# Patient Record
Sex: Female | Born: 1945 | Race: Black or African American | Hispanic: No | Smoking: Former smoker
Health system: Southern US, Community
[De-identification: ages and names within clinical notes are randomized; demographics above are authoritative.]

## PROBLEM LIST (undated history)

## (undated) DIAGNOSIS — S638X2A Sprain of other part of left wrist and hand, initial encounter: Secondary | ICD-10-CM

## (undated) DIAGNOSIS — M25461 Effusion, right knee: Secondary | ICD-10-CM

## (undated) DIAGNOSIS — R630 Anorexia: Secondary | ICD-10-CM

## (undated) DIAGNOSIS — M7989 Other specified soft tissue disorders: Secondary | ICD-10-CM

## (undated) DIAGNOSIS — A809 Acute poliomyelitis, unspecified: Secondary | ICD-10-CM

## (undated) DIAGNOSIS — W19XXXA Unspecified fall, initial encounter: Secondary | ICD-10-CM

## (undated) DIAGNOSIS — M25569 Pain in unspecified knee: Secondary | ICD-10-CM

## (undated) DIAGNOSIS — D649 Anemia, unspecified: Secondary | ICD-10-CM

## (undated) DIAGNOSIS — I10 Essential (primary) hypertension: Secondary | ICD-10-CM

## (undated) DIAGNOSIS — L0390 Cellulitis, unspecified: Secondary | ICD-10-CM

## (undated) DIAGNOSIS — R9431 Abnormal electrocardiogram [ECG] [EKG]: Secondary | ICD-10-CM

## (undated) DIAGNOSIS — I872 Venous insufficiency (chronic) (peripheral): Secondary | ICD-10-CM

## (undated) DIAGNOSIS — D509 Iron deficiency anemia, unspecified: Secondary | ICD-10-CM

## (undated) DIAGNOSIS — S5290XA Unspecified fracture of unspecified forearm, initial encounter for closed fracture: Secondary | ICD-10-CM

## (undated) DIAGNOSIS — I309 Acute pericarditis, unspecified: Secondary | ICD-10-CM

## (undated) DIAGNOSIS — R4189 Other symptoms and signs involving cognitive functions and awareness: Secondary | ICD-10-CM

## (undated) DIAGNOSIS — R221 Localized swelling, mass and lump, neck: Secondary | ICD-10-CM

## (undated) DIAGNOSIS — G8929 Other chronic pain: Secondary | ICD-10-CM

## (undated) DIAGNOSIS — S52509A Unspecified fracture of the lower end of unspecified radius, initial encounter for closed fracture: Secondary | ICD-10-CM

## (undated) DIAGNOSIS — M1990 Unspecified osteoarthritis, unspecified site: Secondary | ICD-10-CM

## (undated) DIAGNOSIS — R600 Localized edema: Secondary | ICD-10-CM

## (undated) DIAGNOSIS — E43 Unspecified severe protein-calorie malnutrition: Secondary | ICD-10-CM

## (undated) DIAGNOSIS — E119 Type 2 diabetes mellitus without complications: Secondary | ICD-10-CM

## (undated) DIAGNOSIS — S52613A Displaced fracture of unspecified ulna styloid process, initial encounter for closed fracture: Secondary | ICD-10-CM

## (undated) DIAGNOSIS — E162 Hypoglycemia, unspecified: Secondary | ICD-10-CM

## (undated) HISTORY — DX: Localized edema: R60.0

## (undated) HISTORY — DX: Iron deficiency anemia, unspecified: D50.9

## (undated) HISTORY — DX: Sprain of other part of left wrist and hand, initial encounter: S63.8X2A

## (undated) HISTORY — DX: Anemia, unspecified: D64.9

## (undated) HISTORY — DX: Acute pericarditis, unspecified: I30.9

## (undated) HISTORY — DX: Unspecified fracture of unspecified forearm, initial encounter for closed fracture: S52.90XA

## (undated) HISTORY — DX: Unspecified fracture of the lower end of unspecified radius, initial encounter for closed fracture: S52.509A

## (undated) HISTORY — DX: Abnormal electrocardiogram (ECG) (EKG): R94.31

## (undated) HISTORY — DX: Venous insufficiency (chronic) (peripheral): I87.2

## (undated) HISTORY — DX: Unspecified fall, initial encounter: W19.XXXA

## (undated) HISTORY — DX: Pain in unspecified knee: M25.569

## (undated) HISTORY — DX: Unspecified osteoarthritis, unspecified site: M19.90

## (undated) HISTORY — DX: Anorexia: R63.0

## (undated) HISTORY — DX: Type 2 diabetes mellitus without complications: E11.9

## (undated) HISTORY — DX: Other specified soft tissue disorders: M79.89

## (undated) HISTORY — DX: Displaced fracture of unspecified ulna styloid process, initial encounter for closed fracture: S52.613A

## (undated) HISTORY — DX: Unspecified severe protein-calorie malnutrition: E43

## (undated) HISTORY — DX: Hypoglycemia, unspecified: E16.2

## (undated) HISTORY — DX: Localized swelling, mass and lump, neck: R22.1

## (undated) HISTORY — DX: Effusion, right knee: M25.461

## (undated) HISTORY — DX: Other chronic pain: G89.29

## (undated) HISTORY — DX: Cellulitis, unspecified: L03.90

## (undated) HISTORY — PX: ABDOMINAL HYSTERECTOMY: SHX81

## (undated) HISTORY — DX: Other symptoms and signs involving cognitive functions and awareness: R41.89

---

## 1998-02-28 ENCOUNTER — Encounter: Admission: RE | Admit: 1998-02-28 | Discharge: 1998-05-29 | Payer: Self-pay | Admitting: Anesthesiology

## 1998-06-06 ENCOUNTER — Ambulatory Visit (HOSPITAL_COMMUNITY): Admission: RE | Admit: 1998-06-06 | Discharge: 1998-06-06 | Payer: Self-pay | Admitting: *Deleted

## 1999-06-26 ENCOUNTER — Ambulatory Visit: Admission: RE | Admit: 1999-06-26 | Discharge: 1999-06-26 | Payer: Self-pay | Admitting: Emergency Medicine

## 1999-06-26 ENCOUNTER — Emergency Department (HOSPITAL_COMMUNITY): Admission: EM | Admit: 1999-06-26 | Discharge: 1999-06-26 | Payer: Self-pay | Admitting: Emergency Medicine

## 2005-05-26 ENCOUNTER — Emergency Department (HOSPITAL_COMMUNITY): Admission: EM | Admit: 2005-05-26 | Discharge: 2005-05-26 | Payer: Self-pay | Admitting: Family Medicine

## 2006-01-10 ENCOUNTER — Encounter: Admission: RE | Admit: 2006-01-10 | Discharge: 2006-01-10 | Payer: Self-pay | Admitting: Otolaryngology

## 2008-02-18 ENCOUNTER — Encounter
Admission: RE | Admit: 2008-02-18 | Discharge: 2008-02-18 | Payer: Self-pay | Admitting: Physical Medicine and Rehabilitation

## 2008-11-13 ENCOUNTER — Emergency Department (HOSPITAL_COMMUNITY): Admission: EM | Admit: 2008-11-13 | Discharge: 2008-11-13 | Payer: Self-pay | Admitting: Emergency Medicine

## 2008-12-30 ENCOUNTER — Encounter (HOSPITAL_BASED_OUTPATIENT_CLINIC_OR_DEPARTMENT_OTHER): Admission: RE | Admit: 2008-12-30 | Discharge: 2009-02-10 | Payer: Self-pay | Admitting: General Surgery

## 2009-01-04 ENCOUNTER — Ambulatory Visit: Payer: Self-pay | Admitting: Vascular Surgery

## 2009-02-17 ENCOUNTER — Emergency Department (HOSPITAL_COMMUNITY): Admission: EM | Admit: 2009-02-17 | Discharge: 2009-02-17 | Payer: Self-pay | Admitting: Emergency Medicine

## 2010-09-30 ENCOUNTER — Encounter: Payer: Self-pay | Admitting: Otolaryngology

## 2010-11-16 ENCOUNTER — Encounter (HOSPITAL_BASED_OUTPATIENT_CLINIC_OR_DEPARTMENT_OTHER): Payer: Medicare Other | Attending: General Surgery

## 2010-11-16 DIAGNOSIS — J301 Allergic rhinitis due to pollen: Secondary | ICD-10-CM | POA: Insufficient documentation

## 2010-11-16 DIAGNOSIS — E119 Type 2 diabetes mellitus without complications: Secondary | ICD-10-CM | POA: Insufficient documentation

## 2010-11-16 DIAGNOSIS — I10 Essential (primary) hypertension: Secondary | ICD-10-CM | POA: Insufficient documentation

## 2010-11-16 DIAGNOSIS — Z79899 Other long term (current) drug therapy: Secondary | ICD-10-CM | POA: Insufficient documentation

## 2010-11-16 DIAGNOSIS — Z86718 Personal history of other venous thrombosis and embolism: Secondary | ICD-10-CM | POA: Insufficient documentation

## 2010-11-16 DIAGNOSIS — M1710 Unilateral primary osteoarthritis, unspecified knee: Secondary | ICD-10-CM | POA: Insufficient documentation

## 2010-11-16 DIAGNOSIS — L97209 Non-pressure chronic ulcer of unspecified calf with unspecified severity: Secondary | ICD-10-CM | POA: Insufficient documentation

## 2010-11-21 ENCOUNTER — Encounter (HOSPITAL_COMMUNITY)
Admission: RE | Admit: 2010-11-21 | Discharge: 2010-11-21 | Disposition: A | Discharge status: Home / Self Care | Payer: Medicare Other | Source: Ambulatory Visit | Attending: General Surgery | Admitting: General Surgery

## 2010-11-23 ENCOUNTER — Encounter (INDEPENDENT_AMBULATORY_CARE_PROVIDER_SITE_OTHER): Payer: Medicare Other

## 2010-11-23 DIAGNOSIS — L97909 Non-pressure chronic ulcer of unspecified part of unspecified lower leg with unspecified severity: Secondary | ICD-10-CM

## 2010-12-04 NOTE — Procedures (Unsigned)
DUPLEX DEEP VENOUS EXAM - LOWER EXTREMITY  INDICATION:  Left lower extremity nonhealing ulcer.  HISTORY:  Edema:  No. Trauma/Surgery:  No. Pain:  No. PE:  No. Previous DVT:  Yes, 30 years ago. Anticoagulants:  No. Other:  Diabetic.  DUPLEX EXAM:               CFV   SFV   PopV  PTV    GSV               R  L  R  L  R  L  R   L  R  L Thrombosis    o  o  o  o  o  o  o   o  o  o Spontaneous   +  +  +  +  +  +  +   +  +  + Phasic        +  +  +  +  +  +  +   +  +  + Augmentation  +  +  +  +  +  +  +   +  +  + Compressible  +  +  +  +  +  +  +   +  +  + Competent     o  o  +  +  +  +  +   +  +  +  Legend:  + - yes  o - no  p - partial  D - decreased  IMPRESSION: 1. No evidence of deep venous thrombosis bilaterally. 2. 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   _____________________________ Larina Earthly, M.D.  EM/MEDQ  D:  11/23/2010  T:  11/23/2010  Job:  [id number]

## 2010-12-13 ENCOUNTER — Encounter (HOSPITAL_BASED_OUTPATIENT_CLINIC_OR_DEPARTMENT_OTHER): Payer: Medicare Other | Attending: General Surgery

## 2010-12-13 DIAGNOSIS — E119 Type 2 diabetes mellitus without complications: Secondary | ICD-10-CM | POA: Insufficient documentation

## 2010-12-13 DIAGNOSIS — Z86718 Personal history of other venous thrombosis and embolism: Secondary | ICD-10-CM | POA: Insufficient documentation

## 2010-12-13 DIAGNOSIS — J301 Allergic rhinitis due to pollen: Secondary | ICD-10-CM | POA: Insufficient documentation

## 2010-12-13 DIAGNOSIS — Z79899 Other long term (current) drug therapy: Secondary | ICD-10-CM | POA: Insufficient documentation

## 2010-12-13 DIAGNOSIS — M1710 Unilateral primary osteoarthritis, unspecified knee: Secondary | ICD-10-CM | POA: Insufficient documentation

## 2010-12-13 DIAGNOSIS — I10 Essential (primary) hypertension: Secondary | ICD-10-CM | POA: Insufficient documentation

## 2010-12-13 DIAGNOSIS — L97209 Non-pressure chronic ulcer of unspecified calf with unspecified severity: Secondary | ICD-10-CM | POA: Insufficient documentation

## 2011-01-22 NOTE — Assessment & Plan Note (Signed)
[facility]   NAME:  [name], [name]             ACCOUNT NO.:  [id number]   MEDICAL RECORD NO.:  [id number]      DATE OF BIRTH:  02/12/1946   PHYSICIAN:  Leonie Man, M.D.    VISIT DATE:  01/09/2009                                   OFFICE VISIT   PROBLEM:  Venous stasis disease, anterior left lower extremity.  Most  recent therapy was with a regular Profore giving her 30-40 mmHg  pressure.  The patient was sent for duplex deep venous exam of lower  extremity on the left, which shows no evidence of deep or superficial  vein thrombosis in bilateral lower extremities and no sonographic  evidence of incompetent perforators or right superficial vein  incompetency.   On examination today, the patient is afebrile, temperature 98.3, pulse  73, and respirations 20, blood pressure 113/74.  Capillary blood glucose  110.  The wound on the anterior leg is now healed.  In the absence of  any venous reflux, I think we will just go ahead and start treating her  with continued compression.  I have given her a prescription for Jobst  venous stockings 20-30 mmHg pressure to be worn during the daytime.  Follow up here will be p.r.n.      Leonie Man, M.D.  Electronically Signed     PB/MEDQ  D:  01/09/2009  T:  01/10/2009  Job:  [id number]

## 2011-01-22 NOTE — Assessment & Plan Note (Signed)
[facility]   NAME:  Janet, [name]             ACCOUNT NO.:  [id number]   MEDICAL RECORD NO.:  [id number]      DATE OF BIRTH:  09-27-1945   PHYSICIAN:  Leonie Man, M.D.    VISIT DATE:  01/02/2009                                   OFFICE VISIT   PROBLEM:  Venous stasis disease of the left lower extremity.  The  patient is a 65 year old diabetic female who noted blisters and swelling  of her left lower extremity greater than the right.  This has been  treated with topical ointments, probably Neosporin and 4 x 4 pads.  The  patient is referred for evaluation at the [facility] by self referral.   CURRENT MEDICATIONS:  1. Gabapentin 300 mg b.i.d.  2. Micardis 80/25 one p.o. daily.  3. Amlodipine 10 mg daily.  4. Metformin 500 mg b.i.d.  5. Singulair 10 mg p.r.n.  6. Ciprofloxacin 750 mg b.i.d.  7. Hydrocodone 7.5/500 p.r.n.   DRUG AND SUBSTANCE ALLERGIES:  The patient describes a LATEX allergy  which she says her tongue swells and she becomes short of breath with  itching.  She also describes a PENICILLIN allergy with the same symptoms  and allergies to STRAWBERRIES, PINEAPPLES, and PEACHES.   KNOWN CHRONIC MEDICAL DISEASES:  The patient has a history of  dyslipidemia, history of a blood clot in her left leg some 17 years ago,  not subsequently treated with any anticoagulants.  She is status post  hysterectomy some 30 years ago and she has had her right knee  arthroscopy in the past.   Her review of systems is otherwise negative except for her history of  hypertension and diabetes and dyslipidemia as noted above.   PHYSICAL EXAMINATION:  GENERAL:  This is a well-developed, well-  nourished female.  VITAL SIGNS:  She is afebrile with temperature 98.4, pulse is 99,  respirations 20, blood pressure 128/80, capillary blood glucose today is  109.  HEENT/NECK:  Head is normocephalic.  Pupils are round and regular.  No  scleral  icterus.  No nasopharyngeal obstructions.  Oropharynx is benign  with moist mucosa.  Neck is supple.  No thyromegaly.  No adenopathy.  CHEST:  Clear to auscultation bilaterally.  HEART:  Regular rate and rhythm without murmurs.  ABDOMEN:  Soft, nontender, and nondistended.  Bowel sounds normoactive.  There are no palpable masses or hernias.  EXTREMITIES:  The left lower extremity is swollen to a larger extent  than the right with no actual skin ulceration, but an area of darkened  and compromised skin over the left anterolateral leg which measures 8.5  x 7.0 x 0.1 cm in size.  Pulses are bounding in the dorsalis pedis and  posterior tibial of both legs.  The patient does exhibit some mild  peripheral neuropathy both on vibratory testing and on filament testing.   ASSESSMENT:  Venous stasis disease of the leg bilateral with near  ulceration of the anterolateral aspect of the left lower extremity.   PLAN:  Plan is for a Profore wrap to the left lower extremity which will  be reevaluated in 1 week and have all of her compression stockings at 30-  40 mmHg of pressure extending from the  toes up to the below the knees  for the right lower extremity.  I will follow up with Ms. [name] in 1 week.      Leonie Man, M.D.  Electronically Signed     PB/MEDQ  D:  01/02/2009  T:  01/02/2009  Job:  [id number]

## 2011-01-22 NOTE — Procedures (Signed)
DUPLEX DEEP VENOUS EXAM - LOWER EXTREMITY   INDICATION:  Left lower extremity nonhealing wound.   HISTORY:  Edema:  Yes.  Trauma/Surgery:  Nonhealing wound.  Pain:  Yes.  PE:  No.  Previous DVT:  No.  Anticoagulants:  No.  Other:   DUPLEX EXAM:                CFV   SFV   PopV  PTV    GSV                R  L  R  L  R  L  R   L  R  L  Thrombosis    o  o  o     o     o      o  Spontaneous   +  +  +     +     +      +  Phasic        +  +  +     +     +      +  Augmentation  +  +  +     +     +      +  Compressible  +  +  +     +     +      +  Competent     +  +  +     +     +      +   Legend:  + - yes  o - no  p - partial  D - decreased   IMPRESSION:  1. No evidence of deep or superficial vein thrombosis noted in      bilateral lower extremities.  2. No sonographic evidence of incompetent perforator in lower      extremities.  3. Bilateral lower extremity deep and superficial veins demonstrate      competency.    _____________________________  Larina Earthly, M.D.   AC/MEDQ  D:  01/04/2009  T:  01/04/2009  Job:  [id number]

## 2012-04-07 ENCOUNTER — Encounter (HOSPITAL_COMMUNITY): Payer: Self-pay | Admitting: Emergency Medicine

## 2012-04-07 DIAGNOSIS — I10 Essential (primary) hypertension: Secondary | ICD-10-CM | POA: Insufficient documentation

## 2012-04-07 DIAGNOSIS — M7989 Other specified soft tissue disorders: Principal | ICD-10-CM | POA: Insufficient documentation

## 2012-04-07 DIAGNOSIS — E119 Type 2 diabetes mellitus without complications: Secondary | ICD-10-CM | POA: Insufficient documentation

## 2012-04-07 NOTE — ED Notes (Signed)
PT. REPORTS PROGRESSING LEFT LOWER LEG EDEMA/SWELLING FOR 6 DAYS , DENIES INJURY OR FALL .

## 2012-04-08 ENCOUNTER — Encounter (HOSPITAL_COMMUNITY): Payer: Self-pay | Admitting: Emergency Medicine

## 2012-04-08 ENCOUNTER — Observation Stay (HOSPITAL_COMMUNITY)
Admission: EM | Admit: 2012-04-08 | Discharge: 2012-04-08 | Disposition: A | Discharge status: Home / Self Care | Payer: Medicare Other | Attending: Emergency Medicine | Admitting: Emergency Medicine

## 2012-04-08 DIAGNOSIS — R609 Edema, unspecified: Secondary | ICD-10-CM

## 2012-04-08 DIAGNOSIS — R600 Localized edema: Secondary | ICD-10-CM

## 2012-04-08 DIAGNOSIS — M7989 Other specified soft tissue disorders: Secondary | ICD-10-CM

## 2012-04-08 DIAGNOSIS — D649 Anemia, unspecified: Secondary | ICD-10-CM

## 2012-04-08 HISTORY — DX: Essential (primary) hypertension: I10

## 2012-04-08 LAB — CBC WITH DIFFERENTIAL/PLATELET
Eosinophils Absolute: 0.1 10*3/uL (ref 0.0–0.7)
Eosinophils Relative: 2 % (ref 0–5)
HCT: 32.7 % — ABNORMAL LOW (ref 36.0–46.0)
Hemoglobin: 10.3 g/dL — ABNORMAL LOW (ref 12.0–15.0)
Lymphocytes Relative: 35 % (ref 12–46)
MCHC: 31.5 g/dL (ref 30.0–36.0)
MCV: 71.6 fL — ABNORMAL LOW (ref 78.0–100.0)
Monocytes Absolute: 0.5 10*3/uL (ref 0.1–1.0)
Neutrophils Relative %: 54 % (ref 43–77)
RBC: 4.57 MIL/uL (ref 3.87–5.11)
RDW: 15.7 % — ABNORMAL HIGH (ref 11.5–15.5)
WBC: 5.2 10*3/uL (ref 4.0–10.5)

## 2012-04-08 LAB — BASIC METABOLIC PANEL
BUN: 11 mg/dL (ref 6–23)
CO2: 31 mEq/L (ref 19–32)
Chloride: 104 mEq/L (ref 96–112)
Glucose, Bld: 78 mg/dL (ref 70–99)
Potassium: 4 mEq/L (ref 3.5–5.1)

## 2012-04-08 LAB — PROTIME-INR: Prothrombin Time: 14.2 seconds (ref 11.6–15.2)

## 2012-04-08 LAB — URINALYSIS, ROUTINE W REFLEX MICROSCOPIC
Bilirubin Urine: NEGATIVE
Hgb urine dipstick: NEGATIVE
Ketones, ur: NEGATIVE mg/dL
Nitrite: NEGATIVE
Protein, ur: NEGATIVE mg/dL
pH: 7.5 (ref 5.0–8.0)

## 2012-04-08 LAB — HEPATIC FUNCTION PANEL
ALT: 8 U/L (ref 0–35)
Albumin: 3.6 g/dL (ref 3.5–5.2)

## 2012-04-08 LAB — APTT: aPTT: 34 seconds (ref 24–37)

## 2012-04-08 LAB — URINE MICROSCOPIC-ADD ON

## 2012-04-08 MED ORDER — ACETAMINOPHEN 325 MG PO TABS: 650.0000 mg | ORAL_TABLET | ORAL | Status: DC | PRN

## 2012-04-08 MED ORDER — ENOXAPARIN SODIUM 150 MG/ML ~~LOC~~ SOLN
1.0000 mg/kg | Freq: Once | SUBCUTANEOUS | Status: AC
  Administered 2012-04-08: 80 mg via SUBCUTANEOUS
  Filled 2012-04-08: qty 1

## 2012-04-08 MED ORDER — MORPHINE SULFATE 4 MG/ML IJ SOLN: 4.0000 mg | INTRAMUSCULAR | Status: DC | PRN

## 2012-04-08 MED ORDER — HYDROCODONE-ACETAMINOPHEN 5-325 MG PO TABS: 1.0000 | ORAL_TABLET | Freq: Four times a day (QID) | ORAL | Status: AC | PRN

## 2012-04-08 NOTE — Progress Notes (Signed)
VASCULAR LAB PRELIMINARY  PRELIMINARY  PRELIMINARY  PRELIMINARY  Bilateral lower extremity venous duplex  completed.    Preliminary report:  Bilateral:  No evidence of DVT, superficial thrombosis, or Baker's Cyst.  Enlarged lymph nodes noted in bilateral groin.    Merridy Pascoe, RVT 04/08/2012, 8:36 AM

## 2012-04-08 NOTE — ED Notes (Signed)
Report received, assumed care.  

## 2012-04-08 NOTE — ED Provider Notes (Signed)
History     CSN: [id number]  Arrival date & time 04/07/12  2302   First MD Initiated Contact with Patient 04/08/12 [phone]      Chief Complaint  Patient presents with  . Leg Swelling    (Consider location/radiation/quality/duration/timing/severity/associated sxs/prior treatment) HPI Comments: Pt has hx of DM, Htn, prior DVT many years ago and a vascular ulcer to her LLE that healed last year.  She has has several days of progressive swelling of the bil LE's L > R.  No injuryies, no sob, no cp, sx are constant, gradually worsening and not associated with f/c/nv. Not on blood thinners  The history is provided by the patient.    Past Medical History  Diagnosis Date  . Hypertension   . Diabetes mellitus     Past Surgical History  Procedure Date  . Abdominal hysterectomy     No family history on file.  History  Substance Use Topics  . Smoking status: Never Smoker   . Smokeless tobacco: Not on file  . Alcohol Use: No    OB History    Grav Para Term Preterm Abortions TAB SAB Ect Mult Living                  Review of Systems  All other systems reviewed and are negative.    Allergies  Penicillins  Home Medications   Current Outpatient Rx  Name Route Sig Dispense Refill  . AMLODIPINE BESYLATE 10 MG PO TABS Oral Take 10 mg by mouth every evening.    Marland Kitchen HYDROCODONE-ACETAMINOPHEN 5-500 MG PO CAPS Oral Take 1 capsule by mouth every 6 (six) hours as needed. Knee pain    . METFORMIN HCL 500 MG PO TABS Oral Take 500 mg by mouth 2 (two) times daily with a meal.    . TELMISARTAN-HCTZ 80-25 MG PO TABS Oral Take 1 tablet by mouth daily.      BP 110/70  Pulse 72  Temp 97.6 F (36.4 C) (Oral)  Resp 20  SpO2 100%  Physical Exam  Nursing note and vitals reviewed. Constitutional: She appears well-developed and well-nourished. No distress.  HENT:  Head: Normocephalic and atraumatic.  Mouth/Throat: Oropharynx is clear and moist. No oropharyngeal exudate.  Eyes:  Conjunctivae and EOM are normal. Pupils are equal, round, and reactive to light. Right eye exhibits no discharge. Left eye exhibits no discharge. No scleral icterus.  Neck: Normal range of motion. Neck supple. No JVD present. No thyromegaly present.  Cardiovascular: Normal rate, regular rhythm, normal heart sounds and intact distal pulses.  Exam reveals no gallop and no friction rub.   No murmur heard. Pulmonary/Chest: Effort normal and breath sounds normal. No respiratory distress. She has no wheezes. She has no rales.  Abdominal: Soft. Bowel sounds are normal. She exhibits no distension and no mass. There is no tenderness.  Musculoskeletal: Normal range of motion. She exhibits edema ( bil LE edema L>R.  pulses palpated at the bil DP's.). She exhibits no tenderness.  Lymphadenopathy:    She has no cervical adenopathy.  Neurological: She is alert. Coordination normal.       Normal strength and sensation of the Bil LE's.    Skin: Skin is warm and dry. No rash noted. No erythema.  Psychiatric: She has a normal mood and affect. Her behavior is normal.    ED Course  Procedures (including critical care time)  Labs Reviewed  CBC WITH DIFFERENTIAL - Abnormal; Notable for the following:    Hemoglobin 10.3 (*)  HCT 32.7 (*)     MCV 71.6 (*)     MCH 22.5 (*)     RDW 15.7 (*)     All other components within normal limits  BASIC METABOLIC PANEL - Abnormal; Notable for the following:    GFR calc non Af Amer 88 (*)     All other components within normal limits  PRO B NATRIURETIC PEPTIDE  HEPATIC FUNCTION PANEL  URINALYSIS, ROUTINE W REFLEX MICROSCOPIC  APTT  PROTIME-INR   No results found.   No diagnosis found.    MDM  Clear heart and lung sounds, abdomen is soft and her bilateral lower extremities,  has swelling left greater than right. Will place on observational protocol, Lovenox ordered, ultrasound ordered for the morning.  She appears clinically stable without any hypoxia, chest  pain or shortness of breath. Her EKG is normal with no signs of ischemia and no changes from prior. Observational unit orders given  ED ECG REPORT  I personally interpreted this EKG   Date: 04/08/2012   Rate: 61  Rhythm: normal sinus rhythm  QRS Axis: normal  Intervals: normal  ST/T Wave abnormalities: normal  Conduction Disutrbances:none  Narrative Interpretation:   Old EKG Reviewed: No prior EKG for comparison  Change of shift - care signed out to Dr. Rubin Payor and Trixie Dredge PA-C       Vida Roller, MD 04/08/12 [phone]

## 2012-04-08 NOTE — ED Provider Notes (Signed)
8:57 AM Patient signed out to me by Dr Hyacinth Meeker at change of shift.  Patient with peripheral edema, pending doppler US this morning.  Doppler is negative.  Discussed results with patient.  Pt with bilateral peripheral edema, distal pulses intact, no erythema or warmth.  PCP is Dr Lowella Bandy.  Plan is for discharge.  Pt verbalizes understanding and agrees with plan.  Return precautions given.    [place], [place] 04/08/12 [phone]

## 2012-04-09 NOTE — ED Provider Notes (Signed)
Medical screening examination/treatment/procedure(s) were conducted as a shared visit with non-physician practitioner(s) and myself.  I personally evaluated the patient during the encounter  Please see my separate respective documentation pertaining to this patient encounter   Vida Roller, MD 04/09/12 [phone]

## 2014-05-12 ENCOUNTER — Encounter (HOSPITAL_COMMUNITY): Payer: Self-pay | Admitting: Emergency Medicine

## 2014-05-12 ENCOUNTER — Emergency Department (HOSPITAL_COMMUNITY)
Admission: EM | Admit: 2014-05-12 | Discharge: 2014-05-12 | Disposition: A | Discharge status: Home / Self Care | Payer: Medicare Other | Attending: Emergency Medicine | Admitting: Emergency Medicine

## 2014-05-12 DIAGNOSIS — I10 Essential (primary) hypertension: Secondary | ICD-10-CM | POA: Insufficient documentation

## 2014-05-12 DIAGNOSIS — L03115 Cellulitis of right lower limb: Secondary | ICD-10-CM

## 2014-05-12 DIAGNOSIS — L02419 Cutaneous abscess of limb, unspecified: Secondary | ICD-10-CM | POA: Diagnosis not present

## 2014-05-12 DIAGNOSIS — M79609 Pain in unspecified limb: Secondary | ICD-10-CM | POA: Insufficient documentation

## 2014-05-12 DIAGNOSIS — Z880 Allergy status to penicillin: Secondary | ICD-10-CM | POA: Insufficient documentation

## 2014-05-12 DIAGNOSIS — E119 Type 2 diabetes mellitus without complications: Secondary | ICD-10-CM | POA: Insufficient documentation

## 2014-05-12 DIAGNOSIS — L03119 Cellulitis of unspecified part of limb: Principal | ICD-10-CM

## 2014-05-12 DIAGNOSIS — Z79899 Other long term (current) drug therapy: Secondary | ICD-10-CM | POA: Diagnosis not present

## 2014-05-12 DIAGNOSIS — M7989 Other specified soft tissue disorders: Secondary | ICD-10-CM

## 2014-05-12 LAB — BASIC METABOLIC PANEL
ANION GAP: 11 (ref 5–15)
BUN: 10 mg/dL (ref 6–23)
CHLORIDE: 103 meq/L (ref 96–112)
CO2: 27 meq/L (ref 19–32)
CREATININE: 0.7 mg/dL (ref 0.50–1.10)
Calcium: 9.2 mg/dL (ref 8.4–10.5)
GFR calc Af Amer: >90 mL/min (ref >90)
GFR calc non Af Amer: 86 mL/min — ABNORMAL LOW (ref >90)
GLUCOSE: 80 mg/dL (ref 70–99)
Potassium: 4.2 mEq/L (ref 3.7–5.3)
SODIUM: 141 meq/L (ref 137–147)

## 2014-05-12 LAB — CBC WITH DIFFERENTIAL/PLATELET
BASOS ABS: 0 10*3/uL (ref 0.0–0.1)
Basophils Relative: 0 % (ref 0–1)
Eosinophils Absolute: 0.1 10*3/uL (ref 0.0–0.7)
Eosinophils Relative: 1 % (ref 0–5)
HCT: 34.2 % — ABNORMAL LOW (ref 36.0–46.0)
HEMOGLOBIN: 10.6 g/dL — AB (ref 12.0–15.0)
Lymphocytes Relative: 29 % (ref 12–46)
Lymphs Abs: 1.7 10*3/uL (ref 0.7–4.0)
MCH: 22.5 pg — AB (ref 26.0–34.0)
MCHC: 31 g/dL (ref 30.0–36.0)
MCV: 72.6 fL — ABNORMAL LOW (ref 78.0–100.0)
MONO ABS: 0.5 10*3/uL (ref 0.1–1.0)
Monocytes Relative: 9 % (ref 3–12)
NEUTROS PCT: 62 % (ref 43–77)
Neutro Abs: 3.6 10*3/uL (ref 1.7–7.7)
PLATELETS: 205 10*3/uL (ref 150–400)
RBC: 4.71 MIL/uL (ref 3.87–5.11)
RDW: 15.1 % (ref 11.5–15.5)
WBC: 5.9 10*3/uL (ref 4.0–10.5)

## 2014-05-12 MED ORDER — LEVOFLOXACIN 750 MG PO TABS: 750.0000 mg | ORAL_TABLET | Freq: Every day | ORAL | Status: DC

## 2014-05-12 MED ORDER — LEVOFLOXACIN IN D5W 500 MG/100ML IV SOLN
500.0000 mg | Freq: Once | INTRAVENOUS | Status: AC
  Administered 2014-05-12: 500 mg via INTRAVENOUS
  Filled 2014-05-12 (×2): qty 100

## 2014-05-12 MED ORDER — LEVOFLOXACIN IN D5W 500 MG/100ML IV SOLN
500.0000 mg | Freq: Once | INTRAVENOUS | Status: DC
  Filled 2014-05-12: qty 100

## 2014-05-12 NOTE — Progress Notes (Signed)
VASCULAR LAB PRELIMINARY  PRELIMINARY  PRELIMINARY  PRELIMINARY  Bilateral lower extremity venous duplex completed.    Preliminary report:  Bilateral:  No evidence of DVT, superficial thrombosis, or Baker's Cyst.   Kyan Giannone, RVS 05/12/2014, 3:13 PM

## 2014-05-12 NOTE — ED Notes (Signed)
Pt back from vascular lab; ED PA at bedside

## 2014-05-12 NOTE — ED Notes (Signed)
Pt in vascular lab

## 2014-05-12 NOTE — Discharge Instructions (Signed)
Take the prescribed medication as directed. Follow-up with your primary care physician for re-check. Return to the ED for new or worsening symptoms-- increased swelling, redness, high fever, inability to walk, etc.

## 2014-05-12 NOTE — ED Notes (Signed)
Pt c/o bilateral leg swelling with redness and increased pain to right leg x 3 days; pt sent here by PCP to r/o DVT

## 2014-05-12 NOTE — ED Provider Notes (Signed)
Pt signed out to me from Quincy Carnes, PA-C who was seeing this patient along with Dr. Dorie Rank, MD  Pt is a 69 yof with hx of swelling and redness in RL extremity.  Pt seen by PCP with concern for DVT.  On exam, pt is afebrile and nontoxic.  Bilateral lower extremity venous duplex performed and negative for DVT or superficial thrombosis.  R lower extremity consistent with cellulitis on exam.  No cp, SOB, palpitations, dizziness, weakness.    Pt undergoing IV levaquin.    Plan: D/c pt s/p IV Levaquin with OP tx of PO levaquin.    Signed,  Dahlia Bailiff, PA-C 4:51 PM       Carrie Mew, PA-C 05/13/14 [place], PA-C 05/13/14 [phone]

## 2014-05-12 NOTE — ED Provider Notes (Signed)
Medical screening examination/treatment/procedure(s) were conducted as a shared visit with non-physician practitioner(s) and myself.  I personally evaluated the patient during the encounter.  Patient presented emergency room with lower extremity swelling and redness. Doppler ultrasounds were negative. Check her labs but anticipate sending her home with oral antibiotics for her cellulitis.   Dorie Rank, MD 05/12/14 [phone]

## 2014-05-12 NOTE — ED Provider Notes (Signed)
CSN: [id number]     Arrival date & time 05/12/14  1322 History   First MD Initiated Contact with Patient 05/12/14 1448     Chief Complaint  Patient presents with  . Leg Pain     (Consider location/radiation/quality/duration/timing/severity/associated sxs/prior Treatment) Patient is a 68 y.o. female presenting with leg pain. The history is provided by the patient and medical records.  Leg Pain  This is a 68 year old female with past medical history significant for hypertension and diabetes, presenting to the ED for lower extremity edema and erythema.  Patient states she has some pitting edema of her lower extremities at baseline, but is again increased for the past 3 days, more noticeably in her right lower leg. She does endorse some erythema and warmth to touch as well as pain of her right leg. She endorses subjective fever, afebrile here. Denies fever, chills, nausea, or vomiting. No chest pain, shortness of breath, palpitations, dizziness, or weakness. Still able to walk normally, occasionally uses a cane at her baseline.  Patient has remote history of DVT, no longer on anticoagulation. She was seen by her primary care physician earlier today and encouraged to come to the ED for further evaluation.  Past Medical History  Diagnosis Date  . Hypertension   . Diabetes mellitus    Past Surgical History  Procedure Laterality Date  . Abdominal hysterectomy     History reviewed. No pertinent family history. History  Substance Use Topics  . Smoking status: Never Smoker   . Smokeless tobacco: Not on file  . Alcohol Use: No   OB History   Grav Para Term Preterm Abortions TAB SAB Ect Mult Living                 Review of Systems  Cardiovascular: Positive for leg swelling.  Skin: Positive for color change.  All other systems reviewed and are negative.     Allergies  Penicillins  Home Medications   Prior to Admission medications   Medication Sig Start Date End Date Taking?  Authorizing Provider  amLODipine (NORVASC) 10 MG tablet Take 10 mg by mouth every evening.    Historical Provider, MD  hydrocodone-acetaminophen (LORCET-HD) 5-500 MG per capsule Take 1 capsule by mouth every 6 (six) hours as needed. Knee pain    Historical Provider, MD  metFORMIN (GLUCOPHAGE) 500 MG tablet Take 500 mg by mouth 2 (two) times daily with a meal.    Historical Provider, MD  telmisartan-hydrochlorothiazide (MICARDIS HCT) 80-25 MG per tablet Take 1 tablet by mouth daily.    Historical Provider, MD   BP 132/83  Pulse 100  Temp(Src) 98.6 F (37 C) (Oral)  Resp 18  SpO2 100%  Physical Exam  Nursing note and vitals reviewed. Constitutional: She is oriented to person, place, and time. She appears well-developed and well-nourished.  HENT:  Head: Normocephalic and atraumatic.  Mouth/Throat: Oropharynx is clear and moist.  Eyes: Conjunctivae and EOM are normal. Pupils are equal, round, and reactive to light.  Neck: Normal range of motion.  Cardiovascular: Normal rate, regular rhythm and normal heart sounds.   Pulmonary/Chest: Effort normal and breath sounds normal.  Abdominal: Soft. Bowel sounds are normal.  Musculoskeletal: Normal range of motion.  1+ pitting edema BLE; no appreciable calf asymmetry, tenderness, or palpable cords; RLE erythematous and warm when compared with the left, consistent with cellulitis; DP pulses intact bilaterally  Neurological: She is alert and oriented to person, place, and time.  Skin: Skin is warm and dry.  Psychiatric: She has a normal mood and affect.    ED Course  Procedures (including critical care time) Labs Review Labs Reviewed  CBC WITH DIFFERENTIAL - Abnormal; Notable for the following:    Hemoglobin 10.6 (*)    HCT 34.2 (*)    MCV 72.6 (*)    MCH 22.5 (*)    All other components within normal limits  BASIC METABOLIC PANEL    Imaging Review No results found.  VASCULAR LAB  PRELIMINARY PRELIMINARY PRELIMINARY PRELIMINARY   Bilateral lower extremity venous duplex completed.  Preliminary report: Bilateral: No evidence of DVT, superficial thrombosis, or Baker's Cyst.   EKG Interpretation None      MDM   Final diagnoses:  Cellulitis of right lower extremity   68 year old female with lower history swelling and redness. Seen by PCP with concern for DVT. On exam, patient is afebrile and overall nontoxic appearing.  Bilateral lower extremity venous duplex performed which is negative for DVT or superficial thrombosis. Her right lower extremity is warm and erythematous when compared to the left, consistent with cellulitis. Denies chest pain, SOB, palpitations, dizziness, weakness.  Will obtain basic labs. Patient started on IV Levaquin.  Lab work reassuring.  Patient remains afebrile and non-toxic appearing.  Will attempt OP treatment with PO Levaquin.  She will FU closely with PCP for re-check.  Discussed plan with patient, he/she acknowledged understanding and agreed with plan of care.  Return precautions given for new or worsening symptoms.  Case discussed with attending physician, Dr. Tomi Bamberger, who personally evaluated patient and agrees with assessment and plan of care.  Larene Pickett, PA-C 05/12/14 [phone]

## 2014-06-06 ENCOUNTER — Emergency Department (HOSPITAL_COMMUNITY): Payer: Medicare Other

## 2014-06-06 ENCOUNTER — Observation Stay (HOSPITAL_COMMUNITY): Payer: Medicare Other

## 2014-06-06 ENCOUNTER — Observation Stay (HOSPITAL_COMMUNITY)
Admission: EM | Admit: 2014-06-06 | Discharge: 2014-06-08 | Disposition: A | Discharge status: Home / Self Care | Payer: Medicare Other | Attending: Internal Medicine | Admitting: Internal Medicine

## 2014-06-06 ENCOUNTER — Encounter (HOSPITAL_COMMUNITY): Payer: Self-pay | Admitting: Emergency Medicine

## 2014-06-06 DIAGNOSIS — Z87891 Personal history of nicotine dependence: Secondary | ICD-10-CM | POA: Insufficient documentation

## 2014-06-06 DIAGNOSIS — I10 Essential (primary) hypertension: Secondary | ICD-10-CM | POA: Insufficient documentation

## 2014-06-06 DIAGNOSIS — I309 Acute pericarditis, unspecified: Secondary | ICD-10-CM | POA: Insufficient documentation

## 2014-06-06 DIAGNOSIS — M25569 Pain in unspecified knee: Secondary | ICD-10-CM

## 2014-06-06 DIAGNOSIS — E43 Unspecified severe protein-calorie malnutrition: Secondary | ICD-10-CM

## 2014-06-06 DIAGNOSIS — Z86718 Personal history of other venous thrombosis and embolism: Secondary | ICD-10-CM | POA: Insufficient documentation

## 2014-06-06 DIAGNOSIS — G8929 Other chronic pain: Secondary | ICD-10-CM | POA: Diagnosis not present

## 2014-06-06 DIAGNOSIS — R627 Adult failure to thrive: Secondary | ICD-10-CM | POA: Insufficient documentation

## 2014-06-06 DIAGNOSIS — D649 Anemia, unspecified: Secondary | ICD-10-CM | POA: Diagnosis not present

## 2014-06-06 DIAGNOSIS — R220 Localized swelling, mass and lump, head: Secondary | ICD-10-CM | POA: Diagnosis not present

## 2014-06-06 DIAGNOSIS — IMO0002 Reserved for concepts with insufficient information to code with codable children: Secondary | ICD-10-CM

## 2014-06-06 DIAGNOSIS — E049 Nontoxic goiter, unspecified: Secondary | ICD-10-CM | POA: Insufficient documentation

## 2014-06-06 DIAGNOSIS — Z79899 Other long term (current) drug therapy: Secondary | ICD-10-CM | POA: Diagnosis not present

## 2014-06-06 DIAGNOSIS — N390 Urinary tract infection, site not specified: Secondary | ICD-10-CM

## 2014-06-06 DIAGNOSIS — R221 Localized swelling, mass and lump, neck: Secondary | ICD-10-CM

## 2014-06-06 DIAGNOSIS — R509 Fever, unspecified: Secondary | ICD-10-CM

## 2014-06-06 DIAGNOSIS — E46 Unspecified protein-calorie malnutrition: Secondary | ICD-10-CM | POA: Diagnosis not present

## 2014-06-06 DIAGNOSIS — E440 Moderate protein-calorie malnutrition: Secondary | ICD-10-CM

## 2014-06-06 DIAGNOSIS — M1710 Unilateral primary osteoarthritis, unspecified knee: Secondary | ICD-10-CM | POA: Insufficient documentation

## 2014-06-06 DIAGNOSIS — Z880 Allergy status to penicillin: Secondary | ICD-10-CM | POA: Diagnosis not present

## 2014-06-06 DIAGNOSIS — M25561 Pain in right knee: Secondary | ICD-10-CM

## 2014-06-06 DIAGNOSIS — Z8612 Personal history of poliomyelitis: Secondary | ICD-10-CM | POA: Insufficient documentation

## 2014-06-06 DIAGNOSIS — Z91018 Allergy to other foods: Secondary | ICD-10-CM | POA: Insufficient documentation

## 2014-06-06 DIAGNOSIS — D509 Iron deficiency anemia, unspecified: Secondary | ICD-10-CM

## 2014-06-06 DIAGNOSIS — E119 Type 2 diabetes mellitus without complications: Secondary | ICD-10-CM | POA: Insufficient documentation

## 2014-06-06 DIAGNOSIS — R9431 Abnormal electrocardiogram [ECG] [EKG]: Secondary | ICD-10-CM

## 2014-06-06 DIAGNOSIS — E162 Hypoglycemia, unspecified: Secondary | ICD-10-CM

## 2014-06-06 HISTORY — DX: Acute poliomyelitis, unspecified: A80.9

## 2014-06-06 HISTORY — DX: Pain in unspecified knee: M25.569

## 2014-06-06 HISTORY — DX: Other chronic pain: G89.29

## 2014-06-06 LAB — TROPONIN I: Troponin I: <0.3 ng/mL (ref <0.30)

## 2014-06-06 LAB — COMPREHENSIVE METABOLIC PANEL
ALK PHOS: 70 U/L (ref 39–117)
ALT: 6 U/L (ref 0–35)
AST: 12 U/L (ref 0–37)
Albumin: 3.1 g/dL — ABNORMAL LOW (ref 3.5–5.2)
Anion gap: 17 — ABNORMAL HIGH (ref 5–15)
BILIRUBIN TOTAL: 0.9 mg/dL (ref 0.3–1.2)
BUN: 14 mg/dL (ref 6–23)
CHLORIDE: 97 meq/L (ref 96–112)
CO2: 25 meq/L (ref 19–32)
Calcium: 9.4 mg/dL (ref 8.4–10.5)
Creatinine, Ser: 0.56 mg/dL (ref 0.50–1.10)
GLUCOSE: 52 mg/dL — AB (ref 70–99)
POTASSIUM: 3.5 meq/L — AB (ref 3.7–5.3)
SODIUM: 139 meq/L (ref 137–147)
TOTAL PROTEIN: 7.7 g/dL (ref 6.0–8.3)

## 2014-06-06 LAB — URINALYSIS, ROUTINE W REFLEX MICROSCOPIC
Glucose, UA: NEGATIVE mg/dL
Nitrite: NEGATIVE
Protein, ur: NEGATIVE mg/dL
SPECIFIC GRAVITY, URINE: 1.018 (ref 1.005–1.030)
UROBILINOGEN UA: 1 mg/dL (ref 0.0–1.0)
pH: 6 (ref 5.0–8.0)

## 2014-06-06 LAB — URINE MICROSCOPIC-ADD ON

## 2014-06-06 LAB — CBC WITH DIFFERENTIAL/PLATELET
BASOS PCT: 0 % (ref 0–1)
Basophils Absolute: 0 10*3/uL (ref 0.0–0.1)
EOS PCT: 1 % (ref 0–5)
Eosinophils Absolute: 0.1 10*3/uL (ref 0.0–0.7)
HEMATOCRIT: 33.8 % — AB (ref 36.0–46.0)
HEMOGLOBIN: 10.4 g/dL — AB (ref 12.0–15.0)
LYMPHS PCT: 18 % (ref 12–46)
Lymphs Abs: 1.3 10*3/uL (ref 0.7–4.0)
MCH: 22.2 pg — ABNORMAL LOW (ref 26.0–34.0)
MCHC: 30.8 g/dL (ref 30.0–36.0)
MCV: 72.1 fL — ABNORMAL LOW (ref 78.0–100.0)
MONOS PCT: 8 % (ref 3–12)
Monocytes Absolute: 0.6 10*3/uL (ref 0.1–1.0)
NEUTROS ABS: 5.4 10*3/uL (ref 1.7–7.7)
NEUTROS PCT: 73 % (ref 43–77)
Platelets: 259 10*3/uL (ref 150–400)
RBC: 4.69 MIL/uL (ref 3.87–5.11)
RDW: 14.4 % (ref 11.5–15.5)
WBC: 7.4 10*3/uL (ref 4.0–10.5)

## 2014-06-06 LAB — GLUCOSE, CAPILLARY: GLUCOSE-CAPILLARY: 101 mg/dL — AB (ref 70–99)

## 2014-06-06 LAB — VITAMIN B12: Vitamin B-12: 779 pg/mL (ref 211–911)

## 2014-06-06 LAB — FERRITIN: FERRITIN: 493 ng/mL — AB (ref 10–291)

## 2014-06-06 LAB — TSH: TSH: 1.12 u[IU]/mL (ref 0.350–4.500)

## 2014-06-06 LAB — CBG MONITORING, ED
GLUCOSE-CAPILLARY: 43 mg/dL — AB (ref 70–99)
Glucose-Capillary: 80 mg/dL (ref 70–99)

## 2014-06-06 LAB — LACTATE DEHYDROGENASE: LDH: 157 U/L (ref 94–250)

## 2014-06-06 LAB — IRON AND TIBC
Iron: 23 ug/dL — ABNORMAL LOW (ref 42–135)
SATURATION RATIOS: 13 % — AB (ref 20–55)
TIBC: 178 ug/dL — ABNORMAL LOW (ref 250–470)
UIBC: 155 ug/dL (ref 125–400)

## 2014-06-06 MED ORDER — CHLORHEXIDINE GLUCONATE 0.12 % MT SOLN
15.0000 mL | Freq: Two times a day (BID) | OROMUCOSAL | Status: DC
  Administered 2014-06-06 – 2014-06-08 (×2): 15 mL via OROMUCOSAL
  Filled 2014-06-06 (×6): qty 15

## 2014-06-06 MED ORDER — ACETAMINOPHEN 325 MG PO TABS
650.0000 mg | ORAL_TABLET | Freq: Four times a day (QID) | ORAL | Status: DC | PRN
Start: 2014-06-06 — End: 2014-06-08

## 2014-06-06 MED ORDER — SENNA 8.6 MG PO TABS
1.0000 | ORAL_TABLET | Freq: Two times a day (BID) | ORAL | Status: DC
  Administered 2014-06-06 – 2014-06-08 (×4): 8.6 mg via ORAL
  Filled 2014-06-06 (×4): qty 1

## 2014-06-06 MED ORDER — IRBESARTAN 300 MG PO TABS
300.0000 mg | ORAL_TABLET | Freq: Every day | ORAL | Status: DC
  Administered 2014-06-07 – 2014-06-08 (×2): 300 mg via ORAL
  Filled 2014-06-06 (×2): qty 1

## 2014-06-06 MED ORDER — INFLUENZA VAC SPLIT QUAD 0.5 ML IM SUSY
0.5000 mL | PREFILLED_SYRINGE | Freq: Once | INTRAMUSCULAR | Status: DC
  Filled 2014-06-06 (×2): qty 0.5

## 2014-06-06 MED ORDER — DEXTROSE 50 % IV SOLN: 25.0000 mL | Freq: Once | INTRAVENOUS | Status: AC | PRN

## 2014-06-06 MED ORDER — HYDROCHLOROTHIAZIDE 25 MG PO TABS
25.0000 mg | ORAL_TABLET | Freq: Every day | ORAL | Status: DC
  Administered 2014-06-07 – 2014-06-08 (×2): 25 mg via ORAL
  Filled 2014-06-06 (×2): qty 1

## 2014-06-06 MED ORDER — HEPARIN SODIUM (PORCINE) 5000 UNIT/ML IJ SOLN
5000.0000 [IU] | Freq: Three times a day (TID) | INTRAMUSCULAR | Status: DC
  Administered 2014-06-06 – 2014-06-08 (×6): 5000 [IU] via SUBCUTANEOUS
  Filled 2014-06-06 (×8): qty 1

## 2014-06-06 MED ORDER — ENSURE COMPLETE PO LIQD
237.0000 mL | Freq: Two times a day (BID) | ORAL | Status: DC
  Administered 2014-06-07 – 2014-06-08 (×2): 237 mL via ORAL

## 2014-06-06 MED ORDER — MORPHINE SULFATE 4 MG/ML IJ SOLN
4.0000 mg | Freq: Once | INTRAMUSCULAR | Status: AC
  Administered 2014-06-06: 4 mg via INTRAVENOUS
  Filled 2014-06-06: qty 1

## 2014-06-06 MED ORDER — CETYLPYRIDINIUM CHLORIDE 0.05 % MT LIQD
7.0000 mL | Freq: Two times a day (BID) | OROMUCOSAL | Status: DC
Start: 2014-06-07 — End: 2014-06-08

## 2014-06-06 MED ORDER — DOCUSATE SODIUM 100 MG PO CAPS
100.0000 mg | ORAL_CAPSULE | Freq: Two times a day (BID) | ORAL | Status: DC
  Administered 2014-06-06 – 2014-06-08 (×4): 100 mg via ORAL
  Filled 2014-06-06 (×5): qty 1

## 2014-06-06 MED ORDER — ACETAMINOPHEN 650 MG RE SUPP: 650.0000 mg | Freq: Four times a day (QID) | RECTAL | Status: DC | PRN

## 2014-06-06 MED ORDER — POLYETHYLENE GLYCOL 3350 17 G PO PACK
17.0000 g | PACK | Freq: Every day | ORAL | Status: DC | PRN
  Filled 2014-06-06: qty 1

## 2014-06-06 MED ORDER — SODIUM CHLORIDE 0.9 % IJ SOLN
3.0000 mL | Freq: Two times a day (BID) | INTRAMUSCULAR | Status: DC
  Administered 2014-06-06 – 2014-06-07 (×3): 3 mL via INTRAVENOUS

## 2014-06-06 MED ORDER — PNEUMOCOCCAL VAC POLYVALENT 25 MCG/0.5ML IJ INJ
0.5000 mL | INJECTION | Freq: Once | INTRAMUSCULAR | Status: DC
  Filled 2014-06-06 (×2): qty 0.5

## 2014-06-06 MED ORDER — ONDANSETRON HCL 4 MG PO TABS: 4.0000 mg | ORAL_TABLET | Freq: Four times a day (QID) | ORAL | Status: DC | PRN

## 2014-06-06 MED ORDER — HYDROCODONE-ACETAMINOPHEN 5-325 MG PO TABS: 1.0000 | ORAL_TABLET | ORAL | Status: DC | PRN

## 2014-06-06 MED ORDER — DEXTROSE 50 % IV SOLN: 50.0000 mL | Freq: Once | INTRAVENOUS | Status: AC | PRN

## 2014-06-06 MED ORDER — ONDANSETRON HCL 4 MG/2ML IJ SOLN
4.0000 mg | Freq: Four times a day (QID) | INTRAMUSCULAR | Status: DC | PRN
  Administered 2014-06-07: 4 mg via INTRAVENOUS
  Filled 2014-06-06: qty 2

## 2014-06-06 NOTE — Progress Notes (Signed)
[name] arrived on 5E. Transferred to room bed. Assessment WNL, however, the swelling in both knees was documented. Family updated and visiting with patient.

## 2014-06-06 NOTE — ED Notes (Signed)
Bed: WA03 Expected date:  Expected time:  Means of arrival:  Comments: EMS- 68yo F, leg pain x 2 weeks

## 2014-06-06 NOTE — ED Notes (Signed)
Patient tried to urinate and was unable to go

## 2014-06-06 NOTE — ED Notes (Signed)
Pt's daughter, Jeiry Birnbaum [phone]; requests to be called with updates

## 2014-06-06 NOTE — H&P (Addendum)
[facility] History and Physical  [name] ACZ:[id number] DOB: Mar 21, 1946 DOA: 06/06/2014  Referring physician:  Davonna [name] PCP:  Janet Peers, MD   Chief Complaint:  Knee pain, fatigue  HPI:  The patient is a 68 y.o. year-old female with history of HTN, DM, previous DVT and vascular ulcer of the LLE that healed about a year ago, cronic knee pain for which she receives intermittent injections by [name] who presents with chronic knee pain.  About three weeks ago, she was seen in by her orthopedics physician for follow up at which time she had RLE pain and redness.  She was sent to the ER where duplex was negative.  She was given a prescription of levofloxacin for possible cellulitis and discharged home.  She followed up with her orthopedist a few weeks later (about one week ago) and was given injections in her right knee and right foot for arthritis.  The injections helped her foot, but her knee remained swollen and painful.  She was unable to bear weight on that leg despite ultram and has been essentially bedbound, using diapers and getting bedsores.  She also states she has had intermittent fevers, chills, fatigue, and no appetite for the last two weeks.  Tmax was a few days ago and was 102F.  She denies night sweats, cough, but she has had some weight loss, unknown amount because she is unable to stand on a scale.  She has had some increased urinary urgency and frequency for the last several weeks, but denies dysuria.  Today she called EMS because her family convinced her to come to the ER.  She has not eaten in the last two days.  When EMS arrived, her CBG was 49.    In the ER, VSS, labs notable for normal WBC, but glucose of 52 on BMP, and potassium 3.5.  XR knee:  Severe tricompartmental osteoarthritis.  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  She was  given juice and morphine in the ER and is being admitted for worsening knee pain, inability to walk, possible UTI, and hypoglycemia.  Incidentally, she was seen to have a paratracheal mass CXR which is concerning given her large right submandibular LN, particularly in the setting of loss of appetite, cachexia, and progressive functional decline.     Review of Systems:  General: per HPI HEENT:  Denies changes to hearing and vision, rhinorrhea, sinus congestion, sore throat CV:  Denies chest pain and palpitations, + lower extremity edema.  PULM:  Denies SOB, wheezing, cough.   GI:  Denies nausea, vomiting, constipation, diarrhea.   GU:  Per HPI ENDO:  Denies polyuria, polydipsia.   HEME:  Denies hematemesis, blood in stools, melena, abnormal bruising or bleeding.  LYMPH:  + lymphadenopathy right neck, stable for last few years MSK:  Chronic arthralgias, myalgias.   DERM:  Denies skin rash or ulcer.   NEURO:  Denies focal numbness, weakness, slurred speech, confusion, facial droop.  PSYCH:  Denies anxiety and depression.    Past Medical History  Diagnosis Date  . Hypertension   . Diabetes mellitus   . Chronic knee pain     right  . Polio    Past Surgical History  Procedure Laterality Date  . Abdominal hysterectomy     Social History:  reports that she quit smoking about 45 years ago. Her smoking use included Cigarettes. She  smoked 0.50 packs per day. She has never used smokeless tobacco. She reports that she does not drink alcohol or use illicit drugs. Currently mostly bedbound.  Has family who helps her get around.    Allergies  Allergen Reactions  . Penicillins Anaphylaxis and Hives    All 'cillins'  . Peach [Prunus Persica]   . Strawberry   . Pineapple Itching and Rash    Family History  Problem Relation Age of Onset  . Diabetes Mellitus II Neg Hx   . Hypertension    . High Cholesterol    . Ovarian cancer Mother   . Lupus Neg Hx   . Sarcoidosis Neg Hx      Prior to  Admission medications   Medication Sig Start Date End Date Taking? Authorizing Provider  irbesartan (AVAPRO) 300 MG tablet Take 300 mg by mouth daily.   Yes Historical Provider, MD  levofloxacin (LEVAQUIN) 750 MG tablet Take 750 mg by mouth daily.   Yes Historical Provider, MD  telmisartan-hydrochlorothiazide (MICARDIS HCT) 80-25 MG per tablet Take 1 tablet by mouth daily.   Yes Historical Provider, MD  terbinafine (LAMISIL) 250 MG tablet Take 250 mg by mouth daily.    Historical Provider, MD   Physical Exam: Filed Vitals:   06/06/14 1001 06/06/14 1138 06/06/14 1200 06/06/14 1406  BP: 124/72 132/87    Pulse: 106 106 102 98  Temp: 98.2 F (36.8 C)     TempSrc: Oral     Resp: 16 17 28 17   SpO2: 97% 97% 100% 99%     General:  Cachectic BF, NAD  Eyes:  PERRL, anicteric, non-injected.  ENT:  Nares clear.  MMM  Faint white streaks on tongue and possibly soft palate  Neck:  Supple without TM or JVD.    Lymph:  6cm soft mobile round mass right submandibular space, possibly some shotty LN on the left cervical chain and in right axilla.  No obvious left axillary LN or inguinal LN.  Cardiovascular:  RRR, normal S1, S2, 2/6 systolic murmur at the apex, LSB and heard faintly at RUSB.  2+ pulses, warm extremities  Respiratory:  CTA bilaterally without increased WOB.  Abdomen:  NABS.  Soft, ND/NT.    Skin:  No rashes or focal lesions.  Musculoskeletal:  Decreased bulk and tone.  Right lower extremity edema 1+ with bilateral warm knees with effusions.  Right foot with TTP across TMT joint line, toes nontender.  Psychiatric:  A & O x 4.  Appropriate affect.  Neurologic:  CN 3-12 intact.  4/5 strength upper extremities, 3/5 bilateral lower extremities.  Sensation intact.  Labs on Admission:  Basic Metabolic Panel:  Recent Labs Lab 06/06/14 1038  NA 139  K 3.5*  CL 97  CO2 25  GLUCOSE 52*  BUN 14  CREATININE 0.56  CALCIUM 9.4   Liver Function Tests:  Recent Labs Lab  06/06/14 1038  AST 12  ALT 6  ALKPHOS 70  BILITOT 0.9  PROT 7.7  ALBUMIN 3.1*   No results found for this basename: LIPASE, AMYLASE,  in the last 168 hours No results found for this basename: AMMONIA,  in the last 168 hours CBC:  Recent Labs Lab 06/06/14 1038  WBC 7.4  NEUTROABS 5.4  HGB 10.4*  HCT 33.8*  MCV 72.1*  PLT 259   Cardiac Enzymes: No results found for this basename: CKTOTAL, CKMB, CKMBINDEX, TROPONINI,  in the last 168 hours  BNP (last 3 results) No results found for this  basename: PROBNP,  in the last 8760 hours CBG:  Recent Labs Lab 06/06/14 1006 06/06/14 1135  GLUCAP 43* 80    Radiological Exams on Admission: Dg Chest 2 View  06/06/2014   CLINICAL DATA:  Weakness, hypertension, diabetes.  EXAM: CHEST  2 VIEW  COMPARISON:  02/17/2009  FINDINGS: Right peritracheal masslike density again noted, stable since 2010. Heart is normal size. Lungs are clear. No effusions. No acute bony abnormality.  IMPRESSION: Stable right paratracheal mass density. 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 No change since 2010. This could be further confirmed with chest CT.  No active disease.   Electronically Signed   By: Rolm Baptise M.D.   On: 06/06/2014 12:07   Dg Knee Complete 4 Views Right  06/06/2014   CLINICAL DATA:  Chronic right knee pain.  No known injury.  EXAM: RIGHT KNEE - COMPLETE 4+ VIEW  COMPARISON:  None.  FINDINGS: No acute bony or joint abnormality is identified. The patient has severe tricompartmental osteoarthritis appearing worst in the lateral and patellofemoral compartments. No joint effusion is identified.  IMPRESSION: No acute finding.  Severe tricompartmental osteoarthritis.   Electronically Signed   By: Inge Rise M.D.   On: 06/06/2014 12:07    EKG: Independently reviewed. Sinus tachycardia with diffuse ST segment elevations concerning for pericarditis  Assessment/Plan Active Problems:   Hypoglycemia   Fever   ECG abnormal   Knee pain,  chronic   Mass of right side of neck   Severe protein-calorie malnutrition   Microcytic anemia  ---  Chronic knee pain with inability to walk and failure to thrive for the last several weeks -  Will notify [place] for possible aspiration/injection  -  PT/OT   Hypoglycemia in setting of DM NOT on DM medications, likely due to not eating for last few days to weeks -  Monitor CBG -  TSH, cortisol -  Hemoglobin A1c -  Hypoglycemia protocol  Intermittent fevers at home, currently afebrile -  Check blood cultures -  Doubt UTI responsible because I imagine she would have become much more ill much sooner and had more obvious evidence of UTI on UA. -  No evidence of PNA  Heart murmur and ECG suggests pericarditis with diffuse ST segment elevations -  Telemetry -  Cycle troponins -  ECHO  HTN, BP wnl.   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 but never follow up and now clearly bigger, plus anterior neck mass which was never fully characterized  -  CT neck and chest for now -  Check thyroid studies and LDH -  Will need biopsy, possibly excisional biopsy -  Smear unremarkable  Severe protein calorie malnutrition -  Nutrition consult -  Regular diet -  Supplements -  Consider appetite stimulant but will hold off for now given work up above  Possible UTI -  Urine culture -  Hold on abx for now  DM, but not on any medications and currently malnourished appearing and hypoglycemic  -  A1c   Microcytic anemia, chronic, hemoglobin at baseline -  Iron studies, B12, folate, TSH  Globulin gap -  HIV, HCV, and SPEP/UPEP/IFE  Diet:  Regular  Access:  PIV IVF:  yes Proph:  Heparin   Code Status: full Family Communication: patient and her sister Disposition Plan: Admit to med-surg  Time spent: 60 min Janece Canterbury [facility] Pager [phone]  If 7PM-7AM, please contact  night-coverage [email or web address] Password [facility] 06/06/2014, 4:13 PM

## 2014-06-06 NOTE — Progress Notes (Signed)
Discussed admission status with Dr. Sheran Fava.

## 2014-06-06 NOTE — ED Provider Notes (Signed)
CSN: [id number]     Arrival date & time 06/06/14  1000 History   First MD Initiated Contact with Patient 06/06/14 1019     Chief Complaint  Patient presents with  . Knee Pain    right  . Hypoglycemia     (Consider location/radiation/quality/duration/timing/severity/associated sxs/prior Treatment) Patient is a 68 y.o. female presenting with knee pain and hypoglycemia. The history is provided by the patient.  Knee Pain Associated symptoms: fatigue   Associated symptoms: no back pain   Hypoglycemia  patient's had some generalized weakness for the last 2 weeks. She has fatigue. She states she also kind of hurts all over. She's also been having increased pain in her right knee. She's chronic knee pain and has had a recent injection for also. Injection was around 2 weeks ago. She states she's had some chills without clear fever. No nausea vomiting. No diarrhea. She's had a mild cough without production. She's had some urinary frequency. No trauma. She has a mass to her right upper neck towards the angle of the jaw. She states his been there for a couple years. States she was seen for it but never followed up.   Past Medical History  Diagnosis Date  . Hypertension   . Diabetes mellitus   . Chronic knee pain     right  . Polio    Past Surgical History  Procedure Laterality Date  . Abdominal hysterectomy     Family History  Problem Relation Age of Onset  . Diabetes Mellitus II Neg Hx   . Hypertension    . High Cholesterol    . Ovarian cancer Mother   . Lupus Neg Hx   . Sarcoidosis Neg Hx    History  Substance Use Topics  . Smoking status: Former Smoker -- 0.50 packs/day    Types: Cigarettes    Quit date: 09/09/1968  . Smokeless tobacco: Not on file  . Alcohol Use: No   OB History   Grav Para Term Preterm Abortions TAB SAB Ect Mult Living                 Review of Systems  Constitutional: Positive for appetite change and fatigue.  Respiratory: Positive for cough.    Cardiovascular: Negative for chest pain.  Genitourinary: Positive for frequency. Negative for flank pain.  Musculoskeletal: Positive for gait problem and joint swelling. Negative for back pain.       Left knee pain  Psychiatric/Behavioral: Negative for confusion.      Allergies  Penicillins; Peach; Strawberry; and Pineapple  Home Medications   Prior to Admission medications   Medication Sig Start Date End Date Taking? Authorizing Provider  hydrochlorothiazide (HYDRODIURIL) 25 MG tablet Take 25 mg by mouth daily.   Yes Historical Provider, MD  irbesartan (AVAPRO) 300 MG tablet Take 300 mg by mouth daily.   Yes Historical Provider, MD  traMADol (ULTRAM) 50 MG tablet Take 50 mg by mouth every 6 (six) hours as needed for moderate pain or severe pain.   Yes Historical Provider, MD   BP 132/87  Pulse 98  Temp(Src) 98.2 F (36.8 C) (Oral)  Resp 17  SpO2 99% Physical Exam  Constitutional: She is oriented to person, place, and time. She appears well-developed and well-nourished.  Neck: No tracheal deviation present. No thyromegaly present.  Firm mass to right neck near angle of jaw. Mild mobility. No erythema.  Cardiovascular: Normal rate and regular rhythm.   Pulmonary/Chest: Effort normal and breath sounds normal.  Abdominal:  Soft.  Musculoskeletal:  Effusion to left knee. Moderate. Moderate pain with movement of left knee. Decreased range of motion due to pain. Mild tenderness to right knee. Less effusion than contralateral. Decreased range of motion due to pain. Trace edema bilaterally below knees.  Lymphadenopathy:    She has no cervical adenopathy.  Neurological: She is alert and oriented to person, place, and time.  Skin: Skin is warm.    ED Course  Procedures (including critical care time) Labs Review Labs Reviewed  CBC WITH DIFFERENTIAL - Abnormal; Notable for the following:    Hemoglobin 10.4 (*)    HCT 33.8 (*)    MCV 72.1 (*)    MCH 22.2 (*)    All other  components within normal limits  COMPREHENSIVE METABOLIC PANEL - Abnormal; Notable for the following:    Potassium 3.5 (*)    Glucose, Bld 52 (*)    Albumin 3.1 (*)    Anion gap 17 (*)    All other components within normal limits  URINALYSIS, ROUTINE W REFLEX MICROSCOPIC - Abnormal; Notable for the following:    APPearance CLOUDY (*)    Hgb urine dipstick SMALL (*)    Bilirubin Urine SMALL (*)    Ketones, ur >80 (*)    Leukocytes, UA MODERATE (*)    All other components within normal limits  URINE MICROSCOPIC-ADD ON - Abnormal; Notable for the following:    Squamous Epithelial / LPF FEW (*)    Bacteria, UA FEW (*)    All other components within normal limits  CBG MONITORING, ED - Abnormal; Notable for the following:    Glucose-Capillary 43 (*)    All other components within normal limits  URINE CULTURE  CBG MONITORING, ED    Imaging Review Dg Chest 2 View  06/06/2014   CLINICAL DATA:  Weakness, hypertension, diabetes.  EXAM: CHEST  2 VIEW  COMPARISON:  02/17/2009  FINDINGS: Right peritracheal masslike density again noted, stable since 2010. Heart is normal size. Lungs are clear. No effusions. No acute bony abnormality.  IMPRESSION: Stable right paratracheal mass density. 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 No change since 2010. This could be further confirmed with chest CT.  No active disease.   Electronically Signed   By: Rolm Baptise M.D.   On: 06/06/2014 12:07   Dg Knee Complete 4 Views Right  06/06/2014   CLINICAL DATA:  Chronic right knee pain.  No known injury.  EXAM: RIGHT KNEE - COMPLETE 4+ VIEW  COMPARISON:  None.  FINDINGS: No acute bony or joint abnormality is identified. The patient has severe tricompartmental osteoarthritis appearing worst in the lateral and patellofemoral compartments. No joint effusion is identified.  IMPRESSION: No acute finding.  Severe tricompartmental osteoarthritis.   Electronically Signed   By: Inge Rise M.D.   On: 06/06/2014 12:07      EKG Interpretation None      MDM   Final diagnoses:  Hypoglycemia  UTI (lower urinary tract infection)    Patient with hypoglycemia. Also his bilateral knee pain and generalized weakness. Has apparent UTI which could be the cause for the weakness and the difficult to control blood sugar. Will admit to internal medicine for further monitoring. Has anaphylaxis to penicillin. Will give Bactrim. Has had some workup in the past of mass on face, however it is difficult to determine what that has been by history or by records available. May need to be followed.    Jasper Riling. Alvino Chapel, MD 06/06/14 [phone]

## 2014-06-06 NOTE — ED Notes (Signed)
Patient tried to urinate and was unsuccessful 

## 2014-06-06 NOTE — ED Notes (Signed)
Per EMS pt from home, having R knee pain x 2 weeks, states pain has gotten so bad she can't walk d/t pain, states has also developed a fever, saw PCP a week ago, received a cortisone shot then, CBG 49 for EMS.

## 2014-06-06 NOTE — ED Notes (Signed)
Pt states she has chronic R knee pain, states the past couple weeks the knee pain has gotten worse, bil knees are swollen, R knee is the only painful one, states got a cortisone shot 2 weeks ago, states it didn't help like usually does, pt states she also hasn't been checking blood sugar every day, states the other day her niece checked it and it was a little low but not this low, pt also states has had some urinary discomfort recently and has to wear depends also lately.

## 2014-06-06 NOTE — Progress Notes (Signed)
  CARE MANAGEMENT ED NOTE 06/06/2014  Patient:  [name], [name]   Account Number:  [id number]  Date Initiated:  06/06/2014  Documentation initiated by:  Livia Snellen  Subjective/Objective Assessment:   Patient presents to Ed with right knee pain and hypoglycemia     Subjective/Objective Assessment Detail:   Patient with pmhx of HTN, DM and chronic knee pain     Action/Plan:   Action/Plan Detail:   Anticipated DC Date:       Status Recommendation to Physician:   Result of Recommendation:    Other ED Services  Consult Working [place]  CM consult  Other    Choice offered to / List presented to:  C-1 Patient          Status of service:  Completed, signed off  ED Comments:   ED Comments Detail:  EDCM spoke to patient at bedside.  Patient getting ready to be transferred to unit at this time.  Patient lives at home.  EDCM provided patient with list of home health agencies in [facility]. EDCM explained to patient with home health, she may receive a visiting RN, PT, OT and social worker if needed.  EDCM also provided patient with list of private duty nurisng agencies and explained it would be an out of pocket expense for the patient.  Dr. Herma Carson bland listed as patient's pcp.  Patient reports, "I really don't have a pcp.  Dr.Bland just does my blood pressure checks and refills blood pressure medication.  I have been seeing Dr. Criss Rosales for about two years now." Patient and family thankful for resources.  No further EDCM needs at this time.

## 2014-06-07 ENCOUNTER — Encounter (HOSPITAL_COMMUNITY): Payer: Self-pay | Admitting: Radiology

## 2014-06-07 ENCOUNTER — Observation Stay (HOSPITAL_COMMUNITY): Payer: Medicare Other

## 2014-06-07 DIAGNOSIS — I309 Acute pericarditis, unspecified: Secondary | ICD-10-CM | POA: Diagnosis present

## 2014-06-07 DIAGNOSIS — M25569 Pain in unspecified knee: Secondary | ICD-10-CM

## 2014-06-07 DIAGNOSIS — E440 Moderate protein-calorie malnutrition: Secondary | ICD-10-CM | POA: Insufficient documentation

## 2014-06-07 DIAGNOSIS — E46 Unspecified protein-calorie malnutrition: Secondary | ICD-10-CM | POA: Diagnosis not present

## 2014-06-07 DIAGNOSIS — R220 Localized swelling, mass and lump, head: Secondary | ICD-10-CM | POA: Diagnosis not present

## 2014-06-07 DIAGNOSIS — R221 Localized swelling, mass and lump, neck: Secondary | ICD-10-CM | POA: Diagnosis not present

## 2014-06-07 DIAGNOSIS — N390 Urinary tract infection, site not specified: Secondary | ICD-10-CM | POA: Diagnosis not present

## 2014-06-07 DIAGNOSIS — M1710 Unilateral primary osteoarthritis, unspecified knee: Secondary | ICD-10-CM | POA: Diagnosis not present

## 2014-06-07 DIAGNOSIS — G8929 Other chronic pain: Secondary | ICD-10-CM

## 2014-06-07 DIAGNOSIS — R011 Cardiac murmur, unspecified: Secondary | ICD-10-CM

## 2014-06-07 LAB — T4, FREE: Free T4: 1.02 ng/dL (ref 0.80–1.80)

## 2014-06-07 LAB — URINE CULTURE
CULTURE: NO GROWTH
Colony Count: NO GROWTH

## 2014-06-07 LAB — GLUCOSE, CAPILLARY
Glucose-Capillary: 61 mg/dL — ABNORMAL LOW (ref 70–99)
Glucose-Capillary: 70 mg/dL (ref 70–99)
Glucose-Capillary: 84 mg/dL (ref 70–99)
Glucose-Capillary: 158 mg/dL — ABNORMAL HIGH (ref 70–99)

## 2014-06-07 LAB — HEMOGLOBIN A1C
HEMOGLOBIN A1C: 5.3 % (ref <5.7)
Mean Plasma Glucose: 105 mg/dL (ref <117)

## 2014-06-07 LAB — C-REACTIVE PROTEIN: CRP: 8.3 mg/dL — ABNORMAL HIGH (ref <0.60)

## 2014-06-07 LAB — HEPATITIS C ANTIBODY: HCV AB: NEGATIVE

## 2014-06-07 LAB — PROTIME-INR
INR: 1.21 (ref 0.00–1.49)
Prothrombin Time: 15.3 seconds — ABNORMAL HIGH (ref 11.6–15.2)

## 2014-06-07 LAB — APTT: aPTT: 38 seconds — ABNORMAL HIGH (ref 24–37)

## 2014-06-07 LAB — HIV ANTIBODY (ROUTINE TESTING W REFLEX): HIV 1&2 Ab, 4th Generation: NONREACTIVE

## 2014-06-07 LAB — TROPONIN I
Troponin I: <0.3 ng/mL (ref <0.30)
Troponin I: <0.3 ng/mL (ref <0.30)

## 2014-06-07 LAB — T3, FREE: T3 FREE: 2.2 pg/mL — AB (ref 2.3–4.2)

## 2014-06-07 LAB — FOLATE RBC: RBC Folate: 750 ng/mL — ABNORMAL HIGH (ref >280)

## 2014-06-07 LAB — TRANSFERRIN: TRANSFERRIN: 114 mg/dL — AB (ref 200–360)

## 2014-06-07 MED ORDER — MIDAZOLAM HCL 2 MG/2ML IJ SOLN
INTRAMUSCULAR | Status: AC
  Filled 2014-06-07: qty 6

## 2014-06-07 MED ORDER — FENTANYL CITRATE 0.05 MG/ML IJ SOLN
INTRAMUSCULAR | Status: AC
  Filled 2014-06-07: qty 6

## 2014-06-07 MED ORDER — FERROUS SULFATE 325 (65 FE) MG PO TABS
325.0000 mg | ORAL_TABLET | Freq: Two times a day (BID) | ORAL | Status: DC
  Administered 2014-06-07 – 2014-06-08 (×2): 325 mg via ORAL
  Filled 2014-06-07 (×4): qty 1

## 2014-06-07 MED ORDER — IBUPROFEN 800 MG PO TABS
800.0000 mg | ORAL_TABLET | Freq: Three times a day (TID) | ORAL | Status: DC
  Administered 2014-06-07 – 2014-06-08 (×3): 800 mg via ORAL
  Filled 2014-06-07 (×5): qty 1

## 2014-06-07 MED ORDER — MIDAZOLAM HCL 2 MG/2ML IJ SOLN
INTRAMUSCULAR | Status: AC | PRN
  Administered 2014-06-07: 1 mg via INTRAVENOUS
  Administered 2014-06-07: 0.5 mg via INTRAVENOUS

## 2014-06-07 MED ORDER — FENTANYL CITRATE 0.05 MG/ML IJ SOLN
INTRAMUSCULAR | Status: AC | PRN
  Administered 2014-06-07: 25 ug via INTRAVENOUS

## 2014-06-07 MED ORDER — IOHEXOL 300 MG/ML  SOLN
100.0000 mL | Freq: Once | INTRAMUSCULAR | Status: AC | PRN
  Administered 2014-06-07: 100 mL via INTRAVENOUS

## 2014-06-07 MED ORDER — FENTANYL CITRATE 0.05 MG/ML IJ SOLN
INTRAMUSCULAR | Status: DC | PRN
  Administered 2014-06-07: 25 ug via INTRAVENOUS

## 2014-06-07 NOTE — Procedures (Signed)
Interventional Radiology Procedure Note  Procedure: US guided right parotid/neck mass biospy.  Complications: No immediate Recommendations:  Follow up path Routine dressing care   Signed,  Dulcy Fanny. Earleen Newport, DO

## 2014-06-07 NOTE — Progress Notes (Signed)
PT Cancellation Note  Patient Details Name: ISIS COSTANZA MRN: [id number] DOB: 1946-03-02   Cancelled Treatment:    Reason Eval/Treat Not Completed: Patient at procedure or test/unavailable--attempted PT eval. Pt awaiting transport for procedure and requested for PT to check back another time. Will check back later today if schedule permits. Otherwise, will plan to evaluate pt on tomorrow. Thanks.    Weston Anna, MPT Pager: [phone]

## 2014-06-07 NOTE — Progress Notes (Signed)
  Echocardiogram 2D Echocardiogram has been performed.  Wilmary Levit 06/07/2014, 2:30 PM

## 2014-06-07 NOTE — Consult Note (Signed)
Referring Physician(s): Dr. Jearld Adjutant  Reason for consult: right neck mass biopsy History of Present Illness: Pt is a 68 yo BF with hx polio as a child, previous LLE DVT and chronic knee pain who presented to hospital with inability to bear weight and FTT. In addition pt noted to have right neck mass which, according to pt, has been present for several years and fluctuates in size. Over past 4-5 months she has noticed an increase in size of the mass. It is nontender and has never been biopsied. Mass was present on CT from 2007. 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 Also present is thyroid goiter and right peritracheal mass which may represent extension of goiter. No known history of cancer. Request is now received for US guided biopsy of the neck mass.   Allergies: Penicillins; Peach; Strawberry; and Pineapple  Medications: Prior to Admission medications   Medication Sig Start Date End Date Taking? Authorizing Provider  hydrochlorothiazide (HYDRODIURIL) 25 MG tablet Take 25 mg by mouth daily.   Yes Historical Provider, MD  irbesartan (AVAPRO) 300 MG tablet Take 300 mg by mouth daily.   Yes Historical Provider, MD  traMADol (ULTRAM) 50 MG tablet Take 50 mg by mouth every 6 (six) hours as needed for moderate pain or severe pain.   Yes Historical Provider, MD    Review of Systems see above  Vital Signs: BP 127/70  Pulse 103  Temp(Src) 99 F (37.2 C) (Oral)  Resp 18  Ht 5\' 7"  (1.702 m)  Wt 210 lb (95.255 kg)  BMI 32.88 kg/m2  SpO2 99%  Physical Exam pt awake/alert; chest- CTA bilat; heart- tachy but regular, 2/6 SEM; abd- soft,+BS,NT; rt parotid region mass, sl mobile, NT; fullness of thyroid gland; ext- 1+ edema on right  Imaging: Dg Chest 2 View  06/06/2014   CLINICAL DATA:  Weakness, hypertension, diabetes.  EXAM: CHEST  2 VIEW  COMPARISON:  02/17/2009  FINDINGS: Right  peritracheal masslike density again noted, stable since 2010. Heart is normal size. Lungs are clear. No effusions. No acute bony abnormality.  IMPRESSION: Stable right paratracheal mass density. 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 No change since 2010. This could be further confirmed with chest CT.  No active disease.   Electronically Signed   By: Rolm Baptise M.D.   On: 06/06/2014 12:07   Ct Soft Tissue Neck W Contrast  06/07/2014   CLINICAL DATA:  Right lateral neck mass located below the right ear. Paratracheal mass.  EXAM: CT NECK WITH CONTRAST  TECHNIQUE: Multidetector CT imaging of the neck was performed using the standard protocol following the bolus administration of intravenous contrast.  CONTRAST:  123mL OMNIPAQUE IOHEXOL 300 MG/ML  SOLN  COMPARISON:  CT neck with contrast 01/10/2006  FINDINGS: A peripherally enhancing lesion within the inferior aspect of the right parotid gland demonstrates interval growth, now measuring 2.8 x 3.7 x 4.2 cm. The lesion appears to be contained within the parotid gland without extracapsular extension. No significant associated adenopathy is evident.  The left parotid gland is within normal limits. The submandibular glands are normal bilaterally. There is some heterogeneity of the palatine tonsils with calcifications compatible with remote infection.  No focal mucosal or submucosal lesions are evident.  The vocal cords are midline and symmetric. A thyroid goiter is again seen with substernal descent on the left. Similar density soft tissue is noted along  the right peritracheal station deep into the mediastinum. Both the left lobe of thyroid and right peritracheal lesion demonstrate only slight interval growth over the last 8 years. Degenerative changes of the cervical spine are stable. The lung apices are clear.  IMPRESSION: 1. 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 There are no aggressive features. 2. No significant adenopathy. 3. Minimal interval growth of a thyroid goiter. 4. Minimal interval growth of a right peritracheal mass. This is most likely related to the thyroid goiter although a direct connection is difficult to identify.   Electronically Signed   By: Lawrence Santiago M.D.   On: 06/07/2014 11:45   Ct Chest W Contrast  06/07/2014   ADDENDUM REPORT: 06/07/2014 11:15  ADDENDUM: Unknown at the time of the original Chest CT interpretation is that there is an old neck CT from 01/10/2006. Comparison is made to that study. At that time, there was a large right paratracheal mass which is likely not significantly changed. Given that this is unchanged since 2007, I suspect this represents an exophytic thyroid mass and substernal goiter despite the fact that direct communication cannot be visualized by CT. This does not explain the precarinal adenopathy. May consider PET CT or transbronchial biopsy for tissue diagnosis.   Electronically Signed   By: Rolm Baptise M.D.   On: 06/07/2014 11:15   06/07/2014   CLINICAL DATA:  Possible paratracheal mass.  EXAM: CT CHEST WITH CONTRAST  TECHNIQUE: Multidetector CT imaging of the chest was performed during intravenous contrast administration.  CONTRAST:  126mL OMNIPAQUE IOHEXOL 300 MG/ML  SOLN  COMPARISON:  Chest x-ray performed 06/06/2014.  FINDINGS: There is a large right paratracheal mass measuring 5.8 x 5.8 cm on image 19. This extends to near the right lobe with the thyroid but appears to be separate from the thyroid on reconstructed images in addition, there is a large left thyroid lobe mass which measures 4.5 x 4.1 cm.  No hilar or axillary adenopathy. No visible supraclavicular adenopathy.  Heart is normal size. Aorta is normal caliber. Chest wall soft tissues are unremarkable. Imaging into the upper abdomen shows no acute findings.  Linear densities in the lung bases  compatible with atelectasis or scarring. Lungs are otherwise clear. No pleural effusions or pericardial effusion.  Degenerative changes in the thoracic spine. Adjacent precarinal/ AP window nodal mass measures 3.3 x 3.2 cm on image 25.  IMPRESSION: Enlargement of the left thyroid lobe with 4.5 cm left thyroid mass, nonspecific. This could be further evaluated with thyroid ultrasound.  Right paratracheal mass appears separate from the thyroid and likely represents adenopathy. Adenopathy also noted in the region of the AP window and precarinal region. Cannot exclude metastatic disease or a lymphoproliferative disorder.  Electronically Signed: By: Rolm Baptise M.D. On: 06/07/2014 10:31   Dg Knee Complete 4 Views Right  06/06/2014   CLINICAL DATA:  Chronic right knee pain.  No known injury.  EXAM: RIGHT KNEE - COMPLETE 4+ VIEW  COMPARISON:  None.  FINDINGS: No acute bony or joint abnormality is identified. The patient has severe tricompartmental osteoarthritis appearing worst in the lateral and patellofemoral compartments. No joint effusion is identified.  IMPRESSION: No acute finding.  Severe tricompartmental osteoarthritis.   Electronically Signed   By: Inge Rise M.D.   On: 06/06/2014 12:07    Labs: 06/06/14   WBC  7.4  HGB 10.4  PLTS 259K  06/07/14  PT 15.3  INR 1.21   Assessment and Plan:  Pt is a 68 yo BF with hx polio as a child, previous LLE DVT and chronic knee pain who presented to hospital with inability to bear weight and FTT. In addition pt noted to have right neck mass which, according to pt, has been present for several years and fluctuates in size. Over past 4-5 months she has noticed an increase in size of the mass. It is nontender and has never been biopsied. Mass was present on CT from 2007. 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 Also present is thyroid goiter and right peritracheal mass  which may represent extension of goiter. No known history of cancer. Request is now received for US guided biopsy of the neck mass.Imaging studies have been reviewed by Dr. Earleen Newport. Plan is for US guided biopsy of the rt parotid mass today . Details/risks of procedure d/w pt with her understanding and consent.         I spent a total of 20 minutes face to face in clinical consultation/evaluation, greater than 50% of which was counseling/coordinating care for RIGHT PAROTID MASS BIOPSY.  Signed: Autumn Messing 06/07/2014, 12:10 PM

## 2014-06-07 NOTE — Consult Note (Signed)
PATIENT ID: [name]  MRN: [id number]  DOB/AGE:  1946/01/09 / 68 y.o.     Consult Note  Chief Complaint:  Knee Pain  HPI:   Patient resented to the ER yesterday after she contacted EMS due to right knee pain, intermittent fevers, chills, fatigue and concerns with a urinary tract infection.  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  She also has a past history of chronic knee pain and has received intermittent injections at [facility].  Patient states that due to her knee pain she has been unable to bear weight and has essentially made her bed bound.  She has been using diapers and has gotten bedsores due to this.  Patient has been followed by Dr. Janece Canterbury with internal medicine for her other medical issues.    Objective: Vital signs in last 24 hours: Temp:  [97.9 F (36.6 C)-99.3 F (37.4 C)] 99 F (37.2 C) (09/29 0530) Pulse Rate:  [96-112] 103 (09/29 0530) Resp:  [17-20] 18 (09/29 0530) BP: (116-138)/(64-82) 127/70 mmHg (09/29 0530) SpO2:  [95 %-100 %] 99 % (09/29 0530) Weight:  [95.255 kg (210 lb)] 95.255 kg (210 lb) (09/28 1710)      Intake/Output from previous day:     Intake/Output this shift:     LABORATORY DATA:  Recent Labs  06/06/14 1038 06/06/14 1135 06/06/14 2143 06/07/14 0753 06/07/14 1000  WBC 7.4  --   --   --   --   HGB 10.4*  --   --   --   --   HCT 33.8*  --   --   --   --   PLT 259  --   --   --   --   NA 139  --   --   --   --   K 3.5*  --   --   --   --   CL 97  --   --   --   --   CO2 25  --   --   --   --   BUN 14  --   --   --   --   CREATININE 0.56  --   --   --   --   GLUCOSE 52*  --   --   --   --   GLUCAP  --  80 101* 61*  --   INR  --   --   --   --  1.21  CALCIUM 9.4  --   --   --   --     Examination: Neurologically intact Neurovascular intact Sensation intact  distally Intact pulses distally Dorsiflexion/Plantar flexion intact Compartment soft Pt has diffuse tenderness over the right knee.  No signs of effusion.}  Assessment:    End-stage arthritis of right knee  ADDITIONAL DIAGNOSIS:  Diabetes, Hypertension and dvt, vascular ulcer, right neck mass, heart murmur  Plan:  Weight Bearing as Tolerated (WBAT)  Treatment options are discussed with the patient.  At this time she is not a surgical candidate.  The definitive treatment for her knee pain would be joint replacement.  She is told that she will need clearance from her primary care provider and a specialist that she sees prior to scheduling.  In the interim, the patient has agreed and consented to a right knee cortisone  injection.  She is made aware the benefits risks and potential adverse effects of injections.  She wishes to proceed with the injection.  Procedure:  Risks, benefits and complications were reviewed with the patient. After obtaining informed verbal consent the right knee was sterilely prepped with alcohol.  Ethyl chloride was used to anesthetize the skin and intra-articular injection using a 22-gauge by 1-1/2 inch needle was accomplished without difficulty.  We injected 3 cc of 1% Xylocaine, 3 cc of half percent Marcaine, and 3 cc of Celestone.  Patient tolerated the procedure well and was observed for 10 min. Post injection instructions, including signs and symptoms of complications were discussed.     Jeff Frieden R 06/07/2014, 12:35 PM

## 2014-06-07 NOTE — Progress Notes (Signed)
OT Cancellation Note  Patient Details Name: [name] MRN: [id number] DOB: 01-04-1946   Cancelled Treatment:    Reason Eval/Treat Not Completed: Other (comment).  Pt is getting ready for a test.  Will check back later today or tomorrow.  [name] 06/07/2014, 2:54 PM [name], OTR/L [phone] 06/07/2014

## 2014-06-07 NOTE — Progress Notes (Signed)
INITIAL NUTRITION ASSESSMENT  Pt meets criteria for moderate MALNUTRITION in the context of chronic illness as evidenced by <50% estimated energy intake in the past month with moderate fat loss in upper arms.  DOCUMENTATION CODES Per approved criteria  -Non-severe (moderate) malnutrition in the context of chronic illness -Obesity unspecified   INTERVENTION: - Diet advancement per MD - Recommend Ensure Complete HS once diet advanced - Recommend, as diet advanced and pt eating >25% of meals, MD order daily potassium, magnesium, and phosphorus x 3 days to monitor for refeeding syndrome r/t pt report of eating 0 meals x 3 weeks - RD to continue to monitor   NUTRITION DIAGNOSIS: Inadequate oral intake related to inability to eat as evidenced by NPO.   Goal: Advance diet as tolerated to diabetic diet  Monitor:  Weights, labs, diet advancement  Reason for Assessment: Malnutrition screening tool, consult for assessment  68 y.o. female  Admitting Dx: Knee pain, fatigue   ASSESSMENT: Pt with history of HTN, DM, previous DVT and vascular ulcer of the LLE that healed about a year ago, chronic knee pain for which she receives intermittent injections by [facility] [employer] who presents with chronic knee pain. Pt with large right submandibular mass.   - Met with pt who reports minimal PO intake x 3 weeks due to losing her appetite and c/o nothing has any taste - Pt is sure she's lost weight but doesn't know how much - Before then she would eat 3 meals/day - a breakfast bar in the morning, sandwich for lunch, and a good dinner - Pt denies any difficulty chewing or pain with swallowing/eating  Nutrition Focused Physical Exam:  Subcutaneous Fat:  Orbital Region: wnl Upper Arm Region: moderate fat loss Thoracic and Lumbar Region: wnl  Muscle:  Temple Region: wnl Clavicle Bone Region: wnl Clavicle and Acromion Bone Region: wnl Scapular Bone Region: NA Dorsal Hand: wnl Patellar Region:  wnl Anterior Thigh Region: wnl Posterior Calf Region: wnl  Edema: right knee     Height: Ht Readings from Last 1 Encounters:  06/06/14 $RemoveB'5\' 7"'JghesMNr$  (1.702 m)    Weight: Wt Readings from Last 1 Encounters:  06/06/14 210 lb (95.255 kg)    Ideal Body Weight: 135 lbs   % Ideal Body Weight: 155%  Wt Readings from Last 10 Encounters:  06/06/14 210 lb (95.255 kg)  04/08/12 180 lb (81.647 kg)    Usual Body Weight: Pt unsure     BMI:  Body mass index is 32.88 kg/(m^2). Class I obesity   Estimated Nutritional Needs: Kcal: 1500-1700 Protein: 75-90g Fluid: 1.5-1.7L/day  Skin: Skin tear on right buttocks  Diet Order: NPO  EDUCATION NEEDS: -No education needs identified at this time  No intake or output data in the 24 hours ending 06/07/14 1352  Last BM: 9/25  Labs:   Recent Labs Lab 06/06/14 1038  NA 139  K 3.5*  CL 97  CO2 25  BUN 14  CREATININE 0.56  CALCIUM 9.4  GLUCOSE 52*    CBG (last 3)   Recent Labs  06/06/14 2143 06/07/14 0753 06/07/14 1239  GLUCAP 101* 61* 70    Scheduled Meds: . antiseptic oral rinse  7 mL Mouth Rinse q12n4p  . chlorhexidine  15 mL Mouth Rinse BID  . docusate sodium  100 mg Oral BID  . feeding supplement (ENSURE COMPLETE)  237 mL Oral BID BM  . heparin  5,000 Units Subcutaneous 3 times per day  . hydrochlorothiazide  25 mg Oral Daily  .  Influenza vac split quadrivalent PF  0.5 mL Intramuscular Once  . irbesartan  300 mg Oral Daily  . pneumococcal 23 valent vaccine  0.5 mL Intramuscular Once  . senna  1 tablet Oral BID  . sodium chloride  3 mL Intravenous Q12H    Continuous Infusions:   Past Medical History  Diagnosis Date  . Hypertension   . Diabetes mellitus   . Chronic knee pain     right  . Polio     Past Surgical History  Procedure Laterality Date  . Abdominal hysterectomy      Carlis Stable MS, RD, LDN [phone] Pager [phone] Weekend/After Hours Pager

## 2014-06-07 NOTE — Progress Notes (Addendum)
[facility] PROGRESS NOTE  [name] YIR:[id number] DOB: 27-Apr-1946 DOA: 06/06/2014 PCP: Elyn Peers, MD   Brief narrative 68 year old female with history of hypertension, diabetes mellitus, chronic right knee pain for which she receives intermittent injections from Dr. Paulo Fruit in [facility] presented to the ED with growing right knee pain. She was seen by her orthopedist in about 3 weeks back for right leg pain and redness and had a negative Doppler of her lower extremity. She was given a prescription for Levaquin for possible cellulitis. She was given an intra-articular injection in her right knee and right foot for pain and arthritis symptoms. Although the right foot pain improved a right knee pain symptoms worsened and was swollen. She was unable to bear weight due to pain and has been restricted to bed. She reports intermittent fevers with chills and generalized weakness. He reports having a fever 102F 2 days ago. She called EMS to come to the hospital and was found to have CBG of 49 when checked by EMS. Patient admitted for right knee pain, hypoglycemia and UTI.  Assessment/Plan: Acute on chronic right knee pain Patient has difficulty walking with pain and swelling with failure to thrive for past several weeks. Orthopedics consulted and an intra-articular injection of Xylocaine,, Marcaine and Celestone was given on 9/29. PT eval. Pain control with when necessary Vicodin.  Hypoglycemia Secondary to poor by mouth intake. No A1c of 5.3 and is not on any medications. Monitor blood glucose closely. Appreciate nutrition consult and added supplements. Thyroid function. A.m. Cortisol level pending.  Failure to thrive and intermittent fevers/ acute pericarditis Patient has been stable since admission. No clinical signs of infection so far. Follow blood culture. patient has findings of diffuse ST elevation on EKG. Denies chest pain symptoms. follow 2D echo.monitor on tele.  repeat EKG in am. Serial troponin have been negative. -Check CRP -Will place on scheduled ibuprofen 800 mg every 8 hours and monitor.  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 -IR guided biopsy today.  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 Thyroid function normal.  Severe protein calorie malnutrition Appreciate nutrition consult and added supplements.  Hypertension  Continue home medications  Type 2 diabetes mellitus A1c of 5.3. Not in any medications and hypoglycemic due to poor by mouth intake  Microcytic anemia In panel suggest iron deficiency. We'll add supplement  ? UTI Not started on abx. Follow cx   DVT prophylaxis  Diet: Regular  Code Status: Full code Family Communication: Daughter at bedside Disposition Plan: Pending PT eval and clinical improvement. Patient will need new PCP in the community. May refer to community wellness center or post discharge condition clinic in the interim   Consultants:  Orthopedics  IR  Procedures:  intra articular injection of steroid and local anesthetic  Right parotid biopsy  Antibiotics:  None  HPI/Subjective: Pt  reports pain in her rt knee  Objective: Filed Vitals:   06/07/14 1355  BP: 122/76  Pulse: 103  Temp: 98.7 F (37.1 C)  Resp: 18   No intake or output data in the 24 hours ending 06/07/14 1527 Filed Weights   06/06/14 1710  Weight: 95.255 kg (210 lb)    Exam:  General:  Elderly female lying in bed in no acute distress  HEENT: No pallor, moist oral mucosa, mass over right anterior neck extending from right parotid, nontender  Cardiovascular: Normal X5-Q0, 2/6 systolic murmur  Respiratory:  Clear to auscultation bilaterally, no added sounds  Abdomen: Soft, nondistended, nontender, bowel sounds present  Extremities: Warm, swelling and tenderness over right knee with limited range of motion  CNS: Alert and oriented  Data Reviewed: Basic Metabolic Panel:  Recent Labs Lab 06/06/14 1038  NA 139  K 3.5*  CL 97  CO2 25  GLUCOSE 52*  BUN 14  CREATININE 0.56  CALCIUM 9.4   Liver Function Tests:  Recent Labs Lab 06/06/14 1038  AST 12  ALT 6  ALKPHOS 70  BILITOT 0.9  PROT 7.7  ALBUMIN 3.1*   No results found for this basename: LIPASE, AMYLASE,  in the last 168 hours No results found for this basename: AMMONIA,  in the last 168 hours CBC:  Recent Labs Lab 06/06/14 1038  WBC 7.4  NEUTROABS 5.4  HGB 10.4*  HCT 33.8*  MCV 72.1*  PLT 259   Cardiac Enzymes:  Recent Labs Lab 06/06/14 1812 06/06/14 2356 06/07/14 0455  TROPONINI <0.30 <0.30 <0.30   BNP (last 3 results) No results found for this basename: PROBNP,  in the last 8760 hours CBG:  Recent Labs Lab 06/06/14 1006 06/06/14 1135 06/06/14 2143 06/07/14 0753 06/07/14 1239  GLUCAP 43* 80 101* 61* 70    Recent Results (from the past 240 hour(s))  CULTURE, BLOOD (ROUTINE X 2)     Status: None   Collection Time    06/06/14  4:55 PM      Result Value Ref Range Status   Specimen Description BLOOD RIGHT ARM   Final   Special Requests BOTTLES DRAWN AEROBIC AND ANAEROBIC 5CC   Final   Culture  Setup Time     Final   Value: 06/06/2014 22:09     Performed at [facility]   Culture     Final   Value:        BLOOD CULTURE RECEIVED NO GROWTH TO DATE CULTURE WILL BE HELD FOR 5 DAYS BEFORE ISSUING A FINAL NEGATIVE REPORT     Performed at [facility]   Report Status PENDING   Incomplete  CULTURE, BLOOD (ROUTINE X 2)     Status: None   Collection Time    06/06/14  5:10 PM      Result Value Ref Range Status   Specimen Description BLOOD RIGHT ARM   Final   Special Requests BOTTLES  DRAWN AEROBIC ONLY 5CC   Final   Culture  Setup Time     Final   Value: 06/06/2014 22:10     Performed at [facility]   Culture     Final   Value:        BLOOD CULTURE RECEIVED NO GROWTH TO DATE CULTURE WILL BE HELD FOR 5 DAYS BEFORE ISSUING A FINAL NEGATIVE REPORT     Performed at [facility]   Report Status PENDING   Incomplete     Studies: Dg Chest 2 View  06/06/2014   CLINICAL DATA:  Weakness, hypertension, diabetes.  EXAM: CHEST  2 VIEW  COMPARISON:  02/17/2009  FINDINGS: Right peritracheal masslike density again noted, stable since 2010. Heart is normal size. Lungs are clear. No effusions. No acute bony abnormality.  IMPRESSION: Stable right paratracheal mass density. 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 No  change since 2010. This could be further confirmed with chest CT.  No active disease.   Electronically Signed   By: Rolm Baptise M.D.   On: 06/06/2014 12:07   Ct Soft Tissue Neck W Contrast  06/07/2014   CLINICAL DATA:  Right lateral neck mass located below the right ear. Paratracheal mass.  EXAM: CT NECK WITH CONTRAST  TECHNIQUE: Multidetector CT imaging of the neck was performed using the standard protocol following the bolus administration of intravenous contrast.  CONTRAST:  112mL OMNIPAQUE IOHEXOL 300 MG/ML  SOLN  COMPARISON:  CT neck with contrast 01/10/2006  FINDINGS: A peripherally enhancing lesion within the inferior aspect of the right parotid gland demonstrates interval growth, now measuring 2.8 x 3.7 x 4.2 cm. The lesion appears to be contained within the parotid gland without extracapsular extension. No significant associated adenopathy is evident.  The left parotid gland is within normal limits. The submandibular glands are normal bilaterally. There is some heterogeneity of the palatine tonsils with calcifications compatible with remote infection.  No focal mucosal or submucosal lesions are evident.  The vocal cords are midline and symmetric. A thyroid  goiter is again seen with substernal descent on the left. Similar density soft tissue is noted along the right peritracheal station deep into the mediastinum. Both the left lobe of thyroid and right peritracheal lesion demonstrate only slight interval growth over the last 8 years. Degenerative changes of the cervical spine are stable. The lung apices are clear.  IMPRESSION: 1. 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 There are no aggressive features. 2. No significant adenopathy. 3. Minimal interval growth of a thyroid goiter. 4. Minimal interval growth of a right peritracheal mass. This is most likely related to the thyroid goiter although a direct connection is difficult to identify.   Electronically Signed   By: Lawrence Santiago M.D.   On: 06/07/2014 11:45   Ct Chest W Contrast  06/07/2014   ADDENDUM REPORT: 06/07/2014 11:15  ADDENDUM: Unknown at the time of the original Chest CT interpretation is that there is an old neck CT from 01/10/2006. Comparison is made to that study. At that time, there was a large right paratracheal mass which is likely not significantly changed. Given that this is unchanged since 2007, I suspect this represents an exophytic thyroid mass and substernal goiter despite the fact that direct communication cannot be visualized by CT. This does not explain the precarinal adenopathy. May consider PET CT or transbronchial biopsy for tissue diagnosis.   Electronically Signed   By: Rolm Baptise M.D.   On: 06/07/2014 11:15   06/07/2014   CLINICAL DATA:  Possible paratracheal mass.  EXAM: CT CHEST WITH CONTRAST  TECHNIQUE: Multidetector CT imaging of the chest was performed during intravenous contrast administration.  CONTRAST:  175mL OMNIPAQUE IOHEXOL 300 MG/ML  SOLN  COMPARISON:  Chest x-ray performed 06/06/2014.  FINDINGS: There is a large right  paratracheal mass measuring 5.8 x 5.8 cm on image 19. This extends to near the right lobe with the thyroid but appears to be separate from the thyroid on reconstructed images in addition, there is a large left thyroid lobe mass which measures 4.5 x 4.1 cm.  No hilar or axillary adenopathy. No visible supraclavicular adenopathy.  Heart is normal size. Aorta is normal caliber. Chest wall soft tissues are unremarkable. Imaging into the upper abdomen  shows no acute findings.  Linear densities in the lung bases compatible with atelectasis or scarring. Lungs are otherwise clear. No pleural effusions or pericardial effusion.  Degenerative changes in the thoracic spine. Adjacent precarinal/ AP window nodal mass measures 3.3 x 3.2 cm on image 25.  IMPRESSION: Enlargement of the left thyroid lobe with 4.5 cm left thyroid mass, nonspecific. This could be further evaluated with thyroid ultrasound.  Right paratracheal mass appears separate from the thyroid and likely represents adenopathy. Adenopathy also noted in the region of the AP window and precarinal region. Cannot exclude metastatic disease or a lymphoproliferative disorder.  Electronically Signed: By: Rolm Baptise M.D. On: 06/07/2014 10:31   Dg Knee Complete 4 Views Right  06/06/2014   CLINICAL DATA:  Chronic right knee pain.  No known injury.  EXAM: RIGHT KNEE - COMPLETE 4+ VIEW  COMPARISON:  None.  FINDINGS: No acute bony or joint abnormality is identified. The patient has severe tricompartmental osteoarthritis appearing worst in the lateral and patellofemoral compartments. No joint effusion is identified.  IMPRESSION: No acute finding.  Severe tricompartmental osteoarthritis.   Electronically Signed   By: Inge Rise M.D.   On: 06/06/2014 12:07    Scheduled Meds: . antiseptic oral rinse  7 mL Mouth Rinse q12n4p  . chlorhexidine  15 mL Mouth Rinse BID  . docusate sodium  100 mg Oral BID  . feeding supplement (ENSURE COMPLETE)  237 mL Oral BID BM  .  heparin  5,000 Units Subcutaneous 3 times per day  . hydrochlorothiazide  25 mg Oral Daily  . Influenza vac split quadrivalent PF  0.5 mL Intramuscular Once  . irbesartan  300 mg Oral Daily  . pneumococcal 23 valent vaccine  0.5 mL Intramuscular Once  . senna  1 tablet Oral BID  . sodium chloride  3 mL Intravenous Q12H   Continuous Infusions:     Time spent:25 MINUTES    Louellen Molder  [facility] Pager [phone]. If 7PM-7AM, please contact night-coverage at [email or web address], password [facility] 06/07/2014, 3:27 PM  LOS: 1 day

## 2014-06-07 NOTE — Progress Notes (Signed)
UR completed 

## 2014-06-08 LAB — PROTEIN ELECTROPHORESIS, SERUM
ALBUMIN ELP: 39.9 % — AB (ref 55.8–66.1)
ALPHA-1-GLOBULIN: 14.6 % — AB (ref 2.9–4.9)
Alpha-2-Globulin: 12.2 % — ABNORMAL HIGH (ref 7.1–11.8)
Beta 2: 5.9 % (ref 3.2–6.5)
Beta Globulin: 5.9 % (ref 4.7–7.2)
Gamma Globulin: 21.5 % — ABNORMAL HIGH (ref 11.1–18.8)
M-Spike, %: NOT DETECTED g/dL
TOTAL PROTEIN ELP: 6.4 g/dL (ref 6.0–8.3)

## 2014-06-08 LAB — BASIC METABOLIC PANEL
ANION GAP: 13 (ref 5–15)
BUN: 18 mg/dL (ref 6–23)
CALCIUM: 9.4 mg/dL (ref 8.4–10.5)
CO2: 26 mEq/L (ref 19–32)
Chloride: 100 mEq/L (ref 96–112)
Creatinine, Ser: 0.75 mg/dL (ref 0.50–1.10)
GFR calc Af Amer: >90 mL/min (ref >90)
GFR, EST NON AFRICAN AMERICAN: 84 mL/min — AB (ref >90)
Glucose, Bld: 137 mg/dL — ABNORMAL HIGH (ref 70–99)
POTASSIUM: 4.1 meq/L (ref 3.7–5.3)
SODIUM: 139 meq/L (ref 137–147)

## 2014-06-08 LAB — GLUCOSE, CAPILLARY
Glucose-Capillary: 118 mg/dL — ABNORMAL HIGH (ref 70–99)
Glucose-Capillary: 144 mg/dL — ABNORMAL HIGH (ref 70–99)
Glucose-Capillary: 245 mg/dL — ABNORMAL HIGH (ref 70–99)

## 2014-06-08 LAB — UIFE/LIGHT CHAINS/TP QN, 24-HR UR
ALPHA 2 UR: DETECTED — AB
Albumin, U: DETECTED
Alpha 1, Urine: DETECTED — AB
BETA UR: DETECTED — AB
Gamma Globulin, Urine: DETECTED — AB
TOTAL PROTEIN, URINE-UPE24: 36 mg/dL — AB (ref 5–24)

## 2014-06-08 LAB — CBC
HCT: 31.9 % — ABNORMAL LOW (ref 36.0–46.0)
Hemoglobin: 10 g/dL — ABNORMAL LOW (ref 12.0–15.0)
MCH: 22 pg — ABNORMAL LOW (ref 26.0–34.0)
MCHC: 31.3 g/dL (ref 30.0–36.0)
MCV: 70.3 fL — ABNORMAL LOW (ref 78.0–100.0)
PLATELETS: 263 10*3/uL (ref 150–400)
RBC: 4.54 MIL/uL (ref 3.87–5.11)
RDW: 14.4 % (ref 11.5–15.5)
WBC: 5.4 10*3/uL (ref 4.0–10.5)

## 2014-06-08 LAB — CORTISOL-AM, BLOOD: CORTISOL - AM: 10.4 ug/dL (ref 4.3–22.4)

## 2014-06-08 MED ORDER — IBUPROFEN 200 MG PO TABS: 200.0000 mg | ORAL_TABLET | Freq: Four times a day (QID) | ORAL | Status: DC | PRN

## 2014-06-08 MED ORDER — HYDROCODONE-ACETAMINOPHEN 5-325 MG PO TABS: 1.0000 | ORAL_TABLET | Freq: Four times a day (QID) | ORAL | Status: DC | PRN

## 2014-06-08 NOTE — Discharge Instructions (Signed)
Follow with Primary MD Elyn Peers, MD in 7 days   Get CBC, CMP, 2 view Chest X ray checked  by Primary MD next visit.    Activity: As tolerated with Full fall precautions use walker/cane & assistance as needed   Disposition Home    Diet: Heart Healthy  , with feeding assistance and aspiration precautions as needed.  For Heart failure patients - Check your Weight same time everyday, if you gain over 2 pounds, or you develop in leg swelling, experience more shortness of breath or chest pain, call your Primary MD immediately. Follow Cardiac Low Salt Diet and 1.8 lit/day fluid restriction.   On your next visit with her primary care physician please Get Medicines reviewed and adjusted.  Please request your Prim.MD to go over all Hospital Tests and Procedure/Radiological results at the follow up, please get all Hospital records sent to your Prim MD by signing hospital release before you go home.   If you experience worsening of your admission symptoms, develop shortness of breath, life threatening emergency, suicidal or homicidal thoughts you must seek medical attention immediately by calling 911 or calling your MD immediately  if symptoms less severe.  You Must read complete instructions/literature along with all the possible adverse reactions/side effects for all the Medicines you take and that have been prescribed to you. Take any new Medicines after you have completely understood and accpet all the possible adverse reactions/side effects.   Do not drive, operating heavy machinery, perform activities at heights, swimming or participation in water activities or provide baby sitting services if your were admitted for syncope or siezures until you have seen by Primary MD or a Neurologist and advised to do so again.  Do not drive when taking Pain medications.    Do not take more than prescribed Pain, Sleep and Anxiety Medications  Special Instructions: If you have smoked or chewed Tobacco   in the last 2 yrs please stop smoking, stop any regular Alcohol  and or any Recreational drug use.  Wear Seat belts while driving.   Please note  You were cared for by a hospitalist during your hospital stay. If you have any questions about your discharge medications or the care you received while you were in the hospital after you are discharged, you can call the unit and asked to speak with the hospitalist on call if the hospitalist that took care of you is not available. Once you are discharged, your primary care physician will handle any further medical issues. Please note that NO REFILLS for any discharge medications will be authorized once you are discharged, as it is imperative that you return to your primary care physician (or establish a relationship with a primary care physician if you do not have one) for your aftercare needs so that they can reassess your need for medications and monitor your lab values.

## 2014-06-08 NOTE — Progress Notes (Signed)
Clinical Social Work Department BRIEF PSYCHOSOCIAL ASSESSMENT 06/08/2014  Patient:  [name], [name]     Account Number:  [id number]     Admit date:  06/06/2014  Clinical Social Worker:  Earlie Server  Date/Time:  06/08/2014 01:30 PM  Referred by:  Physician  Date Referred:  06/08/2014 Referred for  SNF Placement   Other Referral:   Interview type:  Patient Other interview type:    PSYCHOSOCIAL DATA Living Status:  FAMILY Admitted from facility:   Level of care:   Primary support name:  [name] Primary support relationship to patient:  CHILD, ADULT Degree of support available:   Strong    CURRENT CONCERNS Current Concerns  Post-Acute Placement   Other Concerns:    SOCIAL WORK ASSESSMENT / PLAN CSW received referral in order to assist with DC planning. CSW reviewed chat and met with patient and friend at bedside. CSW introduced myself and explained role. Patient reports friend at bedside is her best friend and can be involved in assessment.    Patient reports that she lives at home and has strong support from sister and dtr. Patient reports that she has been having knee pain which has caused her to rely more heavily on friends and family for support. CSW spoke with patient re: OT recommendations for SNF placement. CSW explained SNF process and offered SNF list but patient politely declined. Patient reports her husband was in and out of the hospital before he passed away and she does not want to go to SNF. Patient reports she has some equipment at home and that friends and family can stay with her 24/7 until she is feeling better. Patient is agreeable to [facility] and might need additional equipment. Friend at bedside agreeable to plan and reports that she is agreeable to help patient and stay the night if needed.    CSW and patient discussed safety concerns but patient reports she is young and in good shape. Patient reports that she will be safe and that family will ensure she has  the support she needs. Patient agreeable to [facility] so CSW left a message with CM. CSW is signing off but available if needed.   Assessment/plan status:  No Further Intervention Required Other assessment/ plan:   Information/referral to community resources:   SNF information    PATIENT'S/FAMILY'S RESPONSE TO PLAN OF CARE: Patient and friend engaged in assessment. Patient laughing and joking throughout assessment. Patient thanked CSW for visit but reports she will never go to a nursing home. Patient reports that she is agreeable to talk with CM. Patient reports no safety concerns and feels confident that her family can provide the care she needs.       [place], [place] [phone]

## 2014-06-08 NOTE — Progress Notes (Signed)
Report from [place], [place] and care assumed for pt at this time. Pt sitting up in chair, denies pain/nausea/discomfort. Soda and blanket provided at pt request. Assessment as charted.

## 2014-06-08 NOTE — Progress Notes (Signed)
PT Cancellation Note  Patient Details Name: AYME SHORT MRN: [id number] DOB: 05-Feb-1946   Cancelled Treatment:    Reason Eval/Treat Not Completed: PT screened, no needs identified, will sign off-- attempted x 3 to perform PT eval. On 3rd attempt, pt politely declined PT eval . Pt states she is discharging home now and she does not want PT eval. Thanks. Will sign off.    Weston Anna, MPT Pager: [phone]

## 2014-06-08 NOTE — Care Management Note (Signed)
    Page 1 of 2   06/08/2014     3:35:23 PM CARE MANAGEMENT NOTE 06/08/2014  Patient:  [name], [name]   Account Number:  [id number]  Date Initiated:  06/08/2014  Documentation initiated by:  DAVIS,RHONDA  Subjective/Objective Assessment:   leg pain and knee pain unable to bear wt     Action/Plan:   home with hhc   Anticipated DC Date:  06/08/2014   Anticipated DC Plan:  [place] referral  Clinical Social Worker      DC Planning Services  CM consult      [facility] Choice  NA   Choice offered to / List presented to:  C-1 Patient   DME arranged  Vassie Moselle      DME agency  Center arranged  [place]   Status of service:  Completed, signed off Medicare Important Message given?  NA - LOS <3 / Initial given by admissions (If response is "NO", the following Medicare IM given date fields will be blank) Date Medicare IM given:   Medicare IM given by:   Date Additional Medicare IM given:   Additional Medicare IM given by:    Discharge Disposition:  [place]  Per UR Regulation:  Reviewed for med. necessity/level of care/duration of stay  If discussed at [place] of Stay Meetings, dates discussed:    Comments:  Velva Harman, RN,BSN,CCM:

## 2014-06-08 NOTE — Progress Notes (Addendum)
Physical medicine and rehabilitation consult requested chart reviewed. Patient is presently on observation status will not qualify for inpatient rehabilitation services at this time. Hold on formal rehabilitation consult

## 2014-06-08 NOTE — Progress Notes (Signed)
RW delivered to pt in room by rep from [facility].

## 2014-06-08 NOTE — Evaluation (Signed)
Occupational Therapy Evaluation Patient Details Name: [name] MRN: [id number] DOB: 05-05-1946 Today's Date: 06/08/2014    History of Present Illness 67 year old female with history of hypertension, diabetes mellitus, chronic right knee pain for which she receives intermittent injections from Dr. Paulo Fruit in [place] [facility] presented to the ED with growing right knee pain. She was seen by her orthopedist in about 3 weeks back for right leg pain and redness and had a negative Doppler of her lower extremity. She was given a prescription for Levaquin for possible cellulitis. She was given an intra-articular injection in her right knee and right foot for pain and arthritis symptoms. Although the right foot pain improved a right knee pain symptoms worsened and was swollen. She was unable to bear weight due to pain and has been restricted to bed. She reports intermittent fevers with chills and generalized weakness. He reports having a fever 102F 2 days ago. She called EMS to come to the hospital and was found to have CBG of 49 when checked by EMS.   Clinical Impression   Pt admitted with R leg pain and hypoglycemia. Pt currently with functional limitations due to the deficits listed below.  Pt will benefit from skilled OT to increase their safety and independence with ADL and functional mobility for ADL to facilitate discharge to venue listed below.      Follow Up Recommendations  SNF    Equipment Recommendations  3 in 1 bedside comode    Recommendations for Other Services       Precautions / Restrictions Precautions Precautions: Fall      Mobility Bed Mobility Overal bed mobility: Needs Assistance Bed Mobility: Supine to Sit     Supine to sit: Min assist        Transfers Overall transfer level: Needs assistance Equipment used: 2 person hand held assist Transfers: Sit to/from Stand;Stand Pivot Transfers   Stand pivot transfers: Max assist;+2 physical assistance        General transfer comment: verbal cues for safety, hand placment and weight shifting    Balance                                            ADL Overall ADL's : Needs assistance/impaired     Grooming: Minimal assistance;Sitting   Upper Body Bathing: Minimal assitance;Sitting   Lower Body Bathing: Sit to/from stand;Cueing for safety;+2 for physical assistance;Maximal assistance   Upper Body Dressing : Moderate assistance;Sitting   Lower Body Dressing: Sit to/from stand;+2 for physical assistance;Maximal assistance   Toilet Transfer: Maximal assistance;+2 for physical assistance Toilet Transfer Details (indicate cue type and reason): bed to chair Toileting- Clothing Manipulation and Hygiene: Maximal assistance;+2 for physical assistance;Sit to/from stand       Functional mobility during ADLs: Maximal assistance;+2 for physical assistance General ADL Comments: increased time and verbal cues to perform all activity                Pertinent Vitals/Pain Pain Assessment: 0-10 Pain Score: 6  Pain Location: R foot and knee Pain Intervention(s): Limited activity within patient's tolerance;Patient requesting pain meds-RN notified     Hand Dominance     Extremity/Trunk Assessment Upper Extremity Assessment Upper Extremity Assessment: Generalized weakness           Communication Communication Communication: No difficulties   Cognition Arousal/Alertness: Awake/alert Behavior During Therapy: WFL for tasks assessed/performed Overall  Cognitive Status: No family/caregiver present to determine baseline cognitive functioning                     General Comments    increased time to perform activity including taking meds with nurse            [place] expects to be discharged to:: Private residence Living Arrangements: Other relatives Available Help at Discharge: Family Type of Home: House Home Access: Stairs to  enter [employer] of Steps: [place]: Two level;Able to live on main level with bedroom/bathroom Alternate Level Stairs-Number of Steps: pt has been staying in den Alternate Level Stairs-Rails: Can reach both Bathroom Shower/Tub: Tub/shower unit;Walk-in shower (both upstairs)   Bathroom Toilet: Standard         Additional Comments: has a borrowed cane but nothing that is hers      Prior Functioning/Environment Level of Independence: Independent with assistive device(s)             OT Diagnosis: Generalized weakness;Acute pain   OT Problem List: Decreased strength;Decreased activity tolerance;Pain   OT Treatment/Interventions: Self-care/ADL training;DME and/or AE instruction;Patient/family education    OT Goals(Current goals can be found in the care plan section) Acute Rehab OT Goals Patient Stated Goal: back home with grandaughter to help her OT Goal Formulation: With patient Time For Goal Achievement: 06/22/14 Potential to Achieve Goals: Good  OT Frequency: Min 2X/week              End of Session    Activity Tolerance: Patient tolerated treatment well Patient left: in chair;with call bell/phone within reach;with nursing/sitter in room   Time: 1145-1209 OT Time Calculation (min): 24 min Charges:  OT General Charges $OT Visit: 1 Procedure OT Evaluation $Initial OT Evaluation Tier I: 1 Procedure OT Treatments $Self Care/Home Management : 8-22 mins G-Codes: OT G-codes **NOT FOR INPATIENT CLASS** Functional Assessment Tool Used: clinical observation Functional Limitation: Self care Self Care Current Status (X6468): At least 60 percent but less than 80 percent impaired, limited or restricted Self Care Goal Status (E3212): At least 1 percent but less than 20 percent impaired, limited or restricted  [place], Thereasa Parkin 06/08/2014, 12:17 PM

## 2014-06-08 NOTE — Discharge Summary (Signed)
[name], 68 y.o., DOB 1945/12/23, MRN [id number]. Admission date: 06/06/2014 Discharge Date 06/08/2014 Primary MD Elyn Peers, MD Admitting Physician Janece Canterbury, MD  Admission Diagnosis  Severe protein-calorie malnutrition [262] Hypoglycemia [251.2] UTI (lower urinary tract infection) [599.0] Mass of right side of neck [784.2]  Discharge Diagnosis   Active Problems:   Hypoglycemia   Fever   ECG abnormal   Knee pain, chronic   Mass of right side of neck   Severe protein-calorie malnutrition   Microcytic anemia   Malnutrition of moderate degree   Acute pericarditis, unspecified      Past Medical History  Diagnosis Date  . Hypertension   . Diabetes mellitus   . Chronic knee pain     right  . Polio     Past Surgical History  Procedure Laterality Date  . Abdominal hysterectomy      Brief narrative  68 year old female with history of hypertension, diabetes mellitus, chronic right knee pain for which she receives intermittent injections from Dr. Paulo Fruit in [place] [facility] presented to the ED with growing right knee pain. She was seen by her orthopedist in about 3 weeks back for right leg pain and redness and had a negative Doppler of her lower extremity. She was given a prescription for Levaquin for possible cellulitis. She was given an intra-articular injection in her right knee and right foot for pain and arthritis symptoms. Although the right foot pain improved a right knee pain symptoms worsened and was swollen. She was unable to bear weight due to pain and has been restricted to bed. She reports intermittent fevers with chills and generalized weakness. He reports having a fever 102F 2 days prior to admission. She called EMS to come to the hospital and was found to have CBG of 49 when checked by EMS.  Patient admitted for right knee pain, hypoglycemia and UTI.  Hospital Course See H&P, Labs, Consult and Test reports for all details in brief,  Active  Problems:   Hypoglycemia   Fever   ECG abnormal   Knee pain, chronic   Mass of right side of neck   Severe protein-calorie malnutrition   Microcytic anemia   Malnutrition of moderate degree   Acute pericarditis, unspecified   Assessment/Plan:  Acute on chronic right knee pain  - Due to End-stage arthritis of right knee  - Patient has difficulty walking with pain and swelling with failure to thrive for past several weeks.  Orthopedics consulted and an intra-articular injection of Xylocaine,, Marcaine and Celestone was given on 9/29.  PT eval. Pain control with when necessary Vicodin.  Hypoglycemia  Secondary to poor by mouth intake. No A1c of 5.3 and is not on any medications. Monitor blood glucose closely. Appreciate nutrition consult and added supplements. Thyroid function. A.m. Cortisol within normal limit.  Failure to thrive and intermittent fevers/ with a suspicion of acute pericarditis  Patient has been stable since admission. No clinical signs of infection so far. Follow blood culture. patient has findings of diffuse ST elevation on EKG. Denies chest pain symptoms.Serial troponin have been negative. 2-D echo was done showing normal ejection fraction, and no pericardial effusion - placed on scheduled ibuprofen 800 mg every 8 hours , will change to when necessary.  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  - S/P ULTRASOUND GUIDED RIGHT  NECK MASS BIOPSY, biopsy showing pleomorphic adenoma. Discussed over the phone with him cloche on call Dr. Marin Olp. Right paratracheal mass  As seen on CT of the chest which compared to CT neck from 2007 appears unchanged and suggest likely an exophytic thyroid mass and substernal goiter. Thyroid function normal.  Severe protein calorie malnutrition  Appreciate nutrition consult and  added supplements.  Hypertension  Continue home medications  Type 2 diabetes mellitus  A1c of 5.3. Not in any medications and hypoglycemic due to poor by mouth intake  Microcytic anemia  In panel suggest iron deficiency. We'll add supplement   Question of UTI  Not started on abx. Has had few squamous cell and epithelium, sotalol clean-catch sample, urine culture showing no growth, so patient was not started on any antibiotics  Diet: Regular  Code Status: Full code  Family Communication: Patient is awake and oriented Disposition Plan: Home   Consultants:  Orthopedics  IR Procedures:  intra articular injection of steroid and local anesthetic  Right parotid biopsy Antibiotics:  None   Significant Tests:  See full reports for all details    Dg Chest 2 View  06/06/2014   CLINICAL DATA:  Weakness, hypertension, diabetes.  EXAM: CHEST  2 VIEW  COMPARISON:  02/17/2009  FINDINGS: Right peritracheal masslike density again noted, stable since 2010. Heart is normal size. Lungs are clear. No effusions. No acute bony abnormality.  IMPRESSION: Stable right paratracheal mass density. 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 No change since 2010. This could be further confirmed with chest CT.  No active disease.   Electronically Signed   By: Rolm Baptise M.D.   On: 06/06/2014 12:07   Ct Soft Tissue Neck W Contrast  06/07/2014   CLINICAL DATA:  Right lateral neck mass located below the right ear. Paratracheal mass.  EXAM: CT NECK WITH CONTRAST  TECHNIQUE: Multidetector CT imaging of the neck was performed using the standard protocol following the bolus administration of intravenous contrast.  CONTRAST:  154mL OMNIPAQUE IOHEXOL 300 MG/ML  SOLN  COMPARISON:  CT neck with contrast 01/10/2006  FINDINGS: A peripherally enhancing lesion within the inferior aspect of the right parotid gland demonstrates interval growth, now measuring 2.8 x 3.7 x 4.2 cm. The lesion appears to be contained within the parotid  gland without extracapsular extension. No significant associated adenopathy is evident.  The left parotid gland is within normal limits. The submandibular glands are normal bilaterally. There is some heterogeneity of the palatine tonsils with calcifications compatible with remote infection.  No focal mucosal or submucosal lesions are evident.  The vocal cords are midline and symmetric. A thyroid goiter is again seen with substernal descent on the left. Similar density soft tissue is noted along the right peritracheal station deep into the mediastinum. Both the left lobe of thyroid and right peritracheal lesion demonstrate only slight interval growth over the last 8 years. Degenerative changes of the cervical spine are stable. The lung apices are clear.  IMPRESSION: 1. 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 There are no aggressive features. 2. No significant adenopathy. 3. Minimal interval growth of a thyroid goiter. 4. Minimal interval growth of a right peritracheal mass. This is most likely related to the thyroid goiter although a direct connection is difficult to identify.   Electronically Signed   By: Lawrence Santiago M.D.   On:  06/07/2014 11:45   Ct Chest W Contrast  06/07/2014   ADDENDUM REPORT: 06/07/2014 11:15  ADDENDUM: Unknown at the time of the original Chest CT interpretation is that there is an old neck CT from 01/10/2006. Comparison is made to that study. At that time, there was a large right paratracheal mass which is likely not significantly changed. Given that this is unchanged since 2007, I suspect this represents an exophytic thyroid mass and substernal goiter despite the fact that direct communication cannot be visualized by CT. This does not explain the precarinal adenopathy. May consider PET CT or transbronchial biopsy for tissue diagnosis.    Electronically Signed   By: Rolm Baptise M.D.   On: 06/07/2014 11:15   06/07/2014   CLINICAL DATA:  Possible paratracheal mass.  EXAM: CT CHEST WITH CONTRAST  TECHNIQUE: Multidetector CT imaging of the chest was performed during intravenous contrast administration.  CONTRAST:  126mL OMNIPAQUE IOHEXOL 300 MG/ML  SOLN  COMPARISON:  Chest x-ray performed 06/06/2014.  FINDINGS: There is a large right paratracheal mass measuring 5.8 x 5.8 cm on image 19. This extends to near the right lobe with the thyroid but appears to be separate from the thyroid on reconstructed images in addition, there is a large left thyroid lobe mass which measures 4.5 x 4.1 cm.  No hilar or axillary adenopathy. No visible supraclavicular adenopathy.  Heart is normal size. Aorta is normal caliber. Chest wall soft tissues are unremarkable. Imaging into the upper abdomen shows no acute findings.  Linear densities in the lung bases compatible with atelectasis or scarring. Lungs are otherwise clear. No pleural effusions or pericardial effusion.  Degenerative changes in the thoracic spine. Adjacent precarinal/ AP window nodal mass measures 3.3 x 3.2 cm on image 25.  IMPRESSION: Enlargement of the left thyroid lobe with 4.5 cm left thyroid mass, nonspecific. This could be further evaluated with thyroid ultrasound.  Right paratracheal mass appears separate from the thyroid and likely represents adenopathy. Adenopathy also noted in the region of the AP window and precarinal region. Cannot exclude metastatic disease or a lymphoproliferative disorder.  Electronically Signed: By: Rolm Baptise M.D. On: 06/07/2014 10:31   US Biopsy  06/07/2014   INDICATION: 68 year old female with a right neck mass related to the parotid gland. She has been referred for biopsy  EXAM: ULTRASOUND GUIDED RIGHT NECK MASS BIOPSY  COMPARISON:  CT of the chest and neck 06/07/2014  MEDICATIONS: Fentanyl 50 mcg IV; Versed 1.5 mg IV  ANESTHESIA/SEDATION: Total Moderate Sedation  time  Eight minutes  COMPLICATIONS: None immediate  PROCEDURE: Informed written consent was obtained from the patient after a discussion of the risks, benefits and alternatives to treatment. The patient understands and consents the procedure. A timeout was performed prior to the initiation of the procedure.  Ultrasound scanning was performed of the RIGHT NECK. Images were stored and sent to PACs.  The patient was then prepped and draped in usual sterile fashion, and 1% lidocaine was used for infiltration of the skin and subcutaneous tissues for local anesthesia. A small stab incision was made with 11 blade scalpel. Using ultrasound guidance, a 18 gauge core biopsy gun was advanced into the right neck mass with 2 separate 18 gauge core biopsies retrieved. Both grayscale and color ultrasound was used the for each biopsy.  Final images were stored after retrieval of the separate biopsies.  The patient tolerated the procedure well and remained hemodynamically stable throughout.  No complications were encountered and no significant blood loss  was encounter.  Specimen were sent to pathology for analysis.  IMPRESSION: Status post ultrasound-guided biopsy of right neck mass near the parotid. Tissue specimen sent to pathology for complete histopathologic analysis.  Signed,  Dulcy Fanny. Earleen Newport, DO  [facility]  [facility]   Electronically Signed   By: Corrie Mckusick O.D.   On: 06/07/2014 17:26   Dg Knee Complete 4 Views Right  06/06/2014   CLINICAL DATA:  Chronic right knee pain.  No known injury.  EXAM: RIGHT KNEE - COMPLETE 4+ VIEW  COMPARISON:  None.  FINDINGS: No acute bony or joint abnormality is identified. The patient has severe tricompartmental osteoarthritis appearing worst in the lateral and patellofemoral compartments. No joint effusion is identified.  IMPRESSION: No acute finding.  Severe tricompartmental osteoarthritis.   Electronically Signed   By: Inge Rise  M.D.   On: 06/06/2014 12:07     Today   Subjective:   [name] today has no headache,no chest abdominal pain,no new weakness tingling or numbness.  Objective:   Blood pressure 99/72, pulse 111, temperature 97.7 F (36.5 C), temperature source Oral, resp. rate 18, height 5\' 7"  (1.702 m), weight 95.255 kg (210 lb), SpO2 100.00%.  Intake/Output Summary (Last 24 hours) at 06/08/14 1535 Last data filed at 06/07/14 1832  Gross per 24 hour  Intake      0 ml  Output      0 ml  Net      0 ml    Exam:  General: Elderly female lying in bed in no acute distress  HEENT: No pallor, moist oral mucosa, mass over right anterior neck extending from right parotid, nontender  Cardiovascular: Normal W0-J8, 2/6 systolic murmur  Respiratory: Clear to auscultation bilaterally, no added sounds  Abdomen: Soft, nondistended, nontender, bowel sounds present  Extremities: Warm, swelling and tenderness over right knee with limited range of motion  CNS: Alert and oriented   Data Review   Cultures -  Results for orders placed during the hospital encounter of 06/06/14  URINE CULTURE     Status: None   Collection Time    06/06/14  2:17 PM      Result Value Ref Range Status   Specimen Description URINE, CATHETERIZED   Final   Special Requests NONE   Final   Culture  Setup Time     Final   Value: 06/06/2014 17:19     Performed at [place]     Final   Value: NO GROWTH     Performed at [facility]   Culture     Final   Value: NO GROWTH     Performed at [facility]   Report Status 06/07/2014 FINAL   Final  CULTURE, BLOOD (ROUTINE X 2)     Status: None   Collection Time    06/06/14  4:55 PM      Result Value Ref Range Status   Specimen Description BLOOD RIGHT ARM   Final   Special Requests BOTTLES DRAWN AEROBIC AND ANAEROBIC 5CC   Final   Culture  Setup Time     Final   Value: 06/06/2014 22:09     Performed at [facility]   Culture      Final   Value:        BLOOD CULTURE RECEIVED NO GROWTH TO DATE CULTURE WILL BE HELD FOR 5 DAYS BEFORE ISSUING A FINAL NEGATIVE REPORT  Performed at [facility]   Report Status PENDING   Incomplete  CULTURE, BLOOD (ROUTINE X 2)     Status: None   Collection Time    06/06/14  5:10 PM      Result Value Ref Range Status   Specimen Description BLOOD RIGHT ARM   Final   Special Requests BOTTLES DRAWN AEROBIC ONLY 5CC   Final   Culture  Setup Time     Final   Value: 06/06/2014 22:10     Performed at [facility]   Culture     Final   Value:        BLOOD CULTURE RECEIVED NO GROWTH TO DATE CULTURE WILL BE HELD FOR 5 DAYS BEFORE ISSUING A FINAL NEGATIVE REPORT     Performed at [facility]   Report Status PENDING   Incomplete      CBC w Diff: Lab Results  Component Value Date   WBC 5.4 06/08/2014   HGB 10.0* 06/08/2014   HCT 31.9* 06/08/2014   PLT 263 06/08/2014   LYMPHOPCT 18 06/06/2014   MONOPCT 8 06/06/2014   EOSPCT 1 06/06/2014   BASOPCT 0 06/06/2014   CMP: Lab Results  Component Value Date   NA 139 06/08/2014   K 4.1 06/08/2014   CL 100 06/08/2014   CO2 26 06/08/2014   BUN 18 06/08/2014   CREATININE 0.75 06/08/2014   PROT 7.7 06/06/2014   ALBUMIN 3.1* 06/06/2014   BILITOT 0.9 06/06/2014   ALKPHOS 70 06/06/2014   AST 12 06/06/2014   ALT 6 06/06/2014  .  Micro Results Recent Results (from the past 240 hour(s))  URINE CULTURE     Status: None   Collection Time    06/06/14  2:17 PM      Result Value Ref Range Status   Specimen Description URINE, CATHETERIZED   Final   Special Requests NONE   Final   Culture  Setup Time     Final   Value: 06/06/2014 17:19     [facility]formed at [facility]     Final   Value: NO GROWTH     Performed at [facility]   Culture     Final   Value: NO GROWTH     Performed at [facility]   Report Status 06/07/2014 FINAL   Final  CULTURE, BLOOD (ROUTINE X 2)     Status: None   Collection  Time    06/06/14  4:55 PM      Result Value Ref Range Status   Specimen Description BLOOD RIGHT ARM   Final   Special Requests BOTTLES DRAWN AEROBIC AND ANAEROBIC 5CC   Final   Culture  Setup Time     Final   Value: 06/06/2014 22:09     Performed at [facility]   Culture     Final   Value:        BLOOD CULTURE RECEIVED NO GROWTH TO DATE CULTURE WILL BE HELD FOR 5 DAYS BEFORE ISSUING A FINAL NEGATIVE REPORT     Performed at [facility]   Report Status PENDING   Incomplete  CULTURE, BLOOD (ROUTINE X 2)     Status: None   Collection Time    06/06/14  5:10 PM      Result Value Ref Range Status   Specimen Description BLOOD RIGHT ARM   Final   Special Requests BOTTLES DRAWN AEROBIC ONLY 5CC  Final   Culture  Setup Time     Final   Value: 06/06/2014 22:10     Performed at [facility]   Culture     Final   Value:        BLOOD CULTURE RECEIVED NO GROWTH TO DATE CULTURE WILL BE HELD FOR 5 DAYS BEFORE ISSUING A FINAL NEGATIVE REPORT     Performed at [facility]   Report Status PENDING   Incomplete     Discharge Instructions          Follow-up Information   Follow up with Elyn Peers, MD In 1 week.   Specialty:  Family Medicine   Contact information:   [address] [place] [address] [phone]       Discharge Medications     Medication List         hydrochlorothiazide 25 MG tablet  Commonly known as:  HYDRODIURIL  Take 25 mg by mouth daily.     HYDROcodone-acetaminophen 5-325 MG per tablet  Commonly known as:  NORCO/VICODIN  Take 1 tablet by mouth every 6 (six) hours as needed for severe pain.     irbesartan 300 MG tablet  Commonly known as:  AVAPRO  Take 300 mg by mouth daily.     traMADol 50 MG tablet  Commonly known as:  ULTRAM  Take 50 mg by mouth every 6 (six) hours as needed for moderate pain or severe pain.         Total Time in preparing paper work, data evaluation and todays exam - 35  minutes  Gor Vestal M.D on 06/08/2014 at 3:35 PM  [facility] Office  [phone]

## 2014-06-08 NOTE — Progress Notes (Signed)
Clinical Social Work  Patient requested PTAR to assist with transportation home. Patient aware of no guarantee of payment and reports that family will be at home to support her. PTAR forms completed and placed on chart. PTAR arranged for 5:30pm pick up. PTAR #: [id number].  CSW is signing off but available if needed.  [place], [place] [phone]

## 2014-06-12 LAB — CULTURE, BLOOD (ROUTINE X 2)
Culture: NO GROWTH
Culture: NO GROWTH

## 2014-11-04 DIAGNOSIS — B351 Tinea unguium: Secondary | ICD-10-CM | POA: Diagnosis not present

## 2014-11-04 DIAGNOSIS — M79609 Pain in unspecified limb: Secondary | ICD-10-CM | POA: Diagnosis not present

## 2014-11-04 DIAGNOSIS — M25572 Pain in left ankle and joints of left foot: Secondary | ICD-10-CM | POA: Diagnosis not present

## 2014-11-04 DIAGNOSIS — M12272 Villonodular synovitis (pigmented), left ankle and foot: Secondary | ICD-10-CM | POA: Diagnosis not present

## 2014-11-04 DIAGNOSIS — M7752 Other enthesopathy of left foot: Secondary | ICD-10-CM | POA: Diagnosis not present

## 2015-01-19 DIAGNOSIS — M79672 Pain in left foot: Secondary | ICD-10-CM | POA: Diagnosis not present

## 2015-01-19 DIAGNOSIS — M84475A Pathological fracture, left foot, initial encounter for fracture: Secondary | ICD-10-CM | POA: Diagnosis not present

## 2015-01-19 DIAGNOSIS — I739 Peripheral vascular disease, unspecified: Secondary | ICD-10-CM | POA: Diagnosis not present

## 2015-01-19 DIAGNOSIS — L603 Nail dystrophy: Secondary | ICD-10-CM | POA: Diagnosis not present

## 2015-01-19 DIAGNOSIS — M84475G Pathological fracture, left foot, subsequent encounter for fracture with delayed healing: Secondary | ICD-10-CM | POA: Diagnosis not present

## 2015-01-19 DIAGNOSIS — E1151 Type 2 diabetes mellitus with diabetic peripheral angiopathy without gangrene: Secondary | ICD-10-CM | POA: Diagnosis not present

## 2015-02-04 ENCOUNTER — Emergency Department (HOSPITAL_COMMUNITY)
Admission: EM | Admit: 2015-02-04 | Discharge: 2015-02-04 | Disposition: A | Discharge status: Home / Self Care | Payer: Medicare Other | Attending: Emergency Medicine | Admitting: Emergency Medicine

## 2015-02-04 ENCOUNTER — Encounter (HOSPITAL_COMMUNITY): Payer: Self-pay | Admitting: Emergency Medicine

## 2015-02-04 DIAGNOSIS — M79662 Pain in left lower leg: Secondary | ICD-10-CM | POA: Diagnosis not present

## 2015-02-04 DIAGNOSIS — Z880 Allergy status to penicillin: Secondary | ICD-10-CM | POA: Diagnosis not present

## 2015-02-04 DIAGNOSIS — M79661 Pain in right lower leg: Secondary | ICD-10-CM | POA: Insufficient documentation

## 2015-02-04 DIAGNOSIS — I10 Essential (primary) hypertension: Secondary | ICD-10-CM | POA: Insufficient documentation

## 2015-02-04 DIAGNOSIS — Z87891 Personal history of nicotine dependence: Secondary | ICD-10-CM | POA: Insufficient documentation

## 2015-02-04 DIAGNOSIS — G8929 Other chronic pain: Secondary | ICD-10-CM | POA: Diagnosis not present

## 2015-02-04 DIAGNOSIS — M79606 Pain in leg, unspecified: Secondary | ICD-10-CM

## 2015-02-04 DIAGNOSIS — Z8619 Personal history of other infectious and parasitic diseases: Secondary | ICD-10-CM | POA: Diagnosis not present

## 2015-02-04 DIAGNOSIS — E119 Type 2 diabetes mellitus without complications: Secondary | ICD-10-CM | POA: Diagnosis not present

## 2015-02-04 DIAGNOSIS — M25561 Pain in right knee: Secondary | ICD-10-CM | POA: Diagnosis not present

## 2015-02-04 LAB — CBC WITH DIFFERENTIAL/PLATELET
Basophils Absolute: 0 10*3/uL (ref 0.0–0.1)
Basophils Relative: 0 % (ref 0–1)
EOS PCT: 1 % (ref 0–5)
Eosinophils Absolute: 0 10*3/uL (ref 0.0–0.7)
HEMATOCRIT: 33 % — AB (ref 36.0–46.0)
Hemoglobin: 10.4 g/dL — ABNORMAL LOW (ref 12.0–15.0)
Lymphocytes Relative: 29 % (ref 12–46)
Lymphs Abs: 1.3 10*3/uL (ref 0.7–4.0)
MCH: 22.8 pg — AB (ref 26.0–34.0)
MCHC: 31.5 g/dL (ref 30.0–36.0)
MCV: 72.2 fL — AB (ref 78.0–100.0)
MONO ABS: 0.4 10*3/uL (ref 0.1–1.0)
Monocytes Relative: 8 % (ref 3–12)
NEUTROS ABS: 2.9 10*3/uL (ref 1.7–7.7)
NEUTROS PCT: 62 % (ref 43–77)
PLATELETS: 212 10*3/uL (ref 150–400)
RBC: 4.57 MIL/uL (ref 3.87–5.11)
RDW: 14.8 % (ref 11.5–15.5)
WBC: 4.6 10*3/uL (ref 4.0–10.5)

## 2015-02-04 LAB — COMPREHENSIVE METABOLIC PANEL
ALT: 10 U/L — ABNORMAL LOW (ref 14–54)
AST: 17 U/L (ref 15–41)
Albumin: 3.3 g/dL — ABNORMAL LOW (ref 3.5–5.0)
Alkaline Phosphatase: 77 U/L (ref 38–126)
Anion gap: 11 (ref 5–15)
BILIRUBIN TOTAL: 1 mg/dL (ref 0.3–1.2)
BUN: 9 mg/dL (ref 6–20)
CO2: 25 mmol/L (ref 22–32)
Calcium: 9.2 mg/dL (ref 8.9–10.3)
Chloride: 102 mmol/L (ref 101–111)
Creatinine, Ser: 0.82 mg/dL (ref 0.44–1.00)
GFR calc Af Amer: >60 mL/min (ref >60)
GFR calc non Af Amer: >60 mL/min (ref >60)
Glucose, Bld: 75 mg/dL (ref 65–99)
POTASSIUM: 3.8 mmol/L (ref 3.5–5.1)
SODIUM: 138 mmol/L (ref 135–145)
Total Protein: 7.3 g/dL (ref 6.5–8.1)

## 2015-02-04 MED ORDER — HYDROCODONE-ACETAMINOPHEN 5-325 MG PO TABS
2.0000 | ORAL_TABLET | Freq: Once | ORAL | Status: AC
  Administered 2015-02-04: 2 via ORAL
  Filled 2015-02-04: qty 2

## 2015-02-04 MED ORDER — HYDROCODONE-ACETAMINOPHEN 5-325 MG PO TABS: 1.0000 | ORAL_TABLET | Freq: Four times a day (QID) | ORAL | Status: DC | PRN

## 2015-02-04 NOTE — ED Notes (Signed)
Pt. reports bilateral knee pain radiating to lower legs onset this week , denies injury or fall , respirations unlabored / no fever or chills.

## 2015-02-04 NOTE — ED Provider Notes (Signed)
CSN: [id number]     Arrival date & time 02/04/15  2051 History   First MD Initiated Contact with Patient 02/04/15 2119     Chief Complaint  Patient presents with  . Leg Pain    (Consider location/radiation/quality/duration/timing/severity/associated sxs/prior Treatment) HPI Comments: Patient is a 69 year old female with a history of hypertension, diabetes mellitus, chronic right knee pain, and polio. She presents to the emergency department for further evaluation of bilateral lower extremity pain. She describes the pain as sharp in nature. She denies any radiation of pain. Symptoms associated with bilateral lower extremity swelling; R>L. She states this is usual for her. She is on HCTZ, but reports that it has not changed her swelling much. Patient is most often wheelchair-bound, though she does state that she walks with a cane at times. She denies any change in her ambulation status. No history of trauma or injury to her bilateral lower extremities. No falls. Patient further denies fever and sensation loss. Patient sees a podiatrist for her chronic leg and knee pain. She had a negative doppler study b/l in 05/2014.  PCP - Dr. Criss Rosales  Patient is a 69 y.o. female presenting with leg pain. The history is provided by the patient and a relative. No language interpreter was used.  Leg Pain Associated symptoms: no fever     Past Medical History  Diagnosis Date  . Hypertension   . Diabetes mellitus   . Chronic knee pain     right  . Polio    Past Surgical History  Procedure Laterality Date  . Abdominal hysterectomy     Family History  Problem Relation Age of Onset  . Diabetes Mellitus II Neg Hx   . Hypertension    . High Cholesterol    . Ovarian cancer Mother   . Lupus Neg Hx   . Sarcoidosis Neg Hx    History  Substance Use Topics  . Smoking status: Former Smoker -- 0.50 packs/day    Types: Cigarettes    Quit date: 09/09/1968  . Smokeless tobacco: Never Used  . Alcohol Use: No    OB History    No data available      Review of Systems  Constitutional: Negative for fever.  Cardiovascular: Positive for leg swelling.  Genitourinary: Negative for dysuria.  Musculoskeletal: Positive for myalgias and arthralgias.  Neurological: Negative for numbness.  All other systems reviewed and are negative.   Allergies  Orange concentrate; Peach; Penicillins; Strawberry; and Pineapple  Home Medications   Prior to Admission medications   Medication Sig Start Date End Date Taking? Authorizing Provider  traMADol (ULTRAM) 50 MG tablet Take 50 mg by mouth every 6 (six) hours as needed for moderate pain or severe pain.   Yes Historical Provider, MD  HYDROcodone-acetaminophen (NORCO/VICODIN) 5-325 MG per tablet Take 1-2 tablets by mouth every 6 (six) hours as needed for severe pain. 02/04/15   Antonietta Breach, PA-C  ibuprofen (ADVIL) 200 MG tablet Take 1 tablet (200 mg total) by mouth every 6 (six) hours as needed for fever or moderate pain. 06/08/14   Silver Huguenin Elgergawy, MD   BP 101/84 mmHg  Pulse 99  Temp(Src) 99.4 F (37.4 C) (Oral)  Resp 24  Ht 5\' 6"  (1.676 m)  Wt 164 lb (74.39 kg)  BMI 26.48 kg/m2  SpO2 99%   Physical Exam  Constitutional: She is oriented to person, place, and time. She appears well-developed and well-nourished. No distress.  Nontoxic/nonseptic appearing  HENT:  Head: Normocephalic  and atraumatic.  Eyes: Conjunctivae and EOM are normal. No scleral icterus.  Neck: Normal range of motion.  Cardiovascular: Normal rate, regular rhythm and intact distal pulses.   DP and PT pulses 2+ b/l  Pulmonary/Chest: Effort normal. No respiratory distress.  Respirations even and unlabored  Musculoskeletal: Normal range of motion. She exhibits edema and tenderness.  1+ edema in the LLE and 2+ edema in the RLE. Mild erythema to anterior RLE, excluding foot. Decreased ROM of knee b/l, right greater than left. Patient states ROM of her extremities is at baseline. No  effusion or crepitus noted. No red linear streaking.  Neurological: She is alert and oriented to person, place, and time. She exhibits normal muscle tone. Coordination normal.  GCS 15. Sensation to light touch intact. Patient able to wiggle all toes bilaterally  Skin: Skin is warm and dry. No rash noted. She is not diaphoretic. There is erythema. No pallor.  Mild erythema to anterior RLE  Psychiatric: She has a normal mood and affect. Her behavior is normal.  Nursing note and vitals reviewed.   ED Course  Procedures (including critical care time) Labs Review Labs Reviewed  CBC WITH DIFFERENTIAL/PLATELET - Abnormal; Notable for the following:    Hemoglobin 10.4 (*)    HCT 33.0 (*)    MCV 72.2 (*)    MCH 22.8 (*)    All other components within normal limits  COMPREHENSIVE METABOLIC PANEL - Abnormal; Notable for the following:    Albumin 3.3 (*)    ALT 10 (*)    All other components within normal limits    Imaging Review No results found.   EKG Interpretation None      MDM   Final diagnoses:  Chronic leg pain, unspecified laterality    69 year old female with a history of hypertension, diabetes mellitus, chronic knee pain and polio presents to the emergency department today for further evaluation of bilateral lower extremity pain. Patient reports a history of similar symptoms. No recent trauma or injury causing worsening of her pain today. Patient is neurovascularly intact. Patient with mild swelling in her bilateral lower extremities; however, patient endorses this to be chronic. She had a negative DVT workup 6 months ago. No crepitus or deformity. No leukocytosis; no findings to suggest cellulitic process.   Patient's pain consistent with acute on chronic pain in her lower extremities. She is followed by a primary care doctor as well as a podiatrist for this. She has follow-up with her podiatrist in 5 days. Do not believe further emergent workup is indicated at this time.  Patient is stable for follow-up with her provider at this appointment. Will provide short course of Norco for pain control. Have advised patient to discontinue tramadol and taking Norco. Return precautions discussed and provided. Patient agreeable to plan with no unaddressed concerns. Patient discharged in good condition. She was seen, also, by my attending, Dr. Lacinda Axon, prior to discharge she was in agreement with this workup, assessment, management plan, and patient's stability for discharge today.   Filed Vitals:   02/04/15 2300 02/04/15 2315 02/04/15 2330 02/04/15 2331  BP: 140/69 142/87 101/84 101/84  Pulse: 100 97 102 99  Temp:      TempSrc:      Resp:    24  Height:      Weight:      SpO2: 99% 98% 98% 99%     Antonietta Breach, PA-C 02/05/15 [place], MD 02/16/15 [phone]

## 2015-02-04 NOTE — Discharge Instructions (Signed)

## 2015-05-17 ENCOUNTER — Emergency Department (HOSPITAL_COMMUNITY)
Admission: EM | Admit: 2015-05-17 | Discharge: 2015-05-17 | Disposition: A | Discharge status: Home / Self Care | Payer: Medicare Other | Attending: Emergency Medicine | Admitting: Emergency Medicine

## 2015-05-17 ENCOUNTER — Encounter (HOSPITAL_COMMUNITY): Payer: Self-pay | Admitting: Family Medicine

## 2015-05-17 DIAGNOSIS — L03115 Cellulitis of right lower limb: Secondary | ICD-10-CM | POA: Insufficient documentation

## 2015-05-17 DIAGNOSIS — Z87891 Personal history of nicotine dependence: Secondary | ICD-10-CM | POA: Diagnosis not present

## 2015-05-17 DIAGNOSIS — Z8619 Personal history of other infectious and parasitic diseases: Secondary | ICD-10-CM | POA: Insufficient documentation

## 2015-05-17 DIAGNOSIS — I10 Essential (primary) hypertension: Secondary | ICD-10-CM | POA: Insufficient documentation

## 2015-05-17 DIAGNOSIS — L03114 Cellulitis of left upper limb: Secondary | ICD-10-CM | POA: Diagnosis not present

## 2015-05-17 DIAGNOSIS — L03119 Cellulitis of unspecified part of limb: Secondary | ICD-10-CM

## 2015-05-17 DIAGNOSIS — I872 Venous insufficiency (chronic) (peripheral): Secondary | ICD-10-CM

## 2015-05-17 DIAGNOSIS — Z880 Allergy status to penicillin: Secondary | ICD-10-CM | POA: Insufficient documentation

## 2015-05-17 DIAGNOSIS — G8929 Other chronic pain: Secondary | ICD-10-CM | POA: Insufficient documentation

## 2015-05-17 DIAGNOSIS — M79604 Pain in right leg: Secondary | ICD-10-CM | POA: Diagnosis present

## 2015-05-17 DIAGNOSIS — I119 Hypertensive heart disease without heart failure: Secondary | ICD-10-CM | POA: Insufficient documentation

## 2015-05-17 LAB — CBC WITH DIFFERENTIAL/PLATELET
Basophils Absolute: 0 10*3/uL (ref 0.0–0.1)
Basophils Relative: 0 % (ref 0–1)
Eosinophils Absolute: 0.1 10*3/uL (ref 0.0–0.7)
Eosinophils Relative: 2 % (ref 0–5)
HCT: 32.3 % — ABNORMAL LOW (ref 36.0–46.0)
HEMOGLOBIN: 9.9 g/dL — AB (ref 12.0–15.0)
LYMPHS ABS: 1.7 10*3/uL (ref 0.7–4.0)
Lymphocytes Relative: 37 % (ref 12–46)
MCH: 22.8 pg — AB (ref 26.0–34.0)
MCHC: 30.7 g/dL (ref 30.0–36.0)
MCV: 74.3 fL — ABNORMAL LOW (ref 78.0–100.0)
MONOS PCT: 9 % (ref 3–12)
Monocytes Absolute: 0.4 10*3/uL (ref 0.1–1.0)
NEUTROS PCT: 51 % (ref 43–77)
Neutro Abs: 2.3 10*3/uL (ref 1.7–7.7)
Platelets: 195 10*3/uL (ref 150–400)
RBC: 4.35 MIL/uL (ref 3.87–5.11)
RDW: 15.2 % (ref 11.5–15.5)
WBC: 4.6 10*3/uL (ref 4.0–10.5)

## 2015-05-17 LAB — COMPREHENSIVE METABOLIC PANEL
ALT: 9 U/L — AB (ref 14–54)
ANION GAP: 7 (ref 5–15)
AST: 18 U/L (ref 15–41)
Albumin: 3.3 g/dL — ABNORMAL LOW (ref 3.5–5.0)
Alkaline Phosphatase: 70 U/L (ref 38–126)
BILIRUBIN TOTAL: 0.8 mg/dL (ref 0.3–1.2)
BUN: 14 mg/dL (ref 6–20)
CHLORIDE: 104 mmol/L (ref 101–111)
CO2: 30 mmol/L (ref 22–32)
Calcium: 9.2 mg/dL (ref 8.9–10.3)
Creatinine, Ser: 0.78 mg/dL (ref 0.44–1.00)
GFR calc Af Amer: >60 mL/min (ref >60)
GFR calc non Af Amer: >60 mL/min (ref >60)
GLUCOSE: 79 mg/dL (ref 65–99)
POTASSIUM: 4.3 mmol/L (ref 3.5–5.1)
SODIUM: 141 mmol/L (ref 135–145)
TOTAL PROTEIN: 6.5 g/dL (ref 6.5–8.1)

## 2015-05-17 MED ORDER — SULFAMETHOXAZOLE-TRIMETHOPRIM 800-160 MG PO TABS
1.0000 | ORAL_TABLET | Freq: Once | ORAL | Status: AC
  Administered 2015-05-17: 1 via ORAL
  Filled 2015-05-17: qty 1

## 2015-05-17 MED ORDER — SULFAMETHOXAZOLE-TRIMETHOPRIM 800-160 MG PO TABS: 1.0000 | ORAL_TABLET | Freq: Two times a day (BID) | ORAL | Status: DC

## 2015-05-17 MED ORDER — OXYCODONE-ACETAMINOPHEN 5-325 MG PO TABS
1.0000 | ORAL_TABLET | Freq: Once | ORAL | Status: AC
  Administered 2015-05-17: 1 via ORAL
  Filled 2015-05-17: qty 1

## 2015-05-17 NOTE — ED Notes (Signed)
Pt given drink and a [place] sandwich.

## 2015-05-17 NOTE — ED Notes (Signed)
Pt here for bilateral foot pain and leg pain. Sts worse on the left side. Hx of cellulitis

## 2015-05-17 NOTE — Care Management Note (Addendum)
Case Management Note  Patient Details  Name: [name] MRN: [id number] Date of Birth: [birth year]  Subjective/Objective:    Patient presented to [facility] with complaints of leg pains difficulty ambulating             Action/Plan: CM consulted by Dr. Venora Maples concerning recommendations for possible home health services. patient uses w/c at home for mobility . Patient presented c/o weakness with leg swelling sts she does not ambulate due to pain. Met with patient at bedside, provided permission to discuss care with family at bedside.Patient lives at home alone but have some family support.that will check on her daily.  Patient's goal of care is to walk with walker for short distances. She reports having difficulties with getting her w/c out of the house because her house does not have a ramp, according to patient she has missed doctor's appointment due not having the assistance to get out of the house. Discussed the recommendations for [facility] RN,PT, patient agreeable with plans, Offered choice, selected  [facility].Patient denies any DME needs  Verified demographics and faxed Referral  in to [facility] [phone], fax confirmation received.. CM also discussed [facility], thought that patient may benefit from [place], explained to patient this service is separate and does not take the place any [facility] services with [facility]. Patient verbalized understanding. Updated Dr. Venora Maples on plan. Provided patient with my contact information should any additional questions or concerns should arise. No further ED CM needs identified Expected Discharge Date:     05/17/15              Expected Discharge Plan:     HHRN,PT, [facility], Finding a new PCP  In-House Referral:     Discharge planning Services     Post Acute Care Choice:    Choice offered to:   Patient  DME Arranged:    DME Agency:     HH Arranged:   HHRN, Media Agency:     [facility]  Status of Service:   competed  Medicare Important Message Given:    Date  Medicare IM Given:    Medicare IM give by:    Date Additional Medicare IM Given:    Additional Medicare Important Message give by:     If discussed at [place] of Stay Meetings, dates discussed:    Additional CommentsLaurena Slimmer, RN 05/17/2015, 5:43 PM  [phone]

## 2015-05-17 NOTE — ED Notes (Signed)
Mariann Laster with case management at bedside.

## 2015-05-17 NOTE — ED Provider Notes (Signed)
CSN: [id number]     Arrival date & time 05/17/15  1153 History   First MD Initiated Contact with Patient 05/17/15 1321     Chief Complaint  Patient presents with  . Foot Pain  . Leg Pain    HPI Patient presents complaining of bilateral foot and leg pain with associated lower leg swelling.  She is a diabetic.  She has seen a podiatrist over the past year for issues with her toenails.  She feels like her right great toenail is slightly raised.  No drainage.  Denies fevers and chills.  She has some improvement in her swelling with elevation of her legs.  She feels like her lower legs are swollen but denies swelling in her thighs.  Denies chest pain shortness of breath.   Past Medical History  Diagnosis Date  . Hypertension   . Diabetes mellitus   . Chronic knee pain     right  . Polio    Past Surgical History  Procedure Laterality Date  . Abdominal hysterectomy     Family History  Problem Relation Age of Onset  . Diabetes Mellitus II Neg Hx   . Hypertension    . High Cholesterol    . Ovarian cancer Mother   . Lupus Neg Hx   . Sarcoidosis Neg Hx    Social History  Substance Use Topics  . Smoking status: Former Smoker -- 0.50 packs/day    Types: Cigarettes    Quit date: 09/09/1968  . Smokeless tobacco: Never Used  . Alcohol Use: No   OB History    No data available     Review of Systems  All other systems reviewed and are negative.     Allergies  Orange concentrate; Peach; Penicillins; Strawberry; and Pineapple  Home Medications   Prior to Admission medications   Medication Sig Start Date End Date Taking? Authorizing Provider  HYDROcodone-acetaminophen (NORCO/VICODIN) 5-325 MG per tablet Take 1-2 tablets by mouth every 6 (six) hours as needed for severe pain. 02/04/15   Antonietta Breach, PA-C  ibuprofen (ADVIL) 200 MG tablet Take 1 tablet (200 mg total) by mouth every 6 (six) hours as needed for fever or moderate pain. 06/08/14   Silver Huguenin Elgergawy, MD   sulfamethoxazole-trimethoprim (BACTRIM DS,SEPTRA DS) 800-160 MG per tablet Take 1 tablet by mouth 2 (two) times daily. 05/17/15 05/24/15  Jola Schmidt, MD   BP 126/83 mmHg  Pulse 74  Temp(Src) 98.7 F (37.1 C) (Oral)  Resp 16  Ht 5\' 7"  (1.702 m)  Wt 160 lb (72.576 kg)  BMI 25.05 kg/m2  SpO2 100% Physical Exam  Constitutional: She is oriented to person, place, and time. She appears well-developed and well-nourished.  HENT:  Head: Normocephalic.  Eyes: EOM are normal.  Neck: Normal range of motion.  Pulmonary/Chest: Effort normal.  Abdominal: She exhibits no distension.  Musculoskeletal: Normal range of motion.  PT and DP pulses present bilaterally.  There is chronic appearing swelling of her lower extremities with some warmth and erythema bilaterally.  No focal fluctuance.  No drainage.  Full range of motion bilateral ankles.  No swelling of bilateral thighs  Neurological: She is alert and oriented to person, place, and time.  Psychiatric: She has a normal mood and affect.  Nursing note and vitals reviewed.   ED Course  Procedures (including critical care time) Labs Review Labs Reviewed  CBC WITH DIFFERENTIAL/PLATELET - Abnormal; Notable for the following:    Hemoglobin 9.9 (*)    HCT 32.3 (*)  MCV 74.3 (*)    MCH 22.8 (*)    All other components within normal limits  COMPREHENSIVE METABOLIC PANEL - Abnormal; Notable for the following:    Albumin 3.3 (*)    ALT 9 (*)    All other components within normal limits    Imaging Review No results found. I have personally reviewed and evaluated these images and lab results as part of my medical decision-making.   EKG Interpretation None      MDM   Final diagnoses:  Venous insufficiency  Cellulitis of lower extremity, unspecified laterality    Venous insufficiency with likely warmth and erythema from swelling however early cellulitis is difficult to exclude.  Patient be started on antibiotics.  No focal abscess at  this time.  Vital signs are normal.  No signs of severe infection at this time.  Infection warnings given patient understands to return to the ER for new or worsening symptoms.  Patient be started on Bactrim.  Anti-inflammatories for pain control.  Strict return precautions    Jola Schmidt, MD 05/17/15 1733

## 2015-05-18 NOTE — Patient Outreach (Signed)
[facility]) Care Management  05/18/2015  [name] 07-12-1946 [id number]   Referral from [facility] Emergency [facility], assigned Jerold Coombe, RN (for Erenest Rasher, RN).  Thanks, Ronnell Freshwater. [place], [place] Assistant Phone: [phone] Fax: [phone]

## 2015-05-19 ENCOUNTER — Other Ambulatory Visit: Payer: Self-pay | Admitting: *Deleted

## 2015-05-19 NOTE — Patient Outreach (Signed)
Referral received from care manager assistant from [facility] Emergency Department to reach out to member to initial program with [facility].  Member has had 2 ED visits in the past 6 months for venous insufficiency.  Call placed to member, no answer.  Mailbox is full, unable to leave message.    Will notify community care manager, Erenest Rasher, upon her return of referral and initial attempt to contact member.  Valente David, BSN, [facility] Management  [facility] Care Manager [phone]

## 2015-05-24 ENCOUNTER — Other Ambulatory Visit (INDEPENDENT_AMBULATORY_CARE_PROVIDER_SITE_OTHER): Payer: Medicare Other

## 2015-05-24 ENCOUNTER — Ambulatory Visit (INDEPENDENT_AMBULATORY_CARE_PROVIDER_SITE_OTHER): Payer: Medicare Other | Admitting: Family

## 2015-05-24 ENCOUNTER — Encounter: Payer: Self-pay | Admitting: Family

## 2015-05-24 VITALS — BP 150/80 | HR 86 | Temp 97.9°F | Resp 18 | Ht 67.0 in | Wt 172.0 lb

## 2015-05-24 DIAGNOSIS — R600 Localized edema: Secondary | ICD-10-CM | POA: Insufficient documentation

## 2015-05-24 DIAGNOSIS — R718 Other abnormality of red blood cells: Secondary | ICD-10-CM | POA: Diagnosis not present

## 2015-05-24 DIAGNOSIS — D509 Iron deficiency anemia, unspecified: Secondary | ICD-10-CM

## 2015-05-24 DIAGNOSIS — E119 Type 2 diabetes mellitus without complications: Secondary | ICD-10-CM

## 2015-05-24 DIAGNOSIS — R630 Anorexia: Secondary | ICD-10-CM

## 2015-05-24 LAB — IBC PANEL
Iron: 51 ug/dL (ref 42–145)
Saturation Ratios: 22.3 % (ref 20.0–50.0)
Transferrin: 163 mg/dL — ABNORMAL LOW (ref 212.0–360.0)

## 2015-05-24 LAB — FERRITIN: FERRITIN: 150.6 ng/mL (ref 10.0–291.0)

## 2015-05-24 LAB — HEMOGLOBIN A1C: Hgb A1c MFr Bld: 4.9 % (ref 4.6–6.5)

## 2015-05-24 MED ORDER — SULFAMETHOXAZOLE-TRIMETHOPRIM 800-160 MG PO TABS: 1.0000 | ORAL_TABLET | Freq: Two times a day (BID) | ORAL | Status: DC

## 2015-05-24 NOTE — Patient Instructions (Addendum)
Thank you for choosing [facility].  Summary/Instructions:  Follow up with [facility] for your left foot and left knee  Your prescription(s) have been submitted to your pharmacy or been printed and provided for you. Please take as directed and contact our office if you believe you are having problem(s) with the medication(s) or have any questions.  Please stop by the lab on the basement level of the building for your blood work. Your results will be released to [place] (or called to you) after review, usually within 72 hours after test completion. If any changes need to be made, you will be notified at that same time.  If your symptoms worsen or fail to improve, please contact our office for further instruction, or in case of emergency go directly to the emergency room at the closest medical facility.   Edema Edema is an abnormal buildup of fluids in your bodytissues. Edema is somewhatdependent on gravity to pull the fluid to the lowest place in your body. That makes the condition more common in the legs and thighs (lower extremities). Painless swelling of the feet and ankles is common and becomes more likely as you get older. It is also common in looser tissues, like around your eyes.  When the affected area is squeezed, the fluid may move out of that spot and leave a dent for a few moments. This dent is called pitting.  CAUSES  There are many possible causes of edema. Eating too much salt and being on your feet or sitting for a long time can cause edema in your legs and ankles. Hot weather may make edema worse. Common medical causes of edema include:  Heart failure.  Liver disease.  Kidney disease.  Weak blood vessels in your legs.  Cancer.  An injury.  Pregnancy.  Some medications.  Obesity. SYMPTOMS  Edema is usually painless.Your skin may look swollen or shiny.  DIAGNOSIS  Your health care provider may be able to diagnose edema by asking about your  medical history and doing a physical exam. You may need to have tests such as X-rays, an electrocardiogram, or blood tests to check for medical conditions that may cause edema.  TREATMENT  Edema treatment depends on the cause. If you have heart, liver, or kidney disease, you need the treatment appropriate for these conditions. General treatment may include:  Elevation of the affected body part above the level of your heart.  Compression of the affected body part. Pressure from elastic bandages or support stockings squeezes the tissues and forces fluid back into the blood vessels. This keeps fluid from entering the tissues.  Restriction of fluid and salt intake.  Use of a water pill (diuretic). These medications are appropriate only for some types of edema. They pull fluid out of your body and make you urinate more often. This gets rid of fluid and reduces swelling, but diuretics can have side effects. Only use diuretics as directed by your health care provider. HOME CARE INSTRUCTIONS   Keep the affected body part above the level of your heart when you are lying down.   Do not sit still or stand for prolonged periods.   Do not put anything directly under your knees when lying down.  Do not wear constricting clothing or garters on your upper legs.   Exercise your legs to work the fluid back into your blood vessels. This may help the swelling go down.   Wear elastic bandages or support stockings to reduce ankle swelling as  directed by your health care provider.   Eat a low-salt diet to reduce fluid if your health care provider recommends it.   Only take medicines as directed by your health care provider. SEEK MEDICAL CARE IF:   Your edema is not responding to treatment.  You have heart, liver, or kidney disease and notice symptoms of edema.  You have edema in your legs that does not improve after elevating them.   You have sudden and unexplained weight gain. SEEK IMMEDIATE  MEDICAL CARE IF:   You develop shortness of breath or chest pain.   You cannot breathe when you lie down.  You develop pain, redness, or warmth in the swollen areas.   You have heart, liver, or kidney disease and suddenly get edema.  You have a fever and your symptoms suddenly get worse. MAKE SURE YOU:   Understand these instructions.  Will watch your condition.  Will get help right away if you are not doing well or get worse. Document Released: 08/26/2005 Document Revised: 01/10/2014 Document Reviewed: 06/18/2013 [facility] Patient Information 2015 [place], [place]. This information is not intended to replace advice given to you by your health care provider. Make sure you discuss any questions you have with your health care provider.

## 2015-05-24 NOTE — Assessment & Plan Note (Signed)
Microcytic anemia noted on previous blood work. Obtain IBC panel and ferritin to check current iron status. Follow up pending results.

## 2015-05-24 NOTE — Assessment & Plan Note (Signed)
Previously noted to be well controlled with lifestyle management. Obtain A1c to determine current status. Continue lifestyle management pending A1c results.

## 2015-05-24 NOTE — Assessment & Plan Note (Signed)
No significant weight loss noted. Discussed eating frequent small meals. Continue to monitor for additional weight loss.

## 2015-05-24 NOTE — Progress Notes (Signed)
Pre visit review using our clinic review tool, if applicable. No additional management support is needed unless otherwise documented below in the visit note. 

## 2015-05-24 NOTE — Assessment & Plan Note (Signed)
Bilateral lower extremity edema from venous insufficiency. There is increased temperature noted specifically in the left leg which may be related to cellultis although continues to struggle with knee pain and and potential break in her foot, both which are being followed by orthopedics. There is concern for a potential blood clot. Obtain ABI to check circulation and venous doppler to rule out DVT. Start Bactrim for cellulitis. Refer to wound care for venous insufficiency and possible compression wraps. Follow up pending testing results and treat conservatively with elevation and compression as tolerated. Follow up with orthopedics for knee and foot.

## 2015-05-24 NOTE — Progress Notes (Signed)
Subjective:    Patient ID: [name], female    DOB: [birth year], 69 y.o.   MRN: [id number]  Chief Complaint  Patient presents with  . Establish Care    having issues with both legs being swollen and heavy, her left foot bothers her and was told she had a crack in her foot a while back, she also states she does not know if she is diabetic, she has never been told that but it states it in ED notes    HPI:  [name] is a 69 y.o. female with a PMH of arthritis, diabetes, chronic knee pain, and hypertension  who presents today for an office visit to establish care.  1.) Cellulitis - Recently seen in the ED approximately 1 week ago for bilateral foot pain and lower extremity edema with no drainage, fevers or shortness of breath. Pulses were intact. No imaging was performed. She was diagnosed with venous insufficiency and potentially early cellulitis and was started on Bactrim and Norco. She has not taken the Bactrim secondary to a mix up.  Improving slowly, but notes there is still room for improvements. Continues to experience lower extremity edema located in her bilateral lower extremities with the left being worse than the right. Modifying factors include leg elevation which does help slightly. Describes there is pain in her left leg that is described as sharp on occasion and rated a 6/10. Severity of her symptoms does effect her mobility but is able to walk and perform most activities of daily living.   2.) Decreased appetite - Associated symptom of decrease appeitite has been improving over the past couple of weeks. Eating about 1-2 meals per day although mostly 1. Notes that she is very picky. Denies abdominal pain, constipation, diarrhea, or trouble swallowing.   3.) Diabetes - Previously diagnosed with diabetes and her last A1c was 5.9 and is not currently maintained with lifestyle. Denies any diabetic symptoms.   4.) Microcytic anemia - Previous blood work show microcytic  anemia. Denies any current symptoms.    Wt Readings from Last 3 Encounters:  05/24/15 172 lb (78.019 kg)  05/17/15 160 lb (72.576 kg)  02/04/15 164 lb (74.39 kg)     Allergies  Allergen Reactions  . Orange Concentrate [Flavoring Agent] Shortness Of Breath, Itching and Swelling  . Peach [Prunus Persica] Shortness Of Breath, Itching and Swelling  . Penicillins Anaphylaxis and Hives    All 'cillins'  . Strawberry Shortness Of Breath, Itching and Swelling  . Aspirin Itching  . Pineapple Itching and Rash     Outpatient Prescriptions Prior to Visit  Medication Sig Dispense Refill  . HYDROcodone-acetaminophen (NORCO/VICODIN) 5-325 MG per tablet Take 1-2 tablets by mouth every 6 (six) hours as needed for severe pain. 20 tablet 0  . ibuprofen (ADVIL) 200 MG tablet Take 1 tablet (200 mg total) by mouth every 6 (six) hours as needed for fever or moderate pain. 30 tablet 0  . sulfamethoxazole-trimethoprim (BACTRIM DS,SEPTRA DS) 800-160 MG per tablet Take 1 tablet by mouth 2 (two) times daily. 14 tablet 0   No facility-administered medications prior to visit.     Past Medical History  Diagnosis Date  . Hypertension   . Diabetes mellitus   . Chronic knee pain     right  . Polio   . Arthritis      Past Surgical History  Procedure Laterality Date  . Abdominal hysterectomy       Family History  Problem Relation  Age of Onset  . Diabetes Mellitus II Neg Hx   . Lupus Neg Hx   . Sarcoidosis Neg Hx   . Hypertension    . High Cholesterol    . Ovarian cancer Mother   . COPD Father      Social History   Social History  . Marital Status: Widowed    Spouse Name: N/A  . Number of Children: 2  . Years of Education: 12   Occupational History  . Not on file.   Social History Main Topics  . Smoking status: Former Smoker -- 0.50 packs/day    Types: Cigarettes    Quit date: 09/09/1968  . Smokeless tobacco: Never Used  . Alcohol Use: No  . Drug Use: No  . Sexual Activity:  No   Other Topics Concern  . Not on file   Social History Narrative   Has family who helps her get around.     Fun: Read   Denies abuse and feel safe at home.    Review of Systems  Constitutional: Positive for appetite change. Negative for fever, chills and diaphoresis.  Respiratory: Negative for cough, chest tightness, shortness of breath and wheezing.   Cardiovascular: Positive for leg swelling. Negative for chest pain and palpitations.      Objective:    BP 150/80 mmHg  Pulse 86  Temp(Src) 97.9 F (36.6 C) (Oral)  Resp 18  Ht 5\' 7"  (1.702 m)  Wt 172 lb (78.019 kg)  BMI 26.93 kg/m2  SpO2 98% Nursing note and vital signs reviewed.  Physical Exam  Constitutional: She is oriented to person, place, and time. She appears well-developed and well-nourished. No distress.  Older female seated in a wheelchair, appears her stated age and is dressed appropriately for the situation. There is obvious edema of her legs bilaterally.   Cardiovascular: Normal rate, regular rhythm, normal heart sounds and intact distal pulses.   Pulmonary/Chest: Effort normal and breath sounds normal.  Musculoskeletal:  Left leg with obvious edema and mild discoloration without deformity. Skin is tender to the touch and feels like an orange peel with some warmth. There is mild calf tenderness. There is no discharge. Additional tenderness is noted on the dorsal aspect of her foot. Distal pulses are intact and appropriate.  Right Leg with obvious edema although less when compared to the left. Mild tenderness of her skin and lower extremity present. Distal pulses are intact and appropriate.   Neurological: She is alert and oriented to person, place, and time.  Skin: Skin is warm and dry.  Psychiatric: She has a normal mood and affect. Her behavior is normal. Judgment and thought content normal.       Assessment & Plan:   Problem List Items Addressed This Visit      Endocrine   Type 2 diabetes mellitus,  controlled    Previously noted to be well controlled with lifestyle management. Obtain A1c to determine current status. Continue lifestyle management pending A1c results.       Relevant Orders   Hemoglobin A1c (Completed)     Other   Microcytic anemia    Microcytic anemia noted on previous blood work. Obtain IBC panel and ferritin to check current iron status. Follow up pending results.       Relevant Orders   IBC panel (Completed)   Ferritin (Completed)   Lower extremity edema - Primary    Bilateral lower extremity edema from venous insufficiency. There is increased temperature noted specifically in the left  leg which may be related to cellultis although continues to struggle with knee pain and and potential break in her foot, both which are being followed by orthopedics. There is concern for a potential blood clot. Obtain ABI to check circulation and venous doppler to rule out DVT. Start Bactrim for cellulitis. Refer to wound care for venous insufficiency and possible compression wraps. Follow up pending testing results and treat conservatively with elevation and compression as tolerated. Follow up with orthopedics for knee and foot.       Relevant Orders   ABI   Ambulatory referral to Wound Clinic   VAS [place] LOWER EXTREMITY VENOUS (DVT)   Decreased appetite    No significant weight loss noted. Discussed eating frequent small meals. Continue to monitor for additional weight loss.

## 2015-05-25 ENCOUNTER — Telehealth: Payer: Self-pay | Admitting: Family

## 2015-05-25 DIAGNOSIS — R718 Other abnormality of red blood cells: Secondary | ICD-10-CM

## 2015-05-25 NOTE — Telephone Encounter (Signed)
Please inform patient that her iron levels look okay and may require additional blood work to determine the reasoning for her anemia. Lab work has been placed for her to complete at her convenience and no fasting or appointment is needed. Also her A1c is 4.9 indicating excellent control of her diabetes. Follow up pending blood work.

## 2015-05-26 ENCOUNTER — Other Ambulatory Visit: Payer: Self-pay

## 2015-05-26 NOTE — Patient Outreach (Signed)
Unsuccessful attempt made to contact patient via telephone at # [phone] to assess needs for community care coordination. Unable to leave HIPPA compliant message as outgoing message indicated inability due to subscribers request  Will make another attempt on Wednesday, September 21.

## 2015-05-29 NOTE — Telephone Encounter (Signed)
Pt aware of results and knows to come in for additional blood work. She states she is going to try to come in on 9/22 when she goes to get her vein and vascular appt.

## 2015-05-31 ENCOUNTER — Other Ambulatory Visit: Payer: Self-pay

## 2015-06-01 ENCOUNTER — Other Ambulatory Visit: Payer: Medicare Other

## 2015-06-01 ENCOUNTER — Inpatient Hospital Stay (HOSPITAL_COMMUNITY): Admission: RE | Admit: 2015-06-01 | Payer: Medicare Other | Source: Ambulatory Visit

## 2015-06-01 ENCOUNTER — Ambulatory Visit (HOSPITAL_COMMUNITY)
Admission: RE | Admit: 2015-06-01 | Discharge: 2015-06-01 | Disposition: A | Discharge status: Home / Self Care | Payer: Medicare Other | Source: Ambulatory Visit | Attending: Vascular Surgery | Admitting: Vascular Surgery

## 2015-06-01 ENCOUNTER — Telehealth: Payer: Self-pay | Admitting: Family

## 2015-06-01 ENCOUNTER — Other Ambulatory Visit: Payer: Self-pay | Admitting: Family

## 2015-06-01 DIAGNOSIS — M79662 Pain in left lower leg: Secondary | ICD-10-CM | POA: Diagnosis present

## 2015-06-01 DIAGNOSIS — R718 Other abnormality of red blood cells: Secondary | ICD-10-CM

## 2015-06-01 DIAGNOSIS — R600 Localized edema: Secondary | ICD-10-CM

## 2015-06-01 DIAGNOSIS — M7989 Other specified soft tissue disorders: Secondary | ICD-10-CM | POA: Insufficient documentation

## 2015-06-01 NOTE — Telephone Encounter (Signed)
Patient was scheduled for a venous doplar and she went to the office on [facility]. Can you please send an order right away to that office so they can perform it there.

## 2015-06-01 NOTE — Telephone Encounter (Signed)
Order has been sent

## 2015-06-05 ENCOUNTER — Telehealth: Payer: Self-pay | Admitting: Family

## 2015-06-05 DIAGNOSIS — R718 Other abnormality of red blood cells: Secondary | ICD-10-CM

## 2015-06-05 LAB — HEMOGLOBINOPATHY EVALUATION
HGB A2 QUANT: 3.4 % — AB (ref 2.2–3.2)
HGB A: 69.2 % — AB (ref 96.8–97.8)
HGB S QUANTITAION: 27.4 % — AB
Hemoglobin Other: 0 %
Hgb F Quant: 0 % (ref 0.0–2.0)

## 2015-06-05 NOTE — Telephone Encounter (Signed)
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 There is generally no treatment needed for this condition, however I am sending a referral to hematology for confirmation and further evaluation.

## 2015-06-05 NOTE — Telephone Encounter (Signed)
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 There is no treatment for this, however I am sending her to hematology for further confirmation.

## 2015-06-06 ENCOUNTER — Telehealth: Payer: Self-pay | Admitting: Hematology

## 2015-06-06 NOTE — Telephone Encounter (Signed)
New patient appt-s/w patient and gave np appt for 10/17 @ 10:30 w/Dr. Irene Limbo.  Referring Dr. Mauricio Po Dx-Microcytosis

## 2015-06-06 NOTE — Telephone Encounter (Signed)
Tried calling pt and mail box was full. Will try back later.

## 2015-06-06 NOTE — Telephone Encounter (Signed)
Pt aware of results and referral

## 2015-06-20 ENCOUNTER — Encounter (HOSPITAL_BASED_OUTPATIENT_CLINIC_OR_DEPARTMENT_OTHER): Payer: Medicare Other | Attending: General Surgery

## 2015-06-22 ENCOUNTER — Other Ambulatory Visit: Payer: Self-pay | Admitting: Family

## 2015-06-23 NOTE — Telephone Encounter (Signed)
Pt does not have this medication listed on her list.

## 2015-06-26 ENCOUNTER — Ambulatory Visit: Payer: Medicare Other | Admitting: Hematology

## 2015-06-26 ENCOUNTER — Other Ambulatory Visit: Payer: Medicare Other

## 2015-07-13 ENCOUNTER — Ambulatory Visit: Payer: Medicare Other | Admitting: Hematology

## 2015-07-13 ENCOUNTER — Other Ambulatory Visit: Payer: Medicare Other

## 2015-07-14 ENCOUNTER — Telehealth: Payer: Self-pay | Admitting: Hematology

## 2015-07-14 NOTE — Telephone Encounter (Signed)
Received message from Marshall Cork re a message left on her voicemail by patient re her appointment. New patient - EPIC message sent to Tiffany to follow up with patient.

## 2015-07-14 NOTE — Telephone Encounter (Signed)
called patietn and r/s appt for 11/23 @ 10:30 w/Dr. Irene Limbo

## 2015-08-02 ENCOUNTER — Ambulatory Visit: Payer: Medicare Other | Admitting: Hematology

## 2015-08-02 ENCOUNTER — Other Ambulatory Visit: Payer: Medicare Other

## 2015-08-14 ENCOUNTER — Other Ambulatory Visit: Payer: Self-pay | Admitting: Family

## 2015-10-13 ENCOUNTER — Ambulatory Visit: Payer: Medicare Other | Admitting: Family

## 2015-10-19 ENCOUNTER — Ambulatory Visit (INDEPENDENT_AMBULATORY_CARE_PROVIDER_SITE_OTHER): Payer: Medicare Other | Admitting: Family

## 2015-10-19 ENCOUNTER — Other Ambulatory Visit (INDEPENDENT_AMBULATORY_CARE_PROVIDER_SITE_OTHER): Payer: Medicare Other

## 2015-10-19 ENCOUNTER — Encounter: Payer: Self-pay | Admitting: Family

## 2015-10-19 VITALS — BP 150/98 | HR 75 | Temp 97.4°F | Resp 14 | Ht 67.0 in

## 2015-10-19 DIAGNOSIS — M25461 Effusion, right knee: Secondary | ICD-10-CM

## 2015-10-19 DIAGNOSIS — I872 Venous insufficiency (chronic) (peripheral): Secondary | ICD-10-CM | POA: Diagnosis not present

## 2015-10-19 DIAGNOSIS — R600 Localized edema: Secondary | ICD-10-CM

## 2015-10-19 DIAGNOSIS — D509 Iron deficiency anemia, unspecified: Secondary | ICD-10-CM | POA: Diagnosis not present

## 2015-10-19 LAB — CBC
HCT: 34.4 % — ABNORMAL LOW (ref 36.0–46.0)
HEMOGLOBIN: 10.6 g/dL — AB (ref 12.0–15.0)
MCHC: 30.7 g/dL (ref 30.0–36.0)
MCV: 73.4 fl — AB (ref 78.0–100.0)
PLATELETS: 206 10*3/uL (ref 150.0–400.0)
RBC: 4.69 Mil/uL (ref 3.87–5.11)
RDW: 15 % (ref 11.5–15.5)
WBC: 4.2 10*3/uL (ref 4.0–10.5)

## 2015-10-19 LAB — IBC PANEL
Iron: 41 ug/dL — ABNORMAL LOW (ref 42–145)
Saturation Ratios: 16.3 % — ABNORMAL LOW (ref 20.0–50.0)
Transferrin: 180 mg/dL — ABNORMAL LOW (ref 212.0–360.0)

## 2015-10-19 LAB — FERRITIN: Ferritin: 110.5 ng/mL (ref 10.0–291.0)

## 2015-10-19 MED ORDER — TRAMADOL HCL 50 MG PO TABS: 50.0000 mg | ORAL_TABLET | Freq: Three times a day (TID) | ORAL | Status: DC | PRN

## 2015-10-19 MED ORDER — DOXYCYCLINE HYCLATE 100 MG PO TABS: 100.0000 mg | ORAL_TABLET | Freq: Two times a day (BID) | ORAL | Status: DC

## 2015-10-19 NOTE — Assessment & Plan Note (Signed)
Obtain CBC and IBC panel and Ferritin to check current status. Not currently taking any supplementation.  Follow up pending blood work results.

## 2015-10-19 NOTE — Assessment & Plan Note (Signed)
Right knee swelling with concern for infection given exam with increased temperature and no history of trauma. Start doxycycline to cover. Keep leg elevated. Refer to orthopedics for potential drainage and infection. Follow up if systemic fever is noted or if symptoms worsen prior to ortho appointment.

## 2015-10-19 NOTE — Progress Notes (Signed)
Subjective:    Patient ID: [name], female    DOB: 1945/10/30, 70 y.o.   MRN: [id number]  Chief Complaint  Patient presents with  . Leg irritation    both of the bottoms of her legs are peeling and swollen, and right knee is very swollen    HPI:  [name] is a 70 y.o. female who  has a past medical history of Hypertension; Diabetes mellitus; Chronic knee pain; Polio; and Arthritis. and presents today for an acute office visit.   Previously seen in the office for bilateral lower extremity swelling with venous insufficency. Associated symptoms of increased swelling located in her bilateral lower extremities and right knee has has been going on for the past 5 weeks with progressive worsening. Describes her legs as swollen and peeling with increased swelling in her right knee.No trauma that she can recall.  She was originally scheduled for a LE doppler and ABI however neither was completed. She did improve somewhat from the Bactrim that she was prescribed. Pain is described as sharp on occasion. Mobile around the house but is overall limited.   Allergies  Allergen Reactions  . Orange Concentrate [Flavoring Agent] Shortness Of Breath, Itching and Swelling  . Peach [Prunus Persica] Shortness Of Breath, Itching and Swelling  . Penicillins Anaphylaxis and Hives    All 'cillins'  . Strawberry Extract Shortness Of Breath, Itching and Swelling  . Aspirin Itching  . Pineapple Itching and Rash     Current Outpatient Prescriptions on File Prior to Visit  Medication Sig Dispense Refill  . ibuprofen (ADVIL) 200 MG tablet Take 1 tablet (200 mg total) by mouth every 6 (six) hours as needed for fever or moderate pain. 30 tablet 0   No current facility-administered medications on file prior to visit.     Past Surgical History  Procedure Laterality Date  . Abdominal hysterectomy      Review of Systems  Respiratory: Negative for chest tightness and shortness of breath.     Cardiovascular: Positive for leg swelling. Negative for chest pain and palpitations.  Musculoskeletal: Positive for joint swelling.       Positive for right knee pain.      Objective:    BP 150/98 mmHg  Pulse 75  Temp(Src) 97.4 F (36.3 C) (Oral)  Resp 14  Ht 5\' 7"  (1.702 m)  Wt   SpO2 99% Nursing note and vital signs reviewed.  Physical Exam  Constitutional: She is oriented to person, place, and time. She appears well-developed and well-nourished. No distress.  Cardiovascular: Normal rate, regular rhythm, normal heart sounds and intact distal pulses.   Moderate amount of peripheral edema noted bilateral with slight peau d' orange appearance of the proximal 1/3 of the lower leg. There is scaling of the skin with no evidence of discharge or warmth.   Pulmonary/Chest: Effort normal and breath sounds normal.  Musculoskeletal:  Right knee- Large effusion with increased temperature of right knee noted. Generalized knee tenderness is present. Range of motion is restricted secondary to edema. Unable to complete ligamentous testing secondary to edema. Distal pulses are intact.   Neurological: She is alert and oriented to person, place, and time.  Skin: Skin is warm and dry.  Psychiatric: She has a normal mood and affect. Her behavior is normal. Judgment and thought content normal.       Assessment & Plan:   Problem List Items Addressed This Visit      Cardiovascular and Mediastinum  Venous insufficiency   Relevant Medications   doxycycline (VIBRA-TABS) 100 MG tablet   traMADol (ULTRAM) 50 MG tablet   Other Relevant Orders   ABI     Other   Microcytic anemia    Obtain CBC and IBC panel and Ferritin to check current status. Not currently taking any supplementation.  Follow up pending blood work results.       Relevant Orders   CBC (Completed)   IBC panel (Completed)   Ferritin   Lower extremity edema    Bilateral edema without evidence of infection or cellulitis with  concern for lymph edema or venous insuffiencey. Previously ordered LE dopplers and ABI never completed. Recommend conservative treatment with elevation and compression pending blood flow studies. May require referral for lymphedema wraps. Follow up if symptoms worsen pending studies.       Swelling of joint, knee, right - Primary    Right knee swelling with concern for infection given exam with increased temperature and no history of trauma. Start doxycycline to cover. Keep leg elevated. Refer to orthopedics for potential drainage and infection. Follow up if systemic fever is noted or if symptoms worsen prior to ortho appointment.       Relevant Medications   doxycycline (VIBRA-TABS) 100 MG tablet   traMADol (ULTRAM) 50 MG tablet   Other Relevant Orders   AMB referral to orthopedics

## 2015-10-19 NOTE — Progress Notes (Signed)
Pre visit review using our clinic review tool, if applicable. No additional management support is needed unless otherwise documented below in the visit note. 

## 2015-10-19 NOTE — Assessment & Plan Note (Signed)
Bilateral edema without evidence of infection or cellulitis with concern for lymph edema or venous insuffiencey. Previously ordered LE dopplers and ABI never completed. Recommend conservative treatment with elevation and compression pending blood flow studies. May require referral for lymphedema wraps. Follow up if symptoms worsen pending studies.

## 2015-10-19 NOTE — Patient Instructions (Signed)
Thank you for choosing [facility].  Summary/Instructions:  Your prescription(s) have been submitted to your pharmacy or been printed and provided for you. Please take as directed and contact our office if you believe you are having problem(s) with the medication(s) or have any questions.  Please stop by the lab on the basement level of the building for your blood work. Your results will be released to [place] (or called to you) after review, usually within 72 hours after test completion. If any changes need to be made, you will be notified at that same time.  If your symptoms worsen or fail to improve, please contact our office for further instruction, or in case of emergency go directly to the emergency room at the closest medical facility.   Please take the antibiotic until completed.   Elevate your legs as much as possible.   Ice for discomfort and inflammation.  They will call to set up an orthopedic appointment.

## 2015-10-20 ENCOUNTER — Encounter (HOSPITAL_COMMUNITY): Payer: Medicare Other

## 2015-10-22 ENCOUNTER — Telehealth: Payer: Self-pay | Admitting: Family

## 2015-10-22 NOTE — Telephone Encounter (Signed)
Please inform patient that her blood work shows that her iron levels and hemoglobin are both low indicating a degree of iron deficiency anemia and this goes along with her already potential alpha-thalasemia. She can start taking iron supplementation to try to improve this. It is available over the counter called Ferrous sulfate 325 mg 1 tablet daily. Her allergies to citrus will make absorption a little more challenging. Therefore I would like her to try to follow up with hematology as we previously attempted. If a new referral is needed, I am happy to place it.

## 2015-10-23 ENCOUNTER — Other Ambulatory Visit: Payer: Self-pay | Admitting: Family

## 2015-10-23 DIAGNOSIS — R609 Edema, unspecified: Secondary | ICD-10-CM

## 2015-10-24 NOTE — Telephone Encounter (Signed)
Pts phone was cut off and VM was full. Sending results in the mail.

## 2015-10-26 ENCOUNTER — Ambulatory Visit (HOSPITAL_COMMUNITY): Admission: RE | Admit: 2015-10-26 | Payer: Medicare Other | Source: Ambulatory Visit

## 2015-11-09 ENCOUNTER — Encounter (HOSPITAL_COMMUNITY): Payer: Self-pay | Admitting: Cardiovascular Disease

## 2015-11-21 ENCOUNTER — Telehealth: Payer: Self-pay | Admitting: Family

## 2015-11-21 NOTE — Telephone Encounter (Signed)
Please follow up with patient in regards to a referral.  Patient states she was referred somewhere but she is not sure where or with who.

## 2015-11-28 ENCOUNTER — Inpatient Hospital Stay (HOSPITAL_COMMUNITY): Admission: RE | Admit: 2015-11-28 | Payer: Medicare Other | Source: Ambulatory Visit

## 2015-11-28 NOTE — Telephone Encounter (Signed)
Pt scheduled for [place] on 12/01/15.

## 2015-12-01 ENCOUNTER — Inpatient Hospital Stay (HOSPITAL_COMMUNITY): Admission: RE | Admit: 2015-12-01 | Payer: Medicare Other | Source: Ambulatory Visit

## 2015-12-11 ENCOUNTER — Encounter (HOSPITAL_COMMUNITY): Payer: Self-pay | Admitting: Family

## 2015-12-12 DIAGNOSIS — M25562 Pain in left knee: Secondary | ICD-10-CM | POA: Diagnosis not present

## 2015-12-12 DIAGNOSIS — M25561 Pain in right knee: Secondary | ICD-10-CM | POA: Diagnosis not present

## 2016-01-19 ENCOUNTER — Encounter: Payer: Self-pay | Admitting: Family

## 2016-01-19 ENCOUNTER — Ambulatory Visit (INDEPENDENT_AMBULATORY_CARE_PROVIDER_SITE_OTHER): Payer: Medicare Other | Admitting: Family

## 2016-01-19 VITALS — BP 162/94 | HR 76 | Temp 98.4°F | Resp 12 | Wt 186.1 lb

## 2016-01-19 DIAGNOSIS — R600 Localized edema: Secondary | ICD-10-CM | POA: Diagnosis not present

## 2016-01-19 DIAGNOSIS — I872 Venous insufficiency (chronic) (peripheral): Secondary | ICD-10-CM

## 2016-01-19 DIAGNOSIS — L03113 Cellulitis of right upper limb: Secondary | ICD-10-CM | POA: Insufficient documentation

## 2016-01-19 DIAGNOSIS — L03119 Cellulitis of unspecified part of limb: Secondary | ICD-10-CM

## 2016-01-19 MED ORDER — SULFAMETHOXAZOLE-TRIMETHOPRIM 800-160 MG PO TABS: 1.0000 | ORAL_TABLET | Freq: Two times a day (BID) | ORAL | Status: DC

## 2016-01-19 NOTE — Progress Notes (Signed)
Subjective:    Patient ID: [name], female    DOB: April 28, 1946, 70 y.o.   MRN: [id number]  Chief Complaint  Patient presents with  . Acute Visit    legs swelling and weeping x 2 weeks    HPI:  [name] is a 70 y.o. female who  has a past medical history of Hypertension; Diabetes mellitus; Chronic knee pain; Polio; and Arthritis. and presents today for an office follow up.   Associated symptom of bilateral lower extremity edema has been going on for about 2 weeks. States that her temperature has been slightly elevated. Modifying factors include keeping it clean with soap and water. Describes with itching and burning. Course of the symptoms has been worsening since the initial onset. Occasional numbness and tingling of her bilateral lower legs.   Allergies  Allergen Reactions  . Orange Concentrate [Flavoring Agent] Shortness Of Breath, Itching and Swelling  . Peach [Prunus Persica] Shortness Of Breath, Itching and Swelling  . Penicillins Anaphylaxis and Hives    All 'cillins'  . Strawberry Extract Shortness Of Breath, Itching and Swelling  . Aspirin Itching  . Pineapple Itching and Rash     Current Outpatient Prescriptions on File Prior to Visit  Medication Sig Dispense Refill  . traMADol (ULTRAM) 50 MG tablet Take 1 tablet (50 mg total) by mouth every 8 (eight) hours as needed. 30 tablet 0  . doxycycline (VIBRA-TABS) 100 MG tablet Take 1 tablet (100 mg total) by mouth 2 (two) times daily. (Patient not taking: Reported on 01/19/2016) 20 tablet 0  . ibuprofen (ADVIL) 200 MG tablet Take 1 tablet (200 mg total) by mouth every 6 (six) hours as needed for fever or moderate pain. (Patient not taking: Reported on 01/19/2016) 30 tablet 0   No current facility-administered medications on file prior to visit.     Past Surgical History  Procedure Laterality Date  . Abdominal hysterectomy       Review of Systems  Constitutional: Positive for fever. Negative for chills.   Respiratory: Negative for chest tightness and shortness of breath.   Cardiovascular: Negative for chest pain, palpitations and leg swelling.  Skin: Positive for wound.      Objective:    BP 162/94 mmHg  Pulse 76  Temp(Src) 98.4 F (36.9 C) (Oral)  Resp 12  Wt 186 lb 1.9 oz (84.423 kg)  SpO2 94% Nursing note and vital signs reviewed.  Physical Exam  Constitutional: She is oriented to person, place, and time. She appears well-developed and well-nourished. No distress.  Cardiovascular: Normal rate, regular rhythm, normal heart sounds and intact distal pulses.   Pulmonary/Chest: Effort normal and breath sounds normal.  Neurological: She is alert and oriented to person, place, and time.  Skin: Skin is warm and dry.  Bilateral lower extremity edema with mild redness and serious fluid noted from the right leg. There is tenderness of both legs. Pulses are not distinguishable secondary to lower extremity edema, with capillary refill appearing normal.   Psychiatric: She has a normal mood and affect. Her behavior is normal. Judgment and thought content normal.       Assessment & Plan:   Problem List Items Addressed This Visit      Cardiovascular and Mediastinum   Venous insufficiency    Venous insufficiency versus lymphedema. May benefit from vein and vascular assessment. Follow up pending completion of antibiotics.         Other   Lower extremity edema   Cellulitis -  Primary    Symptoms and exam consistent with cellulitis. Start bactrim. Instructed to keep legs elevated. Follow up if symptoms worsen or fail to improve.       Relevant Medications   sulfamethoxazole-trimethoprim (BACTRIM DS,SEPTRA DS) 800-160 MG tablet       I am having [name] start on sulfamethoxazole-trimethoprim. I am also having her maintain her ibuprofen, doxycycline, traMADol, and Iron.   Meds ordered this encounter  Medications  . Ferrous Sulfate (IRON) 325 (65 Fe) MG TABS    Sig: Take by  mouth.  . sulfamethoxazole-trimethoprim (BACTRIM DS,SEPTRA DS) 800-160 MG tablet    Sig: Take 1 tablet by mouth 2 (two) times daily.    Dispense:  20 tablet    Refill:  0    Order Specific Question:  Supervising Provider    Answer:  Pricilla Holm A [id number]     Follow-up: Return if symptoms worsen or fail to improve.  Mauricio Po, FNP

## 2016-01-19 NOTE — Assessment & Plan Note (Signed)
Symptoms and exam consistent with cellulitis. Start bactrim. Instructed to keep legs elevated. Follow up if symptoms worsen or fail to improve.

## 2016-01-19 NOTE — Assessment & Plan Note (Signed)
Venous insufficiency versus lymphedema. May benefit from vein and vascular assessment. Follow up pending completion of antibiotics.

## 2016-01-19 NOTE — Progress Notes (Signed)
Pre visit review using our clinic review tool, if applicable. No additional management support is needed unless otherwise documented below in the visit note. 

## 2016-01-19 NOTE — Patient Instructions (Addendum)
Thank you for choosing [facility].  Summary/Instructions:  Please keep legs clean with soap and water. May need to apply a dressing with a roller bandage as needed.   Your prescription(s) have been submitted to your pharmacy or been printed and provided for you. Please take as directed and contact our office if you believe you are having problem(s) with the medication(s) or have any questions.  If your symptoms worsen or fail to improve, please contact our office for further instruction, or in case of emergency go directly to the emergency room at the closest medical facility.   Cellulitis Cellulitis is an infection of the skin and the tissue beneath it. The infected area is usually red and tender. Cellulitis occurs most often in the arms and lower legs.  CAUSES  Cellulitis is caused by bacteria that enter the skin through cracks or cuts in the skin. The most common types of bacteria that cause cellulitis are staphylococci and streptococci. SIGNS AND SYMPTOMS   Redness and warmth.  Swelling.  Tenderness or pain.  Fever. DIAGNOSIS  Your health care provider can usually determine what is wrong based on a physical exam. Blood tests may also be done. TREATMENT  Treatment usually involves taking an antibiotic medicine. HOME CARE INSTRUCTIONS   Take your antibiotic medicine as directed by your health care provider. Finish the antibiotic even if you start to feel better.  Keep the infected arm or leg elevated to reduce swelling.  Apply a warm cloth to the affected area up to 4 times per day to relieve pain.  Take medicines only as directed by your health care provider.  Keep all follow-up visits as directed by your health care provider. SEEK MEDICAL CARE IF:   You notice red streaks coming from the infected area.  Your red area gets larger or turns dark in color.  Your bone or joint underneath the infected area becomes painful after the skin has healed.  Your infection  returns in the same area or another area.  You notice a swollen bump in the infected area.  You develop new symptoms.  You have a fever. SEEK IMMEDIATE MEDICAL CARE IF:   You feel very sleepy.  You develop vomiting or diarrhea.  You have a general ill feeling (malaise) with muscle aches and pains.   This information is not intended to replace advice given to you by your health care provider. Make sure you discuss any questions you have with your health care provider.   Document Released: 06/05/2005 Document Revised: 05/17/2015 Document Reviewed: 11/11/2011 Elsevier Interactive Patient Education [employer].

## 2016-01-29 DIAGNOSIS — M1711 Unilateral primary osteoarthritis, right knee: Secondary | ICD-10-CM | POA: Diagnosis not present

## 2016-01-29 DIAGNOSIS — M1712 Unilateral primary osteoarthritis, left knee: Secondary | ICD-10-CM | POA: Diagnosis not present

## 2016-02-04 ENCOUNTER — Observation Stay (HOSPITAL_COMMUNITY): Payer: Medicare Other

## 2016-02-04 ENCOUNTER — Observation Stay (HOSPITAL_COMMUNITY)
Admission: EM | Admit: 2016-02-04 | Discharge: 2016-02-05 | Disposition: A | Discharge status: Skilled Nursing Facility | Payer: Medicare Other | Attending: Internal Medicine | Admitting: Internal Medicine

## 2016-02-04 ENCOUNTER — Encounter (HOSPITAL_COMMUNITY): Payer: Self-pay | Admitting: Emergency Medicine

## 2016-02-04 ENCOUNTER — Emergency Department (HOSPITAL_COMMUNITY): Payer: Medicare Other

## 2016-02-04 DIAGNOSIS — S52572A Other intraarticular fracture of lower end of left radius, initial encounter for closed fracture: Secondary | ICD-10-CM | POA: Diagnosis not present

## 2016-02-04 DIAGNOSIS — I872 Venous insufficiency (chronic) (peripheral): Secondary | ICD-10-CM | POA: Diagnosis not present

## 2016-02-04 DIAGNOSIS — S638X2A Sprain of other part of left wrist and hand, initial encounter: Secondary | ICD-10-CM | POA: Insufficient documentation

## 2016-02-04 DIAGNOSIS — S52592A Other fractures of lower end of left radius, initial encounter for closed fracture: Secondary | ICD-10-CM | POA: Diagnosis not present

## 2016-02-04 DIAGNOSIS — S52502A Unspecified fracture of the lower end of left radius, initial encounter for closed fracture: Principal | ICD-10-CM | POA: Insufficient documentation

## 2016-02-04 DIAGNOSIS — M25561 Pain in right knee: Secondary | ICD-10-CM | POA: Diagnosis not present

## 2016-02-04 DIAGNOSIS — S52509A Unspecified fracture of the lower end of unspecified radius, initial encounter for closed fracture: Secondary | ICD-10-CM

## 2016-02-04 DIAGNOSIS — Z87891 Personal history of nicotine dependence: Secondary | ICD-10-CM | POA: Diagnosis not present

## 2016-02-04 DIAGNOSIS — G8929 Other chronic pain: Secondary | ICD-10-CM | POA: Diagnosis not present

## 2016-02-04 DIAGNOSIS — Z880 Allergy status to penicillin: Secondary | ICD-10-CM | POA: Insufficient documentation

## 2016-02-04 DIAGNOSIS — L03116 Cellulitis of left lower limb: Secondary | ICD-10-CM | POA: Diagnosis not present

## 2016-02-04 DIAGNOSIS — S59292A Other physeal fracture of lower end of radius, left arm, initial encounter for closed fracture: Secondary | ICD-10-CM | POA: Diagnosis not present

## 2016-02-04 DIAGNOSIS — S52612A Displaced fracture of left ulna styloid process, initial encounter for closed fracture: Secondary | ICD-10-CM

## 2016-02-04 DIAGNOSIS — R531 Weakness: Secondary | ICD-10-CM | POA: Diagnosis not present

## 2016-02-04 DIAGNOSIS — E119 Type 2 diabetes mellitus without complications: Secondary | ICD-10-CM | POA: Insufficient documentation

## 2016-02-04 DIAGNOSIS — S5290XA Unspecified fracture of unspecified forearm, initial encounter for closed fracture: Secondary | ICD-10-CM | POA: Insufficient documentation

## 2016-02-04 DIAGNOSIS — S52613A Displaced fracture of unspecified ulna styloid process, initial encounter for closed fracture: Secondary | ICD-10-CM | POA: Diagnosis present

## 2016-02-04 DIAGNOSIS — I10 Essential (primary) hypertension: Secondary | ICD-10-CM | POA: Diagnosis not present

## 2016-02-04 DIAGNOSIS — M7989 Other specified soft tissue disorders: Secondary | ICD-10-CM | POA: Diagnosis not present

## 2016-02-04 DIAGNOSIS — S52002A Unspecified fracture of upper end of left ulna, initial encounter for closed fracture: Secondary | ICD-10-CM | POA: Diagnosis present

## 2016-02-04 DIAGNOSIS — Z993 Dependence on wheelchair: Secondary | ICD-10-CM | POA: Insufficient documentation

## 2016-02-04 DIAGNOSIS — W19XXXA Unspecified fall, initial encounter: Secondary | ICD-10-CM | POA: Insufficient documentation

## 2016-02-04 DIAGNOSIS — S52042A Displaced fracture of coronoid process of left ulna, initial encounter for closed fracture: Secondary | ICD-10-CM | POA: Diagnosis not present

## 2016-02-04 DIAGNOSIS — S52692A Other fracture of lower end of left ulna, initial encounter for closed fracture: Secondary | ICD-10-CM | POA: Diagnosis not present

## 2016-02-04 DIAGNOSIS — R4189 Other symptoms and signs involving cognitive functions and awareness: Secondary | ICD-10-CM | POA: Diagnosis not present

## 2016-02-04 DIAGNOSIS — D649 Anemia, unspecified: Secondary | ICD-10-CM | POA: Diagnosis not present

## 2016-02-04 DIAGNOSIS — R600 Localized edema: Secondary | ICD-10-CM | POA: Diagnosis present

## 2016-02-04 DIAGNOSIS — L03119 Cellulitis of unspecified part of limb: Secondary | ICD-10-CM

## 2016-02-04 HISTORY — DX: Unspecified fracture of the lower end of unspecified radius, initial encounter for closed fracture: S52.509A

## 2016-02-04 LAB — COMPREHENSIVE METABOLIC PANEL
ALBUMIN: 3.4 g/dL — AB (ref 3.5–5.0)
ALT: 12 U/L — ABNORMAL LOW (ref 14–54)
AST: 18 U/L (ref 15–41)
Alkaline Phosphatase: 73 U/L (ref 38–126)
Anion gap: 7 (ref 5–15)
BILIRUBIN TOTAL: 0.7 mg/dL (ref 0.3–1.2)
BUN: 9 mg/dL (ref 6–20)
CALCIUM: 9.2 mg/dL (ref 8.9–10.3)
CHLORIDE: 106 mmol/L (ref 101–111)
CO2: 26 mmol/L (ref 22–32)
CREATININE: 0.73 mg/dL (ref 0.44–1.00)
GFR calc Af Amer: >60 mL/min (ref >60)
GFR calc non Af Amer: >60 mL/min (ref >60)
Glucose, Bld: 85 mg/dL (ref 65–99)
POTASSIUM: 3.7 mmol/L (ref 3.5–5.1)
Sodium: 139 mmol/L (ref 135–145)
Total Protein: 7 g/dL (ref 6.5–8.1)

## 2016-02-04 LAB — CBC WITH DIFFERENTIAL/PLATELET
Basophils Absolute: 0 10*3/uL (ref 0.0–0.1)
Basophils Relative: 0 %
EOS PCT: 3 %
Eosinophils Absolute: 0.1 10*3/uL (ref 0.0–0.7)
HEMATOCRIT: 33.7 % — AB (ref 36.0–46.0)
Hemoglobin: 10 g/dL — ABNORMAL LOW (ref 12.0–15.0)
Lymphocytes Relative: 25 %
Lymphs Abs: 1.2 10*3/uL (ref 0.7–4.0)
MCH: 21.9 pg — ABNORMAL LOW (ref 26.0–34.0)
MCHC: 29.7 g/dL — ABNORMAL LOW (ref 30.0–36.0)
MCV: 73.9 fL — ABNORMAL LOW (ref 78.0–100.0)
MONO ABS: 0.3 10*3/uL (ref 0.1–1.0)
MONOS PCT: 6 %
NEUTROS PCT: 66 %
Neutro Abs: 3 10*3/uL (ref 1.7–7.7)
Platelets: 213 10*3/uL (ref 150–400)
RBC: 4.56 MIL/uL (ref 3.87–5.11)
RDW: 14.6 % (ref 11.5–15.5)
WBC: 4.6 10*3/uL (ref 4.0–10.5)

## 2016-02-04 LAB — I-STAT TROPONIN, ED: TROPONIN I, POC: 0 ng/mL (ref 0.00–0.08)

## 2016-02-04 LAB — BRAIN NATRIURETIC PEPTIDE: B NATRIURETIC PEPTIDE 5: 23.7 pg/mL (ref 0.0–100.0)

## 2016-02-04 MED ORDER — FENTANYL CITRATE (PF) 100 MCG/2ML IJ SOLN: 25.0000 ug | INTRAMUSCULAR | Status: DC | PRN

## 2016-02-04 MED ORDER — ACETAMINOPHEN 325 MG PO TABS: 650.0000 mg | ORAL_TABLET | Freq: Four times a day (QID) | ORAL | Status: DC | PRN

## 2016-02-04 MED ORDER — ONDANSETRON HCL 4 MG/2ML IJ SOLN
4.0000 mg | Freq: Once | INTRAMUSCULAR | Status: AC
  Administered 2016-02-04: 4 mg via INTRAVENOUS
  Filled 2016-02-04: qty 2

## 2016-02-04 MED ORDER — ACETAMINOPHEN 650 MG RE SUPP: 650.0000 mg | Freq: Four times a day (QID) | RECTAL | Status: DC | PRN

## 2016-02-04 MED ORDER — DOCUSATE SODIUM 100 MG PO CAPS
100.0000 mg | ORAL_CAPSULE | Freq: Two times a day (BID) | ORAL | Status: DC
Start: 2016-02-04 — End: 2016-02-05
  Administered 2016-02-04 – 2016-02-05 (×2): 100 mg via ORAL
  Filled 2016-02-04 (×2): qty 1

## 2016-02-04 MED ORDER — ONDANSETRON HCL 4 MG PO TABS: 4.0000 mg | ORAL_TABLET | Freq: Four times a day (QID) | ORAL | Status: DC | PRN

## 2016-02-04 MED ORDER — LIDOCAINE HCL 2 % IJ SOLN
10.0000 mL | Freq: Once | INTRAMUSCULAR | Status: AC
  Administered 2016-02-04: 200 mg
  Filled 2016-02-04: qty 20

## 2016-02-04 MED ORDER — MORPHINE SULFATE (PF) 4 MG/ML IV SOLN
4.0000 mg | Freq: Once | INTRAVENOUS | Status: AC
  Administered 2016-02-04: 4 mg via INTRAVENOUS
  Filled 2016-02-04: qty 1

## 2016-02-04 MED ORDER — FERROUS SULFATE 325 (65 FE) MG PO TABS
325.0000 mg | ORAL_TABLET | Freq: Two times a day (BID) | ORAL | Status: DC
  Administered 2016-02-04 – 2016-02-05 (×3): 325 mg via ORAL
  Filled 2016-02-04 (×3): qty 1

## 2016-02-04 MED ORDER — HYDROCODONE-ACETAMINOPHEN 5-325 MG PO TABS
1.0000 | ORAL_TABLET | Freq: Four times a day (QID) | ORAL | Status: DC | PRN
  Administered 2016-02-04 – 2016-02-05 (×3): 1 via ORAL
  Filled 2016-02-04 (×3): qty 1

## 2016-02-04 MED ORDER — ONDANSETRON HCL 4 MG/2ML IJ SOLN: 4.0000 mg | Freq: Four times a day (QID) | INTRAMUSCULAR | Status: DC | PRN

## 2016-02-04 MED ORDER — ENOXAPARIN SODIUM 40 MG/0.4ML ~~LOC~~ SOLN
40.0000 mg | SUBCUTANEOUS | Status: DC
  Administered 2016-02-04: 40 mg via SUBCUTANEOUS
  Filled 2016-02-04: qty 0.4

## 2016-02-04 MED ORDER — SULFAMETHOXAZOLE-TRIMETHOPRIM 800-160 MG PO TABS
1.0000 | ORAL_TABLET | Freq: Two times a day (BID) | ORAL | Status: DC
  Administered 2016-02-04 – 2016-02-05 (×2): 1 via ORAL
  Filled 2016-02-04 (×2): qty 1

## 2016-02-04 NOTE — ED Notes (Signed)
Ice pack applied to left wrist

## 2016-02-04 NOTE — Progress Notes (Signed)
Orthopedic Tech Progress Note Patient Details:  [name] [birth year] [id number]  Ortho Devices Type of Ortho Device: Ace wrap, Arm sling, Sugartong splint Ortho Device/Splint Location: LUE Ortho Device/Splint Interventions: Ordered, Application   Braulio Bosch 02/04/2016, 5:05 PM

## 2016-02-04 NOTE — H&P (Signed)
[facility] History and Physical   Patient: Janet Janet [id number]   PCP: Mauricio Po, FNP DOB: 06/06/1946   DOA: 02/04/2016   DOS: 02/04/2016   DOS: the patient was seen and examined on 02/04/2016  Patient coming from: The patient is coming from home.  Chief Complaint: Fall as well as leg swelling with ulcer  HPI: Janet Janet is a 70 y.o. female with Past medical history of essential hypertension, chronic anemia, diet-controlled diabetes, recurrent cellulitis. The patient presents to ER with a fall. Patient was coming to ER with her daughter for complaints of bilateral leg swelling with ulcer development and missed her footing and fell on the ground with her daughter falling on top of her with injury to her left hand. Patient denies any complaints of headache or dizziness or head injury or neck pain. Also now complains of chest pain or shortness of breath or cough or palpitation. No vision changes, tingling or numbness. No recent nausea vomiting or diarrhea at home.  Patient has swelling of her legs chronically and was on initially hydrochlorothiazide and later on Lasix with improvement in the swelling. Patient was taken off the medication due to improvement in blood pressure many months ago. Since last 2-3 months the patient has been noticing progressively worsening swelling without any orthopnea or PND. Patient has also noted blisters and weeping from the leg. This particular ulcer has been present since last 2 weeks and initially started as a blister and currently causes burning pain. Patient was not able to complete Doppler ultrasound as an outpatient.  ED Course: Presented with fall with left hand pain, x-ray shows radial ulnar fracture. Hand surgery recommends conservative management and Dr. Fredna Dow reduced the fracture in the ER. Patient was not able to get PT eval in the ER and was recommended to be admitted in the hospital.  At her baseline ambulates with  support and since last few days wheelchair-bound. And is independent for most of her ADL; manages her medication on her own.  Review of Systems: as mentioned in the history of present illness.  A comprehensive review of the other systems is negative.  Past Medical History  Diagnosis Date  . Hypertension   . Diabetes mellitus   . Chronic knee pain     right  . Polio   . Arthritis    Past Surgical History  Procedure Laterality Date  . Abdominal hysterectomy     Social History:  reports that she quit smoking about 47 years ago. Her smoking use included Cigarettes. She smoked 0.50 packs per day. She has never used smokeless tobacco. She reports that she does not drink alcohol or use illicit drugs.  Allergies  Allergen Reactions  . Orange Concentrate [Flavoring Agent] Shortness Of Breath, Itching and Swelling  . Peach [Prunus Persica] Shortness Of Breath, Itching and Swelling  . Penicillins Anaphylaxis and Hives    All 'cillins' Has patient had a PCN reaction causing immediate rash, facial/tongue/throat swelling, SOB or lightheadedness with hypotension: Yes Has patient had a PCN reaction causing severe rash involving mucus membranes or skin necrosis: No Has patient had a PCN reaction that required hospitalization No Has patient had a PCN reaction occurring within the last 10 years: No If all of the above answers are "NO", then may proceed with Cephalosporin use.   . Strawberry Extract Shortness Of Breath, Itching and Swelling  . Aspirin Itching  . Pineapple Itching and Rash  . Strawberry Flavor Itching and Rash  Family History  Problem Relation Age of Onset  . Diabetes Mellitus II Neg Hx   . Lupus Neg Hx   . Sarcoidosis Neg Hx   . Hypertension    . High Cholesterol    . Ovarian cancer Mother   . COPD Father      Prior to Admission medications   Medication Sig Start Date End Date Taking? Authorizing Provider  sulfamethoxazole-trimethoprim (BACTRIM DS,SEPTRA DS)  800-160 MG tablet Take 1 tablet by mouth 2 (two) times daily. 01/19/16  Yes Golden Circle, FNP  Ferrous Sulfate (IRON) 325 (65 Fe) MG TABS Take by mouth.    Historical Provider, MD    Physical Exam: Filed Vitals:   02/04/16 1302 02/04/16 1545  BP: 132/73 139/70  Pulse: 80 80  Temp: 98.5 F (36.9 C)   TempSrc: Oral   Resp: 20 20  SpO2: 99% 99%    General: Alert, Awake and Oriented to Time, Place and Person. Appear in mild distress Eyes: PERRL, Conjunctiva normal ENT: Oral Mucosa clear moist. Neck: no JVD, no Abnormal Mass Or lumps Cardiovascular: S1 and S2 Present, no Murmur, Peripheral Pulses Present Respiratory: Bilateral Air entry equal and Decreased, Clear to Auscultation, no Crackles, no wheezes Abdomen: Bowel Sound present, Soft and no tenderness Skin: no redness, discoloration of bilateral lower extremity consistent with venous insufficiency, irregular ulcer likely ruptured blister without any evidence of infection Extremities: bilateral  Pedal edema, no calf tenderness Neurologic: Grossly no focal neuro deficit. Bilaterally Equal motor strength  Labs on Admission:  CBC:  Recent Labs Lab 02/04/16 1409  WBC 4.6  NEUTROABS 3.0  HGB 10.0*  HCT 33.7*  MCV 73.9*  PLT 123456   Basic Metabolic Panel:  Recent Labs Lab 02/04/16 1409  NA 139  K 3.7  CL 106  CO2 26  GLUCOSE 85  BUN 9  CREATININE 0.73  CALCIUM 9.2   GFR: CrCl cannot be calculated (Unknown ideal weight.). Liver Function Tests:  Recent Labs Lab 02/04/16 1409  AST 18  ALT 12*  ALKPHOS 73  BILITOT 0.7  PROT 7.0  ALBUMIN 3.4*   No results for input(s): LIPASE, AMYLASE in the last 168 hours. No results for input(s): AMMONIA in the last 168 hours. Coagulation Profile: No results for input(s): INR, PROTIME in the last 168 hours. Cardiac Enzymes: No results for input(s): CKTOTAL, CKMB, CKMBINDEX, TROPONINI in the last 168 hours. BNP (last 3 results) No results for input(s): PROBNP in the  last 8760 hours. HbA1C: No results for input(s): HGBA1C in the last 72 hours. CBG: No results for input(s): GLUCAP in the last 168 hours. Lipid Profile: No results for input(s): CHOL, HDL, LDLCALC, TRIG, CHOLHDL, LDLDIRECT in the last 72 hours. Thyroid Function Tests: No results for input(s): TSH, T4TOTAL, FREET4, T3FREE, THYROIDAB in the last 72 hours. Anemia Panel: No results for input(s): VITAMINB12, FOLATE, FERRITIN, TIBC, IRON, RETICCTPCT in the last 72 hours. Urine analysis:    Component Value Date/Time   COLORURINE YELLOW 06/06/2014 1311   APPEARANCEUR CLOUDY* 06/06/2014 1311   LABSPEC 1.018 06/06/2014 1311   PHURINE 6.0 06/06/2014 1311   GLUCOSEU NEGATIVE 06/06/2014 1311   HGBUR SMALL* 06/06/2014 1311   BILIRUBINUR SMALL* 06/06/2014 1311   KETONESUR >80* 06/06/2014 1311   PROTEINUR NEGATIVE 06/06/2014 1311   UROBILINOGEN 1.0 06/06/2014 1311   NITRITE NEGATIVE 06/06/2014 1311   LEUKOCYTESUR MODERATE* 06/06/2014 1311    Radiological Exams on Admission: Dg Chest 2 View  02/04/2016  CLINICAL DATA:  Patient with right leg  pain and swelling. No chest complaints. EXAM: CHEST  2 VIEW COMPARISON:  Chest radiograph 06/06/2014. FINDINGS: Stable cardiac and mediastinal contours. Unchanged right peritracheal mass. No consolidative pulmonary opacities. No pleural effusion or pneumothorax. Bilateral shoulder joint degenerative changes. Thoracic spine degenerative changes. IMPRESSION: No acute cardiopulmonary process. Electronically Signed   By: Lovey Newcomer M.D.   On: 02/04/2016 15:26   Dg Elbow Complete Left  02/04/2016  CLINICAL DATA:  Left elbow pain after a fall today. EXAM: LEFT ELBOW - COMPLETE 3+ VIEW COMPARISON:  None. FINDINGS: Longitudinal fracture along the medial margin of the proximal ulna extending to the articular surface with involvement of the adjacent coronoid process. Large elbow joint effusion. Spurring of the lateral humeral epicondyle. IMPRESSION: 1. Proximal  longitudinal ulnar fracture involving the medial articular margin and the medial coronoid process. Large elbow joint effusion. Electronically Signed   By: Van Clines M.D.   On: 02/04/2016 15:32   Dg Wrist Complete Left  02/04/2016  CLINICAL DATA:  Left wrist deformity with swelling after fall. EXAM: LEFT WRIST - COMPLETE 3+ VIEW COMPARISON:  None. FINDINGS: Fracture of the distal radius with posterior impaction and apex anterior angulation. Distal articular surface extension in the radius is noted medially. Widened scapholunate interval on the dedicated scaphoid view, suggesting possible scapholunate ligament tear. Scapholunate angle on the lateral projection normal. Subtle periosteal reaction along the base of the ulnar styloid, suspicious for ulnar styloid fracture, nondisplaced. IMPRESSION: 1. Distal radial fracture with transverse metaphyseal component and a component extending to the distal radial articular surface medially. 2. Nondisplaced ulnar styloid fracture. 3. Potential tear of the scapholunate ligament. Electronically Signed   By: Van Clines M.D.   On: 02/04/2016 15:46   EKG: Independently reviewed. normal sinus rhythm.  Assessment/Plan 1. Fall Mechanical fall. Likely from deconditioning. Patient will need physical therapy as well as occupational therapy consult. Likely would benefit from home health PT as well as RN evaluation.  2. Lower extremity edema   Venous insufficiency Patient's lower leg swelling is most likely consistent with venous insufficiency. I will get ultrasound Doppler as well as vascular ABI. I will use dry dressing on her ulcer and will use TED stockings.  3. Closed fracture of styloid process of ulna   Left scapholunate ligament tear   Distal radius fracture Mechanical fall has led to above mentioned fracture. Currently conservative management but her hand surgery. I appreciate input from Dr. Fredna Dow. Patient scheduled to get a CT of the hand  to evaluate for the ulnar fracture. Pain management with when necessary Norco and IV fentanyl.  4.  Chronic anemia Start the patient on iron supplementation. Hb S was elevated on electrophoresis concerning for sickle cell trait. Patient will need outpatient hematology follow-up.  5.  Cognitive decline Per family the patient has progressive cognitive decline over last few months. Outpatient follow-up recommended.  Nutrition: Regular diet DVT Prophylaxis: subcutaneous Heparin  Advance goals of care discussion: Full code   Consults: Hand surgery- Dr Fredna Dow  Family Communication: family was present at bedside, at the time of interview.  Opportunity was given to ask question and all questions were answered satisfactorily.  Disposition: Admitted as observation, med-surge unit. Likely to be discharged 02/05/2016, home with home health  Author: Berle Mull, MD [facility] Pager: [phone] 02/04/2016  If 7PM-7AM, please contact night-coverage [email or web address] Password [email or web address]

## 2016-02-04 NOTE — ED Provider Notes (Signed)
CSN: [id number]     Arrival date & time 02/04/16  1251 History   First MD Initiated Contact with Patient 02/04/16 1303     Chief Complaint  Patient presents with  . Fall  . Wound Infection     (Consider location/radiation/quality/duration/timing/severity/associated sxs/prior Treatment) HPI Comments: Patient is a 70 year old female with a history of hypertension, diabetes and possible lymphedema presenting today with 2 issues. Her initial complaint is a 4 week history of worsening lower extremity edema and wound to the posterior portion of the right calf. The wound is tender and drains a green fluid. She denies any high fevers or significant redness but does have some mild chills with this. She saw her doctor 2 weeks ago and at that time was started on Bactrim for possibility of wound infection but at that time he was thinking more lymphedema or venous insufficiency. Patient's symptoms were not improving in her legs and she felt the swelling may be getting worse so she was on her way here when she fell down the stairs. Her daughter was helping her down the stairs and when she tripped her daughter fell on top of her with a new left wrist deformity and pain. Patient denies hitting her head or loss of consciousness. No shortness of breath, chest pain or palpitations. She denies any abdominal pain, nausea, vomiting, diarrhea.    Patient is a 70 y.o. female presenting with fall. The history is provided by the patient and a relative.  Fall    Past Medical History  Diagnosis Date  . Hypertension   . Diabetes mellitus   . Chronic knee pain     right  . Polio   . Arthritis    Past Surgical History  Procedure Laterality Date  . Abdominal hysterectomy     Family History  Problem Relation Age of Onset  . Diabetes Mellitus II Neg Hx   . Lupus Neg Hx   . Sarcoidosis Neg Hx   . Hypertension    . High Cholesterol    . Ovarian cancer Mother   . COPD Father    Social History  Substance Use  Topics  . Smoking status: Former Smoker -- 0.50 packs/day    Types: Cigarettes    Quit date: 09/09/1968  . Smokeless tobacco: Never Used  . Alcohol Use: No   OB History    No data available     Review of Systems  All other systems reviewed and are negative.     Allergies  Orange concentrate; Peach; Penicillins; Strawberry extract; Aspirin; and Pineapple  Home Medications   Prior to Admission medications   Medication Sig Start Date End Date Taking? Authorizing Provider  doxycycline (VIBRA-TABS) 100 MG tablet Take 1 tablet (100 mg total) by mouth 2 (two) times daily. Patient not taking: Reported on 01/19/2016 10/19/15   Golden Circle, FNP  Ferrous Sulfate (IRON) 325 (65 Fe) MG TABS Take by mouth.    Historical Provider, MD  ibuprofen (ADVIL) 200 MG tablet Take 1 tablet (200 mg total) by mouth every 6 (six) hours as needed for fever or moderate pain. Patient not taking: Reported on 01/19/2016 06/08/14   Silver Huguenin Elgergawy, MD  sulfamethoxazole-trimethoprim (BACTRIM DS,SEPTRA DS) 800-160 MG tablet Take 1 tablet by mouth 2 (two) times daily. 01/19/16   Golden Circle, FNP  traMADol (ULTRAM) 50 MG tablet Take 1 tablet (50 mg total) by mouth every 8 (eight) hours as needed. 10/19/15   Golden Circle, FNP   BP  132/73 mmHg  Pulse 80  Temp(Src) 98.5 F (36.9 C) (Oral)  Resp 20  SpO2 99% Physical Exam  Constitutional: She is oriented to person, place, and time. She appears well-developed and well-nourished. No distress.  HENT:  Head: Normocephalic and atraumatic.  Mouth/Throat: Oropharynx is clear and moist.  Eyes: Conjunctivae and EOM are normal. Pupils are equal, round, and reactive to light.  Neck: Normal range of motion. Neck supple.  Large baseball sized mobile firm mass in the parotid region.  Non-tender and no erythema  Cardiovascular: Normal rate, regular rhythm and intact distal pulses.   Murmur heard.  Systolic murmur is present with a grade of 2/6  Heard best at the  LSB  Pulmonary/Chest: Effort normal and breath sounds normal. No respiratory distress. She has no wheezes. She has no rales.  Abdominal: Soft. She exhibits no distension. There is no tenderness. There is no rebound and no guarding.  Musculoskeletal: Normal range of motion. She exhibits edema and tenderness.       Left elbow: She exhibits normal range of motion and no swelling. Tenderness found. Medial epicondyle tenderness noted. No radial head, no lateral epicondyle and no olecranon process tenderness noted.       Left wrist: She exhibits tenderness, bony tenderness and deformity.       Arms: Skin changes consistent with venous stasis.  3x3cm ulcer to the posterior mid portion of the right calf.  Mild yellow drainage with pink wound bed.  No surrounding erythema but tender to touch.  Significant right knee swelling and bilateral 2+ pitting edema to the upper tib/fib bilaterally  Neurological: She is alert and oriented to person, place, and time.  Skin: Skin is warm and dry. No rash noted. No erythema.  Psychiatric: She has a normal mood and affect. Her behavior is normal.  Nursing note and vitals reviewed.   ED Course  Procedures (including critical care time) Labs Review Labs Reviewed  CBC WITH DIFFERENTIAL/PLATELET - Abnormal; Notable for the following:    Hemoglobin 10.0 (*)    HCT 33.7 (*)    MCV 73.9 (*)    MCH 21.9 (*)    MCHC 29.7 (*)    All other components within normal limits  COMPREHENSIVE METABOLIC PANEL - Abnormal; Notable for the following:    Albumin 3.4 (*)    ALT 12 (*)    All other components within normal limits  BRAIN NATRIURETIC PEPTIDE  I-STAT TROPOININ, ED    Imaging Review Dg Chest 2 View  02/04/2016  CLINICAL DATA:  Patient with right leg pain and swelling. No chest complaints. EXAM: CHEST  2 VIEW COMPARISON:  Chest radiograph 06/06/2014. FINDINGS: Stable cardiac and mediastinal contours. Unchanged right peritracheal mass. No consolidative pulmonary  opacities. No pleural effusion or pneumothorax. Bilateral shoulder joint degenerative changes. Thoracic spine degenerative changes. IMPRESSION: No acute cardiopulmonary process. Electronically Signed   By: Lovey Newcomer M.D.   On: 02/04/2016 15:26   Dg Elbow Complete Left  02/04/2016  CLINICAL DATA:  Left elbow pain after a fall today. EXAM: LEFT ELBOW - COMPLETE 3+ VIEW COMPARISON:  None. FINDINGS: Longitudinal fracture along the medial margin of the proximal ulna extending to the articular surface with involvement of the adjacent coronoid process. Large elbow joint effusion. Spurring of the lateral humeral epicondyle. IMPRESSION: 1. Proximal longitudinal ulnar fracture involving the medial articular margin and the medial coronoid process. Large elbow joint effusion. Electronically Signed   By: Van Clines M.D.   On: 02/04/2016 15:32  Dg Wrist Complete Left  02/04/2016  CLINICAL DATA:  Left wrist deformity with swelling after fall. EXAM: LEFT WRIST - COMPLETE 3+ VIEW COMPARISON:  None. FINDINGS: Fracture of the distal radius with posterior impaction and apex anterior angulation. Distal articular surface extension in the radius is noted medially. Widened scapholunate interval on the dedicated scaphoid view, suggesting possible scapholunate ligament tear. Scapholunate angle on the lateral projection normal. Subtle periosteal reaction along the base of the ulnar styloid, suspicious for ulnar styloid fracture, nondisplaced. IMPRESSION: 1. Distal radial fracture with transverse metaphyseal component and a component extending to the distal radial articular surface medially. 2. Nondisplaced ulnar styloid fracture. 3. Potential tear of the scapholunate ligament. Electronically Signed   By: Van Clines M.D.   On: 02/04/2016 15:46   I have personally reviewed and evaluated these images and lab results as part of my medical decision-making.   EKG Interpretation   Date/Time:  Sunday Feb 04 2016  14:16:02 EDT Ventricular Rate:  84 PR Interval:  154 QRS Duration: 98 QT Interval:  361 QTC Calculation: 427 R Axis:   62 Text Interpretation:  Sinus rhythm Probable left atrial enlargement No  significant change since last tracing Confirmed by Maryan Rued  MD, Loree Fee  [phone]) on 02/04/2016 3:38:04 PM      MDM   Final diagnoses:  None  Patient with 2 issues. Initially with bilateral lower extremity edema with an ongoing wound to the right calf has been at least 2 weeks. There's been no improvement with Bactrim and feel most likely the wound is related to venous insufficiency versus lymphedema. It does not have a cellulitic component.  Patient's BUN, creatinine and BNP are all within normal limits. Patient did note significant worsening of her lower extremity swelling after stopping Lasix for months ago. Feel the patient will need wound care to her lower extremity as well as will restart diuretic.  Blanchie Dessert, MD 02/06/16 [phone]
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
## 2016-02-04 NOTE — ED Notes (Signed)
Pt was walking down steps this am and missed last steps and injured left wrist. Pt has swelling and skin tear to left wrist. Pt also has wound to right loer leg that has been there over 1 month. Wound has yellow drainage and swelling to both lower extremities. Pedal pulses with doppler.

## 2016-02-04 NOTE — Care Management Note (Addendum)
Case Management Note  Patient Details  Name: [name] MRN: [id number] Date of Birth: May 16, 1946  Subjective/Objective:  70 y.o. F who had a fall this where she sustained LUE Distal radial fracture with transverse metaphyseal component and a component extending to the distal radial articular surface, medially. Nondisplaced ulnar styloid fracture. 3. Potential tear of the scapholunate ligament.   Family had purchased w/cprior to ED arrival.  MD consulted CM for PT consult this pm for DME/HH needs if pt is not admitted to hospital. Writer paged PT pager for urgent PT eval as discharge is iminent pending PT evaluation with no return call. Will make MD aware.                   Action/Plan: No CM needs at present. Pt will need PT val to ensure safe discharge home.    Expected Discharge Date:                  Expected Discharge Plan:     In-House Referral:     Discharge planning Services     Post Acute Care Choice:    Choice offered to:     DME Arranged:    DME Agency:     HH Arranged:    [place] Agency:     Status of Service:     Medicare Important Message Given:    Date Medicare IM Given:    Medicare IM give by:    Date Additional Medicare IM Given:    Additional Medicare Important Message give by:     If discussed at [place] of Stay Meetings, dates discussed:    Additional Comments:  Delrae Sawyers, RN 02/04/2016, 4:52 PM

## 2016-02-04 NOTE — ED Provider Notes (Signed)
4:41 PM Assumed care from Dr. Maryan Rued, please see their note for full history, physical and decision making until this point. In brief this is a 70 y.o. year old female who presented to the ED tonight with Fall and Wound Infection     Chronic wound to right leg and fall with radius/ulna fracture to be reduced by ortho. Trying to get wound care and PT set up, if we can't get this set up will need admission (Dr. Posey Pronto ([id number]) agrees to this if we can't get a PT eval in ED).   No PT on call today, will obs for PT eval in the AM.   Labs, studies and imaging reviewed by myself and considered in medical decision making if ordered. Imaging interpreted by radiology.  Labs Reviewed  CBC WITH DIFFERENTIAL/PLATELET - Abnormal; Notable for the following:    Hemoglobin 10.0 (*)    HCT 33.7 (*)    MCV 73.9 (*)    MCH 21.9 (*)    MCHC 29.7 (*)    All other components within normal limits  COMPREHENSIVE METABOLIC PANEL - Abnormal; Notable for the following:    Albumin 3.4 (*)    ALT 12 (*)    All other components within normal limits  BRAIN NATRIURETIC PEPTIDE  I-STAT TROPOININ, ED    DG Wrist Complete Left  Final Result    DG Elbow Complete Left  Final Result    DG Chest 2 View  Final Result      No Follow-up on file.   Merrily Pew, MD 02/04/16 2213

## 2016-02-04 NOTE — ED Notes (Signed)
Attempted report 

## 2016-02-04 NOTE — ED Notes (Signed)
Patient transported to X-ray 

## 2016-02-05 ENCOUNTER — Observation Stay (HOSPITAL_BASED_OUTPATIENT_CLINIC_OR_DEPARTMENT_OTHER): Payer: Medicare Other

## 2016-02-05 DIAGNOSIS — M6281 Muscle weakness (generalized): Secondary | ICD-10-CM | POA: Diagnosis not present

## 2016-02-05 DIAGNOSIS — Z87891 Personal history of nicotine dependence: Secondary | ICD-10-CM | POA: Diagnosis not present

## 2016-02-05 DIAGNOSIS — R531 Weakness: Secondary | ICD-10-CM | POA: Diagnosis not present

## 2016-02-05 DIAGNOSIS — S52002D Unspecified fracture of upper end of left ulna, subsequent encounter for closed fracture with routine healing: Secondary | ICD-10-CM | POA: Diagnosis not present

## 2016-02-05 DIAGNOSIS — M7989 Other specified soft tissue disorders: Secondary | ICD-10-CM | POA: Diagnosis not present

## 2016-02-05 DIAGNOSIS — L0390 Cellulitis, unspecified: Secondary | ICD-10-CM | POA: Diagnosis not present

## 2016-02-05 DIAGNOSIS — S52612A Displaced fracture of left ulna styloid process, initial encounter for closed fracture: Secondary | ICD-10-CM | POA: Diagnosis not present

## 2016-02-05 DIAGNOSIS — M25561 Pain in right knee: Secondary | ICD-10-CM | POA: Diagnosis not present

## 2016-02-05 DIAGNOSIS — I872 Venous insufficiency (chronic) (peripheral): Secondary | ICD-10-CM | POA: Diagnosis not present

## 2016-02-05 DIAGNOSIS — D649 Anemia, unspecified: Secondary | ICD-10-CM | POA: Diagnosis not present

## 2016-02-05 DIAGNOSIS — E119 Type 2 diabetes mellitus without complications: Secondary | ICD-10-CM | POA: Diagnosis not present

## 2016-02-05 DIAGNOSIS — W19XXXA Unspecified fall, initial encounter: Secondary | ICD-10-CM | POA: Diagnosis not present

## 2016-02-05 DIAGNOSIS — R4189 Other symptoms and signs involving cognitive functions and awareness: Secondary | ICD-10-CM | POA: Diagnosis not present

## 2016-02-05 DIAGNOSIS — R262 Difficulty in walking, not elsewhere classified: Secondary | ICD-10-CM | POA: Diagnosis not present

## 2016-02-05 DIAGNOSIS — Z993 Dependence on wheelchair: Secondary | ICD-10-CM | POA: Diagnosis not present

## 2016-02-05 DIAGNOSIS — S52502A Unspecified fracture of the lower end of left radius, initial encounter for closed fracture: Secondary | ICD-10-CM | POA: Diagnosis not present

## 2016-02-05 DIAGNOSIS — R609 Edema, unspecified: Secondary | ICD-10-CM | POA: Diagnosis not present

## 2016-02-05 DIAGNOSIS — G8929 Other chronic pain: Secondary | ICD-10-CM | POA: Diagnosis not present

## 2016-02-05 DIAGNOSIS — Z880 Allergy status to penicillin: Secondary | ICD-10-CM | POA: Diagnosis not present

## 2016-02-05 DIAGNOSIS — I10 Essential (primary) hypertension: Secondary | ICD-10-CM | POA: Diagnosis not present

## 2016-02-05 DIAGNOSIS — S638X2A Sprain of other part of left wrist and hand, initial encounter: Secondary | ICD-10-CM | POA: Diagnosis not present

## 2016-02-05 DIAGNOSIS — K219 Gastro-esophageal reflux disease without esophagitis: Secondary | ICD-10-CM | POA: Diagnosis not present

## 2016-02-05 MED ORDER — FUROSEMIDE 20 MG PO TABS
20.0000 mg | ORAL_TABLET | Freq: Every day | ORAL | Status: DC
  Filled 2016-02-05: qty 1

## 2016-02-05 MED ORDER — SULFAMETHOXAZOLE-TRIMETHOPRIM 800-160 MG PO TABS: 1.0000 | ORAL_TABLET | Freq: Two times a day (BID) | ORAL | Status: AC

## 2016-02-05 MED ORDER — HYDROCODONE-ACETAMINOPHEN 5-325 MG PO TABS: 1.0000 | ORAL_TABLET | Freq: Three times a day (TID) | ORAL | Status: DC | PRN

## 2016-02-05 MED ORDER — FUROSEMIDE 20 MG PO TABS: 20.0000 mg | ORAL_TABLET | Freq: Every day | ORAL | Status: DC

## 2016-02-05 MED ORDER — IRON 325 (65 FE) MG PO TABS: 325.0000 mg | ORAL_TABLET | Freq: Every day | ORAL | Status: DC

## 2016-02-05 MED ORDER — DOCUSATE SODIUM 100 MG PO CAPS: 100.0000 mg | ORAL_CAPSULE | Freq: Two times a day (BID) | ORAL | Status: DC

## 2016-02-05 NOTE — Progress Notes (Signed)
VASCULAR LAB PRELIMINARY  PRELIMINARY  PRELIMINARY  PRELIMINARY  Bilateral lower extremity venous duplex completed.    Preliminary report:  There is no DVT or SVT noted in the bilateral lower extremities.   Marcele Kosta, RVT 02/05/2016, 10:53 AM

## 2016-02-05 NOTE — Care Management Obs Status (Signed)
[place] NOTIFICATION   Patient Details  Name: [name] MRN: [id number] Date of Birth: [birth year]   Medicare Observation Status Notification Given:  Yes    Nila Nephew, RN 02/05/2016, 12:18 PM

## 2016-02-05 NOTE — Clinical Social Work Placement (Signed)
   CLINICAL SOCIAL WORK PLACEMENT  NOTE  Date:  02/05/2016  Patient Details  Name: [name] MRN: [id number] Date of Birth: 01/22/1946  Clinical Social Work is seeking post-discharge placement for this patient at the [place] level of care (*CSW will initial, date and re-position this form in  chart as items are completed):  Yes   Patient/family provided with [place] Work Department's list of facilities offering this level of care within the geographic area requested by the patient (or if unable, by the patient's family).  Yes   Patient/family informed of their freedom to choose among providers that offer the needed level of care, that participate in Medicare, Medicaid or managed care program needed by the patient, have an available bed and are willing to accept the patient.  Yes   Patient/family informed of [facility]'s ownership interest in [facility] and [facility], as well as of the fact that they are under no obligation to receive care at these facilities.  PASRR submitted to EDS on 02/05/16     PASRR number received on 02/05/16     Existing PASRR number confirmed on       FL2 transmitted to all facilities in geographic area requested by pt/family on 02/05/16     FL2 transmitted to all facilities within larger geographic area on       Patient informed that his/her managed care company has contracts with or will negotiate with certain facilities, including the following:        Yes   Patient/family informed of bed offers received.  Patient chooses bed at [facility]     Physician recommends and patient chooses bed at      Patient to be transferred to [facility] on 02/05/16.  Patient to be transferred to facility by PTAR     Patient family notified on 02/05/16 of transfer.  Name of family member notified:  patient is alert and oriented x4     PHYSICIAN Please sign FL2     Additional Comment:     _______________________________________________ Dulcy Fanny, LCSW 02/05/2016, 3:53 PM

## 2016-02-05 NOTE — Clinical Social Work Note (Signed)
Clinical Social Work Assessment  Patient Details  Name: [name] MRN: [id number] Date of Birth: 02-Feb-1946  Date of referral:  02/05/16               Reason for consult:  Facility Placement                Permission sought to share information with:  Facility Art therapist granted to share information::  Yes, Verbal Permission Granted  Name::        Agency::   [facility] first choice.  Permission received to contact [facility])  Relationship::     Contact Information:     Housing/Transportation Living arrangements for the past 2 months:  [place] of Information:  Patient Patient Interpreter Needed:  None Criminal Activity/Legal Involvement Pertinent to Current Situation/Hospitalization:  No - Comment as needed Significant Relationships:  Adult Children (son) Lives with:  Adult Children (son) Do you feel safe going back to the place where you live?  Yes Need for family participation in patient care:  No (Coment)  Care giving concerns:  No caregivers present at time of discharge.   Social Worker assessment / plan:  CSW met with patient at bedside to review patient's disposition. Patient is agreeable to SNF at this time.  Patient states she has been to a facility in the past, but cannot remember the name of the facility.  Patient has chosen [facility] and the SNF has offered a bed.  Patient is requesting PTAR transportation for convenience sake.  Patient states her adult son lives with her at home and offers very limited assistance.  Employment status:  Retired Forensic scientist:    PT Recommendations:  [place] / Referral to community resources:  [place]  Patient/Family's Response to care:  Patient is agreeable to SNF.  Patient/Family's Understanding of and Emotional Response to Diagnosis, Current Treatment, and Prognosis:  Patient is understanding of her  medical complaints and ongoing issues.  Patient expresses appropriately regarding her awareness of her prognosis and quality of life at time of discharge.  Emotional Assessment Appearance:  Appears stated age Attitude/Demeanor/Rapport:    Affect (typically observed):  Accepting Orientation:  Oriented to Self, Oriented to Place, Oriented to  Time, Oriented to Situation Alcohol / Substance use:  Not Applicable Psych involvement (Current and /or in the community):  No (Comment)  Discharge Needs  Concerns to be addressed:  No discharge needs identified Readmission within the last 30 days:  No Current discharge risk:  None Barriers to Discharge:  No Barriers Identified   Dulcy Fanny, LCSW 02/05/2016, 3:48 PM

## 2016-02-05 NOTE — Care Management Note (Signed)
Case Management Note  Patient Details  Name: [name] MRN: [id number] Date of Birth: January 06, 1946  Subjective/Objective:               Admitted with fall, radial ulnar fracture     Action/Plan: PT/OT recommending SNF, referral made to CSW, [place] working on placement for short term rehab.    Expected Discharge Date:  02/05/16               Expected Discharge Plan:  Skilled Nursing Facility  In-House Referral:  Clinical Social Work  Discharge planning Services  CM Consult  Post Acute Care Choice:    Choice offered to:     DME Arranged:    DME Agency:     HH Arranged:    [place] Agency:     Status of Service:  In process, will continue to follow  Medicare Important Message Given:    Date Medicare IM Given:    Medicare IM give by:    Date Additional Medicare IM Given:    Additional Medicare Important Message give by:     If discussed at [place] of Stay Meetings, dates discussed:    Additional Comments:  Nila Nephew, RN 02/05/2016, 12:19 PM

## 2016-02-05 NOTE — Evaluation (Signed)
Physical Therapy Evaluation Patient Details Name: [name] MRN: [id number] DOB: 01/29/1946 Today's Date: 02/05/2016   History of Present Illness  70 y.o. female with Past medical history of essential hypertension, chronic anemia, diet-controlled diabetes, recurrent cellulitis. Pt fell down front steps and daughter fell on top of her, resulting in L proximal ulna fx and distal radius fx. Underwent closed manipulation.   Clinical Impression  Pt admitted with above diagnosis. Pt currently with functional limitations due to the deficits listed below (see PT Problem List). Pt requires +2 mod A for transfers and +2 min A for ambulation. Unsafe to mobilize alone and does not remember precautions.   Pt will benefit from skilled PT to increase their independence and safety with mobility to allow discharge to the venue listed below.       Follow Up Recommendations SNF;Supervision/Assistance - 24 hour    Equipment Recommendations  None recommended by PT    Recommendations for Other Services       Precautions / Restrictions Precautions Precautions: Fall Required Braces or Orthoses: Other Brace/Splint Other Brace/Splint: LUE sling Restrictions Weight Bearing Restrictions: Yes LUE Weight Bearing: Non weight bearing LLE Weight Bearing: Non weight bearing Other Position/Activity Restrictions: none noted in chart. Kept NWB      Mobility  Bed Mobility Overal bed mobility: Needs Assistance Bed Mobility: Sit to Supine     Supine to sit: HOB elevated;Min assist Sit to supine: Mod assist;+2 for safety/equipment   General bed mobility comments: pt with posterior lean EOB, needed trunk support and guidance down to bed as well as mod A to bilateral LE's for into bed, pt fearful of falling when EOB  Transfers Overall transfer level: Needs assistance Equipment used: None Transfers: Sit to/from Stand Sit to Stand: Mod assist;+2 physical assistance Stand pivot transfers: Mod assist;+2  physical assistance       General transfer comment: mod A +2 for sit to stand from recliner, frequent vc's for NWB LUE (seems that pt leaned left away from straight knee to rise before fx)  Ambulation/Gait Ambulation/Gait assistance: Min assist;+2 physical assistance Ambulation Distance (Feet): 5 Feet Assistive device: Straight cane Gait Pattern/deviations: Wide base of support;Decreased weight shift to right Gait velocity: decreased Gait velocity interpretation: <1.8 ft/sec, indicative of risk for recurrent falls General Gait Details: SPC right side with wide base and circumduction of RLE  Stairs            Wheelchair Mobility    Modified Rankin (Stroke Patients Only)       Balance Overall balance assessment: Needs assistance;History of Falls Sitting-balance support: Single extremity supported Sitting balance-Leahy Scale: Poor Sitting balance - Comments: min A to prevent posterior LOB Postural control: Posterior lean Standing balance support: During functional activity;Single extremity supported Standing balance-Leahy Scale: Poor Standing balance comment: requires UE support to maintain standing                             Pertinent Vitals/Pain Pain Assessment: 0-10 Pain Score: 8  Pain Location: LUE Pain Descriptors / Indicators: Aching Pain Intervention(s): Repositioned;Limited activity within patient's tolerance;Monitored during session    Home Living Family/patient expects to be discharged to:: Skilled nursing facility Living Arrangements: Other relatives Available Help at Discharge: Available PRN/intermittently Type of Home: House Home Access: Stairs to enter Entrance Stairs-Rails: Psychiatric nurse of Steps: 6 Home Layout: Two level;Bed/bath upstairs;1/2 bath on main level;Other (Comment) Home Equipment: Cane - single point;Wheelchair - manual Additional Comments:  unsure of information due to pt's cognitive status    Prior  Function Level of Independence: Independent with assistive device(s)         Comments: "took me awhile"     Hand Dominance   Dominant Hand: Right    Extremity/Trunk Assessment   Upper Extremity Assessment: Defer to OT evaluation       LUE Deficits / Details: splinted in sugar tong splint. Edematous fingers. Unable to complete full fist due to pain and edema   Lower Extremity Assessment: RLE deficits/detail;LLE deficits/detail;Generalized weakness RLE Deficits / Details: pt with OA right knee with little to no flexion as well as wounds posterior calf. Hip flex 2/5, abd/ add 2/5 LLE Deficits / Details: limited ROM at knee, 0-90 deg, generalized weakness noted  Cervical / Trunk Assessment: Kyphotic  Communication   Communication: No difficulties  Cognition Arousal/Alertness: Awake/alert Behavior During Therapy: WFL for tasks assessed/performed Overall Cognitive Status: No family/caregiver present to determine baseline cognitive functioning       Memory: Decreased recall of precautions;Decreased short-term memory              General Comments      Exercises Other Exercises Other Exercises: LUE elevated Other Exercises: encouraged frequent P/A/AAROM L digits      Assessment/Plan    PT Assessment Patient needs continued PT services  PT Diagnosis Difficulty walking;Abnormality of gait;Generalized weakness;Acute pain;Altered mental status   PT Problem List Decreased strength;Decreased range of motion;Decreased activity tolerance;Decreased balance;Decreased mobility;Decreased cognition;Decreased knowledge of precautions;Pain;Decreased skin integrity  PT Treatment Interventions DME instruction;Gait training;Functional mobility training;Therapeutic activities;Therapeutic exercise;Balance training;Cognitive remediation;Patient/family education   PT Goals (Current goals can be found in the Care Plan section) Acute Rehab PT Goals Patient Stated Goal: to get better PT  Goal Formulation: With patient Time For Goal Achievement: 02/19/16 Potential to Achieve Goals: Good    Frequency Min 3X/week   Barriers to discharge Decreased caregiver support alone several hours per day    Co-evaluation               End of Session Equipment Utilized During Treatment: Gait belt Activity Tolerance: Patient tolerated treatment well Patient left: in bed;with call bell/phone within reach;with bed alarm set Nurse Communication: Mobility status    Functional Assessment Tool Used: clinical judgement Functional Limitation: Mobility: Walking and moving around Mobility: Walking and Moving Around Current Status [id number]): At least 60 percent but less than 80 percent impaired, limited or restricted Mobility: Walking and Moving Around Goal Status [phone]): At least 20 percent but less than 40 percent impaired, limited or restricted    Time: 0936-0956 PT Time Calculation (min) (ACUTE ONLY): 20 min   Charges:   PT Evaluation $PT Eval Moderate Complexity: 1 Procedure     PT G Codes:   PT G-Codes **NOT FOR INPATIENT CLASS** Functional Assessment Tool Used: clinical judgement Functional Limitation: Mobility: Walking and moving around Mobility: Walking and Moving Around Current Status [id number]): At least 60 percent but less than 80 percent impaired, limited or restricted Mobility: Walking and Moving Around Goal Status [phone]): At least 20 percent but less than 40 percent impaired, limited or restricted  Leighton Roach, PT  Acute Rehab Services  [place], [place] 02/05/2016, 11:43 AM

## 2016-02-05 NOTE — Clinical Social Work Note (Signed)
Patient will discharge today per MD order. Patient will discharge to: [facility] RN to call report prior to transportation to: [phone] Transportation: [facility] to be scheduled at 4:30pm  CSW sent discharge summary to SNF for review.  Packet is complete.  RN, patient and family aware of discharge plans.  Nonnie Done, LCSW [phone]  5N1-9, 2S 15-16 and Psychiatric Service Line  Licensed Clinical Social Worker

## 2016-02-05 NOTE — Progress Notes (Signed)
Got a phone call from [place] about pt SNF disposition. Being notified that pt is arranged to transport to [facility] at 4:30 pm. Attempted call pt's daughter and pt's sister but failed. I left a voice message to her daughter's phone. Will try to call family later.

## 2016-02-05 NOTE — Progress Notes (Signed)
VASCULAR LAB PRELIMINARY  ARTERIAL  ABI completed:WNL    RIGHT    LEFT    PRESSURE WAVEFORM  PRESSURE WAVEFORM  BRACHIAL 124 T BRACHIAL Sling/cast   DP   DP    AT 127 B AT 130 B  PT 119 B PT 129 B  PER   PER    GREAT TOE  NA GREAT TOE  NA    RIGHT LEFT  ABI 1.02 1.05     Feiga Nadel, RVT 02/05/2016, 10:55 AM

## 2016-02-05 NOTE — Progress Notes (Signed)
Occupational Therapy Evaluation and Treatment Patient Details Name: [name] MRN: [id number] DOB: [birth year] Today's Date: 02/05/2016    History of Present Illness 70 y.o. female with Past medical history of essential hypertension, chronic anemia, diet-controlled diabetes, recurrent cellulitis. Pt fell down front steps and daughter fell on top of her, resulting in L proximal ulna fx and distal radius fx. Underwent closed manipulation.    Clinical Impression   Pt seen for 2 sessions this am. PTA, pt reports she lived with her grand daughter and was mod I with ADL and mobility @ cane level. States she is by herself 4-6 hrs/day while grand daughter works. Currently, pt requires mod A +2 with transfers due to R knee contracture and inability to use LUE, which she relied heavily on for transfers PTA. Max A with ADL.  Pt with apparent cognitive deficits. Pt unable to recall NWB status LUE despite being told approximately 10 times and has poor insight and awareness  into how her deficits affect her ability to function safely at home. Recommend rehab at SNF to facilitate safe D/C home and reduce likelihood of readmission. Discussed with SW and CM. No family available during session. Will follow acutely to address established goals.     Follow Up Recommendations  SNF;Supervision/Assistance - 24 hour    Equipment Recommendations  3 in 1 bedside comode;Tub/shower bench;Hospital bed    Recommendations for Other Services       Precautions / Restrictions Precautions Precautions: Fall Required Braces or Orthoses: Other Brace/Splint (sugar tong splint L UE) Restrictions Weight Bearing Restrictions: Yes LLE Weight Bearing: Non weight bearing Other Position/Activity Restrictions: none noted in chart. Kept NWB      Mobility Bed Mobility Overal bed mobility: Needs Assistance Bed Mobility: Supine to Sit;Sit to Supine     Supine to sit: HOB elevated;Min assist Sit to supine: Mod assist;+2  for physical assistance   General bed mobility comments: unable to maintain NWB status during bed mobility  Transfers Overall transfer level: Needs assistance Equipment used: Straight cane Transfers: Sit to/from [employer] Transfers Sit to Stand: Mod assist;+2 physical assistance Stand pivot transfers: Mod assist;+2 physical assistance       General transfer comment: Better second session, requiring +2 min A for stand pivot with improved balance.Trys to use LUE for mobility due to being heavily reliant on LUE prior to fall.    Balance Overall balance assessment: History of Falls                                          ADL Overall ADL's : Needs assistance/impaired     Grooming: Moderate assistance   Upper Body Bathing: Moderate assistance   Lower Body Bathing: Maximal assistance;Sit to/from stand   Upper Body Dressing : Moderate assistance   Lower Body Dressing: Maximal assistance   Toilet Transfer: +2 for physical assistance;Moderate assistance   Toileting- Clothing Manipulation and Hygiene: Moderate assistance       Functional mobility during ADLs: +2 for physical assistance;Moderate assistance;Cane;Cueing for safety;Cueing for sequencing General ADL Comments: Pt with significant posterior lean in sitting and standing. Appears affected by attention. Better midline awareness during second session.      Vision     Perception     Praxis      Pertinent Vitals/Pain Pain Assessment: 0-10 Pain Score: 8  Pain Location: LUE Pain Descriptors / Indicators: Aching  Hand Dominance Right   Extremity/Trunk Assessment Upper Extremity Assessment Upper Extremity Assessment: LUE deficits/detail LUE Deficits / Details: splinted in sugar tong splint. Edematous fingers. Unable to complete full fist due to pain and edema LUE: Unable to fully assess due to immobilization   Lower Extremity Assessment Lower Extremity Assessment: RLE  deficits/detail;LLE deficits/detail RLE Deficits / Details: contracted in extension; wounds on LLE LLE Deficits / Details: limited ROM R knee to @ 90 degrees flexion.   Cervical / Trunk Assessment Cervical / Trunk Assessment: Kyphotic   Communication Communication Communication: No difficulties   Cognition Arousal/Alertness: Awake/alert Behavior During Therapy: WFL for tasks assessed/performed Overall Cognitive Status: No family/caregiver present to determine baseline cognitive functioning (apparent cognitive deficits. Unable to recall NWB status )       Memory: Decreased recall of precautions;Decreased short-term memory             General Comments       Exercises Exercises: Other exercises Other Exercises Other Exercises: LUE elevated Other Exercises: encouraged frequent P/A/AAROM L digits   Shoulder Instructions      Home Living Family/patient expects to be discharged to:: Private residence Living Arrangements: Other relatives (grand daughter) Available Help at Discharge: Available PRN/intermittently (by herself 4-6 hrs) Type of Home: House Home Access: Stairs to enter [employer] of Steps: 6 Entrance Stairs-Rails: Right;Left Home Layout: Two level;Bed/bath upstairs;1/2 bath on main level;Other (Comment) (stays downstairs in converted dining room/bedroom) Alternate Level Stairs-Number of Steps: flight Alternate Level Stairs-Rails: Right;Left Bathroom Shower/Tub: Tub/shower unit;Curtain Shower/tub characteristics: Architectural technologist: Standard Bathroom Accessibility: Yes How Accessible: Accessible via walker Home Equipment: [employer] - single point;Wheelchair - manual   Additional Comments: unsure of information due to pt's cognitive status      Prior Functioning/Environment Level of Independence: Independent with assistive device(s)        Comments: "took me awhile"    OT Diagnosis: Generalized weakness;Cognitive deficits;Acute pain   OT  Problem List: Decreased strength;Decreased range of motion;Decreased activity tolerance;Impaired balance (sitting and/or standing);Decreased coordination;Decreased cognition;Decreased safety awareness;Decreased knowledge of use of DME or AE;Decreased knowledge of precautions;Impaired UE functional use;Pain;Increased edema   OT Treatment/Interventions: Self-care/ADL training;Therapeutic exercise;DME and/or AE instruction;Therapeutic activities;Cognitive remediation/compensation;Patient/family education;Balance training    OT Goals(Current goals can be found in the care plan section) Acute Rehab OT Goals Patient Stated Goal: to get better OT Goal Formulation: Patient unable to participate in goal setting Time For Goal Achievement: 02/19/16 Potential to Achieve Goals: Good  OT Frequency: Min 2X/week   Barriers to D/C: Decreased caregiver support          Co-evaluation              End of Session Equipment Utilized During Treatment: Gait belt Nurse Communication: Mobility status;Precautions;Weight bearing status  Activity Tolerance: Patient tolerated treatment well Patient left: in chair (bed after second session)    Second session, part co-treat with PT - needed due to pt/therapist safety Time: 0825-0900; second visit [id number]) OT Time Calculation (min): 35 min(first visit); 18 min (second visit) Charges:  OT Evaluation $OT Eval Low Complexity:  (2 visits) $OT Eval Moderate Complexity: 1 Procedure OT Treatments $Self Care/Home Management : 23-37 mins G-Codes: OT G-codes **NOT FOR INPATIENT CLASS** Functional Assessment Tool Used: clinical judgement Functional Limitation: Self care Self Care Current Status [id number]): At least 60 percent but less than 80 percent impaired, limited or restricted Self Care Goal Status [id number]): At least 1 percent but less than 20 percent impaired, limited or restricted  [facility] 02/05/2016,  10:11 AM  [facility], OTR/L   [phone] 02/05/2016

## 2016-02-05 NOTE — NC FL2 (Signed)
[place] LEVEL OF CARE SCREENING TOOL     IDENTIFICATION  Patient Name: [name] Birthdate: [birth year] Sex: female Admission Date (Current Location): 02/04/2016  [facility] and [place] Number:  Herbalist and Address:  [facility] [place], [place] [address] [place], [place] [address]      Provider Number: [id number]  Attending Physician Name and Address:  Lavina Hamman, MD  Relative Name and Phone Number:       Current Level of Care: Hospital Recommended Level of Care: [place] Prior Approval Number:    Date Approved/Denied:   PASRR Number: [id number]  Discharge Plan: SNF    Current Diagnoses: Patient Active Problem List   Diagnosis Date Noted  . Radius fracture 02/04/2016  . Fall 02/04/2016  . Closed fracture of styloid process of ulna 02/04/2016  . Left scapholunate ligament tear 02/04/2016  . Distal radius fracture 02/04/2016  . Chronic anemia 02/04/2016  . Cognitive decline 02/04/2016  . Cellulitis 01/19/2016  . Swelling of joint, knee, right 10/19/2015  . Venous insufficiency 10/19/2015  . Lower extremity edema 05/24/2015  . Type 2 diabetes mellitus, controlled ([place]) 05/24/2015  . Decreased appetite 05/24/2015  . Malnutrition of moderate degree ([place]) 06/07/2014  . Acute pericarditis, unspecified 06/07/2014  . Hypoglycemia 06/06/2014  . Fever 06/06/2014  . ECG abnormal 06/06/2014  . Knee pain, chronic 06/06/2014  . Mass of right side of neck 06/06/2014  . Severe protein-calorie malnutrition ([place]) 06/06/2014  . Microcytic anemia 06/06/2014    Orientation RESPIRATION BLADDER Height & Weight     Self, Time, Situation, Place  Normal Incontinent Weight:   Height:     BEHAVIORAL SYMPTOMS/MOOD NEUROLOGICAL BOWEL NUTRITION STATUS      Continent    AMBULATORY STATUS COMMUNICATION OF NEEDS Skin   Limited Assist Verbally Surgical wounds                       Personal Care Assistance Level  of Assistance  Dressing, Bathing Bathing Assistance: Limited assistance   Dressing Assistance: Limited assistance     Functional Limitations Info             SPECIAL CARE FACTORS FREQUENCY  OT (By licensed OT), PT (By licensed PT)     PT Frequency: daily OT Frequency: daily            Contractures Contractures Info: Present (right knee)    Additional Factors Info  Allergies   Allergies Info: Orange Concentrate, Peach, Penicillins, Strawberry Extract, Aspirin, Adhesive, Latex, Pineapple, Strawberry Flavor           Current Medications (02/05/2016):  This is the current hospital active medication list Current Facility-Administered Medications  Medication Dose Route Frequency Provider Last Rate Last Dose  . acetaminophen (TYLENOL) tablet 650 mg  650 mg Oral Q6H PRN Lavina Hamman, MD       Or  . acetaminophen (TYLENOL) suppository 650 mg  650 mg Rectal Q6H PRN Lavina Hamman, MD      . docusate sodium (COLACE) capsule 100 mg  100 mg Oral BID Lavina Hamman, MD   100 mg at 02/05/16 [id number]  . enoxaparin (LOVENOX) injection 40 mg  40 mg Subcutaneous Q24H Lavina Hamman, MD   40 mg at 02/04/16 1958  . fentaNYL (SUBLIMAZE) injection 25 mcg  25 mcg Intravenous Q3H PRN Lavina Hamman, MD      . ferrous sulfate tablet 325 mg  325  mg Oral BID WC Lavina Hamman, MD   325 mg at 02/05/16 [id number]  . HYDROcodone-acetaminophen (NORCO/VICODIN) 5-325 MG per tablet 1 tablet  1 tablet Oral Q6H PRN Lavina Hamman, MD   1 tablet at 02/05/16 [phone]  . ondansetron (ZOFRAN) tablet 4 mg  4 mg Oral Q6H PRN Lavina Hamman, MD       Or  . ondansetron [facility]) injection 4 mg  4 mg Intravenous Q6H PRN Lavina Hamman, MD      . sulfamethoxazole-trimethoprim (BACTRIM DS,SEPTRA DS) 800-160 MG per tablet 1 tablet  1 tablet Oral BID Lavina Hamman, MD   1 tablet at 02/05/16 [id number]     Discharge Medications: Please see discharge summary for a list of discharge medications.  Relevant Imaging  Results:  Relevant Lab Results:   Additional Information SSN: [id number]  Dulcy Fanny, LCSW

## 2016-02-05 NOTE — Discharge Summary (Signed)
[facility] Discharge Summary   [name] [id number]   PCP: Mauricio Po, FNP DOB: 07-01-1946   Date of admission: 02/04/2016   Date of discharge:  02/05/2016    Discharge Diagnoses:  Principal Problem:   Fall Active Problems:   Lower extremity edema   Venous insufficiency   Closed fracture of styloid process of ulna   Left scapholunate ligament tear   Distal radius fracture   Chronic anemia   Cognitive decline  Recommendations for Outpatient Follow-up:  1. Please follow-up with PCP in one week. 2. Please follow-up with hand surgery in 1 week. 3. Do not take Lasix beyond 1 week on a regular daily basis, further dosing only after evaluation by PCP.  Follow-up Information    Follow up with Mauricio Po, [place]. Schedule an appointment as soon as possible for a visit in 1 week.   Specialty:  Family Medicine   Contact information:   [address] [place] [address] [phone]       Follow up with Tennis Must, MD. Call in 1 week.   Specialty:  Orthopedic Surgery   Why:  for follow up.    Contact information:   [place] [address] [phone]      Diet recommendation: Low-salt diet  Activity: The patient is advised to gradually reintroduce usual activities.  Discharge Condition: good  History of present illness: As per the H and P dictated on admission, "[name] is a 70 y.o. female with Past medical history of essential hypertension, chronic anemia, diet-controlled diabetes, recurrent cellulitis. The patient presents to ER with a fall. Patient was coming to ER with her daughter for complaints of bilateral leg swelling with ulcer development and missed her footing and fell on the ground with her daughter falling on top of her with injury to her left hand. Patient denies any complaints of headache or dizziness or head injury or neck pain. Also now complains of chest pain or shortness of breath or cough or  palpitation. No vision changes, tingling or numbness. No recent nausea vomiting or diarrhea at home.  Patient has swelling of her legs chronically and was on initially hydrochlorothiazide and later on Lasix with improvement in the swelling. Patient was taken off the medication due to improvement in blood pressure many months ago. Since last 2-3 months the patient has been noticing progressively worsening swelling without any orthopnea or PND. Patient has also noted blisters and weeping from the leg. This particular ulcer has been present since last 2 weeks and initially started as a blister and currently causes burning pain. Patient was not able to complete Doppler ultrasound as an outpatient.  ED Course: Presented with fall with left hand pain, x-ray shows radial ulnar fracture. Hand surgery recommends conservative management and Dr. Fredna Dow reduced the fracture in the ER. Patient was not able to get PT eval in the ER and was recommended to be admitted in the hospital."  Hospital Course:  Summary of her active problems in the hospital is as following.  1. Fall Mechanical fall. Likely from deconditioning. Patient will need physical therapy as well as occupational therapy. Plan is to discharge at SNF  2. Lower extremity edema  Venous insufficiency Patient's lower leg swelling is most likely consistent with venous insufficiency. Normal ultrasound Doppler as well as vascular ABI. I will use dry dressing on her ulcer and will use TED stockings.  3. Closed fracture of styloid process of ulna  Left scapholunate ligament  tear  Distal radius fracture Mechanical fall has led to above mentioned fracture. Currently conservative management but her hand surgery. I appreciate input from Dr. Fredna Dow. CT of the hand to evaluate for the ulnar fracture, improved Pain management with when necessary Norco.  4. Chronic anemia Start the patient on iron supplementation. Hb S was elevated on  electrophoresis concerning for sickle cell trait. Patient will need outpatient hematology follow-up. per PCP  5. Cognitive decline Per family the patient has progressive cognitive decline over last few months. Outpatient follow-up recommended.  All other chronic medical condition were stable during the hospitalization.  Patient was seen by physical therapy, who recommended SNF, which was arranged by Education officer, museum and case Freight forwarder. On the day of the discharge the patient's pain was well controlled and no further workup was planned in the hospital, and no other acute medical condition were reported by patient. the patient was felt safe to be discharge at SNF with therapy.  Procedures and Results:  Lower extremity venous Doppler Preliminary report- no evidence of DVT or SVT in bilateral lower extremities   Vascular ABI  Preliminary report- bilateral ABI more than 1  Consultations:  Hand surgery  DISCHARGE MEDICATION: Current Discharge Medication List    START taking these medications   Details  docusate sodium (COLACE) 100 MG capsule Take 1 capsule (100 mg total) by mouth 2 (two) times daily. Qty: 10 capsule, Refills: 0    furosemide (LASIX) 20 MG tablet Take 1 tablet (20 mg total) by mouth daily. Qty: 7 tablet, Refills: 0    HYDROcodone-acetaminophen (NORCO/VICODIN) 5-325 MG tablet Take 1 tablet by mouth every 8 (eight) hours as needed for moderate pain. Qty: 20 tablet, Refills: 0      CONTINUE these medications which have CHANGED   Details  Ferrous Sulfate (IRON) 325 (65 Fe) MG TABS Take 325 mg by mouth daily at 12 noon. Qty: 30 each, Refills: 0    sulfamethoxazole-trimethoprim (BACTRIM DS,SEPTRA DS) 800-160 MG tablet Take 1 tablet by mouth 2 (two) times daily. Qty: 20 tablet, Refills: 0   Associated Diagnoses: Cellulitis of lower extremity, unspecified laterality       Allergies  Allergen Reactions  . Orange Concentrate [Flavoring Agent] Shortness Of Breath,  Itching and Swelling  . Peach [Prunus Persica] Shortness Of Breath, Itching and Swelling  . Penicillins Anaphylaxis and Hives    All 'cillins' Has patient had a PCN reaction causing immediate rash, facial/tongue/throat swelling, SOB or lightheadedness with hypotension: Yes Has patient had a PCN reaction causing severe rash involving mucus membranes or skin necrosis: No Has patient had a PCN reaction that required hospitalization No Has patient had a PCN reaction occurring within the last 10 years: No If all of the above answers are "NO", then may proceed with Cephalosporin use.   . Strawberry Extract Shortness Of Breath, Itching and Swelling  . Aspirin Itching  . Adhesive [Tape] Itching and Rash    Please use "paper" tape  . Latex Itching and Rash  . Pineapple Itching and Rash  . Strawberry Flavor Itching and Rash   Discharge Instructions    Diet - low sodium heart healthy    Complete by:  As directed      Discharge instructions    Complete by:  As directed   It is important that you read following instructions as well as go over your medication list with RN to help you understand your care after this hospitalization.  Discharge Instructions: Please follow-up with PCP in  one week  Please request your primary care physician to go over all Hospital Tests and Procedure/Radiological results at the follow up,  Please get all Hospital records sent to your PCP by signing hospital release before you go home.   Do not drive, operating heavy machinery, perform activities at heights, swimming or participation in water activities or provide baby sitting services while your are on Pain, Sleep and Anxiety Medications; until you have been seen by Primary Care Physician or a Neurologist and advised to do so again. Do not take more than prescribed Pain, Sleep and Anxiety Medications. You were cared for by a hospitalist during your hospital stay. If you have any questions about your discharge  medications or the care you received while you were in the hospital after you are discharged, you can call the unit and ask to speak with the hospitalist on call if the hospitalist that took care of you is not available.  Once you are discharged, your primary care physician will handle any further medical issues. Please note that NO REFILLS for any discharge medications will be authorized once you are discharged, as it is imperative that you return to your primary care physician (or establish a relationship with a primary care physician if you do not have one) for your aftercare needs so that they can reassess your need for medications and monitor your lab values. You Must read complete instructions/literature along with all the possible adverse reactions/side effects for all the Medicines you take and that have been prescribed to you. Take any new Medicines after you have completely understood and accept all the possible adverse reactions/side effects. Wear Seat belts while driving. If you have smoked or chewed Tobacco in the last 2 yrs please stop smoking and/or stop any Recreational drug use.     Increase activity slowly    Complete by:  As directed           Discharge Exam: There were no vitals filed for this visit. Filed Vitals:   02/04/16 2208 02/05/16 0545  BP: 155/59 123/66  Pulse: 93 94  Temp: 98.7 F (37.1 C) 99.2 F (37.3 C)  Resp: 17 16   General: Appear in no distress, no Rash; Oral Mucosa moist. Cardiovascular: S1 and S2 Present, no Murmur, no JVD Respiratory: Bilateral Air entry present and Clear to Auscultation, no Crackles, no wheezes Abdomen: Bowel Sound present, Soft and no tenderness Extremities: bilateral Pedal edema, no calf tenderness Neurology: Grossly no focal neuro deficit.  The results of significant diagnostics from this hospitalization (including imaging, microbiology, ancillary and laboratory) are listed below for reference.    Significant Diagnostic  Studies: Dg Chest 2 View  02/04/2016  CLINICAL DATA:  Patient with right leg pain and swelling. No chest complaints. EXAM: CHEST  2 VIEW COMPARISON:  Chest radiograph 06/06/2014. FINDINGS: Stable cardiac and mediastinal contours. Unchanged right peritracheal mass. No consolidative pulmonary opacities. No pleural effusion or pneumothorax. Bilateral shoulder joint degenerative changes. Thoracic spine degenerative changes. IMPRESSION: No acute cardiopulmonary process. Electronically Signed   By: Lovey Newcomer M.D.   On: 02/04/2016 15:26   Dg Elbow Complete Left  02/04/2016  CLINICAL DATA:  Left elbow pain after a fall today. EXAM: LEFT ELBOW - COMPLETE 3+ VIEW COMPARISON:  None. FINDINGS: Longitudinal fracture along the medial margin of the proximal ulna extending to the articular surface with involvement of the adjacent coronoid process. Large elbow joint effusion. Spurring of the lateral humeral epicondyle. IMPRESSION: 1. Proximal longitudinal ulnar fracture involving  the medial articular margin and the medial coronoid process. Large elbow joint effusion. Electronically Signed   By: Van Clines M.D.   On: 02/04/2016 15:32   Dg Wrist 2 Views Left  02/04/2016  CLINICAL DATA:  70 year old female with history of left wrist fracture status postreduction. EXAM: LEFT WRIST - 2 VIEW COMPARISON:  02/04/2016. FINDINGS: Previously noted comminuted fracture of the distal left radius has been reduced, with restoration of near anatomic alignment. Overlying plaster cast obscures finer bony detail. Nondisplaced ulnar styloid avulsion fracture seen on the prior study is obscured. Carpals otherwise appear intact. IMPRESSION: 1. Status post close reduction and cast fixation of distal radial fracture with improved near anatomic alignment, as above. Electronically Signed   By: Vinnie Langton M.D.   On: 02/04/2016 18:01   Dg Wrist Complete Left  02/04/2016  CLINICAL DATA:  Left wrist deformity with swelling after fall.  EXAM: LEFT WRIST - COMPLETE 3+ VIEW COMPARISON:  None. FINDINGS: Fracture of the distal radius with posterior impaction and apex anterior angulation. Distal articular surface extension in the radius is noted medially. Widened scapholunate interval on the dedicated scaphoid view, suggesting possible scapholunate ligament tear. Scapholunate angle on the lateral projection normal. Subtle periosteal reaction along the base of the ulnar styloid, suspicious for ulnar styloid fracture, nondisplaced. IMPRESSION: 1. Distal radial fracture with transverse metaphyseal component and a component extending to the distal radial articular surface medially. 2. Nondisplaced ulnar styloid fracture. 3. Potential tear of the scapholunate ligament. Electronically Signed   By: Van Clines M.D.   On: 02/04/2016 15:46   Ct Elbow Left Wo Contrast  02/04/2016  CLINICAL DATA:  Known proximal ulnar fracture EXAM: CT OF THE LEFT ELBOW WITHOUT CONTRAST TECHNIQUE: Multidetector CT imaging was performed according to the standard protocol. Multiplanar CT image reconstructions were also generated. COMPARISON:  Plain film from earlier in the same day. FINDINGS: Distal humerus is within normal limits. The visualized portion of the radius is unremarkable. There is again noted a fracture through the medial aspect of the articular surface of the proximal ulna similar to that seen on the prior plain film examination. The fracture fragment does not appear to be significantly displaced. There is elevation of the anterior and posterior fat pad similar to that seen on the prior exam consistent with joint effusion. No other soft tissue abnormality is noted. IMPRESSION: Proximal ulnar fracture is again identified and stable when compared with the prior exam. The fracture fragment is non displaced. Joint effusion is noted. Electronically Signed   By: Inez Catalina M.D.   On: 02/04/2016 21:29    Microbiology: No results found for this or any previous  visit (from the past 240 hour(s)).   Labs: CBC:  Recent Labs Lab 02/04/16 1409  WBC 4.6  NEUTROABS 3.0  HGB 10.0*  HCT 33.7*  MCV 73.9*  PLT 123456   Basic Metabolic Panel:  Recent Labs Lab 02/04/16 1409  NA 139  K 3.7  CL 106  CO2 26  GLUCOSE 85  BUN 9  CREATININE 0.73  CALCIUM 9.2   Liver Function Tests:  Recent Labs Lab 02/04/16 1409  AST 18  ALT 12*  ALKPHOS 73  BILITOT 0.7  PROT 7.0  ALBUMIN 3.4*   No results for input(s): LIPASE, AMYLASE in the last 168 hours. No results for input(s): AMMONIA in the last 168 hours. Cardiac Enzymes: No results for input(s): CKTOTAL, CKMB, CKMBINDEX, TROPONINI in the last 168 hours. BNP (last 3 results)  Recent Labs  02/04/16 1409  BNP 23.7   CBG: No results for input(s): GLUCAP in the last 168 hours. Time spent: 30 minutes  Signed:  Berle Mull  [facility]  02/05/2016  , 12:40 PM

## 2016-02-05 NOTE — Progress Notes (Addendum)
Report gave to LPN, Natasha Mead at [facility]. Pt 's dressing changed per MD order ( dry dressing+ TED). Pt is pleasant and main is managed. He is ready to discharge.

## 2016-02-06 ENCOUNTER — Telehealth: Payer: Self-pay | Admitting: Family

## 2016-02-06 DIAGNOSIS — M7989 Other specified soft tissue disorders: Secondary | ICD-10-CM | POA: Insufficient documentation

## 2016-02-06 NOTE — Telephone Encounter (Signed)
Please inform patient that her circulation appears to be adequate with no peripheral artery disease. Her ultrasounds also show no evidence of blood clots. Therefore her symptoms most likely lymphedema which deals with the way fluids are moved around the body. We may need to send her to physical therapy for leg wraps to help with her symptoms if they continue.

## 2016-02-07 ENCOUNTER — Telehealth: Payer: Self-pay | Admitting: *Deleted

## 2016-02-07 NOTE — Telephone Encounter (Signed)
Tried calling pt to get her set-up for TCM hosp f/u # on file has been disconnected.Called daughter # on file [facility] to have pt contact office to make appt....Johny Chess

## 2016-02-08 DIAGNOSIS — E119 Type 2 diabetes mellitus without complications: Secondary | ICD-10-CM | POA: Diagnosis not present

## 2016-02-08 DIAGNOSIS — S52002D Unspecified fracture of upper end of left ulna, subsequent encounter for closed fracture with routine healing: Secondary | ICD-10-CM | POA: Diagnosis not present

## 2016-02-08 DIAGNOSIS — K219 Gastro-esophageal reflux disease without esophagitis: Secondary | ICD-10-CM | POA: Diagnosis not present

## 2016-02-08 DIAGNOSIS — I10 Essential (primary) hypertension: Secondary | ICD-10-CM | POA: Diagnosis not present

## 2016-02-09 NOTE — Telephone Encounter (Signed)
Was unable to reach pt by phone. The number is not in service. Sending all results in the mail.

## 2016-02-20 ENCOUNTER — Inpatient Hospital Stay: Payer: Medicare Other | Admitting: Family

## 2016-02-20 DIAGNOSIS — Z0289 Encounter for other administrative examinations: Secondary | ICD-10-CM

## 2016-02-26 DIAGNOSIS — S52572A Other intraarticular fracture of lower end of left radius, initial encounter for closed fracture: Secondary | ICD-10-CM | POA: Diagnosis not present

## 2016-02-26 DIAGNOSIS — S52002A Unspecified fracture of upper end of left ulna, initial encounter for closed fracture: Secondary | ICD-10-CM | POA: Diagnosis not present

## 2016-02-28 ENCOUNTER — Encounter (HOSPITAL_COMMUNITY): Payer: Self-pay | Admitting: *Deleted

## 2016-02-28 NOTE — Progress Notes (Signed)
Pt SDW-pre-op call completed by both pt and her friend [place] with pt consent. Pt denies SOB, chest pain, and being under the care of a cardiologist. Pt denies having a cardiac cath and stress test. Pt denies having an A1c within the last 60 days. Pt made aware to stop taking Aspirin, vitamins, fish oil and herbal medications. Do not take any NSAIDs ie: Ibuprofen, Advil, Naproxen, BC and Goody Powder or any medication containing Aspirin. Alma took pt pre-op  instructions and verbalized understanding of all pre-op instructions.

## 2016-02-28 NOTE — Brief Op Note (Signed)
02/29/2016  8:31 PM  PATIENT:  [name]  70 y.o. female  PRE-OPERATIVE DIAGNOSIS:  LEFT DISTAL RADIUS MALUNION   POST-OPERATIVE DIAGNOSIS:  * No post-op diagnosis entered *  PROCEDURE:  Procedure(s): LEFT DISTAL RADIUS REPAIR OF MALUNION WITH OPEN REDUCTION INTERNAL FIXATION (ORIF) (Left)  SURGEON:  Surgeon(s) and Role:    * Iran Planas, MD - Primary  PHYSICIAN ASSISTANT:   ASSISTANTS: none   ANESTHESIA:   general  EBL:     BLOOD ADMINISTERED:none  DRAINS: none   LOCAL MEDICATIONS USED:  NONE  SPECIMEN:  No Specimen  DISPOSITION OF SPECIMEN:  N/A  COUNTS:  YES  TOURNIQUET:  * No tourniquets in log *  DICTATION: .Other Dictation: Dictation Number [id number]  PLAN OF CARE: Admit for overnight observation  PATIENT DISPOSITION:  PACU - hemodynamically stable.   Delay start of Pharmacological VTE agent (>24hrs) due to surgical blood loss or risk of bleeding: not applicable

## 2016-02-28 NOTE — H&P (Signed)
[name] is an 70 y.o. female.   Chief Complaint: Left distal radius injury after fall HPI: Pt fell and landed on outstretched left wrist.  Pt seen/evaluated in office. Pt here for surgery. No prior surgery to left wrist.  Past Medical History  Diagnosis Date  . Hypertension   . Diabetes mellitus   . Chronic knee pain     right  . Polio   . Arthritis   . Distal radius fracture, left     Past Surgical History  Procedure Laterality Date  . Abdominal hysterectomy      Family History  Problem Relation Age of Onset  . Diabetes Mellitus II Neg Hx   . Lupus Neg Hx   . Sarcoidosis Neg Hx   . Hypertension    . High Cholesterol    . Ovarian cancer Mother   . COPD Father    Social History:  reports that she quit smoking about 47 years ago. Her smoking use included Cigarettes. She smoked 0.50 packs per day. She has never used smokeless tobacco. She reports that she does not drink alcohol or use illicit drugs.  Allergies:  Allergies  Allergen Reactions  . Orange Concentrate [Flavoring Agent] Shortness Of Breath, Itching and Swelling  . Peach [Prunus Persica] Shortness Of Breath, Itching and Swelling  . Penicillins Anaphylaxis and Hives    All 'cillins' Has patient had a PCN reaction causing immediate rash, facial/tongue/throat swelling, SOB or lightheadedness with hypotension: Yes Has patient had a PCN reaction causing severe rash involving mucus membranes or skin necrosis: No Has patient had a PCN reaction that required hospitalization No Has patient had a PCN reaction occurring within the last 10 years: No If all of the above answers are "NO", then may proceed with Cephalosporin use.   . Strawberry Extract Shortness Of Breath, Itching and Swelling  . Adhesive [Tape] Itching and Rash    Please use "paper" tape  . Aspirin Itching  . Latex Itching and Rash  . Pineapple Itching and Rash  . Strawberry Flavor Itching and Rash    No prescriptions prior to admission     No results found for this or any previous visit (from the past 48 hour(s)). No results found.  ROS NO RECENT ILLNESSES OR HOSPITALIZATIONS  There were no vitals taken for this visit. Physical Exam  General Appearance:  Alert, cooperative, no distress, appears stated age  Head:  Normocephalic, without obvious abnormality, atraumatic  Eyes:  Pupils equal, conjunctiva/corneas clear,         Throat: Lips, mucosa, and tongue normal; teeth and gums normal  Neck: No visible masses     Lungs:   respirations unlabored  Chest Wall:  No tenderness or deformity  Heart:  Regular rate and rhythm,  Abdomen:   Soft, non-tender,         Extremities: LUE: SKIN INTACT, MODERATE SWELLING DORSALLY ABLE TO EXTEND THUMB IP JOINT ABLE TO FLEX THUMB IP JOINT FINGERS WARM WELL PERFUSED  Pulses: 2+ and symmetric  Skin: Skin color, texture, turgor normal, no rashes or lesions     Neurologic: Normal    Assessment/Plan LEFT DISTAL RADIUS NASCENT MALUNION  LEFT DISTAL RADIUS OPEN REDUCTION AND REPAIR AS INDICATED  R/B/A DISCUSSED WITH PT IN OFFICE.  PT VOICED UNDERSTANDING OF PLAN CONSENT SIGNED DAY OF SURGERY PT SEEN AND EXAMINED PRIOR TO OPERATIVE PROCEDURE/DAY OF SURGERY SITE MARKED. QUESTIONS ANSWERED WILL REMAIN OBSERVATION FOLLOWING SURGERY  WE ARE PLANNING SURGERY FOR YOUR UPPER EXTREMITY. THE RISKS  AND BENEFITS OF SURGERY INCLUDE BUT NOT LIMITED TO BLEEDING INFECTION, DAMAGE TO NEARBY NERVES ARTERIES TENDONS, FAILURE OF SURGERY TO ACCOMPLISH ITS INTENDED GOALS, PERSISTENT SYMPTOMS AND NEED FOR FURTHER SURGICAL INTERVENTION. WITH THIS IN MIND WE WILL PROCEED. I HAVE DISCUSSED WITH THE PATIENT THE PRE AND POSTOPERATIVE REGIMEN AND THE DOS AND DON'TS. PT VOICED UNDERSTANDING AND INFORMED CONSENT SIGNED.  [name] 02/29/2016 @1320

## 2016-02-29 ENCOUNTER — Ambulatory Visit (HOSPITAL_COMMUNITY): Payer: Medicare Other | Admitting: Certified Registered Nurse Anesthetist

## 2016-02-29 ENCOUNTER — Encounter (HOSPITAL_COMMUNITY): Payer: Self-pay | Admitting: *Deleted

## 2016-02-29 ENCOUNTER — Ambulatory Visit (HOSPITAL_COMMUNITY)
Admission: RE | Admit: 2016-02-29 | Discharge: 2016-03-05 | Disposition: A | Discharge status: Home / Self Care | Payer: Medicare Other | Source: Ambulatory Visit | Attending: Orthopedic Surgery | Admitting: Orthopedic Surgery

## 2016-02-29 ENCOUNTER — Encounter (HOSPITAL_COMMUNITY)
Admission: RE | Disposition: A | Discharge status: Home / Self Care | Payer: Self-pay | Source: Ambulatory Visit | Attending: Orthopedic Surgery

## 2016-02-29 DIAGNOSIS — R531 Weakness: Secondary | ICD-10-CM | POA: Diagnosis not present

## 2016-02-29 DIAGNOSIS — Z880 Allergy status to penicillin: Secondary | ICD-10-CM | POA: Diagnosis not present

## 2016-02-29 DIAGNOSIS — G8918 Other acute postprocedural pain: Secondary | ICD-10-CM | POA: Diagnosis not present

## 2016-02-29 DIAGNOSIS — S52502P Unspecified fracture of the lower end of left radius, subsequent encounter for closed fracture with malunion: Secondary | ICD-10-CM | POA: Diagnosis not present

## 2016-02-29 DIAGNOSIS — E119 Type 2 diabetes mellitus without complications: Secondary | ICD-10-CM | POA: Diagnosis not present

## 2016-02-29 DIAGNOSIS — X58XXXA Exposure to other specified factors, initial encounter: Secondary | ICD-10-CM | POA: Diagnosis not present

## 2016-02-29 DIAGNOSIS — S52502A Unspecified fracture of the lower end of left radius, initial encounter for closed fracture: Secondary | ICD-10-CM | POA: Diagnosis present

## 2016-02-29 DIAGNOSIS — I10 Essential (primary) hypertension: Secondary | ICD-10-CM | POA: Diagnosis not present

## 2016-02-29 DIAGNOSIS — M1990 Unspecified osteoarthritis, unspecified site: Secondary | ICD-10-CM | POA: Diagnosis not present

## 2016-02-29 DIAGNOSIS — G8929 Other chronic pain: Secondary | ICD-10-CM | POA: Insufficient documentation

## 2016-02-29 DIAGNOSIS — Z87891 Personal history of nicotine dependence: Secondary | ICD-10-CM | POA: Insufficient documentation

## 2016-02-29 DIAGNOSIS — M25561 Pain in right knee: Secondary | ICD-10-CM | POA: Insufficient documentation

## 2016-02-29 HISTORY — PX: OPEN REDUCTION INTERNAL FIXATION (ORIF) DISTAL RADIAL FRACTURE: SHX5989

## 2016-02-29 LAB — GLUCOSE, CAPILLARY
GLUCOSE-CAPILLARY: 72 mg/dL (ref 65–99)
GLUCOSE-CAPILLARY: 66 mg/dL (ref 65–99)
GLUCOSE-CAPILLARY: 94 mg/dL (ref 65–99)
GLUCOSE-CAPILLARY: 97 mg/dL (ref 65–99)
GLUCOSE-CAPILLARY: 150 mg/dL — AB (ref 65–99)
Glucose-Capillary: 54 mg/dL — ABNORMAL LOW (ref 65–99)
Glucose-Capillary: 80 mg/dL (ref 65–99)
Glucose-Capillary: 67 mg/dL (ref 65–99)

## 2016-02-29 LAB — CBC
HEMATOCRIT: 31.5 % — AB (ref 36.0–46.0)
Hemoglobin: 9.4 g/dL — ABNORMAL LOW (ref 12.0–15.0)
MCH: 22 pg — ABNORMAL LOW (ref 26.0–34.0)
MCHC: 29.8 g/dL — AB (ref 30.0–36.0)
MCV: 73.8 fL — ABNORMAL LOW (ref 78.0–100.0)
Platelets: 198 10*3/uL (ref 150–400)
RBC: 4.27 MIL/uL (ref 3.87–5.11)
RDW: 15.1 % (ref 11.5–15.5)
WBC: 4.9 10*3/uL (ref 4.0–10.5)

## 2016-02-29 LAB — BASIC METABOLIC PANEL
Anion gap: 7 (ref 5–15)
BUN: 11 mg/dL (ref 6–20)
CALCIUM: 9.2 mg/dL (ref 8.9–10.3)
CO2: 25 mmol/L (ref 22–32)
CREATININE: 0.6 mg/dL (ref 0.44–1.00)
Chloride: 109 mmol/L (ref 101–111)
GFR calc non Af Amer: >60 mL/min (ref >60)
GLUCOSE: 73 mg/dL (ref 65–99)
Potassium: 3.9 mmol/L (ref 3.5–5.1)
Sodium: 141 mmol/L (ref 135–145)

## 2016-02-29 SURGERY — OPEN REDUCTION INTERNAL FIXATION (ORIF) DISTAL RADIUS FRACTURE
Anesthesia: General | Site: Arm Lower | Laterality: Left

## 2016-02-29 MED ORDER — FERROUS SULFATE 325 (65 FE) MG PO TABS
325.0000 mg | ORAL_TABLET | Freq: Every day | ORAL | Status: DC
  Administered 2016-02-29 – 2016-03-05 (×6): 325 mg via ORAL
  Filled 2016-02-29 (×6): qty 1

## 2016-02-29 MED ORDER — FENTANYL CITRATE (PF) 250 MCG/5ML IJ SOLN
INTRAMUSCULAR | Status: AC
  Filled 2016-02-29: qty 5

## 2016-02-29 MED ORDER — PROPOFOL 10 MG/ML IV BOLUS
INTRAVENOUS | Status: DC | PRN
  Administered 2016-02-29: 100 mg via INTRAVENOUS
  Administered 2016-02-29: 50 mg via INTRAVENOUS

## 2016-02-29 MED ORDER — VITAMIN C 500 MG PO TABS: 500.0000 mg | ORAL_TABLET | Freq: Every day | ORAL | Status: DC

## 2016-02-29 MED ORDER — PHENYLEPHRINE 40 MCG/ML (10ML) SYRINGE FOR IV PUSH (FOR BLOOD PRESSURE SUPPORT)
PREFILLED_SYRINGE | INTRAVENOUS | Status: AC
  Filled 2016-02-29: qty 10

## 2016-02-29 MED ORDER — PHENYLEPHRINE HCL 10 MG/ML IJ SOLN
10.0000 mg | INTRAMUSCULAR | Status: DC | PRN
  Administered 2016-02-29: 50 ug/min via INTRAVENOUS

## 2016-02-29 MED ORDER — ONDANSETRON HCL 4 MG/2ML IJ SOLN
INTRAMUSCULAR | Status: DC | PRN
  Administered 2016-02-29: 4 mg via INTRAVENOUS

## 2016-02-29 MED ORDER — ONDANSETRON HCL 4 MG PO TABS: 4.0000 mg | ORAL_TABLET | Freq: Four times a day (QID) | ORAL | Status: DC | PRN

## 2016-02-29 MED ORDER — DOCUSATE SODIUM 100 MG PO CAPS: 100.0000 mg | ORAL_CAPSULE | Freq: Two times a day (BID) | ORAL | Status: DC

## 2016-02-29 MED ORDER — MORPHINE SULFATE (PF) 2 MG/ML IV SOLN: 1.0000 mg | INTRAVENOUS | Status: DC | PRN

## 2016-02-29 MED ORDER — ACETAMINOPHEN 650 MG RE SUPP
650.0000 mg | Freq: Four times a day (QID) | RECTAL | Status: DC | PRN
Start: 2016-02-29 — End: 2016-03-05

## 2016-02-29 MED ORDER — HYDROCODONE-ACETAMINOPHEN 5-325 MG PO TABS
1.0000 | ORAL_TABLET | Freq: Three times a day (TID) | ORAL | Status: DC | PRN
  Administered 2016-03-03 – 2016-03-04 (×3): 1 via ORAL
  Filled 2016-02-29 (×3): qty 1

## 2016-02-29 MED ORDER — SUCCINYLCHOLINE CHLORIDE 20 MG/ML IJ SOLN
INTRAMUSCULAR | Status: DC | PRN
  Administered 2016-02-29: 100 mg via INTRAVENOUS

## 2016-02-29 MED ORDER — DOCUSATE SODIUM 100 MG PO CAPS
100.0000 mg | ORAL_CAPSULE | Freq: Two times a day (BID) | ORAL | Status: DC
  Administered 2016-02-29 – 2016-03-05 (×11): 100 mg via ORAL
  Filled 2016-02-29 (×8): qty 1

## 2016-02-29 MED ORDER — DEXTROSE 5 % IV SOLN
500.0000 mg | Freq: Four times a day (QID) | INTRAVENOUS | Status: DC | PRN
  Filled 2016-02-29: qty 5

## 2016-02-29 MED ORDER — FENTANYL CITRATE (PF) 100 MCG/2ML IJ SOLN
INTRAMUSCULAR | Status: AC
  Administered 2016-02-29: 50 ug
  Filled 2016-02-29: qty 2

## 2016-02-29 MED ORDER — CLINDAMYCIN PHOSPHATE 600 MG/50ML IV SOLN
600.0000 mg | Freq: Four times a day (QID) | INTRAVENOUS | Status: AC
  Administered 2016-02-29 – 2016-03-01 (×3): 600 mg via INTRAVENOUS
  Filled 2016-02-29 (×3): qty 50

## 2016-02-29 MED ORDER — CLINDAMYCIN PHOSPHATE 900 MG/50ML IV SOLN
INTRAVENOUS | Status: AC
  Administered 2016-02-29: 900 mg via INTRAVENOUS
  Filled 2016-02-29: qty 50

## 2016-02-29 MED ORDER — METOCLOPRAMIDE HCL 5 MG/ML IJ SOLN: 5.0000 mg | Freq: Three times a day (TID) | INTRAMUSCULAR | Status: DC | PRN

## 2016-02-29 MED ORDER — MIDAZOLAM HCL 2 MG/2ML IJ SOLN
INTRAMUSCULAR | Status: AC
  Administered 2016-02-29: 1 mg
  Filled 2016-02-29: qty 2

## 2016-02-29 MED ORDER — 0.9 % SODIUM CHLORIDE (POUR BTL) OPTIME
TOPICAL | Status: DC | PRN
  Administered 2016-02-29: 1000 mL

## 2016-02-29 MED ORDER — PROPOFOL 10 MG/ML IV BOLUS
INTRAVENOUS | Status: AC
  Filled 2016-02-29: qty 40

## 2016-02-29 MED ORDER — PROPOFOL 10 MG/ML IV BOLUS
INTRAVENOUS | Status: AC
  Filled 2016-02-29: qty 20

## 2016-02-29 MED ORDER — OXYCODONE HCL 5 MG PO TABS
ORAL_TABLET | ORAL | Status: AC
  Filled 2016-02-29: qty 2

## 2016-02-29 MED ORDER — METHOCARBAMOL 500 MG PO TABS: 500.0000 mg | ORAL_TABLET | Freq: Four times a day (QID) | ORAL | Status: DC

## 2016-02-29 MED ORDER — DEXAMETHASONE SODIUM PHOSPHATE 10 MG/ML IJ SOLN
INTRAMUSCULAR | Status: AC
  Filled 2016-02-29: qty 1

## 2016-02-29 MED ORDER — BUPIVACAINE HCL (PF) 0.25 % IJ SOLN
INTRAMUSCULAR | Status: AC
  Filled 2016-02-29: qty 30

## 2016-02-29 MED ORDER — LIDOCAINE HCL (CARDIAC) 20 MG/ML IV SOLN
INTRAVENOUS | Status: DC | PRN
  Administered 2016-02-29: 10 mg via INTRAVENOUS

## 2016-02-29 MED ORDER — METHOCARBAMOL 500 MG PO TABS
ORAL_TABLET | ORAL | Status: AC
  Filled 2016-02-29: qty 1

## 2016-02-29 MED ORDER — DEXTROSE 50 % IV SOLN
INTRAVENOUS | Status: AC
  Filled 2016-02-29: qty 50

## 2016-02-29 MED ORDER — BISACODYL 5 MG PO TBEC: 5.0000 mg | DELAYED_RELEASE_TABLET | Freq: Every day | ORAL | Status: DC | PRN

## 2016-02-29 MED ORDER — METHOCARBAMOL 500 MG PO TABS
500.0000 mg | ORAL_TABLET | Freq: Four times a day (QID) | ORAL | Status: DC | PRN
  Administered 2016-02-29 – 2016-03-04 (×6): 500 mg via ORAL
  Filled 2016-02-29 (×5): qty 1

## 2016-02-29 MED ORDER — METOCLOPRAMIDE HCL 5 MG PO TABS: 5.0000 mg | ORAL_TABLET | Freq: Three times a day (TID) | ORAL | Status: DC | PRN

## 2016-02-29 MED ORDER — ACETAMINOPHEN 325 MG PO TABS
650.0000 mg | ORAL_TABLET | Freq: Four times a day (QID) | ORAL | Status: DC | PRN
  Administered 2016-03-02 – 2016-03-04 (×3): 650 mg via ORAL
  Filled 2016-02-29 (×3): qty 2

## 2016-02-29 MED ORDER — PHENYLEPHRINE HCL 10 MG/ML IJ SOLN
INTRAMUSCULAR | Status: DC | PRN
  Administered 2016-02-29 (×5): 80 ug via INTRAVENOUS

## 2016-02-29 MED ORDER — OXYCODONE HCL 5 MG PO TABS
5.0000 mg | ORAL_TABLET | ORAL | Status: DC | PRN
  Administered 2016-02-29 – 2016-03-05 (×8): 10 mg via ORAL
  Filled 2016-02-29 (×7): qty 2

## 2016-02-29 MED ORDER — BUPIVACAINE-EPINEPHRINE (PF) 0.5% -1:200000 IJ SOLN
INTRAMUSCULAR | Status: DC | PRN
  Administered 2016-02-29: 22 mL via PERINEURAL

## 2016-02-29 MED ORDER — DIPHENHYDRAMINE HCL 12.5 MG/5ML PO ELIX: 12.5000 mg | ORAL_SOLUTION | ORAL | Status: DC | PRN

## 2016-02-29 MED ORDER — CHLORHEXIDINE GLUCONATE 4 % EX LIQD
60.0000 mL | Freq: Once | CUTANEOUS | Status: DC
Start: 2016-02-29 — End: 2016-02-29

## 2016-02-29 MED ORDER — CLINDAMYCIN PHOSPHATE 900 MG/50ML IV SOLN: 900.0000 mg | INTRAVENOUS | Status: DC

## 2016-02-29 MED ORDER — OXYCODONE-ACETAMINOPHEN 5-325 MG PO TABS: 1.0000 | ORAL_TABLET | ORAL | Status: DC | PRN

## 2016-02-29 MED ORDER — ONDANSETRON HCL 4 MG/2ML IJ SOLN
INTRAMUSCULAR | Status: AC
  Filled 2016-02-29: qty 2

## 2016-02-29 MED ORDER — FUROSEMIDE 20 MG PO TABS
20.0000 mg | ORAL_TABLET | Freq: Every day | ORAL | Status: DC
  Administered 2016-02-29 – 2016-03-05 (×6): 20 mg via ORAL
  Filled 2016-02-29 (×6): qty 1

## 2016-02-29 MED ORDER — FENTANYL CITRATE (PF) 100 MCG/2ML IJ SOLN
25.0000 ug | Freq: Once | INTRAMUSCULAR | Status: AC
  Administered 2016-02-29: 25 ug via INTRAVENOUS

## 2016-02-29 MED ORDER — ONDANSETRON HCL 4 MG/2ML IJ SOLN: 4.0000 mg | Freq: Four times a day (QID) | INTRAMUSCULAR | Status: DC | PRN

## 2016-02-29 MED ORDER — LACTATED RINGERS IV SOLN
INTRAVENOUS | Status: DC
  Administered 2016-02-29 (×3): via INTRAVENOUS

## 2016-02-29 MED ORDER — FENTANYL CITRATE (PF) 100 MCG/2ML IJ SOLN
INTRAMUSCULAR | Status: DC | PRN
  Administered 2016-02-29: 100 ug via INTRAVENOUS

## 2016-02-29 SURGICAL SUPPLY — 66 items
BANDAGE ACE 4X5 VEL STRL LF (GAUZE/BANDAGES/DRESSINGS) ×2 IMPLANT
BANDAGE ELASTIC 3 VELCRO ST LF (GAUZE/BANDAGES/DRESSINGS) ×3 IMPLANT
BANDAGE ELASTIC 4 VELCRO ST LF (GAUZE/BANDAGES/DRESSINGS) ×3 IMPLANT
BIT DRILL 2.2 SS TIBIAL (BIT) ×2 IMPLANT
BLADE SURG ROTATE 9660 (MISCELLANEOUS) IMPLANT
BNDG CMPR 9X4 STRL LF SNTH (GAUZE/BANDAGES/DRESSINGS) ×1
BNDG ESMARK 4X9 LF (GAUZE/BANDAGES/DRESSINGS) ×3 IMPLANT
BNDG GAUZE ELAST 4 BULKY (GAUZE/BANDAGES/DRESSINGS) ×3 IMPLANT
CANISTER SUCTION 2500CC (MISCELLANEOUS) ×3 IMPLANT
CORDS BIPOLAR (ELECTRODE) ×3 IMPLANT
COVER SURGICAL LIGHT HANDLE (MISCELLANEOUS) ×3 IMPLANT
CUFF TOURNIQUET SINGLE 18IN (TOURNIQUET CUFF) ×3 IMPLANT
CUFF TOURNIQUET SINGLE 24IN (TOURNIQUET CUFF) IMPLANT
DECANTER SPIKE VIAL GLASS SM (MISCELLANEOUS) ×1 IMPLANT
DRAPE OEC MINIVIEW 54X84 (DRAPES) ×3 IMPLANT
DRAPE SURG 17X11 SM STRL (DRAPES) ×3 IMPLANT
DRSG ADAPTIC 3X8 NADH LF (GAUZE/BANDAGES/DRESSINGS) ×3 IMPLANT
GAUZE SPONGE 4X4 12PLY STRL (GAUZE/BANDAGES/DRESSINGS) ×3 IMPLANT
GAUZE SPONGE 4X4 16PLY XRAY LF (GAUZE/BANDAGES/DRESSINGS) ×3 IMPLANT
GLOVE BIOGEL PI IND STRL 8.5 (GLOVE) ×1 IMPLANT
GLOVE BIOGEL PI INDICATOR 8.5 (GLOVE) ×2
GLOVE SURG ORTHO 8.0 STRL STRW (GLOVE) ×3 IMPLANT
GOWN STRL REUS W/ TWL LRG LVL3 (GOWN DISPOSABLE) ×1 IMPLANT
GOWN STRL REUS W/ TWL XL LVL3 (GOWN DISPOSABLE) ×1 IMPLANT
GOWN STRL REUS W/TWL LRG LVL3 (GOWN DISPOSABLE) ×3
GOWN STRL REUS W/TWL XL LVL3 (GOWN DISPOSABLE) ×3
K-WIRE 1.6 (WIRE) ×3
K-WIRE FX5X1.6XNS BN SS (WIRE) ×1
KIT BASIN OR (CUSTOM PROCEDURE TRAY) ×3 IMPLANT
KIT ROOM TURNOVER OR (KITS) ×3 IMPLANT
KWIRE FX5X1.6XNS BN SS (WIRE) IMPLANT
NDL HYPO 25X1 1.5 SAFETY (NEEDLE) ×1 IMPLANT
NEEDLE HYPO 25X1 1.5 SAFETY (NEEDLE) ×3 IMPLANT
NS IRRIG 1000ML POUR BTL (IV SOLUTION) ×3 IMPLANT
PACK ORTHO EXTREMITY (CUSTOM PROCEDURE TRAY) ×3 IMPLANT
PAD ARMBOARD 7.5X6 YLW CONV (MISCELLANEOUS) ×6 IMPLANT
PAD CAST 4YDX4 CTTN HI CHSV (CAST SUPPLIES) ×1 IMPLANT
PADDING CAST ABS 4INX4YD NS (CAST SUPPLIES) ×2
PADDING CAST ABS COTTON 4X4 ST (CAST SUPPLIES) IMPLANT
PADDING CAST COTTON 4X4 STRL (CAST SUPPLIES) ×3
PEG LOCKING SMOOTH 2.2X16 (Screw) ×2 IMPLANT
PEG LOCKING SMOOTH 2.2X18 (Peg) ×2 IMPLANT
PEG LOCKING SMOOTH 2.2X22 (Screw) ×4 IMPLANT
PLATE STD DVR LEFT (Plate) ×3 IMPLANT
PLATE STD DVR LT 24X55 (Plate) IMPLANT
PUTTY DBM STAGRAFT PLUS 5CC (Putty) ×2 IMPLANT
SCREW LOCK 14X2.7X 3 LD TPR (Screw) IMPLANT
SCREW LOCK 18X2.7X 3 LD TPR (Screw) IMPLANT
SCREW LOCK 24X2.7X3 LD THRD (Screw) IMPLANT
SCREW LOCKING 2.7X14 (Screw) ×12 IMPLANT
SCREW LOCKING 2.7X15MM (Screw) ×2 IMPLANT
SCREW LOCKING 2.7X18 (Screw) ×3 IMPLANT
SCREW LOCKING 2.7X24MM (Screw) ×6 IMPLANT
SOAP 2 % CHG 4 OZ (WOUND CARE) ×3 IMPLANT
SPONGE GAUZE 4X4 12PLY STER LF (GAUZE/BANDAGES/DRESSINGS) ×2 IMPLANT
SPONGE LAP 4X18 X RAY DECT (DISPOSABLE) ×3 IMPLANT
SUT VIC AB 2-0 FS1 27 (SUTURE) IMPLANT
SUT VICRYL 4-0 PS2 18IN ABS (SUTURE) ×2 IMPLANT
SUT VICRYL RAPIDE 4/0 PS 2 (SUTURE) ×4 IMPLANT
SYR CONTROL 10ML LL (SYRINGE) ×2 IMPLANT
TOWEL OR 17X24 6PK STRL BLUE (TOWEL DISPOSABLE) ×3 IMPLANT
TOWEL OR 17X26 10 PK STRL BLUE (TOWEL DISPOSABLE) ×3 IMPLANT
TUBE CONNECTING 12'X1/4 (SUCTIONS) ×1
TUBE CONNECTING 12X1/4 (SUCTIONS) ×2 IMPLANT
WATER STERILE IRR 1000ML POUR (IV SOLUTION) ×1 IMPLANT
YANKAUER SUCT BULB TIP NO VENT (SUCTIONS) IMPLANT

## 2016-02-29 NOTE — Transfer of Care (Signed)
Immediate Anesthesia Transfer of Care Note  [name]  Procedure(s) Performed: Procedure(s): LEFT DISTAL RADIUS REPAIR OF MALUNION WITH OPEN REDUCTION INTERNAL FIXATION (ORIF) (Left)  Patient Location: PACU  Anesthesia Type:GA combined with regional for post-op pain  Level of Consciousness: awake and alert   Airway & Oxygen Therapy: Patient Spontanous Breathing and Patient connected to face mask oxygen  Post-op Assessment: Report given to RN and Post -op Vital signs reviewed and stable  Post vital signs: Reviewed and stable  Last Vitals:  Filed Vitals:   02/29/16 1315 02/29/16 1320  BP: 110/59   Pulse: 61 77  Temp:    Resp: 12 18    Last Pain:  Filed Vitals:   02/29/16 1323  PainSc: 8       Patients Stated Pain Goal: 3 ([id number])  Complications: No apparent anesthesia complications

## 2016-02-29 NOTE — Anesthesia Preprocedure Evaluation (Addendum)
Anesthesia Evaluation  Patient identified by MRN, date of birth, ID band Patient awake    Reviewed: Allergy & Precautions, NPO status , Patient's Chart, lab work & pertinent test results  History of Anesthesia Complications Negative for: history of anesthetic complications  Airway Mallampati: II  TM Distance: >3 FB Neck ROM: Full   Comment: Large r neck mass Dental  (+) Teeth Intact, Dental Advisory Given   Pulmonary former smoker,    breath sounds clear to auscultation       Cardiovascular hypertension,  Rhythm:Regular Rate:Normal     Neuro/Psych negative neurological ROS     GI/Hepatic   Endo/Other  diabetes  Renal/GU      Musculoskeletal  (+) Arthritis ,   Abdominal   Peds  Hematology   Anesthesia Other Findings   Reproductive/Obstetrics                            Anesthesia Physical Anesthesia Plan  ASA: II  Anesthesia Plan:    Post-op Pain Management: GA combined w/ Regional for post-op pain   Induction: Intravenous  Airway Management Planned: LMA  Additional Equipment:   Intra-op Plan:   Post-operative Plan: Extubation in OR  Informed Consent: I have reviewed the patients History and Physical, chart, labs and discussed the procedure including the risks, benefits and alternatives for the proposed anesthesia with the patient or authorized representative who has indicated his/her understanding and acceptance.   Dental advisory given  Plan Discussed with: CRNA and Surgeon  Anesthesia Plan Comments:         Anesthesia Quick Evaluation

## 2016-02-29 NOTE — Progress Notes (Signed)
CBG=54, 15 ml IV D50 given per Dr. Orene Desanctis. Pt continues to deny symptoms of hypoglycemia, will monitor.

## 2016-02-29 NOTE — Discharge Instructions (Signed)
KEEP BANDAGE CLEAN AND DRY °CALL OFFICE FOR F/U APPT [phone] in 14 days °Dr Daphanie Oquendo cell [phone] °KEEP HAND ELEVATED ABOVE HEART °OK TO APPLY ICE TO OPERATIVE AREA °CONTACT OFFICE IF ANY WORSENING PAIN OR CONCERNS. °

## 2016-02-29 NOTE — Progress Notes (Signed)
Dr. Orene Desanctis aware that CBG 66, no new orders received, instructed to monitor CBG.

## 2016-02-29 NOTE — Anesthesia Postprocedure Evaluation (Signed)
Anesthesia Post Note  [name]  Procedure(s) Performed: Procedure(s) (LRB): LEFT DISTAL RADIUS REPAIR OF MALUNION WITH OPEN REDUCTION INTERNAL FIXATION (ORIF) (Left)  Patient location during evaluation: PACU Anesthesia Type: General Level of consciousness: sedated Pain management: pain level controlled Vital Signs Assessment: post-procedure vital signs reviewed and stable Respiratory status: spontaneous breathing and respiratory function stable Cardiovascular status: stable Anesthetic complications: no    Last Vitals:  Filed Vitals:   02/29/16 1530 02/29/16 1545  BP: 140/88 140/85  Pulse: 87 84  Temp:    Resp: 13 13    Last Pain:  Filed Vitals:   02/29/16 1604  PainSc: 0-No pain                 [name] DANIEL

## 2016-02-29 NOTE — Progress Notes (Signed)
  Hypoglycemic Event  CBG-67  Hypoglycemic protocol initiated, recheck blood sugar was 97. Dinner ordered for patient. Will continue to monitor patient's blood sugar closely.

## 2016-02-29 NOTE — Progress Notes (Signed)
Pt's family confirms they have dentures with belongings bag

## 2016-02-29 NOTE — Anesthesia Procedure Notes (Addendum)
Anesthesia Regional Block:  Supraclavicular block  Pre-Anesthetic Checklist: ,, timeout performed, Correct Patient, Correct Site, Correct Laterality, Correct Procedure, Correct Position, risks and benefits discussed, surgical consent, pre-op evaluation,  At surgeon's request and post-op pain management  Laterality: Upper  Prep: chloraprep       Needles:   Needle Type: Echogenic Stimulator Needle        Needle insertion depth: 4 cm   Additional Needles:  Procedures: ultrasound guided (picture in chart) and nerve stimulator Supraclavicular block Narrative:  Start time: 02/29/2016 1:12 PM End time: 02/29/2016 1:20 PM Injection made incrementally with aspirations every 5 mL.  Performed by: Personally  Anesthesiologist: MASSAGEE, TERRY  Additional Notes: Tolerated well   Procedure Name: Intubation Date/Time: 02/29/2016 1:37 PM Performed by: Christell Faith L Pre-anesthesia Checklist: Patient identified, Emergency Drugs available, Suction available, Patient being monitored and Timeout performed Patient Re-evaluated:Patient Re-evaluated prior to inductionOxygen Delivery Method: Circle system utilized Preoxygenation: Pre-oxygenation with 100% oxygen Intubation Type: IV induction Ventilation: Mask ventilation without difficulty Laryngoscope Size: Mac and 4 Grade View: Grade I Tube type: Oral Tube size: 7.5 mm Number of attempts: 1 Airway Equipment and Method: Stylet Placement Confirmation: ETT inserted through vocal cords under direct vision,  positive ETCO2,  CO2 detector and breath sounds checked- equal and bilateral Secured at: 20 cm Tube secured with: Tape Dental Injury: Teeth and Oropharynx as per pre-operative assessment  Comments: Unable to sit #4 LMA, decision to proceed with oral ETT. Grade 1 view and successful attempt x1.

## 2016-03-01 ENCOUNTER — Encounter (HOSPITAL_COMMUNITY): Payer: Self-pay | Admitting: Orthopedic Surgery

## 2016-03-01 DIAGNOSIS — Z87891 Personal history of nicotine dependence: Secondary | ICD-10-CM | POA: Diagnosis not present

## 2016-03-01 DIAGNOSIS — M1990 Unspecified osteoarthritis, unspecified site: Secondary | ICD-10-CM | POA: Diagnosis not present

## 2016-03-01 DIAGNOSIS — E119 Type 2 diabetes mellitus without complications: Secondary | ICD-10-CM | POA: Diagnosis not present

## 2016-03-01 DIAGNOSIS — M25561 Pain in right knee: Secondary | ICD-10-CM | POA: Diagnosis not present

## 2016-03-01 DIAGNOSIS — I10 Essential (primary) hypertension: Secondary | ICD-10-CM | POA: Diagnosis not present

## 2016-03-01 DIAGNOSIS — G8929 Other chronic pain: Secondary | ICD-10-CM | POA: Diagnosis not present

## 2016-03-01 DIAGNOSIS — Z880 Allergy status to penicillin: Secondary | ICD-10-CM | POA: Diagnosis not present

## 2016-03-01 DIAGNOSIS — R531 Weakness: Secondary | ICD-10-CM | POA: Diagnosis not present

## 2016-03-01 DIAGNOSIS — S52502A Unspecified fracture of the lower end of left radius, initial encounter for closed fracture: Secondary | ICD-10-CM | POA: Diagnosis not present

## 2016-03-01 LAB — GLUCOSE, CAPILLARY
GLUCOSE-CAPILLARY: 167 mg/dL — AB (ref 65–99)
Glucose-Capillary: 135 mg/dL — ABNORMAL HIGH (ref 65–99)
Glucose-Capillary: 125 mg/dL — ABNORMAL HIGH (ref 65–99)
Glucose-Capillary: 176 mg/dL — ABNORMAL HIGH (ref 65–99)

## 2016-03-01 LAB — HEMOGLOBIN A1C
Hgb A1c MFr Bld: 5.3 % (ref 4.8–5.6)
Mean Plasma Glucose: 105 mg/dL

## 2016-03-01 NOTE — Op Note (Signed)
NAMEMarland Kitchen  [name], [name] NO.:  [id number]  MEDICAL RECORD NO.:  [id number]  LOCATION:  [id number]                        FACILITY:  [facility]  PHYSICIAN:  [name] IV, M.D.DATE OF BIRTH:  1946/05/09  DATE OF PROCEDURE:  02/29/2016 DATE OF DISCHARGE:                              OPERATIVE REPORT   PREOPERATIVE DIAGNOSIS:  Left distal radius nascent malunion.  POSTOPERATIVE DIAGNOSIS:  Left distal radius nascent malunion.  ATTENDING PHYSICIAN:  [name], M.D., who scrubbed and present for the entire procedure.  ASSISTANT:  None.  ANESTHESIA:  General via LMA with supraclavicular block.  PROCEDURES: 1. Left wrist repair of nascent malunion, left distal radius with     allografting and internal fixation. 2. Left wrist brachioradialis tendon release and tenotomy. 3. Radiographs 3 views, left wrist.  SURGICAL IMPLANTS:  DVR Crosslock volar rim plate with several cc of Biomet and bone graft substitute.  SURGICAL INDICATIONS:  The patient is a 70 year old female who presented to the office with a nascent malunion, left distal radius.  The patient would like to undergo the above procedure.  Risks, benefits, and alternatives were discussed in detail with the patient.  Signed informed consent was obtained.  Risks include, but not limited to bleeding, infection, damage to nearby nerves, arteries, or tendons, loss of motion of wrist and digits, incomplete relief of symptoms, and need for further surgical intervention.  RADIOGRAPHIC INTERPRETATION:  AP and lateral views of the wrist did show the volar plate fixation in place in near anatomical alignment.  DESCRIPTION OF PROCEDURE:  The patient was properly identified in the preoperative holding area, marked with a permanent marker on the left wrist to indicate the correct operative site.  The patient was then brought back to the operating room and placed supine on the anesthesia table.  General anesthesia was  administered.  The patient tolerated this well.  A well-padded tourniquet placed on left brachium and sealed with 1000 drape. Left upper extremity was then prepped draped normal sterile fashion.  Time-out was called, correct side was identified, and procedure begun.  Attention then turned to the left wrist.  The limb was then elevated and tourniquet insufflated to 250 mmHg.  Longitudinal incision was made directly over the FCR sheath after the prep and drape. The FCR was then opened up proximally and distally.  After going through the floor of the FCR, the FPL was then swept out.  The pronator quadratus was then exposed through an L-shaped pronator quadratus flap was then carried out and the malunion was then exposed.  Careful release of the brachioradialis and tendons.  Tenotomy was then carried out of the brachioradialis in order to prepare the takedown of the malunion. Takedown of the malunion was then carefully done with small curettes rongeur and healing.  Taking down the malunion, the fracture site was then recreated with small free elevators and osteotomes.  After takedown on the malunion and dorsal, proximal segment was then pronated in order to free up the proximal segment.  The defect was then packed with allograft and an open reduction was then performed.  Repair the malunion was then carried out and the volar plate was then applied and  held distally with a K-wire and position confirmed using mini C-arm. Following this, a shaft screw was then placed.  Distal fixation was then carried out with a combination of locking and nonlocking screws.  Final screw fixation carried on the midshaft.  The wound was then thoroughly irrigated.  Final radiographs were then obtained.  The patient stress radiography was then carried out putting the rest under the stress, good near anatomical alignment without any penetration of the screws within the articular margin.  The deep fatty layer was then  closed with 3-0 Vicryl, subcutaneous tissues closed with 4-0 Vicryl skin closed with 4-0 Vicryl.  Adaptic dressing, sterile compressive bandage applied.  The patient was placed in a well-padded sugar-tong splint, extubated, and taken to the recovery room in good condition.  The patient was admitted overnight for IV antibiotics pain control, discharged in the morning, seen back in the office in approximately 2 weeks for wound check, suture removal, x-rays, application of short-arm cast for total 5 weeks, and then being a therapy regimen of 5-week mark. Radiographs at each visit.     Melrose Nakayama, M.D.     FWO/MEDQ  D:  02/29/2016  T:  02/29/2016  Job:  [id number]

## 2016-03-01 NOTE — Clinical Social Work Note (Signed)
Clinical Social Work Assessment  Patient Details  Name: [name] MRN: [id number] Date of Birth: 03-23-1946  Date of referral:  03/01/16               Reason for consult:  Facility Placement, Discharge Planning                Permission sought to share information with:  Facility Sport and exercise psychologist, Family Supports Permission granted to share information::  Yes, Verbal Permission Granted  Name::     [name]  Agency::  [facility]  Relationship::  Daughter  Contact Information:  [phone]  Housing/Transportation Living arrangements for the past 2 months:  [place] of Information:  Patient, Adult Children Patient Interpreter Needed:  None Criminal Activity/Legal Involvement Pertinent to Current Situation/Hospitalization:  No - Comment as needed Significant Relationships:  Adult Children Lives with:  Adult Children Do you feel safe going back to the place where you live?  Yes Need for family participation in patient care:  Yes (Comment) (Patient's daughter active in patient's care.)  Care giving concerns:  Patient's daughter expressed no concerns at this time.   Social Worker assessment / plan:  LCSW received referral for possible SNF placement at time of discharge. Per patient's daughter, patient and patient's family were anticipating patient to discharge to SNF once stable. Patient's daughter reports that patient's family does NOT want patient to be placed at [facility]. LCSW to continue to follow and assist with discharge planning needs.  Employment status:  Retired Science writer) PT Recommendations:  [place] / Referral to community resources:  [place]  Patient/Family's Response to care:  Patient's daughter understanding and agreeable to [facility] plan of care.  Patient/Family's Understanding of and Emotional Response to Diagnosis,  Current Treatment, and Prognosis:  Patient's daughter understanding and agreeable to LCSW plan of care.  Emotional Assessment Appearance:  Appears stated age Attitude/Demeanor/Rapport:  Other (Appropriate) Affect (typically observed):  Accepting, Appropriate, Pleasant Orientation:  Oriented to Self, Oriented to Place, Oriented to  Time, Oriented to Situation Alcohol / Substance use:  Not Applicable Psych involvement (Current and /or in the community):  No (Comment) (Not appropriate on this admission.)  Discharge Needs  Concerns to be addressed:  No discharge needs identified Readmission within the last 30 days:  No Current discharge risk:  None Barriers to Discharge:  No Barriers Identified   Caroline Sauger, LCSW 03/01/2016, 2:51 PM [phone]

## 2016-03-01 NOTE — NC FL2 (Signed)
[place] LEVEL OF CARE SCREENING TOOL     IDENTIFICATION  Patient Name: [name] Birthdate: 1945-09-21 Sex: female Admission Date (Current Location): 02/29/2016  [facility] and [place] Number:  Herbalist and Address:  [facility], [place] [address], [place], [place] [address]      Provider Number: [id number]  Attending Physician Name and Address:  Iran Planas, MD  Relative Name and Phone Number:       Current Level of Care: Hospital Recommended Level of Care: [place] Prior Approval Number:    Date Approved/Denied:   PASRR Number: [id number]  Discharge Plan: SNF    Current Diagnoses: Patient Active Problem List   Diagnosis Date Noted  . Distal radius fracture, left 02/29/2016  . Bilateral swelling of feet   . Radius fracture 02/04/2016  . Fall 02/04/2016  . Closed fracture of styloid process of ulna 02/04/2016  . Left scapholunate ligament tear 02/04/2016  . Distal radius fracture 02/04/2016  . Chronic anemia 02/04/2016  . Cognitive decline 02/04/2016  . Cellulitis 01/19/2016  . Swelling of joint, knee, right 10/19/2015  . Venous insufficiency 10/19/2015  . Lower extremity edema 05/24/2015  . Type 2 diabetes mellitus, controlled ([place]) 05/24/2015  . Decreased appetite 05/24/2015  . Malnutrition of moderate degree ([place]) 06/07/2014  . Acute pericarditis, unspecified 06/07/2014  . Hypoglycemia 06/06/2014  . Fever 06/06/2014  . ECG abnormal 06/06/2014  . Knee pain, chronic 06/06/2014  . Mass of right side of neck 06/06/2014  . Severe protein-calorie malnutrition ([place]) 06/06/2014  . Microcytic anemia 06/06/2014    Orientation RESPIRATION BLADDER Height & Weight     Self, Time, Situation, Place  Normal Continent Weight: 186 lb (84.369 kg) Height:  5\' 7"  (170.2 cm)  BEHAVIORAL SYMPTOMS/MOOD NEUROLOGICAL BOWEL NUTRITION STATUS      Continent Diet (Please see discharge summary.)  AMBULATORY  STATUS COMMUNICATION OF NEEDS Skin    (Has not ambulated with PT) Verbally Surgical wounds                       Personal Care Assistance Level of Assistance  Bathing, Feeding, Dressing Bathing Assistance: Limited assistance Feeding assistance: Limited assistance Dressing Assistance: Limited assistance     Functional Limitations Info             SPECIAL CARE FACTORS FREQUENCY  PT (By licensed PT), OT (By licensed OT)     PT Frequency: 5 OT Frequency: 5            Contractures      Additional Factors Info  Code Status, Allergies Code Status Info: FULL Allergies Info: Orange Concentrate, Peach, Penicillins, Strawberry Extract, Adhesive, Aspirin, Latex, Pineapple, Strawberry Flavor           Current Medications (03/01/2016):  This is the current hospital active medication list Current Facility-Administered Medications  Medication Dose Route Frequency Provider Last Rate Last Dose  . acetaminophen (TYLENOL) tablet 650 mg  650 mg Oral Q6H PRN Iran Planas, MD       Or  . acetaminophen (TYLENOL) suppository 650 mg  650 mg Rectal Q6H PRN Iran Planas, MD      . bisacodyl (DULCOLAX) EC tablet 5 mg  5 mg Oral Daily PRN Iran Planas, MD      . diphenhydrAMINE (BENADRYL) 12.5 MG/5ML elixir 12.5-25 mg  12.5-25 mg Oral Q4H PRN Iran Planas, MD      . docusate sodium (COLACE) capsule 100 mg  100 mg Oral BID Iran Planas, MD   100 mg at 03/01/16 0842  . ferrous sulfate tablet 325 mg  325 mg Oral Q1200 Iran Planas, MD   325 mg at 02/29/16 1823  . furosemide (LASIX) tablet 20 mg  20 mg Oral Daily Iran Planas, MD   20 mg at 03/01/16 0842  . HYDROcodone-acetaminophen (NORCO/VICODIN) 5-325 MG per tablet 1 tablet  1 tablet Oral Q8H PRN Iran Planas, MD      . lactated ringers infusion   Intravenous Continuous Josephine Igo, MD 10 mL/hr at 02/29/16 1118    . methocarbamol (ROBAXIN) tablet 500 mg  500 mg Oral Q6H PRN Iran Planas, MD   500 mg at 03/01/16 0843   Or  .  methocarbamol (ROBAXIN) 500 mg in dextrose 5 % 50 mL IVPB  500 mg Intravenous Q6H PRN Iran Planas, MD      . metoCLOPramide (REGLAN) tablet 5-10 mg  5-10 mg Oral Q8H PRN Iran Planas, MD       Or  . metoCLOPramide (REGLAN) injection 5-10 mg  5-10 mg Intravenous Q8H PRN Iran Planas, MD      . morphine 2 MG/ML injection 1 mg  1 mg Intravenous Q2H PRN Iran Planas, MD      . ondansetron [facility]) tablet 4 mg  4 mg Oral Q6H PRN Iran Planas, MD       Or  . ondansetron [facility]) injection 4 mg  4 mg Intravenous Q6H PRN Iran Planas, MD      . oxyCODONE (Oxy IR/ROXICODONE) immediate release tablet 5-10 mg  5-10 mg Oral Q3H PRN Iran Planas, MD   10 mg at 03/01/16 [id number]     Discharge Medications: Please see discharge summary for a list of discharge medications.  Relevant Imaging Results:  Relevant Lab Results:   Additional Information SSN: [id number]  Caroline Sauger, LCSW

## 2016-03-01 NOTE — Evaluation (Addendum)
Physical Therapy Evaluation Patient Details Name: [name] MRN: [id number] DOB: Sep 17, 1945 Today's Date: 03/01/2016   History of Present Illness  70 y.o. female with Past medical history of essential hypertension, chronic anemia, diet-controlled diabetes, recurrent cellulitis. Pt fell down front steps and daughter fell on top of her, resulting in L proximal ulna fx and distal radius fx. Underwent closed manipulation. Now s/p L ORIF distal radius and L brachioradialus tendon release and tenotomy.   Clinical Impression  Pt at this time is requiring 2 person A simply to stand and pivot to a recliner.  Pt states she was ambulatory at home with a cane prior to L UE radial/ulnar fxs, but now is unable to bear her own weight.  Pt is poor historian and unclear if she is accurate with her PLOF.  At this time pt will need extensive 2 person A for all mobility and safety.  Pt will need SNF level of care unless family wish her to return to home and are able to provide Max to [facility] care.  Will continue to follow.      Follow Up Recommendations SNF    Equipment Recommendations  Rolling walker with 5" wheels    Recommendations for Other Services       Precautions / Restrictions Precautions Precautions: Fall Required Braces or Orthoses: Sling Restrictions Other Position/Activity Restrictions: WB status not specified, adhered to NWB on L UE      Mobility  Bed Mobility Overal bed mobility: Needs Assistance Bed Mobility: Sit to Supine       Sit to supine: Max assist   General bed mobility comments: pt sitting EOB with OT.    Transfers Overall transfer level: Needs assistance Equipment used: 2 person hand held assist Transfers: Sit to/from [facility] Sit to Stand: +2 physical assistance;Max assist Stand pivot transfers: +2 physical assistance;Max assist       General transfer comment: pt with minimal weaghtbearing on either LE and maintains R LE fully extended and  L knee flexed ~30degress.  PT and OT moved Bil LEs through pivot to recliner on L side.    Ambulation/Gait                Stairs            Wheelchair Mobility    Modified Rankin (Stroke Patients Only)       Balance Overall balance assessment: Needs assistance Sitting-balance support: Feet supported;Bilateral upper extremity supported Sitting balance-Leahy Scale: Fair     Standing balance support: During functional activity Standing balance-Leahy Scale: Zero                               Pertinent Vitals/Pain Pain Assessment: 0-10 Pain Score: 7  Pain Location: L UE Pain Descriptors / Indicators: Aching Pain Intervention(s): Monitored during session;Premedicated before session;Repositioned    Home Living Family/patient expects to be discharged to:: Private residence Living Arrangements: Other relatives (granddaughter) Available Help at Discharge: Available PRN/intermittently (granddaughter and sister) Type of Home: House Home Access: Stairs to enter Entrance Stairs-Rails: Psychiatric nurse of Steps: 8 Home Layout: Two level;Bed/bath upstairs;1/2 bath on main level Home Equipment: Cane - single point;Wheelchair - manual Additional Comments: unsure of information due to pt's cognitive status    Prior Function Level of Independence: Needs assistance   Gait / Transfers Assistance Needed: walked with a cane and assistance, went upstairs only for showering  ADL's / Homemaking Assistance Needed:  assist for bathing and dressing  Comments: pt is a poor historian, no family available     Hand Dominance   Dominant Hand: Right    Extremity/Trunk Assessment   Upper Extremity Assessment: Defer to OT evaluation       LUE Deficits / Details: splinted, unable to move fingers, pain with PROM   Lower Extremity Assessment: Generalized weakness;RLE deficits/detail RLE Deficits / Details: Chronic injury to R knee preventing pt from  flexing knee.  pt state that sometimes "it settles" and will flex, but otherwise seems to have near fused knee.  pt also with wound to R ankle area covered with dressing.  Strength is grossly weak.      Cervical / Trunk Assessment: Kyphotic  Communication   Communication: No difficulties  Cognition Arousal/Alertness: Awake/alert Behavior During Therapy: Flat affect Overall Cognitive Status: No family/caregiver present to determine baseline cognitive functioning       Memory: Decreased short-term memory              General Comments      Exercises Other Exercises Other Exercises: PROM L fingers within pain tolerance  Other Exercises: AAROM L shoulder      Assessment/Plan    PT Assessment Patient needs continued PT services  PT Diagnosis Difficulty walking;Generalized weakness;Acute pain;Altered mental status   PT Problem List Decreased strength;Decreased range of motion;Decreased activity tolerance;Decreased balance;Decreased mobility;Decreased cognition;Decreased coordination;Decreased knowledge of use of DME;Decreased safety awareness;Pain  PT Treatment Interventions DME instruction;Gait training;Stair training;Functional mobility training;Therapeutic activities;Therapeutic exercise;Balance training;Patient/family education   PT Goals (Current goals can be found in the Care Plan section) Acute Rehab PT Goals Patient Stated Goal: did not state PT Goal Formulation: Patient unable to participate in goal setting Time For Goal Achievement: 03/15/16 Potential to Achieve Goals: Fair    Frequency Min 3X/week   Barriers to discharge Other (comment) Unclear level of caregiver support as pt indicates she is alone part of the day.      Co-evaluation PT/OT/SLP Co-Evaluation/Treatment: Yes Reason for Co-Treatment: For patient/therapist safety PT goals addressed during session: Mobility/safety with mobility;Balance OT goals addressed during session: ADL's and self-care        End of Session Equipment Utilized During Treatment: Gait belt Activity Tolerance: Patient tolerated treatment well Patient left: in chair;with call bell/phone within reach (with OT) Nurse Communication: Mobility status    Functional Assessment Tool Used: Clinical Judgement Functional Limitation: Mobility: Walking and moving around Mobility: Walking and Moving Around Current Status [id number]): At least 60 percent but less than 80 percent impaired, limited or restricted Mobility: Walking and Moving Around Goal Status [phone]): At least 1 percent but less than 20 percent impaired, limited or restricted    Time: 0909-0919 PT Time Calculation (min) (ACUTE ONLY): 10 min   Charges:   PT Evaluation $PT Eval Moderate Complexity: 1 Procedure     PT G Codes:   PT G-Codes **NOT FOR INPATIENT CLASS** Functional Assessment Tool Used: Clinical Judgement Functional Limitation: Mobility: Walking and moving around Mobility: Walking and Moving Around Current Status [id number]): At least 60 percent but less than 80 percent impaired, limited or restricted Mobility: Walking and Moving Around Goal Status [phone]): At least 1 percent but less than 20 percent impaired, limited or restricted    Catarina Hartshorn, [place] 03/01/2016, 10:56 AM

## 2016-03-01 NOTE — Progress Notes (Signed)
SPOKE WITH NURSE AND CASE MANAGER WILL NEED SNF FACILITY CONTINUE WITH INPATIENT CARE UNTIL BED AVAILABLE FL-2 SIGNED

## 2016-03-01 NOTE — Clinical Social Work Placement (Signed)
   CLINICAL SOCIAL WORK PLACEMENT  NOTE  Date:  03/01/2016  Patient Details  Name: [name] MRN: [id number] Date of Birth: 1946-05-09  Clinical Social Work is seeking post-discharge placement for this patient at the [facility] level of care (*CSW will initial, date and re-position this form in  chart as items are completed):  Yes   Patient/family provided with [facility] Work Department's list of facilities offering this level of care within the geographic area requested by the patient (or if unable, by the patient's family).  Yes   Patient/family informed of their freedom to choose among providers that offer the needed level of care, that participate in Medicare, Medicaid or managed care program needed by the patient, have an available bed and are willing to accept the patient.  Yes   Patient/family informed of [facility]'s ownership interest in [facility] and [facility], as well as of the fact that they are under no obligation to receive care at these facilities.  PASRR submitted to EDS on       PASRR number received on       Existing PASRR number confirmed on 03/01/16     FL2 transmitted to all facilities in geographic area requested by pt/family on 03/01/16     FL2 transmitted to all facilities within larger geographic area on       Patient informed that his/her managed care company has contracts with or will negotiate with certain facilities, including the following:            Patient/family informed of bed offers received.  Patient chooses bed at       Physician recommends and patient chooses bed at      Patient to be transferred to   on  .  Patient to be transferred to facility by       Patient family notified on   of transfer.  Name of family member notified:        PHYSICIAN Please sign FL2     Additional Comment:    _______________________________________________ Caroline Sauger, LCSW 03/01/2016, 2:53  PM

## 2016-03-01 NOTE — Evaluation (Signed)
Occupational Therapy Evaluation Patient Details Name: [name] MRN: [id number] DOB: 1946-05-04 Today's Date: 03/01/2016    History of Present Illness 70 y.o. female with Past medical history of essential hypertension, chronic anemia, diet-controlled diabetes, recurrent cellulitis. Pt fell down front steps and daughter fell on top of her, resulting in L proximal ulna fx and distal radius fx. Underwent closed manipulation. Now s/p L ORIF distal radius and L brachioradialus tendon release and tenotomy.    Clinical Impression   Pt is a poor historian. Presents with L UE pain, generalized weakness, poor balance and dependence in all self care. Pt requires 2 person max assist to pivot to chair without ability to flex or weight bear on her R LE. Recommending SNF. Will follow.    Follow Up Recommendations  SNF;Supervision/Assistance - 24 hour    Equipment Recommendations  3 in 1 bedside comode    Recommendations for Other Services       Precautions / Restrictions Precautions Precautions: Fall Required Braces or Orthoses: Sling Restrictions Other Position/Activity Restrictions: WB status not specified, adhered to NWB on L UE      Mobility Bed Mobility Overal bed mobility: Needs Assistance Bed Mobility: Sit to Supine       Sit to supine: Max assist   General bed mobility comments: assist for all aspects using pad  Transfers Overall transfer level: Needs assistance Equipment used: 2 person hand held assist Transfers: Sit to/from Stand;Stand Pivot Transfers Sit to Stand: +2 physical assistance;Max assist Stand pivot transfers: +2 physical assistance;Max assist       General transfer comment: pt unable to flex or bear weight on R LE to assist with transfer    Balance Overall balance assessment: Needs assistance Sitting-balance support: Feet supported Sitting balance-Leahy Scale: Fair       Standing balance-Leahy Scale: Zero                              ADL Overall ADL's : Needs assistance/impaired Eating/Feeding: Set up;Sitting   Grooming: Wash/dry face;Minimal assistance;Sitting   Upper Body Bathing: Maximal assistance;Sitting   Lower Body Bathing: +2 for physical assistance;Total assistance;Sit to/from stand   Upper Body Dressing : Maximal assistance;Sitting   Lower Body Dressing: +2 for physical assistance;Total assistance;Sit to/from stand   Toilet Transfer: +2 for physical assistance;Maximal assistance;Stand-pivot Toilet Transfer Details (indicate cue type and reason): simulated to chair Toileting- Clothing Manipulation and Hygiene: Total assistance;+2 for physical assistance;Sit to/from stand Toileting - Clothing Manipulation Details (indicate cue type and reason): pt with urinary incontinence             Vision     Perception     Praxis      Pertinent Vitals/Pain Pain Assessment: 0-10 Pain Score: 7  Pain Location: L UE Pain Descriptors / Indicators: Aching Pain Intervention(s): Monitored during session;Premedicated before session;Repositioned;Ice applied     Hand Dominance Right   Extremity/Trunk Assessment Upper Extremity Assessment Upper Extremity Assessment: LUE deficits/detail LUE Deficits / Details: splinted, unable to move fingers, pain with PROM LUE: Unable to fully assess due to pain;Unable to fully assess due to immobilization LUE Coordination: decreased fine motor;decreased gross motor   Lower Extremity Assessment Lower Extremity Assessment: Defer to PT evaluation       Communication Communication Communication: No difficulties   Cognition Arousal/Alertness: Awake/alert Behavior During Therapy: Flat affect Overall Cognitive Status: No family/caregiver present to determine baseline cognitive functioning       Memory:  Decreased short-term memory             General Comments       Exercises Exercises: Other exercises Other Exercises Other Exercises: PROM L fingers within  pain tolerance  Other Exercises: AAROM L shoulder   Shoulder Instructions      Home Living Family/patient expects to be discharged to:: Private residence Living Arrangements: Other relatives (granddaughter) Available Help at Discharge: Available PRN/intermittently (granddaughter and sister) Type of Home: House Home Access: Stairs to enter Technical brewer of Steps: 8 Entrance Stairs-Rails: Right;Left Home Layout: Two level;Bed/bath upstairs;1/2 bath on main level Alternate Level Stairs-Number of Steps: flight Alternate Level Stairs-Rails: Right;Left Bathroom Shower/Tub: Tub/shower unit;Curtain Shower/tub characteristics: Architectural technologist: Standard     Home Equipment: Cane - single point;Wheelchair - manual   Additional Comments: unsure of information due to pt's cognitive status      Prior Functioning/Environment Level of Independence: Needs assistance  Gait / Transfers Assistance Needed: walked with a cane and assistance, went upstairs only for showering ADL's / Homemaking Assistance Needed: assist for bathing and dressing   Comments: pt is a poor historian, no family available    OT Diagnosis: Generalized weakness;Acute pain;Cognitive deficits   OT Problem List: Decreased strength;Decreased range of motion;Decreased activity tolerance;Impaired balance (sitting and/or standing);Decreased coordination;Decreased cognition;Decreased safety awareness;Decreased knowledge of use of DME or AE;Obesity;Pain;Impaired UE functional use   OT Treatment/Interventions: Self-care/ADL training;Therapeutic activities;Patient/family education;Balance training;DME and/or AE instruction;Therapeutic exercise    OT Goals(Current goals can be found in the care plan section) Acute Rehab OT Goals Patient Stated Goal: did not state OT Goal Formulation: With patient Time For Goal Achievement: 03/08/16 Potential to Achieve Goals: Fair ADL Goals Pt Will Perform Grooming: with  supervision;sitting Pt Will Perform Upper Body Bathing: with min assist;sitting Pt Will Perform Upper Body Dressing: with mod assist;sitting Pt Will Transfer to Toilet: with mod assist;stand pivot transfer;bedside commode Pt/caregiver will Perform Home Exercise Program: Increased ROM;Left upper extremity;With minimal assist (L digit and shoulder AAROM) Additional ADL Goal #1: Pt and family will be independent in edema management techniques L UE.  OT Frequency: Min 2X/week   Barriers to D/C: Decreased caregiver support;Inaccessible home environment          Co-evaluation PT/OT/SLP Co-Evaluation/Treatment: Yes Reason for Co-Treatment: For patient/therapist safety   OT goals addressed during session: ADL's and self-care      End of Session Equipment Utilized During Treatment: Gait belt (sling) Nurse Communication: Mobility status  Activity Tolerance: Patient limited by pain Patient left: in chair;with call bell/phone within reach   Time: [id number] OT Time Calculation (min): 46 min Charges:  OT General Charges $OT Visit: 1 Procedure OT Evaluation $OT Eval Moderate Complexity: 1 Procedure OT Treatments $Therapeutic Activity: 8-22 mins G-Codes: OT G-codes **NOT FOR INPATIENT CLASS** Functional Assessment Tool Used: clinical judgement Functional Limitation: Self care Self Care Current Status [id number]): At least 80 percent but less than 100 percent impaired, limited or restricted Self Care Goal Status [id number]): At least 40 percent but less than 60 percent impaired, limited or restricted  Malka So 03/01/2016, 9:44 AM [phone]

## 2016-03-02 DIAGNOSIS — G8929 Other chronic pain: Secondary | ICD-10-CM | POA: Diagnosis not present

## 2016-03-02 DIAGNOSIS — R531 Weakness: Secondary | ICD-10-CM | POA: Diagnosis not present

## 2016-03-02 DIAGNOSIS — M1990 Unspecified osteoarthritis, unspecified site: Secondary | ICD-10-CM | POA: Diagnosis not present

## 2016-03-02 DIAGNOSIS — Z880 Allergy status to penicillin: Secondary | ICD-10-CM | POA: Diagnosis not present

## 2016-03-02 DIAGNOSIS — Z87891 Personal history of nicotine dependence: Secondary | ICD-10-CM | POA: Diagnosis not present

## 2016-03-02 DIAGNOSIS — I10 Essential (primary) hypertension: Secondary | ICD-10-CM | POA: Diagnosis not present

## 2016-03-02 DIAGNOSIS — S52502A Unspecified fracture of the lower end of left radius, initial encounter for closed fracture: Secondary | ICD-10-CM | POA: Diagnosis not present

## 2016-03-02 DIAGNOSIS — M25561 Pain in right knee: Secondary | ICD-10-CM | POA: Diagnosis not present

## 2016-03-02 DIAGNOSIS — E119 Type 2 diabetes mellitus without complications: Secondary | ICD-10-CM | POA: Diagnosis not present

## 2016-03-02 LAB — GLUCOSE, CAPILLARY
Glucose-Capillary: 112 mg/dL — ABNORMAL HIGH (ref 65–99)
Glucose-Capillary: 108 mg/dL — ABNORMAL HIGH (ref 65–99)

## 2016-03-02 NOTE — Progress Notes (Signed)
Occupational Therapy Treatment Patient Details Name: [name] MRN: [id number] DOB: May 12, 1946 Today's Date: 03/02/2016    History of present illness 70 y.o. female with Past medical history of essential hypertension, chronic anemia, diet-controlled diabetes, recurrent cellulitis. Pt fell down front steps and daughter fell on top of her, resulting in L proximal ulna fx and distal radius fx. Underwent closed manipulation. Now s/p L ORIF distal radius and L brachioradialus tendon release and tenotomy.    OT comments  Pt. Guarded and hesitant for participation in skilled OT.  Declined eob/oob.  Max a to scoot up in bed, min guard a for rolling L/R.  Tolerated PROM to L digits.  Will continue to follow acutely.  Note plan is for SNF.    Follow Up Recommendations  SNF;Supervision/Assistance - 24 hour    Equipment Recommendations  3 in 1 bedside comode    Recommendations for Other Services      Precautions / Restrictions Precautions Precautions: Fall Required Braces or Orthoses: Sling Restrictions Other Position/Activity Restrictions: WB status not specified, adhered to NWB on L UE       Mobility Bed Mobility Overal bed mobility: Needs Assistance             General bed mobility comments: pt. declines oob or eob "maybe later".  max a to scoot up in bed and reposition to a more seated postion in bed.  reports she is able to use RUE and b les, but did not engage without max verbal and tactile cues.  therapist asst. with heavy reliance on use of chuck pad to aide in moving pt.   Transfers                 General transfer comment: pt. declines transfers this day    Balance                                   ADL Overall ADL's : Needs assistance/impaired             Lower Body Bathing: Set up;Minimal assistance;Bed level Lower Body Bathing Details (indicate cue type and reason): pt. able to wash front peri area with set up supine, able to roll  with min guard a and required min a for bathing buttocks                       General ADL Comments: pt. declined oob, eob. but reports "im wet lets get that delt with".  able to complete bed mobiltiy min guard a rolling to R side for assistance with peri care and changing linen      Vision                     Perception     Praxis      Cognition                             Extremity/Trunk Assessment               Exercises Other Exercises Other Exercises: PROM L fingers within pain tolerance    Shoulder Instructions       General Comments      Pertinent Vitals/ Pain       Pain Assessment: 0-10 Pain Score: 7  Pain Location: L ue Pain Descriptors / Indicators: Aching Pain Intervention(s): Repositioned;Monitored  during session  Home Living                                          Prior Functioning/Environment              Frequency Min 2X/week     Progress Toward Goals  OT Goals(current goals can now be found in the care plan section)  Progress towards OT goals: Progressing toward goals     Plan Discharge plan remains appropriate    Co-evaluation                 End of Session     Activity Tolerance Patient tolerated treatment well   Patient Left in bed;with call bell/phone within reach   Nurse Communication          Time: [id number] OT Time Calculation (min): 24 min  Charges: OT General Charges $OT Visit: 1 Procedure OT Treatments $Therapeutic Activity: 23-37 mins  Janice Coffin, COTA/L 03/02/2016, 9:46 AM

## 2016-03-02 NOTE — Care Management Note (Addendum)
Case Management Note  Patient Details  Name: [name] MRN: [id number] Date of Birth: 10/26/1945  Subjective/Objective: 70 yo F fell down front steps and daughter fell on top of her, resulting in L proximal ulna fx and distal radius fx. She underwent closed manipulation. s/p L ORIF distal radius and L brachioradialus tendon release and tenotomy.                   Action/Plan: pt needs a new W/C   Expected Discharge Date:      03/04/16            Expected Discharge Plan:  [place]  In-House Referral:  Clinical Social Work  Discharge planning Services  CM Consult  Post Acute Care Choice:    Choice offered to:     DME Arranged:    DME Agency:     HH Arranged:    [place] Agency:     Status of Service:  In process, will continue to follow  If discussed at Long Length of Stay Meetings, dates discussed:    Additional Comments: met with pt at bedside. D/C plan is SNF. She stated that she has a W/C but the wheels won't turn. She received the W/C 2 years ago from a home health agency. She is thinks the [facility] agency was [facility]. Contacted Jamaine at [facility]. He stated that she won't be able to get another W/C because insurance pay for w/c every 5 years, pt made aware of this.   Norina Buzzard, RN 03/02/2016, 1:15 PM

## 2016-03-02 NOTE — Progress Notes (Signed)
Physical Therapy Treatment Patient Details Name: [name] MRN: [id number] DOB: 1945/11/14 Today's Date: 03/02/2016    History of Present Illness 70 y.o. female with Past medical history of essential hypertension, chronic anemia, diet-controlled diabetes, recurrent cellulitis. Pt fell down front steps and daughter fell on top of her, resulting in L proximal ulna fx and distal radius fx. Underwent closed manipulation. Now s/p L ORIF distal radius and L brachioradialus tendon release and tenotomy.     PT Comments    Pt performed sit to stand transfer OOB after being found saturated in urine.  This episode of incontinence appears to be recent.  Skin intact with no redness during clean up.  Pt required increased standing trial during clean-up in stedy.  Pt denies need to urinate before transferring to chair.     Follow Up Recommendations  SNF     Equipment Recommendations  Rolling walker with 5" wheels    Recommendations for Other Services       Precautions / Restrictions Precautions Precautions: Fall Required Braces or Orthoses: Sling Restrictions Weight Bearing Restrictions: Yes Other Position/Activity Restrictions: WB status not specified, adhered to NWB on L UE    Mobility  Bed Mobility Overal bed mobility: Needs Assistance Bed Mobility: Supine to Sit     Supine to sit: Mod assist     General bed mobility comments: Pt required mod assist to advance to edge of bed and max VCs to discorage use of LUE and propping on L elbow.  Pt saturated in urine from incontinence and reports this happens all the time.  Pt required assist with LE advancement and trunk elevation to sit edge of bed.  Pt unable to flex  R knee.    Transfers Overall transfer level: Needs assistance   Transfers: Sit to/from Stand Sit to Stand: Mod assist;+2 physical assistance (in stedy frame.  )         General transfer comment: Pt required cues for hand placement and facilitation of B hips to  sit and stand.  pt stood in stedy during perianal care to clean patient from urinary incontinence.  PTA supported patient mod assist +1 while NT performed care.    Ambulation/Gait                 Stairs            Wheelchair Mobility    Modified Rankin (Stroke Patients Only)       Balance Overall balance assessment: Needs assistance   Sitting balance-Leahy Scale: Fair       Standing balance-Leahy Scale: Poor Standing balance comment: with heavy reliance on cross bar in stedy with R arm.                      Cognition Arousal/Alertness: Awake/alert Behavior During Therapy: Flat affect Overall Cognitive Status: No family/caregiver present to determine baseline cognitive functioning       Memory: Decreased short-term memory              Exercises      General Comments        Pertinent Vitals/Pain Pain Assessment: 0-10 Pain Score: 4  Pain Location: Reports pain in hand.  Pain Descriptors / Indicators: Guarding;Grimacing;Discomfort Pain Intervention(s): Monitored during session;Repositioned    Home Living                      Prior Function  PT Goals (current goals can now be found in the care plan section) Acute Rehab PT Goals Patient Stated Goal: did not state Potential to Achieve Goals: Fair    Frequency  Min 3X/week    PT Plan Current plan remains appropriate    Co-evaluation             End of Session Equipment Utilized During Treatment: Gait belt Activity Tolerance: Patient tolerated treatment well Patient left: in chair;with call bell/phone within reach;with nursing/sitter in room     Time: [id number] PT Time Calculation (min) (ACUTE ONLY): 27 min  Charges:  $Therapeutic Activity: 23-37 mins                    G Codes:      Cristela Blue 09-Mar-2016, 2:02 PM  Governor Rooks, PTA pager [phone]

## 2016-03-03 DIAGNOSIS — E119 Type 2 diabetes mellitus without complications: Secondary | ICD-10-CM | POA: Diagnosis not present

## 2016-03-03 DIAGNOSIS — I10 Essential (primary) hypertension: Secondary | ICD-10-CM | POA: Diagnosis not present

## 2016-03-03 DIAGNOSIS — G8929 Other chronic pain: Secondary | ICD-10-CM | POA: Diagnosis not present

## 2016-03-03 DIAGNOSIS — R531 Weakness: Secondary | ICD-10-CM | POA: Diagnosis not present

## 2016-03-03 DIAGNOSIS — S52502A Unspecified fracture of the lower end of left radius, initial encounter for closed fracture: Secondary | ICD-10-CM | POA: Diagnosis not present

## 2016-03-03 DIAGNOSIS — M1990 Unspecified osteoarthritis, unspecified site: Secondary | ICD-10-CM | POA: Diagnosis not present

## 2016-03-03 DIAGNOSIS — M25561 Pain in right knee: Secondary | ICD-10-CM | POA: Diagnosis not present

## 2016-03-03 DIAGNOSIS — Z880 Allergy status to penicillin: Secondary | ICD-10-CM | POA: Diagnosis not present

## 2016-03-03 DIAGNOSIS — Z87891 Personal history of nicotine dependence: Secondary | ICD-10-CM | POA: Diagnosis not present

## 2016-03-03 LAB — GLUCOSE, CAPILLARY
GLUCOSE-CAPILLARY: 66 mg/dL (ref 65–99)
Glucose-Capillary: 85 mg/dL (ref 65–99)
Glucose-Capillary: 96 mg/dL (ref 65–99)
Glucose-Capillary: 104 mg/dL — ABNORMAL HIGH (ref 65–99)

## 2016-03-03 NOTE — Progress Notes (Signed)
PT IN HOSPITAL AWAITING PLACEMENT WITH SNF NOTES FROM PT AND OT REVIEWED AND CASE MANAGEMENT CONTINUE WITH INPATIENT STAY UNTIL CARE COORDINATED FOR SNF UNIT ICE/ELEVATE  CONTINUE WITH CURRENT SPLINT

## 2016-03-04 DIAGNOSIS — Z87891 Personal history of nicotine dependence: Secondary | ICD-10-CM | POA: Diagnosis not present

## 2016-03-04 DIAGNOSIS — E119 Type 2 diabetes mellitus without complications: Secondary | ICD-10-CM | POA: Diagnosis not present

## 2016-03-04 DIAGNOSIS — Z880 Allergy status to penicillin: Secondary | ICD-10-CM | POA: Diagnosis not present

## 2016-03-04 DIAGNOSIS — G8929 Other chronic pain: Secondary | ICD-10-CM | POA: Diagnosis not present

## 2016-03-04 DIAGNOSIS — I10 Essential (primary) hypertension: Secondary | ICD-10-CM | POA: Diagnosis not present

## 2016-03-04 DIAGNOSIS — M1990 Unspecified osteoarthritis, unspecified site: Secondary | ICD-10-CM | POA: Diagnosis not present

## 2016-03-04 DIAGNOSIS — R531 Weakness: Secondary | ICD-10-CM | POA: Diagnosis not present

## 2016-03-04 DIAGNOSIS — M25561 Pain in right knee: Secondary | ICD-10-CM | POA: Diagnosis not present

## 2016-03-04 DIAGNOSIS — S52502A Unspecified fracture of the lower end of left radius, initial encounter for closed fracture: Secondary | ICD-10-CM | POA: Diagnosis not present

## 2016-03-04 LAB — GLUCOSE, CAPILLARY
GLUCOSE-CAPILLARY: 93 mg/dL (ref 65–99)
Glucose-Capillary: 142 mg/dL — ABNORMAL HIGH (ref 65–99)
Glucose-Capillary: 100 mg/dL — ABNORMAL HIGH (ref 65–99)
Glucose-Capillary: 115 mg/dL — ABNORMAL HIGH (ref 65–99)

## 2016-03-04 NOTE — Clinical Social Work Note (Addendum)
Patient will be discharged to [facility] once medically stable for discharge. Patient will be transported via EMS. Patient's daughter updated regarding discharge. RN report number: [place], [facility]: [phone] Surgical: [phone]

## 2016-03-04 NOTE — Progress Notes (Signed)
   03/04/16 1015  Clinical Encounter Type  Visited With Patient  Visit Type Initial  Spiritual Encounters  Spiritual Needs Emotional  Chaplain visited with patient on initial rounds.  Chaplain had a nice conversation with patient.  Patient received an incoming call.  Chaplain offered further support if needed.

## 2016-03-04 NOTE — Progress Notes (Signed)
Physical Therapy Treatment Patient Details Name: [name] MRN: [id number] DOB: October 13, 1945 Today's Date: 03/04/2016    History of Present Illness 70 y.o. female with Past medical history of essential hypertension, chronic anemia, diet-controlled diabetes, recurrent cellulitis. Pt fell down front steps and daughter fell on top of her, resulting in L proximal ulna fx and distal radius fx. Underwent closed manipulation. Now s/p L ORIF distal radius and L brachioradialus tendon release and tenotomy.     PT Comments    Pt remains reliant on stedy to stand at this time.  Pt will continue to benefit from continued rehab in SNF setting to improve strength and promote functional independence before return home.    Follow Up Recommendations  SNF     Equipment Recommendations  Rolling walker with 5" wheels    Recommendations for Other Services       Precautions / Restrictions Precautions Precautions: Fall Required Braces or Orthoses: Sling Restrictions Weight Bearing Restrictions: Yes Other Position/Activity Restrictions: WB status not specified, adhered to NWB on L UE    Mobility  Bed Mobility Overal bed mobility: Needs Assistance Bed Mobility: Supine to Sit     Supine to sit: Mod assist     General bed mobility comments: Pt required mod assist to advance to edge of bed and max VCs to discorage use of LUE and propping on L elbow.  Pt saturated in urine from incontinence and reports this happens all the time.  Pt required assist with LE advancement and trunk elevation to sit edge of bed.  Pt unable to flex  R knee.    Transfers Overall transfer level: Needs assistance   Transfers: Sit to/from Stand Sit to Stand: Mod assist;+2 physical assistance (in stedy frame) Stand pivot transfers: +2 physical assistance;Max assist       General transfer comment: Pt required cues for hand placement and facilitation of B hips for sit and stand.  pt stood in stedy during perianal care  to clean patient from urinary incontinence.  PTA supported patient stood unsupported in stedy frame while PTA performed perianal care.  pt stood x2 in frame.    Ambulation/Gait                 Stairs            Wheelchair Mobility    Modified Rankin (Stroke Patients Only)       Balance Overall balance assessment: Needs assistance Sitting-balance support: Feet supported;Bilateral upper extremity supported Sitting balance-Leahy Scale: Fair       Standing balance-Leahy Scale: Fair (in stedy)                      Cognition Arousal/Alertness: Awake/alert Behavior During Therapy: Flat affect Overall Cognitive Status: No family/caregiver present to determine baseline cognitive functioning       Memory: Decreased short-term memory (Pt does not recall session with PTA.  Pt reports I don't remember doing any of this despite using the same equipment during previous intervention.  )              Exercises      General Comments        Pertinent Vitals/Pain Pain Assessment: 0-10 Pain Score: 2  Pain Location: Reports pain in R hand.   Pain Descriptors / Indicators: Discomfort Pain Intervention(s): Monitored during session;Repositioned    Home Living  Prior Function            PT Goals (current goals can now be found in the care plan section) Acute Rehab PT Goals Patient Stated Goal: did not state PT Goal Formulation: Patient unable to participate in goal setting Potential to Achieve Goals: Fair Progress towards PT goals: Progressing toward goals    Frequency  Min 3X/week    PT Plan Current plan remains appropriate    Co-evaluation             End of Session Equipment Utilized During Treatment: Gait belt Activity Tolerance: Patient tolerated treatment well Patient left: in chair;with call bell/phone within reach     Time: 1140-1203 PT Time Calculation (min) (ACUTE ONLY): 23 min  Charges:   $Therapeutic Activity: 23-37 mins                    G Codes:      Cristela Blue March 15, 2016, 1:05 PM  Governor Rooks, PTA pager [phone]

## 2016-03-04 NOTE — Clinical Social Work Placement (Addendum)
   CLINICAL SOCIAL WORK PLACEMENT  NOTE  Date:  03/04/2016  Patient Details  Name: [name] MRN: [id number] Date of Birth: 1946-06-11  Clinical Social Work is seeking post-discharge placement for this patient at the [place] level of care (*CSW will initial, date and re-position this form in  chart as items are completed):  Yes   Patient/family provided with [facility] Work Department's list of facilities offering this level of care within the geographic area requested by the patient (or if unable, by the patient's family).  Yes   Patient/family informed of their freedom to choose among providers that offer the needed level of care, that participate in Medicare, Medicaid or managed care program needed by the patient, have an available bed and are willing to accept the patient.  Yes   Patient/family informed of [facility]'s ownership interest in [facility] and [facility], as well as of the fact that they are under no obligation to receive care at these facilities.  PASRR submitted to EDS on       PASRR number received on       Existing PASRR number confirmed on 03/01/16     FL2 transmitted to all facilities in geographic area requested by pt/family on 03/01/16     FL2 transmitted to all facilities within larger geographic area on       Patient informed that his/her managed care company has contracts with or will negotiate with certain facilities, including the following:        Yes   Patient/family informed of bed offers received.  Patient chooses bed at [facility]     Physician recommends and patient chooses bed at      Patient to be transferred to [facility] on 03/05/2016.  Patient to be transferred to facility by [facility]     Patient family notified on 03/05/2016 of transfer.  Name of family member notified:  Daughter     PHYSICIAN Please sign FL2     Additional Comment:     _______________________________________________ Caroline Sauger, LCSW 03/04/2016, 12:20 PM

## 2016-03-05 DIAGNOSIS — S62109A Fracture of unspecified carpal bone, unspecified wrist, initial encounter for closed fracture: Secondary | ICD-10-CM | POA: Diagnosis not present

## 2016-03-05 DIAGNOSIS — R4189 Other symptoms and signs involving cognitive functions and awareness: Secondary | ICD-10-CM | POA: Diagnosis not present

## 2016-03-05 DIAGNOSIS — S52502K Unspecified fracture of the lower end of left radius, subsequent encounter for closed fracture with nonunion: Secondary | ICD-10-CM | POA: Diagnosis not present

## 2016-03-05 DIAGNOSIS — Z87891 Personal history of nicotine dependence: Secondary | ICD-10-CM | POA: Diagnosis not present

## 2016-03-05 DIAGNOSIS — S52502D Unspecified fracture of the lower end of left radius, subsequent encounter for closed fracture with routine healing: Secondary | ICD-10-CM | POA: Diagnosis not present

## 2016-03-05 DIAGNOSIS — S52612D Displaced fracture of left ulna styloid process, subsequent encounter for closed fracture with routine healing: Secondary | ICD-10-CM | POA: Diagnosis not present

## 2016-03-05 DIAGNOSIS — K5901 Slow transit constipation: Secondary | ICD-10-CM | POA: Diagnosis not present

## 2016-03-05 DIAGNOSIS — I10 Essential (primary) hypertension: Secondary | ICD-10-CM | POA: Diagnosis not present

## 2016-03-05 DIAGNOSIS — R531 Weakness: Secondary | ICD-10-CM | POA: Diagnosis not present

## 2016-03-05 DIAGNOSIS — E119 Type 2 diabetes mellitus without complications: Secondary | ICD-10-CM | POA: Diagnosis not present

## 2016-03-05 DIAGNOSIS — R600 Localized edema: Secondary | ICD-10-CM | POA: Diagnosis not present

## 2016-03-05 DIAGNOSIS — W19XXXA Unspecified fall, initial encounter: Secondary | ICD-10-CM | POA: Diagnosis not present

## 2016-03-05 DIAGNOSIS — Z880 Allergy status to penicillin: Secondary | ICD-10-CM | POA: Diagnosis not present

## 2016-03-05 DIAGNOSIS — E1129 Type 2 diabetes mellitus with other diabetic kidney complication: Secondary | ICD-10-CM | POA: Diagnosis not present

## 2016-03-05 DIAGNOSIS — I739 Peripheral vascular disease, unspecified: Secondary | ICD-10-CM | POA: Diagnosis not present

## 2016-03-05 DIAGNOSIS — D509 Iron deficiency anemia, unspecified: Secondary | ICD-10-CM | POA: Diagnosis not present

## 2016-03-05 DIAGNOSIS — E43 Unspecified severe protein-calorie malnutrition: Secondary | ICD-10-CM | POA: Diagnosis not present

## 2016-03-05 DIAGNOSIS — S52502A Unspecified fracture of the lower end of left radius, initial encounter for closed fracture: Secondary | ICD-10-CM | POA: Diagnosis not present

## 2016-03-05 DIAGNOSIS — R259 Unspecified abnormal involuntary movements: Secondary | ICD-10-CM | POA: Diagnosis not present

## 2016-03-05 DIAGNOSIS — G8929 Other chronic pain: Secondary | ICD-10-CM | POA: Diagnosis not present

## 2016-03-05 DIAGNOSIS — Z8612 Personal history of poliomyelitis: Secondary | ICD-10-CM | POA: Diagnosis not present

## 2016-03-05 DIAGNOSIS — M25561 Pain in right knee: Secondary | ICD-10-CM | POA: Diagnosis not present

## 2016-03-05 DIAGNOSIS — M1990 Unspecified osteoarthritis, unspecified site: Secondary | ICD-10-CM | POA: Diagnosis not present

## 2016-03-05 DIAGNOSIS — M1380 Other specified arthritis, unspecified site: Secondary | ICD-10-CM | POA: Diagnosis not present

## 2016-03-05 DIAGNOSIS — S52572D Other intraarticular fracture of lower end of left radius, subsequent encounter for closed fracture with routine healing: Secondary | ICD-10-CM | POA: Diagnosis not present

## 2016-03-05 LAB — GLUCOSE, CAPILLARY
GLUCOSE-CAPILLARY: 91 mg/dL (ref 65–99)
GLUCOSE-CAPILLARY: 82 mg/dL (ref 65–99)
Glucose-Capillary: 115 mg/dL — ABNORMAL HIGH (ref 65–99)
Glucose-Capillary: 80 mg/dL (ref 65–99)

## 2016-03-05 NOTE — Clinical Social Work Note (Signed)
Patient continues to have SNF bed available at [facility] once medically stable for discharge.  Lubertha Sayres, [facility]: [phone] Surgical: [phone]

## 2016-03-05 NOTE — Progress Notes (Signed)
Physical Therapy Treatment Patient Details Name: [name] MRN: [id number] DOB: 1945/11/04 Today's Date: 03/05/2016    History of Present Illness 70 y.o. female with Past medical history of essential hypertension, chronic anemia, diet-controlled diabetes, recurrent cellulitis. Pt fell down front steps and daughter fell on top of her, resulting in L proximal ulna fx and distal radius fx. Underwent closed manipulation. Now s/p L ORIF distal radius and L brachioradialus tendon release and tenotomy.     PT Comments    Pt performed transfer with hemiwalker and able to advance steps from bed to chair.  PTA informed supervising PT of progress and to add gait goals for next visit.    Follow Up Recommendations  SNF     Equipment Recommendations  Rolling walker with 5" wheels    Recommendations for Other Services       Precautions / Restrictions Precautions Precautions: Fall Required Braces or Orthoses: Sling Restrictions Weight Bearing Restrictions: Yes LUE Weight Bearing: Non weight bearing Other Position/Activity Restrictions: WB status not specified, adhered to NWB on L UE    Mobility  Bed Mobility Overal bed mobility: Needs Assistance Bed Mobility: Supine to Sit     Supine to sit: Mod assist Sit to supine: Mod assist   General bed mobility comments: Pt remains to require assist to advance to edge of bed but able to require +1 assistance and improved scooting noted after seated in upright position.    Transfers Overall transfer level: Needs assistance Equipment used: Hemi-walker Transfers: Sit to/from Stand Sit to Stand: Mod assist;+2 physical assistance         General transfer comment: Cues for hand placement, cues foot placement (pt unable flex R knee), required cues to advance RLE back toward bed.  Pt required cues for posture and remains flexed at hips.  Pt able to take 4 steps from bed to chair.  Will inform supervising PT to add gait goals for next visit.     Ambulation/Gait                 Stairs            Wheelchair Mobility    Modified Rankin (Stroke Patients Only)       Balance                                    Cognition Arousal/Alertness: Awake/alert Behavior During Therapy: Flat affect Overall Cognitive Status: No family/caregiver present to determine baseline cognitive functioning       Memory: Decreased short-term memory (Pt remains to forget events from previous sessions.  )              Exercises      General Comments        Pertinent Vitals/Pain Pain Assessment: 0-10 Pain Score: 2  Pain Location: Pt remains to c/o pain in R hand.   Pain Descriptors / Indicators: Discomfort;Throbbing Pain Intervention(s): Monitored during session;Repositioned    Home Living                      Prior Function            PT Goals (current goals can now be found in the care plan section) Acute Rehab PT Goals Patient Stated Goal: did not state Potential to Achieve Goals: Fair Progress towards PT goals: Progressing toward goals    Frequency  Min 3X/week  PT Plan Current plan remains appropriate    Co-evaluation             End of Session Equipment Utilized During Treatment: Gait belt Activity Tolerance: Patient tolerated treatment well Patient left: in chair;with call bell/phone within reach     Time: 1148-1203 PT Time Calculation (min) (ACUTE ONLY): 15 min  Charges:  $Therapeutic Activity: 8-22 mins                    G Codes:      Cristela Blue 04/02/2016, 1:33 PM  Governor Rooks, PTA pager [phone]

## 2016-03-05 NOTE — Progress Notes (Signed)
Report called to [phone], [facility].  All belongings with patient. No distress noted.

## 2016-03-05 NOTE — Discharge Summary (Signed)
Physician Discharge Summary  Patient ID: RACHNA EPPERSON MRN: [id number] DOB/AGE: 72/30/1947 72 y.o.  Admit date: 02/29/2016 Discharge date: 03/05/2016  Admission Diagnoses: LEFT DISTAL RADIUS MALUNION  Past Medical History  Diagnosis Date  . Hypertension   . Chronic knee pain     right  . Polio   . Arthritis   . Distal radius fracture, left   . Diabetes mellitus     A1C has been normal for past 2 years and takes no diabetic meds at present    Discharge Diagnoses:  Active Problems:   Distal radius fracture, left   Surgeries: Procedure(s): LEFT DISTAL RADIUS REPAIR OF MALUNION WITH OPEN REDUCTION INTERNAL FIXATION (ORIF) on 02/29/2016    Consultants:    Discharged Condition: Improved  Hospital Course: KYRAN BIELEN is an 70 y.o. female who was admitted 02/29/2016 with a chief complaint of No chief complaint on file. , and found to have a diagnosis of LEFT DISTAL RADIUS MALUNION .  They were brought to the operating room on 02/29/2016 and underwent Procedure(s): LEFT DISTAL RADIUS REPAIR OF MALUNION WITH OPEN REDUCTION INTERNAL FIXATION (ORIF).    They were given perioperative antibiotics: Anti-infectives    Start     Dose/Rate Route Frequency Ordered Stop   02/29/16 1830  clindamycin (CLEOCIN) IVPB 600 mg     600 mg 100 mL/hr over 30 Minutes Intravenous Every 6 hours 02/29/16 1513 03/01/16 0642   02/29/16 1045  clindamycin (CLEOCIN) IVPB 900 mg  Status:  Discontinued     900 mg 100 mL/hr over 30 Minutes Intravenous On call to O.R. 02/29/16 1041 02/29/16 1627   02/29/16 1042  clindamycin (CLEOCIN) 900 MG/50ML IVPB    Comments:  Scronce, Trina   : cabinet override      02/29/16 1042 02/29/16 1343    .  They were given sequential compression devices, early ambulation, and Other (comment)ambulation for DVT prophylaxis.  Recent vital signs: Patient Vitals for the past 24 hrs:  BP Temp Temp src Pulse Resp SpO2  03/05/16 1100 - - - - - 100 %  03/05/16 0630 137/73 mmHg  98.6 F (37 C) Oral 95 18 100 %  03/04/16 2009 (!) 144/78 mmHg 99.3 F (37.4 C) Oral 99 17 100 %  .  Recent laboratory studies: No results found.  Discharge Medications:     Medication List    TAKE these medications        docusate sodium 100 MG capsule  Commonly known as:  COLACE  Take 1 capsule (100 mg total) by mouth 2 (two) times daily.     docusate sodium 100 MG capsule  Commonly known as:  COLACE  Take 1 capsule (100 mg total) by mouth 2 (two) times daily.     furosemide 20 MG tablet  Commonly known as:  LASIX  Take 1 tablet (20 mg total) by mouth daily.     HYDROcodone-acetaminophen 5-325 MG tablet  Commonly known as:  NORCO/VICODIN  Take 1 tablet by mouth every 8 (eight) hours as needed for moderate pain.     Iron 325 (65 Fe) MG Tabs  Take 325 mg by mouth daily at 12 noon.     methocarbamol 500 MG tablet  Commonly known as:  ROBAXIN  Take 1 tablet (500 mg total) by mouth 4 (four) times daily.     oxyCODONE-acetaminophen 5-325 MG tablet  Commonly known as:  ROXICET  Take 1 tablet by mouth every 4 (four) hours as needed for severe pain.  vitamin C 500 MG tablet  Commonly known as:  ASCORBIC ACID  Take 1 tablet (500 mg total) by mouth daily.        Diagnostic Studies: Dg Chest 2 View  02/04/2016  CLINICAL DATA:  Patient with right leg pain and swelling. No chest complaints. EXAM: CHEST  2 VIEW COMPARISON:  Chest radiograph 06/06/2014. FINDINGS: Stable cardiac and mediastinal contours. Unchanged right peritracheal mass. No consolidative pulmonary opacities. No pleural effusion or pneumothorax. Bilateral shoulder joint degenerative changes. Thoracic spine degenerative changes. IMPRESSION: No acute cardiopulmonary process. Electronically Signed   By: Lovey Newcomer M.D.   On: 02/04/2016 15:26   Dg Elbow Complete Left  02/04/2016  CLINICAL DATA:  Left elbow pain after a fall today. EXAM: LEFT ELBOW - COMPLETE 3+ VIEW COMPARISON:  None. FINDINGS: Longitudinal  fracture along the medial margin of the proximal ulna extending to the articular surface with involvement of the adjacent coronoid process. Large elbow joint effusion. Spurring of the lateral humeral epicondyle. IMPRESSION: 1. Proximal longitudinal ulnar fracture involving the medial articular margin and the medial coronoid process. Large elbow joint effusion. Electronically Signed   By: Van Clines M.D.   On: 02/04/2016 15:32   Dg Wrist 2 Views Left  02/04/2016  CLINICAL DATA:  70 year old female with history of left wrist fracture status postreduction. EXAM: LEFT WRIST - 2 VIEW COMPARISON:  02/04/2016. FINDINGS: Previously noted comminuted fracture of the distal left radius has been reduced, with restoration of near anatomic alignment. Overlying plaster cast obscures finer bony detail. Nondisplaced ulnar styloid avulsion fracture seen on the prior study is obscured. Carpals otherwise appear intact. IMPRESSION: 1. Status post close reduction and cast fixation of distal radial fracture with improved near anatomic alignment, as above. Electronically Signed   By: Vinnie Langton M.D.   On: 02/04/2016 18:01   Dg Wrist Complete Left  02/04/2016  CLINICAL DATA:  Left wrist deformity with swelling after fall. EXAM: LEFT WRIST - COMPLETE 3+ VIEW COMPARISON:  None. FINDINGS: Fracture of the distal radius with posterior impaction and apex anterior angulation. Distal articular surface extension in the radius is noted medially. Widened scapholunate interval on the dedicated scaphoid view, suggesting possible scapholunate ligament tear. Scapholunate angle on the lateral projection normal. Subtle periosteal reaction along the base of the ulnar styloid, suspicious for ulnar styloid fracture, nondisplaced. IMPRESSION: 1. Distal radial fracture with transverse metaphyseal component and a component extending to the distal radial articular surface medially. 2. Nondisplaced ulnar styloid fracture. 3. Potential tear of  the scapholunate ligament. Electronically Signed   By: Van Clines M.D.   On: 02/04/2016 15:46   Ct Elbow Left Wo Contrast  02/04/2016  CLINICAL DATA:  Known proximal ulnar fracture EXAM: CT OF THE LEFT ELBOW WITHOUT CONTRAST TECHNIQUE: Multidetector CT imaging was performed according to the standard protocol. Multiplanar CT image reconstructions were also generated. COMPARISON:  Plain film from earlier in the same day. FINDINGS: Distal humerus is within normal limits. The visualized portion of the radius is unremarkable. There is again noted a fracture through the medial aspect of the articular surface of the proximal ulna similar to that seen on the prior plain film examination. The fracture fragment does not appear to be significantly displaced. There is elevation of the anterior and posterior fat pad similar to that seen on the prior exam consistent with joint effusion. No other soft tissue abnormality is noted. IMPRESSION: Proximal ulnar fracture is again identified and stable when compared with the prior exam. The fracture  fragment is non displaced. Joint effusion is noted. Electronically Signed   By: Inez Catalina M.D.   On: 02/04/2016 21:29    They benefited maximally from their hospital stay and there were no complications.     Disposition: 03-Skilled Nursing Facility      Follow-up Information    Follow up with [name], MD In 2 weeks.   Specialty:  Orthopedic Surgery   Contact information:   [address] [place] [address] [phone]      NWB left wrist Ok to use fingers  Signed: [name] 03/05/2016, 1:37 PM

## 2016-03-07 ENCOUNTER — Non-Acute Institutional Stay (SKILLED_NURSING_FACILITY): Payer: Medicare Other | Admitting: Internal Medicine

## 2016-03-07 ENCOUNTER — Encounter: Payer: Self-pay | Admitting: Internal Medicine

## 2016-03-07 DIAGNOSIS — S52502K Unspecified fracture of the lower end of left radius, subsequent encounter for closed fracture with nonunion: Secondary | ICD-10-CM

## 2016-03-07 DIAGNOSIS — D509 Iron deficiency anemia, unspecified: Secondary | ICD-10-CM

## 2016-03-07 DIAGNOSIS — S52612D Displaced fracture of left ulna styloid process, subsequent encounter for closed fracture with routine healing: Secondary | ICD-10-CM | POA: Diagnosis not present

## 2016-03-07 DIAGNOSIS — K5901 Slow transit constipation: Secondary | ICD-10-CM

## 2016-03-07 DIAGNOSIS — R600 Localized edema: Secondary | ICD-10-CM | POA: Diagnosis not present

## 2016-03-07 DIAGNOSIS — R4189 Other symptoms and signs involving cognitive functions and awareness: Secondary | ICD-10-CM

## 2016-03-07 NOTE — Progress Notes (Signed)
MRN: [id number] Name: [name]  Sex: female Age: 70 y.o. DOB: [birth year]  [facility] #:  Facility/Room:[facility] Level Of Care: SNF Provider: Noah Delaine. Sheppard Coil , MD Emergency Contacts: Extended Emergency Contact Information Primary Emergency Contact: Elyn Aquas [place] Mobile Phone: [phone] Relation: Daughter Secondary Emergency Contact: Meadows,Lura Address: [address] [place], [place] [address] [place] Phone: [phone] Relation: Sister  Code Status: Full Code  Allergies: Orange concentrate; Peach; Penicillins; Strawberry extract; Adhesive; Aspirin; Latex; Pineapple; and Strawberry flavor  Chief Complaint  Patient presents with  . New Admit To SNF    Admit to Facility    HPI: Patient is 70 y.o. female with hx polio who was admitted to [facility] from 6/22-27 for ORIF of L distal radius malunion fractures after a fall. Pt had been in SNF since end of May 2017 for a fall resulting in above injury. She had been independent prior to that but had been declining.Pt is admitted to SNF for OT/PT. While at SNF pt will be followed for anemia, tx with iron, LE edema, tx with lasix and constipation , tx with colace.  Past Medical History  Diagnosis Date  . Hypertension   . Chronic knee pain     right  . Polio   . Arthritis   . Distal radius fracture, left   . Diabetes mellitus     A1C has been normal for past 2 years and takes no diabetic meds at present  . Bilateral swelling of feet   . Radius fracture   . Chronic anemia   . Fall   . Closed fracture of styloid process of ulna   . Left scapholunate ligament tear   . Cellulitis   . Cognitive decline   . Swelling of joint, knee, right   . Venous insufficiency   . Lower extremity edema   . Type 2 diabetes mellitus, controlled ([place])   . Decreased appetite   . Malnutrition of moderate degree ([place])   . Acute pericarditis, unspecified   . Hypoglycemia   .  Fever   . ECG abnormal   . Knee pain, chronic   . Mass of right side of neck   . Severe protein-calorie malnutrition ([place])   . Microcytic anemia   . Hypertension   . Polio   . Arthritis   . Distal radius fracture, left     Past Surgical History  Procedure Laterality Date  . Abdominal hysterectomy    . Open reduction internal fixation (orif) distal radial fracture Left 02/29/2016    Procedure: LEFT DISTAL RADIUS REPAIR OF MALUNION WITH OPEN REDUCTION INTERNAL FIXATION (ORIF);  Surgeon: Iran Planas, MD;  Location: [facility];  Service: Orthopedics;  Laterality: Left;      Medication List       This list is accurate as of: 03/07/16 11:59 PM.  Always use your most recent med list.               docusate sodium 100 MG capsule  Commonly known as:  COLACE  Take 1 capsule (100 mg total) by mouth 2 (two) times daily.     docusate sodium 100 MG capsule  Commonly known as:  COLACE  Take 1 capsule (100 mg total) by mouth 2 (two) times daily.     furosemide 20 MG tablet  Commonly known as:  LASIX  Take 1 tablet (20 mg total) by mouth daily.  HYDROcodone-acetaminophen 5-325 MG tablet  Commonly known as:  NORCO/VICODIN  Take 1 tablet by mouth every 8 (eight) hours as needed for moderate pain.     Iron 325 (65 Fe) MG Tabs  Take 325 mg by mouth daily at 12 noon.     methocarbamol 500 MG tablet  Commonly known as:  ROBAXIN  Take 1 tablet (500 mg total) by mouth 4 (four) times daily.     oxyCODONE-acetaminophen 5-325 MG tablet  Commonly known as:  ROXICET  Take 1 tablet by mouth every 4 (four) hours as needed for severe pain.     vitamin C 500 MG tablet  Commonly known as:  ASCORBIC ACID  Take 1 tablet (500 mg total) by mouth daily.        No orders of the defined types were placed in this encounter.    There is no immunization history for the selected administration types on file for this patient.  Social History  Substance Use Topics  . Smoking status: Former Smoker  -- 0.50 packs/day    Types: Cigarettes    Quit date: 09/09/1968  . Smokeless tobacco: Never Used  . Alcohol Use: No    Family history is   Family History  Problem Relation Age of Onset  . Diabetes Mellitus II Neg Hx   . Lupus Neg Hx   . Sarcoidosis Neg Hx   . Hypertension    . High Cholesterol    . Ovarian cancer Mother   . COPD Father       Review of Systems  DATA OBTAINED: from patient, nurse GENERAL:  no fevers, fatigue, appetite changes SKIN: No itching, rash or wounds EYES: No eye pain, redness, discharge EARS: No earache, tinnitus, change in hearing NOSE: No congestion, drainage or bleeding  MOUTH/THROAT: No mouth or tooth pain, No sore throat RESPIRATORY: No cough, wheezing, SOB CARDIAC: No chest pain, palpitations,+ lower extremity edema  GI: No abdominal pain, No N/V/D or constipation, No heartburn or reflux  GU: No dysuria, frequency or urgency, or incontinence  MUSCULOSKELETAL: No unrelieved bone/joint pain NEUROLOGIC: No headache, dizziness or focal weakness PSYCHIATRIC: No c/o anxiety or sadness   Filed Vitals:   03/07/16 1028  BP: 124/67  Pulse: 99  Temp: 99.6 F (37.6 C)  Resp: 20    SpO2 Readings from Last 1 Encounters:  03/05/16 98%        Physical Exam  GENERAL APPEARANCE: Alert,   No acute distress.  SKIN: No diaphoresis rash HEAD: Normocephalic, atraumatic  EYES: Conjunctiva/lids clear. Pupils round, reactive. EOMs intact.  EARS: External exam WNL, canals clear. Hearing grossly normal.  NOSE: No deformity or discharge.  MOUTH/THROAT: Lips w/o lesions  RESPIRATORY: Breathing is even, unlabored. Lung sounds are clear   CARDIOVASCULAR: Heart RRR no murmurs, rubs or gallops. No peripheral edema.   GASTROINTESTINAL: Abdomen is soft, non-tender, not distended w/ normal bowel sounds. GENITOURINARY: Bladder non tender, not distended  MUSCULOSKELETAL: L forearm  splint NEUROLOGIC:  Cranial nerves 2-12 grossly intact. Moves all extremities   PSYCHIATRIC: Mood and affect appropriate to situation, no behavioral issues  Patient Active Problem List   Diagnosis Date Noted  . Constipation 03/24/2016  . Distal radius fracture, left 02/29/2016  . Bilateral swelling of feet   . Radius fracture 02/04/2016  . Fall 02/04/2016  . Closed fracture of styloid process of ulna 02/04/2016  . Left scapholunate ligament tear 02/04/2016  . Distal radius fracture 02/04/2016  . Chronic anemia 02/04/2016  . Cognitive decline  02/04/2016  . Cellulitis 01/19/2016  . Swelling of joint, knee, right 10/19/2015  . Venous insufficiency 10/19/2015  . Lower extremity edema 05/24/2015  . Type 2 diabetes mellitus, controlled ([place]) 05/24/2015  . Decreased appetite 05/24/2015  . Malnutrition of moderate degree ([place]) 06/07/2014  . Acute pericarditis, unspecified 06/07/2014  . Hypoglycemia 06/06/2014  . Fever 06/06/2014  . ECG abnormal 06/06/2014  . Knee pain, chronic 06/06/2014  . Mass of right side of neck 06/06/2014  . Severe protein-calorie malnutrition ([place]) 06/06/2014  . Microcytic anemia 06/06/2014       Component Value Date/Time   WBC 4.9 02/29/2016 1101   RBC 4.27 02/29/2016 1101   HGB 9.4* 02/29/2016 1101   HCT 31.5* 02/29/2016 1101   PLT 198 02/29/2016 1101   MCV 73.8* 02/29/2016 1101   LYMPHSABS 1.2 02/04/2016 1409   MONOABS 0.3 02/04/2016 1409   EOSABS 0.1 02/04/2016 1409   BASOSABS 0.0 02/04/2016 1409        Component Value Date/Time   NA 141 02/29/2016 1101   K 3.9 02/29/2016 1101   CL 109 02/29/2016 1101   CO2 25 02/29/2016 1101   GLUCOSE 73 02/29/2016 1101   BUN 11 02/29/2016 1101   CREATININE 0.60 02/29/2016 1101   CALCIUM 9.2 02/29/2016 1101   PROT 7.0 02/04/2016 1409   ALBUMIN 3.4* 02/04/2016 1409   AST 18 02/04/2016 1409   ALT 12* 02/04/2016 1409   ALKPHOS 73 02/04/2016 1409   BILITOT 0.7 02/04/2016 1409   GFRNONAA >60 02/29/2016 1101   GFRAA >60 02/29/2016 1101    Lab Results  Component Value Date    HGBA1C 5.3 02/29/2016    No results found for: CHOL, HDL, LDLCALC, LDLDIRECT, TRIG, CHOLHDL   No results found.  Not all labs, radiology exams or other studies done during hospitalization come through on my EPIC note; however they are reviewed by me.    Assessment and Plan  Distal radius fracture, left SNF - s/p ORIF without stated complications;admitted to SNF for OT/PT  Closed fracture of styloid process of ulna SNF - not stated as healing abnormally  Microcytic anemia SNF - d/c Hb 9.4; cont FeSO4 325 mg daily  Lower extremity edema SNF - present; cont Lasix 20 mg daily ;follow BMP  Constipation SNF - stable;cont colace  Cognitive decline SNF - supportive care, esp diet with supplements and OT/PT   Time spent > 45 min;> 50% of time with patient was spent reviewing records, labs, tests and studies, counseling and developing plan of care  Webb Silversmith D. Sheppard Coil, MD

## 2016-03-19 DIAGNOSIS — S52572D Other intraarticular fracture of lower end of left radius, subsequent encounter for closed fracture with routine healing: Secondary | ICD-10-CM | POA: Diagnosis not present

## 2016-03-24 ENCOUNTER — Encounter: Payer: Self-pay | Admitting: Internal Medicine

## 2016-03-24 DIAGNOSIS — K5900 Constipation, unspecified: Secondary | ICD-10-CM | POA: Insufficient documentation

## 2016-03-24 NOTE — Assessment & Plan Note (Signed)
SNF - present; cont Lasix 20 mg daily ;follow BMP

## 2016-03-24 NOTE — Assessment & Plan Note (Signed)
SNF - s/p ORIF without stated complications;admitted to SNF for OT/PT

## 2016-03-24 NOTE — Assessment & Plan Note (Signed)
SNF - supportive care, esp diet with supplements and OT/PT

## 2016-03-24 NOTE — Assessment & Plan Note (Signed)
SNF - d/c Hb 9.4; cont FeSO4 325 mg daily

## 2016-03-24 NOTE — Assessment & Plan Note (Signed)
SNF - stable;cont colace

## 2016-03-24 NOTE — Assessment & Plan Note (Signed)
SNF - not stated as healing abnormally

## 2016-04-03 ENCOUNTER — Encounter: Payer: Self-pay | Admitting: Adult Health

## 2016-04-03 ENCOUNTER — Non-Acute Institutional Stay (SKILLED_NURSING_FACILITY): Payer: Medicare Other | Admitting: Adult Health

## 2016-04-03 DIAGNOSIS — W19XXXA Unspecified fall, initial encounter: Secondary | ICD-10-CM | POA: Diagnosis not present

## 2016-04-03 DIAGNOSIS — S52612D Displaced fracture of left ulna styloid process, subsequent encounter for closed fracture with routine healing: Secondary | ICD-10-CM

## 2016-04-03 DIAGNOSIS — D509 Iron deficiency anemia, unspecified: Secondary | ICD-10-CM

## 2016-04-03 DIAGNOSIS — E43 Unspecified severe protein-calorie malnutrition: Secondary | ICD-10-CM

## 2016-04-03 NOTE — Progress Notes (Signed)
Patient ID: [name], female   DOB: 1946/08/24, 70 y.o.   MRN: [id number]   Location:   [facility] Room Number: 117-A Place of Service:  SNF (31)    CODE STATUS: Full Code  Allergies  Allergen Reactions  . Orange Concentrate [Flavoring Agent] Shortness Of Breath, Itching and Swelling  . Peach [Prunus Persica] Shortness Of Breath, Itching and Swelling  . Penicillins Anaphylaxis and Hives    All 'cillins' Has patient had a PCN reaction causing immediate rash, facial/tongue/throat swelling, SOB or lightheadedness with hypotension: Yes Has patient had a PCN reaction causing severe rash involving mucus membranes or skin necrosis: No Has patient had a PCN reaction that required hospitalization No Has patient had a PCN reaction occurring within the last 10 years: No If all of the above answers are "NO", then may proceed with Cephalosporin use.   . Strawberry Extract Shortness Of Breath, Itching and Swelling  . Adhesive [Tape] Itching and Rash    Please use "paper" tape  . Aspirin Itching  . Latex Itching and Rash  . Pineapple Itching and Rash  . Strawberry Flavor Itching and Rash    Chief Complaint  Patient presents with  . Discharge Note    Discharge from facility    HPI:  She is being discharged to home with home health for pt/ot. She will need a shower bench and small base quad cane. She will need her prescriptions to be written and will need to follow up with her medical provider.    Past Medical History:  Diagnosis Date  . Acute pericarditis, unspecified   . Arthritis   . Arthritis   . Bilateral swelling of feet   . Cellulitis   . Chronic anemia   . Chronic knee pain    right  . Closed fracture of styloid process of ulna   . Cognitive decline   . Decreased appetite   . Diabetes mellitus    A1C has been normal for past 2 years and takes no diabetic meds at present  . Distal radius fracture, left   . Distal radius fracture, left   . ECG  abnormal   . Fall   . Fever   . Hypertension   . Hypertension   . Hypoglycemia   . Knee pain, chronic   . Left scapholunate ligament tear   . Lower extremity edema   . Malnutrition of moderate degree ([place])   . Mass of right side of neck   . Microcytic anemia   . Polio   . Polio   . Radius fracture   . Severe protein-calorie malnutrition ([place])   . Swelling of joint, knee, right   . Type 2 diabetes mellitus, controlled ([place])   . Venous insufficiency     Past Surgical History:  Procedure Laterality Date  . ABDOMINAL HYSTERECTOMY    . OPEN REDUCTION INTERNAL FIXATION (ORIF) DISTAL RADIAL FRACTURE Left 02/29/2016   Procedure: LEFT DISTAL RADIUS REPAIR OF MALUNION WITH OPEN REDUCTION INTERNAL FIXATION (ORIF);  Surgeon: Iran Planas, MD;  Location: [facility];  Service: Orthopedics;  Laterality: Left;    Social History   Social History  . Marital status: Widowed    Spouse name: N/A  . Number of children: 2  . Years of education: 12   Occupational History  . Not on file.   Social History Main Topics  . Smoking status: Former Smoker    Packs/day: 0.50    Types: Cigarettes    Quit date:  09/09/1968  . Smokeless tobacco: Never Used  . Alcohol use No  . Drug use: No  . Sexual activity: No   Other Topics Concern  . Not on file   Social History Narrative   Has family who helps her get around.     Fun: Read   Denies abuse and feel safe at home.   Family History  Problem Relation Age of Onset  . Ovarian cancer Mother   . COPD Father   . Hypertension    . High Cholesterol    . Diabetes Mellitus II Neg Hx   . Lupus Neg Hx   . Sarcoidosis Neg Hx     VITAL SIGNS BP 116/68   Pulse 78   Temp (!) 96.7 F (35.9 C) (Oral)   Resp 20   Ht 5\' 1"  (1.549 m)   Wt 171 lb 8 oz (77.8 kg)   SpO2 99%   BMI 32.40 kg/m   Patient's Medications  New Prescriptions   No medications on file  Previous Medications   DOCUSATE SODIUM (COLACE) 100 MG CAPSULE    Take 1 capsule (100 mg  total) by mouth 2 (two) times daily.   FERROUS SULFATE (IRON) 325 (65 FE) MG TABS    Take 325 mg by mouth daily at 12 noon.   FUROSEMIDE (LASIX) 20 MG TABLET    Take 1 tablet (20 mg total) by mouth daily.   HYDROCODONE-ACETAMINOPHEN (NORCO/VICODIN) 5-325 MG TABLET    Take 1 tablet by mouth every 8 (eight) hours as needed for moderate pain.   METHOCARBAMOL (ROBAXIN) 500 MG TABLET    Take 1 tablet (500 mg total) by mouth 4 (four) times daily.   OXYCODONE-ACETAMINOPHEN (ROXICET) 5-325 MG TABLET    Take 1 tablet by mouth every 4 (four) hours as needed for severe pain.   VITAMIN C (ASCORBIC ACID) 500 MG TABLET    Take 1 tablet (500 mg total) by mouth daily.  Modified Medications   No medications on file  Discontinued Medications   DOCUSATE SODIUM (COLACE) 100 MG CAPSULE    Take 1 capsule (100 mg total) by mouth 2 (two) times daily.     SIGNIFICANT DIAGNOSTIC EXAMS   Review of Systems  Constitutional: Negative for malaise/fatigue.  Respiratory: Negative for cough and shortness of breath.   Cardiovascular: Negative for chest pain, palpitations and leg swelling.  Gastrointestinal: Negative for abdominal pain, constipation and heartburn.  Musculoskeletal: Negative for back pain, joint pain and myalgias.  Skin: Negative.   Neurological: Negative for dizziness.  Psychiatric/Behavioral: The patient is not nervous/anxious.     Physical Exam  Constitutional: No distress.  Eyes: Conjunctivae are normal.  Neck: Neck supple. No JVD present. No thyromegaly present.  Cardiovascular: Normal rate, regular rhythm and intact distal pulses.   Respiratory: Effort normal and breath sounds normal. No respiratory distress. She has no wheezes.  GI: Soft. Bowel sounds are normal. She exhibits no distension. There is no tenderness.  Musculoskeletal: She exhibits no edema.  Able to move all extremities  Left arm in splint   Lymphadenopathy:    She has no cervical adenopathy.  Neurological: She is alert.    Skin: Skin is warm and dry. She is not diaphoretic.  Psychiatric: She has a normal mood and affect.     ASSESSMENT/ PLAN:   Patient is being discharged with the following home health services:  Pt/ot: to evaluate and treat as indicated for gait; balance; strength; adl training  Patient is being discharged with  the following durable medical equipment:  She will need a shower chair and small base quad cane.    Patient has been advised to f/u with their PCP in 1-2 weeks to bring them up to date on their rehab stay.  Social services at facility was responsible for arranging this appointment.  Pt was provided with a 30 day supply of prescriptions for medications and refills must be obtained from their PCP.  For controlled substances, a more limited supply may be provided adequate until PCP appointment only. #15 percocet 5/35 mg tabs   Time spent with patient  45  minutes >50% time spent counseling; reviewing medical record; tests; labs; and developing future plan of care    Ok Edwards NP [facility]  Contact [phone] Monday through Friday 8am- 5pm  After hours call [phone]

## 2016-04-09 DIAGNOSIS — S62102D Fracture of unspecified carpal bone, left wrist, subsequent encounter for fracture with routine healing: Secondary | ICD-10-CM | POA: Diagnosis not present

## 2016-04-09 DIAGNOSIS — S52572D Other intraarticular fracture of lower end of left radius, subsequent encounter for closed fracture with routine healing: Secondary | ICD-10-CM | POA: Diagnosis not present

## 2016-04-10 ENCOUNTER — Telehealth: Payer: Self-pay | Admitting: Emergency Medicine

## 2016-04-10 DIAGNOSIS — S52502P Unspecified fracture of the lower end of left radius, subsequent encounter for closed fracture with malunion: Secondary | ICD-10-CM | POA: Diagnosis not present

## 2016-04-10 DIAGNOSIS — E119 Type 2 diabetes mellitus without complications: Secondary | ICD-10-CM | POA: Diagnosis not present

## 2016-04-10 DIAGNOSIS — S63512D Sprain of carpal joint of left wrist, subsequent encounter: Secondary | ICD-10-CM | POA: Diagnosis not present

## 2016-04-10 DIAGNOSIS — Z4789 Encounter for other orthopedic aftercare: Secondary | ICD-10-CM | POA: Diagnosis not present

## 2016-04-10 DIAGNOSIS — D509 Iron deficiency anemia, unspecified: Secondary | ICD-10-CM | POA: Diagnosis not present

## 2016-04-10 DIAGNOSIS — I872 Venous insufficiency (chronic) (peripheral): Secondary | ICD-10-CM | POA: Diagnosis not present

## 2016-04-10 DIAGNOSIS — Z9181 History of falling: Secondary | ICD-10-CM | POA: Diagnosis not present

## 2016-04-10 DIAGNOSIS — M24561 Contracture, right knee: Secondary | ICD-10-CM | POA: Diagnosis not present

## 2016-04-10 DIAGNOSIS — M6281 Muscle weakness (generalized): Secondary | ICD-10-CM | POA: Diagnosis not present

## 2016-04-10 DIAGNOSIS — E43 Unspecified severe protein-calorie malnutrition: Secondary | ICD-10-CM | POA: Diagnosis not present

## 2016-04-10 DIAGNOSIS — M1990 Unspecified osteoarthritis, unspecified site: Secondary | ICD-10-CM | POA: Diagnosis not present

## 2016-04-10 DIAGNOSIS — I10 Essential (primary) hypertension: Secondary | ICD-10-CM | POA: Diagnosis not present

## 2016-04-10 NOTE — Telephone Encounter (Signed)
[facility] called and would like a verbal order for PT. 2 times a wk for 4 wks for transfers, gait/balance training.

## 2016-04-10 NOTE — Telephone Encounter (Signed)
LVM letting Monique know PT is ok per Marya Amsler

## 2016-04-11 ENCOUNTER — Ambulatory Visit: Payer: Medicare Other | Admitting: Family

## 2016-04-11 DIAGNOSIS — Z7689 Persons encountering health services in other specified circumstances: Secondary | ICD-10-CM

## 2016-04-12 ENCOUNTER — Telehealth: Payer: Self-pay | Admitting: Family

## 2016-04-12 NOTE — Telephone Encounter (Signed)
Ok to reschedule if she calls back.  

## 2016-04-12 NOTE — Telephone Encounter (Signed)
noted 

## 2016-04-12 NOTE — Telephone Encounter (Signed)
Patient no showed for rehab follow up on 8/3.  Please advise.

## 2016-04-17 ENCOUNTER — Telehealth: Payer: Self-pay | Admitting: Family

## 2016-04-17 NOTE — Telephone Encounter (Signed)
Patient missed PT visit today.  States patient states she does not know when she will be home.  States patients not making herself available.

## 2016-04-17 NOTE — Telephone Encounter (Signed)
Noted  

## 2016-04-17 NOTE — Telephone Encounter (Signed)
Sharyn Lull with [facility] called back in regard.  States that patient has been a no show several times and is requesting ok to discharge patient from home health. Please call back [phone]

## 2016-04-17 NOTE — Telephone Encounter (Signed)
Noted! Thank you

## 2016-04-17 NOTE — Telephone Encounter (Signed)
Requesting verbal order to move OT eval to today.  [facility] for this.

## 2016-04-18 NOTE — Telephone Encounter (Signed)
Notified Michelle

## 2016-04-18 NOTE — Telephone Encounter (Signed)
Ok to discharge per patient non-compliance. We reorder if indicated.

## 2016-04-24 DIAGNOSIS — I10 Essential (primary) hypertension: Secondary | ICD-10-CM | POA: Diagnosis not present

## 2016-04-24 DIAGNOSIS — E119 Type 2 diabetes mellitus without complications: Secondary | ICD-10-CM | POA: Diagnosis not present

## 2016-04-24 DIAGNOSIS — I872 Venous insufficiency (chronic) (peripheral): Secondary | ICD-10-CM | POA: Diagnosis not present

## 2016-04-24 DIAGNOSIS — D509 Iron deficiency anemia, unspecified: Secondary | ICD-10-CM | POA: Diagnosis not present

## 2016-04-24 DIAGNOSIS — M24561 Contracture, right knee: Secondary | ICD-10-CM | POA: Diagnosis not present

## 2016-04-24 DIAGNOSIS — S52502P Unspecified fracture of the lower end of left radius, subsequent encounter for closed fracture with malunion: Secondary | ICD-10-CM | POA: Diagnosis not present

## 2016-04-24 DIAGNOSIS — Z4789 Encounter for other orthopedic aftercare: Secondary | ICD-10-CM | POA: Diagnosis not present

## 2016-04-24 DIAGNOSIS — M1990 Unspecified osteoarthritis, unspecified site: Secondary | ICD-10-CM | POA: Diagnosis not present

## 2016-04-24 DIAGNOSIS — M6281 Muscle weakness (generalized): Secondary | ICD-10-CM | POA: Diagnosis not present

## 2016-04-24 DIAGNOSIS — S63512D Sprain of carpal joint of left wrist, subsequent encounter: Secondary | ICD-10-CM | POA: Diagnosis not present

## 2016-04-24 DIAGNOSIS — Z9181 History of falling: Secondary | ICD-10-CM | POA: Diagnosis not present

## 2016-04-24 DIAGNOSIS — E43 Unspecified severe protein-calorie malnutrition: Secondary | ICD-10-CM | POA: Diagnosis not present

## 2016-04-26 DIAGNOSIS — I10 Essential (primary) hypertension: Secondary | ICD-10-CM | POA: Diagnosis not present

## 2016-04-26 DIAGNOSIS — Z9181 History of falling: Secondary | ICD-10-CM | POA: Diagnosis not present

## 2016-04-26 DIAGNOSIS — I872 Venous insufficiency (chronic) (peripheral): Secondary | ICD-10-CM | POA: Diagnosis not present

## 2016-04-26 DIAGNOSIS — S63512D Sprain of carpal joint of left wrist, subsequent encounter: Secondary | ICD-10-CM | POA: Diagnosis not present

## 2016-04-26 DIAGNOSIS — E119 Type 2 diabetes mellitus without complications: Secondary | ICD-10-CM | POA: Diagnosis not present

## 2016-04-26 DIAGNOSIS — E43 Unspecified severe protein-calorie malnutrition: Secondary | ICD-10-CM | POA: Diagnosis not present

## 2016-04-26 DIAGNOSIS — M1990 Unspecified osteoarthritis, unspecified site: Secondary | ICD-10-CM | POA: Diagnosis not present

## 2016-04-26 DIAGNOSIS — D509 Iron deficiency anemia, unspecified: Secondary | ICD-10-CM | POA: Diagnosis not present

## 2016-04-26 DIAGNOSIS — M24561 Contracture, right knee: Secondary | ICD-10-CM | POA: Diagnosis not present

## 2016-04-26 DIAGNOSIS — Z4789 Encounter for other orthopedic aftercare: Secondary | ICD-10-CM | POA: Diagnosis not present

## 2016-04-26 DIAGNOSIS — S52502P Unspecified fracture of the lower end of left radius, subsequent encounter for closed fracture with malunion: Secondary | ICD-10-CM | POA: Diagnosis not present

## 2016-04-26 DIAGNOSIS — M6281 Muscle weakness (generalized): Secondary | ICD-10-CM | POA: Diagnosis not present

## 2016-04-30 DIAGNOSIS — S52572D Other intraarticular fracture of lower end of left radius, subsequent encounter for closed fracture with routine healing: Secondary | ICD-10-CM | POA: Diagnosis not present

## 2016-05-01 DIAGNOSIS — E43 Unspecified severe protein-calorie malnutrition: Secondary | ICD-10-CM | POA: Diagnosis not present

## 2016-05-01 DIAGNOSIS — M24561 Contracture, right knee: Secondary | ICD-10-CM | POA: Diagnosis not present

## 2016-05-01 DIAGNOSIS — M6281 Muscle weakness (generalized): Secondary | ICD-10-CM | POA: Diagnosis not present

## 2016-05-01 DIAGNOSIS — I10 Essential (primary) hypertension: Secondary | ICD-10-CM | POA: Diagnosis not present

## 2016-05-01 DIAGNOSIS — M1990 Unspecified osteoarthritis, unspecified site: Secondary | ICD-10-CM | POA: Diagnosis not present

## 2016-05-01 DIAGNOSIS — Z9181 History of falling: Secondary | ICD-10-CM | POA: Diagnosis not present

## 2016-05-01 DIAGNOSIS — S52502P Unspecified fracture of the lower end of left radius, subsequent encounter for closed fracture with malunion: Secondary | ICD-10-CM | POA: Diagnosis not present

## 2016-05-01 DIAGNOSIS — D509 Iron deficiency anemia, unspecified: Secondary | ICD-10-CM | POA: Diagnosis not present

## 2016-05-01 DIAGNOSIS — Z4789 Encounter for other orthopedic aftercare: Secondary | ICD-10-CM | POA: Diagnosis not present

## 2016-05-01 DIAGNOSIS — S63512D Sprain of carpal joint of left wrist, subsequent encounter: Secondary | ICD-10-CM | POA: Diagnosis not present

## 2016-05-01 DIAGNOSIS — I872 Venous insufficiency (chronic) (peripheral): Secondary | ICD-10-CM | POA: Diagnosis not present

## 2016-05-01 DIAGNOSIS — E119 Type 2 diabetes mellitus without complications: Secondary | ICD-10-CM | POA: Diagnosis not present

## 2016-05-07 ENCOUNTER — Other Ambulatory Visit: Payer: Self-pay | Admitting: Adult Health

## 2016-05-08 DIAGNOSIS — E43 Unspecified severe protein-calorie malnutrition: Secondary | ICD-10-CM | POA: Diagnosis not present

## 2016-05-08 DIAGNOSIS — M24561 Contracture, right knee: Secondary | ICD-10-CM | POA: Diagnosis not present

## 2016-05-08 DIAGNOSIS — I872 Venous insufficiency (chronic) (peripheral): Secondary | ICD-10-CM | POA: Diagnosis not present

## 2016-05-08 DIAGNOSIS — Z4789 Encounter for other orthopedic aftercare: Secondary | ICD-10-CM | POA: Diagnosis not present

## 2016-05-08 DIAGNOSIS — D509 Iron deficiency anemia, unspecified: Secondary | ICD-10-CM | POA: Diagnosis not present

## 2016-05-08 DIAGNOSIS — M6281 Muscle weakness (generalized): Secondary | ICD-10-CM | POA: Diagnosis not present

## 2016-05-08 DIAGNOSIS — E119 Type 2 diabetes mellitus without complications: Secondary | ICD-10-CM | POA: Diagnosis not present

## 2016-05-08 DIAGNOSIS — M1990 Unspecified osteoarthritis, unspecified site: Secondary | ICD-10-CM | POA: Diagnosis not present

## 2016-05-08 DIAGNOSIS — S63512D Sprain of carpal joint of left wrist, subsequent encounter: Secondary | ICD-10-CM | POA: Diagnosis not present

## 2016-05-08 DIAGNOSIS — I10 Essential (primary) hypertension: Secondary | ICD-10-CM | POA: Diagnosis not present

## 2016-05-08 DIAGNOSIS — Z9181 History of falling: Secondary | ICD-10-CM | POA: Diagnosis not present

## 2016-05-08 DIAGNOSIS — S52502P Unspecified fracture of the lower end of left radius, subsequent encounter for closed fracture with malunion: Secondary | ICD-10-CM | POA: Diagnosis not present

## 2016-05-10 DIAGNOSIS — Z9181 History of falling: Secondary | ICD-10-CM | POA: Diagnosis not present

## 2016-05-10 DIAGNOSIS — D509 Iron deficiency anemia, unspecified: Secondary | ICD-10-CM | POA: Diagnosis not present

## 2016-05-10 DIAGNOSIS — E119 Type 2 diabetes mellitus without complications: Secondary | ICD-10-CM | POA: Diagnosis not present

## 2016-05-10 DIAGNOSIS — E43 Unspecified severe protein-calorie malnutrition: Secondary | ICD-10-CM | POA: Diagnosis not present

## 2016-05-10 DIAGNOSIS — S52502P Unspecified fracture of the lower end of left radius, subsequent encounter for closed fracture with malunion: Secondary | ICD-10-CM | POA: Diagnosis not present

## 2016-05-10 DIAGNOSIS — M24561 Contracture, right knee: Secondary | ICD-10-CM | POA: Diagnosis not present

## 2016-05-10 DIAGNOSIS — I10 Essential (primary) hypertension: Secondary | ICD-10-CM | POA: Diagnosis not present

## 2016-05-10 DIAGNOSIS — S63512D Sprain of carpal joint of left wrist, subsequent encounter: Secondary | ICD-10-CM | POA: Diagnosis not present

## 2016-05-10 DIAGNOSIS — I872 Venous insufficiency (chronic) (peripheral): Secondary | ICD-10-CM | POA: Diagnosis not present

## 2016-05-10 DIAGNOSIS — M6281 Muscle weakness (generalized): Secondary | ICD-10-CM | POA: Diagnosis not present

## 2016-05-10 DIAGNOSIS — M1990 Unspecified osteoarthritis, unspecified site: Secondary | ICD-10-CM | POA: Diagnosis not present

## 2016-05-10 DIAGNOSIS — Z4789 Encounter for other orthopedic aftercare: Secondary | ICD-10-CM | POA: Diagnosis not present

## 2016-05-29 ENCOUNTER — Other Ambulatory Visit: Payer: Self-pay | Admitting: Adult Health

## 2016-06-01 ENCOUNTER — Other Ambulatory Visit: Payer: Self-pay | Admitting: Adult Health

## 2016-07-04 ENCOUNTER — Other Ambulatory Visit: Payer: Self-pay | Admitting: Endocrinology

## 2016-07-09 DIAGNOSIS — S52572D Other intraarticular fracture of lower end of left radius, subsequent encounter for closed fracture with routine healing: Secondary | ICD-10-CM | POA: Diagnosis not present

## 2016-10-14 ENCOUNTER — Ambulatory Visit: Payer: Medicare Other | Admitting: Family

## 2016-11-13 ENCOUNTER — Emergency Department (HOSPITAL_COMMUNITY)
Admission: EM | Admit: 2016-11-13 | Discharge: 2016-11-13 | Disposition: A | Discharge status: Home / Self Care | Payer: Medicare Other | Attending: Emergency Medicine | Admitting: Emergency Medicine

## 2016-11-13 ENCOUNTER — Encounter (HOSPITAL_COMMUNITY): Payer: Self-pay | Admitting: Emergency Medicine

## 2016-11-13 DIAGNOSIS — R21 Rash and other nonspecific skin eruption: Secondary | ICD-10-CM | POA: Diagnosis present

## 2016-11-13 DIAGNOSIS — E119 Type 2 diabetes mellitus without complications: Secondary | ICD-10-CM | POA: Insufficient documentation

## 2016-11-13 DIAGNOSIS — Z91040 Latex allergy status: Secondary | ICD-10-CM | POA: Insufficient documentation

## 2016-11-13 DIAGNOSIS — R609 Edema, unspecified: Secondary | ICD-10-CM

## 2016-11-13 DIAGNOSIS — I872 Venous insufficiency (chronic) (peripheral): Secondary | ICD-10-CM

## 2016-11-13 DIAGNOSIS — L0100 Impetigo, unspecified: Secondary | ICD-10-CM

## 2016-11-13 DIAGNOSIS — R600 Localized edema: Secondary | ICD-10-CM | POA: Diagnosis not present

## 2016-11-13 DIAGNOSIS — I10 Essential (primary) hypertension: Secondary | ICD-10-CM | POA: Insufficient documentation

## 2016-11-13 DIAGNOSIS — Z79899 Other long term (current) drug therapy: Secondary | ICD-10-CM | POA: Diagnosis not present

## 2016-11-13 DIAGNOSIS — Z87891 Personal history of nicotine dependence: Secondary | ICD-10-CM | POA: Diagnosis not present

## 2016-11-13 LAB — I-STAT CHEM 8, ED
BUN: 15 mg/dL (ref 6–20)
Calcium, Ion: 1.22 mmol/L (ref 1.15–1.40)
Chloride: 107 mmol/L (ref 101–111)
Creatinine, Ser: 0.7 mg/dL (ref 0.44–1.00)
GLUCOSE: 100 mg/dL — AB (ref 65–99)
HCT: 37 % (ref 36.0–46.0)
HEMOGLOBIN: 12.6 g/dL (ref 12.0–15.0)
POTASSIUM: 4.1 mmol/L (ref 3.5–5.1)
Sodium: 145 mmol/L (ref 135–145)
TCO2: 29 mmol/L (ref 0–100)

## 2016-11-13 MED ORDER — CLINDAMYCIN HCL 150 MG PO CAPS
300.0000 mg | ORAL_CAPSULE | Freq: Once | ORAL | Status: AC
  Administered 2016-11-13: 300 mg via ORAL
  Filled 2016-11-13: qty 2

## 2016-11-13 MED ORDER — FUROSEMIDE 40 MG PO TABS: 40.0000 mg | ORAL_TABLET | Freq: Every day | ORAL | 0 refills | Status: DC

## 2016-11-13 MED ORDER — CLINDAMYCIN HCL 150 MG PO CAPS: 300.0000 mg | ORAL_CAPSULE | Freq: Three times a day (TID) | ORAL | 0 refills | Status: AC

## 2016-11-13 MED ORDER — MUPIROCIN CALCIUM 2 % EX CREA
TOPICAL_CREAM | Freq: Once | CUTANEOUS | Status: AC
  Administered 2016-11-13: 1 via TOPICAL
  Filled 2016-11-13: qty 15

## 2016-11-13 NOTE — ED Notes (Signed)
Pt verbalized understanding discharge instructions and denies any further needs or questions at this time. VS stable, ambulatory and steady gait.   

## 2016-11-13 NOTE — ED Provider Notes (Signed)
[facility] DEPT Provider Note   CSN: [id number] Arrival date & time: 11/13/16  1658  By signing my name below, I, [name], attest that this documentation has been prepared under the direction and in the presence of [name], MD . Electronically Signed: Evelene [name], Scribe. 11/13/2016. 6:32 PM.   History   Chief Complaint Chief Complaint  Patient presents with  . Hand Problem  . Rash    The history is provided by the patient. No language interpreter was used.     HPI Comments:  [name] is a 71 y.o. female with a history of HTN, DM, cellulitis, and venous insufficiency, who presents to the Emergency Department complaining of a burning rash to the back of the right hand x 4 days. No alleviating factors noted. No fever. She is also complaining of swelling to her BLE. Denies h/o CHF but states she has been on lasix in the past.  Past Medical History:  Diagnosis Date  . Acute pericarditis, unspecified   . Arthritis   . Arthritis   . Bilateral swelling of feet   . Cellulitis   . Chronic anemia   . Chronic knee pain    right  . Closed fracture of styloid process of ulna   . Cognitive decline   . Decreased appetite   . Diabetes mellitus    A1C has been normal for past 2 years and takes no diabetic meds at present  . Distal radius fracture, left   . Distal radius fracture, left   . ECG abnormal   . Fall   . Fever   . Hypertension   . Hypertension   . Hypoglycemia   . Knee pain, chronic   . Left scapholunate ligament tear   . Lower extremity edema   . Malnutrition of moderate degree ([place])   . Mass of right side of neck   . Microcytic anemia   . Polio   . Polio   . Radius fracture   . Severe protein-calorie malnutrition ([place])   . Swelling of joint, knee, right   . Type 2 diabetes mellitus, controlled ([place])   . Venous insufficiency     Patient Active Problem List   Diagnosis Date Noted  . Constipation 03/24/2016  . Distal radius fracture,  left 02/29/2016  . Bilateral swelling of feet   . Radius fracture 02/04/2016  . Fall 02/04/2016  . Closed fracture of styloid process of ulna 02/04/2016  . Left scapholunate ligament tear 02/04/2016  . Distal radius fracture 02/04/2016  . Chronic anemia 02/04/2016  . Cognitive decline 02/04/2016  . Cellulitis 01/19/2016  . Swelling of joint, knee, right 10/19/2015  . Venous insufficiency 10/19/2015  . Lower extremity edema 05/24/2015  . Type 2 diabetes mellitus, controlled ([place]) 05/24/2015  . Decreased appetite 05/24/2015  . Malnutrition of moderate degree ([place]) 06/07/2014  . Acute pericarditis, unspecified 06/07/2014  . Hypoglycemia 06/06/2014  . Fever 06/06/2014  . ECG abnormal 06/06/2014  . Knee pain, chronic 06/06/2014  . Mass of right side of neck 06/06/2014  . Severe protein-calorie malnutrition ([place]) 06/06/2014  . Microcytic anemia 06/06/2014    Past Surgical History:  Procedure Laterality Date  . ABDOMINAL HYSTERECTOMY    . OPEN REDUCTION INTERNAL FIXATION (ORIF) DISTAL RADIAL FRACTURE Left 02/29/2016   Procedure: LEFT DISTAL RADIUS REPAIR OF MALUNION WITH OPEN REDUCTION INTERNAL FIXATION (ORIF);  Surgeon: Iran Planas, MD;  Location: [facility];  Service: Orthopedics;  Laterality: Left;    OB History  No data available       Home Medications    Prior to Admission medications   Medication Sig Start Date End Date Taking? Authorizing Provider  docusate sodium (COLACE) 100 MG capsule Take 1 capsule (100 mg total) by mouth 2 (two) times daily. 02/29/16   Iran Planas, MD  Ferrous Sulfate (IRON) 325 (65 Fe) MG TABS Take 325 mg by mouth daily at 12 noon. 02/05/16   Lavina Hamman, MD  furosemide (LASIX) 20 MG tablet Take 1 tablet (20 mg total) by mouth daily. 02/05/16   Lavina Hamman, MD  HYDROcodone-acetaminophen (NORCO/VICODIN) 5-325 MG tablet Take 1 tablet by mouth every 8 (eight) hours as needed for moderate pain. 02/05/16   Lavina Hamman, MD  methocarbamol (ROBAXIN)  500 MG tablet Take 1 tablet (500 mg total) by mouth 4 (four) times daily. 02/29/16   Iran Planas, MD  oxyCODONE-acetaminophen (ROXICET) 5-325 MG tablet Take 1 tablet by mouth every 4 (four) hours as needed for severe pain. 02/29/16   Iran Planas, MD  vitamin C (ASCORBIC ACID) 500 MG tablet Take 1 tablet (500 mg total) by mouth daily. 02/29/16   Iran Planas, MD    Family History Family History  Problem Relation Age of Onset  . Ovarian cancer Mother   . COPD Father   . Hypertension    . High Cholesterol    . Diabetes Mellitus II Neg Hx   . Lupus Neg Hx   . Sarcoidosis Neg Hx     Social History Social History  Substance Use Topics  . Smoking status: Former Smoker    Packs/day: 0.50    Types: Cigarettes    Quit date: 09/09/1968  . Smokeless tobacco: Never Used  . Alcohol use No     Allergies   Orange concentrate [flavoring agent]; Peach [prunus persica]; Penicillins; Strawberry extract; Adhesive [tape]; Aspirin; Latex; Pineapple; and Strawberry flavor   Review of Systems Review of Systems  Constitutional: Negative for fever.  Cardiovascular: Positive for leg swelling.  Skin: Positive for rash.  All other systems reviewed and are negative.    Physical Exam Updated Vital Signs BP 153/83 (BP Location: Left Arm)   Pulse 99   Temp 98.5 F (36.9 C) (Oral)   Resp 17   Ht 5' 7.5" (1.715 m)   Wt 208 lb (94.3 kg)   SpO2 100%   BMI 32.10 kg/m   Physical Exam  Constitutional: She is oriented to person, place, and time. She appears well-developed and well-nourished. No distress.  HENT:  Head: Normocephalic.  Nose: Nose normal.  Eyes: Conjunctivae are normal.  Neck: Neck supple. No tracheal deviation present.  Cardiovascular: Normal rate and regular rhythm.   Pulmonary/Chest: Effort normal. No respiratory distress.  Abdominal: Soft. She exhibits no distension.  Musculoskeletal: She exhibits edema.  3+ BLE pitting edema with erythema  from the ankle to the midcalf with  smoothing out of skin over legs.   Neurological: She is alert and oriented to person, place, and time.  Skin: Skin is warm and dry.  Blistering, erythema with yellow crusting to the dorsum of the right hand that is a patch ~6cm in diameter  Psychiatric: She has a normal mood and affect.  Nursing note and vitals reviewed.    ED Treatments / Results  DIAGNOSTIC STUDIES:  Oxygen Saturation is 100% on RA, normal by my interpretation.    COORDINATION OF CARE:  6:28 PM Discussed treatment plan with pt at bedside and pt agreed to plan.  Labs (all labs ordered are listed, but only abnormal results are displayed) Labs Reviewed  I-STAT CHEM 8, ED - Abnormal; Notable for the following:       Result Value   Glucose, Bld 100 (*)    All other components within normal limits    EKG  EKG Interpretation None       Radiology No results found.  Procedures Procedures (including critical care time)  Medications Ordered in ED Medications  mupirocin cream (BACTROBAN) 2 % (1 application Topical Given 11/13/16 1855)  clindamycin (CLEOCIN) capsule 300 mg (300 mg Oral Given 11/13/16 1853)     Initial Impression / Assessment and Plan / ED Course  I have reviewed the triage vital signs and the nursing notes.  Pertinent labs & imaging results that were available during my care of the patient were reviewed by me and considered in my medical decision making (see chart for details).     71 y.o. female presents with primary complaint of right hand dorsal rash that is consistent with impetigo. Severe penicillin allergic so will treat with topical antibiotics and clindamycin for double coverage. Has secondary complaint of chronic bilateral lower extremity swelling that she has had previously. She appears to have some bilateral stasis dermatitis and questionable early cellulitis which will be covered well by clindamycin. Renal function checked today is normal, will start on Lasix therapy and compression  stockings with elevation recommended for symptomatic relief and primary care follow-up for monitoring progression of symptoms. Doubt DVT given bilateral nature and similar appearance to prior edema from venous stasis with classical clinical features. No fever or other systemic symptoms. No provoking factors for DVT with well's score of -2.  Plan to follow up with PCP as needed and return precautions discussed for worsening or new concerning symptoms.   Final Clinical Impressions(s) / ED Diagnoses   Final diagnoses:  Impetigo  Venous stasis dermatitis of both lower extremities  Peripheral edema    New Prescriptions Discharge Medication List as of 11/13/2016  7:18 PM    START taking these medications   Details  clindamycin (CLEOCIN) 150 MG capsule Take 2 capsules (300 mg total) by mouth 3 (three) times daily., Starting Wed 11/13/2016, Until Wed 11/20/2016, Print       I personally performed the services described in this documentation, which was scribed in my presence. The recorded information has been reviewed and is accurate.     [name], MD 11/13/16 2201

## 2016-11-13 NOTE — ED Triage Notes (Signed)
Pt. Stated, I've had this rash and burning on top of my hand for 4 days. A rash with water blisters on top of hand. Its been there for 4 days.  My skin seems dry all over.

## 2016-12-05 ENCOUNTER — Inpatient Hospital Stay (HOSPITAL_COMMUNITY)
Admission: EM | Admit: 2016-12-05 | Discharge: 2016-12-07 | DRG: 603 | Disposition: A | Discharge status: Home Health | Payer: Medicare Other | Attending: Family Medicine | Admitting: Family Medicine

## 2016-12-05 ENCOUNTER — Encounter (HOSPITAL_COMMUNITY): Payer: Self-pay | Admitting: Emergency Medicine

## 2016-12-05 DIAGNOSIS — Z79899 Other long term (current) drug therapy: Secondary | ICD-10-CM

## 2016-12-05 DIAGNOSIS — R591 Generalized enlarged lymph nodes: Secondary | ICD-10-CM | POA: Diagnosis present

## 2016-12-05 DIAGNOSIS — I10 Essential (primary) hypertension: Secondary | ICD-10-CM | POA: Diagnosis present

## 2016-12-05 DIAGNOSIS — Z90710 Acquired absence of both cervix and uterus: Secondary | ICD-10-CM

## 2016-12-05 DIAGNOSIS — Z825 Family history of asthma and other chronic lower respiratory diseases: Secondary | ICD-10-CM

## 2016-12-05 DIAGNOSIS — Z880 Allergy status to penicillin: Secondary | ICD-10-CM

## 2016-12-05 DIAGNOSIS — J398 Other specified diseases of upper respiratory tract: Secondary | ICD-10-CM | POA: Diagnosis present

## 2016-12-05 DIAGNOSIS — Z888 Allergy status to other drugs, medicaments and biological substances status: Secondary | ICD-10-CM

## 2016-12-05 DIAGNOSIS — R21 Rash and other nonspecific skin eruption: Secondary | ICD-10-CM | POA: Diagnosis present

## 2016-12-05 DIAGNOSIS — M1990 Unspecified osteoarthritis, unspecified site: Secondary | ICD-10-CM | POA: Diagnosis present

## 2016-12-05 DIAGNOSIS — E119 Type 2 diabetes mellitus without complications: Secondary | ICD-10-CM | POA: Diagnosis present

## 2016-12-05 DIAGNOSIS — Z91040 Latex allergy status: Secondary | ICD-10-CM

## 2016-12-05 DIAGNOSIS — D509 Iron deficiency anemia, unspecified: Secondary | ICD-10-CM | POA: Diagnosis present

## 2016-12-05 DIAGNOSIS — Z8249 Family history of ischemic heart disease and other diseases of the circulatory system: Secondary | ICD-10-CM

## 2016-12-05 DIAGNOSIS — Z8041 Family history of malignant neoplasm of ovary: Secondary | ICD-10-CM

## 2016-12-05 DIAGNOSIS — Z9889 Other specified postprocedural states: Secondary | ICD-10-CM

## 2016-12-05 DIAGNOSIS — L0100 Impetigo, unspecified: Principal | ICD-10-CM | POA: Diagnosis present

## 2016-12-05 DIAGNOSIS — Z87891 Personal history of nicotine dependence: Secondary | ICD-10-CM

## 2016-12-05 DIAGNOSIS — R221 Localized swelling, mass and lump, neck: Secondary | ICD-10-CM | POA: Diagnosis present

## 2016-12-05 DIAGNOSIS — R2243 Localized swelling, mass and lump, lower limb, bilateral: Secondary | ICD-10-CM

## 2016-12-05 LAB — CBC WITH DIFFERENTIAL/PLATELET
Basophils Absolute: 0.1 10*3/uL (ref 0.0–0.1)
Basophils Relative: 1 %
EOS PCT: 6 %
Eosinophils Absolute: 0.4 10*3/uL (ref 0.0–0.7)
HCT: 35.3 % — ABNORMAL LOW (ref 36.0–46.0)
Hemoglobin: 10.7 g/dL — ABNORMAL LOW (ref 12.0–15.0)
LYMPHS ABS: 1.8 10*3/uL (ref 0.7–4.0)
Lymphocytes Relative: 30 %
MCH: 22.5 pg — ABNORMAL LOW (ref 26.0–34.0)
MCHC: 30.3 g/dL (ref 30.0–36.0)
MCV: 74.2 fL — ABNORMAL LOW (ref 78.0–100.0)
MONO ABS: 0.5 10*3/uL (ref 0.1–1.0)
Monocytes Relative: 8 %
Neutro Abs: 3.2 10*3/uL (ref 1.7–7.7)
Neutrophils Relative %: 55 %
PLATELETS: 210 10*3/uL (ref 150–400)
RBC: 4.76 MIL/uL (ref 3.87–5.11)
RDW: 15 % (ref 11.5–15.5)
WBC: 6 10*3/uL (ref 4.0–10.5)

## 2016-12-05 LAB — BASIC METABOLIC PANEL
ANION GAP: 6 (ref 5–15)
BUN: 13 mg/dL (ref 6–20)
CALCIUM: 8.5 mg/dL — AB (ref 8.9–10.3)
CO2: 27 mmol/L (ref 22–32)
CREATININE: 0.85 mg/dL (ref 0.44–1.00)
Chloride: 108 mmol/L (ref 101–111)
GFR calc Af Amer: >60 mL/min (ref >60)
Glucose, Bld: 99 mg/dL (ref 65–99)
Potassium: 3.5 mmol/L (ref 3.5–5.1)
Sodium: 141 mmol/L (ref 135–145)

## 2016-12-05 MED ORDER — VANCOMYCIN HCL IN DEXTROSE 1-5 GM/200ML-% IV SOLN
1000.0000 mg | Freq: Once | INTRAVENOUS | Status: AC
  Administered 2016-12-05: 1000 mg via INTRAVENOUS
  Filled 2016-12-05: qty 200

## 2016-12-05 NOTE — ED Triage Notes (Signed)
Pt. Stated, I was here 3 weeks ago for hand irritation and was given Penicillin and now my right hand is worse and Im allergic to Penicillin, Im itching and burning all over. Not sure why I was given Penicillin

## 2016-12-05 NOTE — ED Provider Notes (Signed)
[facility] DEPT Provider Note   CSN: [id number] Arrival date & time: 12/05/16  1643     History   Chief Complaint Chief Complaint  Patient presents with  . Allergic Reaction  . Hand Pain  . Hand Problem  . Pruritis    HPI [name] is a 71 y.o. female.  HPI  71 year old female history of pericarditis, cellulitis, chronic anemia, type 2 diabetes who presents today with worsening rash. She began with some lesions on her right hand several weeks ago but in the past 3-4 hours it has become rapidly spreading and the rash on the right hand has become severe yellow swollen and weeping. She now has lesions on her trunk, left hand, and bilateral lower extremities. She has had some subjective fever but no chills. She denies nausea or vomiting. She was seen here 3 weeks ago and started on clindamycin without relief. She states that the rash is burning but not particularly painful. She does not have primary care physician. Past Medical History:  Diagnosis Date  . Acute pericarditis, unspecified   . Arthritis   . Arthritis   . Bilateral swelling of feet   . Cellulitis   . Chronic anemia   . Chronic knee pain    right  . Closed fracture of styloid process of ulna   . Cognitive decline   . Decreased appetite   . Diabetes mellitus    A1C has been normal for past 2 years and takes no diabetic meds at present  . Distal radius fracture, left   . Distal radius fracture, left   . ECG abnormal   . Fall   . Fever   . Hypertension   . Hypertension   . Hypoglycemia   . Knee pain, chronic   . Left scapholunate ligament tear   . Lower extremity edema   . Malnutrition of moderate degree ([place])   . Mass of right side of neck   . Microcytic anemia   . Polio   . Polio   . Radius fracture   . Severe protein-calorie malnutrition ([place])   . Swelling of joint, knee, right   . Type 2 diabetes mellitus, controlled ([place])   . Venous insufficiency     Patient Active Problem List   Diagnosis Date Noted  . Constipation 03/24/2016  . Distal radius fracture, left 02/29/2016  . Bilateral swelling of feet   . Radius fracture 02/04/2016  . Fall 02/04/2016  . Closed fracture of styloid process of ulna 02/04/2016  . Left scapholunate ligament tear 02/04/2016  . Distal radius fracture 02/04/2016  . Chronic anemia 02/04/2016  . Cognitive decline 02/04/2016  . Cellulitis 01/19/2016  . Swelling of joint, knee, right 10/19/2015  . Venous insufficiency 10/19/2015  . Lower extremity edema 05/24/2015  . Type 2 diabetes mellitus, controlled ([place]) 05/24/2015  . Decreased appetite 05/24/2015  . Malnutrition of moderate degree ([place]) 06/07/2014  . Acute pericarditis, unspecified 06/07/2014  . Hypoglycemia 06/06/2014  . Fever 06/06/2014  . ECG abnormal 06/06/2014  . Knee pain, chronic 06/06/2014  . Mass of right side of neck 06/06/2014  . Severe protein-calorie malnutrition ([place]) 06/06/2014  . Microcytic anemia 06/06/2014    Past Surgical History:  Procedure Laterality Date  . ABDOMINAL HYSTERECTOMY    . OPEN REDUCTION INTERNAL FIXATION (ORIF) DISTAL RADIAL FRACTURE Left 02/29/2016   Procedure: LEFT DISTAL RADIUS REPAIR OF MALUNION WITH OPEN REDUCTION INTERNAL FIXATION (ORIF);  Surgeon: Iran Planas, MD;  Location: [facility];  Service: Orthopedics;  Laterality: Left;    OB History    No data available       Home Medications    Prior to Admission medications   Medication Sig Start Date End Date Taking? Authorizing Provider  furosemide (LASIX) 40 MG tablet Take 1 tablet (40 mg total) by mouth daily. 11/13/16 12/05/16 Yes [name], MD  oxyCODONE-acetaminophen (ROXICET) 5-325 MG tablet Take 1 tablet by mouth every 4 (four) hours as needed for severe pain. 02/29/16  Yes Iran Planas, MD  docusate sodium (COLACE) 100 MG capsule Take 1 capsule (100 mg total) by mouth 2 (two) times daily. Patient not taking: Reported on 12/05/2016 02/29/16   Iran Planas, MD  Ferrous Sulfate (IRON)  325 (65 Fe) MG TABS Take 325 mg by mouth daily at 12 noon. Patient not taking: Reported on 12/05/2016 02/05/16   Lavina Hamman, MD  HYDROcodone-acetaminophen (NORCO/VICODIN) 5-325 MG tablet Take 1 tablet by mouth every 8 (eight) hours as needed for moderate pain. Patient not taking: Reported on 12/05/2016 02/05/16   Lavina Hamman, MD  methocarbamol (ROBAXIN) 500 MG tablet Take 1 tablet (500 mg total) by mouth 4 (four) times daily. Patient not taking: Reported on 12/05/2016 02/29/16   Iran Planas, MD  vitamin C (ASCORBIC ACID) 500 MG tablet Take 1 tablet (500 mg total) by mouth daily. Patient not taking: Reported on 12/05/2016 02/29/16   Iran Planas, MD    Family History Family History  Problem Relation Age of Onset  . Ovarian cancer Mother   . COPD Father   . Hypertension    . High Cholesterol    . Diabetes Mellitus II Neg Hx   . Lupus Neg Hx   . Sarcoidosis Neg Hx     Social History Social History  Substance Use Topics  . Smoking status: Former Smoker    Packs/day: 0.50    Types: Cigarettes    Quit date: 09/09/1968  . Smokeless tobacco: Never Used  . Alcohol use No     Allergies   Orange concentrate [flavoring agent]; Peach [prunus persica]; Penicillins; Strawberry extract; Adhesive [tape]; Aspirin; Latex; Pineapple; and Strawberry flavor   Review of Systems Review of Systems  Constitutional: Positive for fever.  HENT:       Right submandibular swelling that she states has been stable for 5 years  Eyes: Negative.   Respiratory: Negative.   Cardiovascular: Negative.   Gastrointestinal: Negative.   Endocrine: Negative.   Genitourinary: Negative.   Musculoskeletal: Negative.   Skin: Negative.   Neurological: Negative.   Hematological: Negative.   Psychiatric/Behavioral: Negative.   All other systems reviewed and are negative.    Physical Exam Updated Vital Signs BP (!) 141/81   Pulse 89   Temp 98.7 F (37.1 C) (Oral)   Resp 14   Ht 5' 7.5" (1.715 m)   Wt 94.8  kg   SpO2 100%   BMI 32.25 kg/m   Physical Exam  Constitutional: She appears well-developed and well-nourished.  HENT:  Head: Normocephalic and atraumatic.  Right Ear: External ear normal.  Left Ear: External ear normal.  Nose: Nose normal.  Mouth/Throat: Oropharynx is clear and moist.  Eyes: Conjunctivae and EOM are normal. Pupils are equal, round, and reactive to light.  Neck: Normal range of motion. Neck supple.  Cardiovascular: Normal rate, regular rhythm, normal heart sounds and intact distal pulses.   Pulmonary/Chest: Effort normal and breath sounds normal.  Abdominal: Soft. Bowel sounds are normal.  Musculoskeletal: Normal range of motion.  Neurological:  She is alert.  Skin: Capillary refill takes less than 2 seconds.  Diffuse weepy honey crusted lesions of right hand with diffuse swelling up forearm. She has raised lesions of her chest and lower extremities. There is erythema bilateral lower extremities to knees with raised lesions also noted.  Psychiatric: She has a normal mood and affect.  Vitals reviewed.    ED Treatments / Results  Labs (all labs ordered are listed, but only abnormal results are displayed) Labs Reviewed  CBC WITH DIFFERENTIAL/PLATELET - Abnormal; Notable for the following:       Result Value   Hemoglobin 10.7 (*)    HCT 35.3 (*)    MCV 74.2 (*)    MCH 22.5 (*)    All other components within normal limits  BASIC METABOLIC PANEL - Abnormal; Notable for the following:    Calcium 8.5 (*)    All other components within normal limits  CULTURE, BLOOD (ROUTINE X 2)  CULTURE, BLOOD (ROUTINE X 2)    EKG  EKG Interpretation None       Radiology No results found.  Procedures Procedures (including critical care time)  Medications Ordered in ED Medications  vancomycin (VANCOCIN) IVPB 1000 mg/200 mL premix (1,000 mg Intravenous New Bag/Given 12/05/16 2253)     Initial Impression / Assessment and Plan / ED Course  I have reviewed the triage  vital signs and the nursing notes.  Pertinent labs & imaging results that were available during my care of the patient were reviewed by me and considered in my medical decision making (see chart for details).     Diffuse spreading rash with crusted lesions. Lab values are essentially normal. Blood cultures have been sent. She is treated here to cover staph and strep with vancomycin     Awaiting call back from hospitalist. Discussed case with Dr. Christy Gentles. Final Clinical Impressions(s) / ED Diagnoses   Final diagnoses:  Rash    New Prescriptions New Prescriptions   No medications on file     Pattricia Boss, MD 12/06/16 [phone]

## 2016-12-06 ENCOUNTER — Observation Stay (HOSPITAL_COMMUNITY): Payer: Medicare Other

## 2016-12-06 ENCOUNTER — Encounter (HOSPITAL_COMMUNITY): Payer: Self-pay | Admitting: Emergency Medicine

## 2016-12-06 DIAGNOSIS — Z90710 Acquired absence of both cervix and uterus: Secondary | ICD-10-CM | POA: Diagnosis not present

## 2016-12-06 DIAGNOSIS — L0100 Impetigo, unspecified: Secondary | ICD-10-CM | POA: Diagnosis present

## 2016-12-06 DIAGNOSIS — Z79899 Other long term (current) drug therapy: Secondary | ICD-10-CM | POA: Diagnosis not present

## 2016-12-06 DIAGNOSIS — Z880 Allergy status to penicillin: Secondary | ICD-10-CM | POA: Diagnosis not present

## 2016-12-06 DIAGNOSIS — E119 Type 2 diabetes mellitus without complications: Secondary | ICD-10-CM | POA: Diagnosis present

## 2016-12-06 DIAGNOSIS — R21 Rash and other nonspecific skin eruption: Secondary | ICD-10-CM | POA: Diagnosis present

## 2016-12-06 DIAGNOSIS — Z8041 Family history of malignant neoplasm of ovary: Secondary | ICD-10-CM | POA: Diagnosis not present

## 2016-12-06 DIAGNOSIS — Z87891 Personal history of nicotine dependence: Secondary | ICD-10-CM | POA: Diagnosis not present

## 2016-12-06 DIAGNOSIS — D509 Iron deficiency anemia, unspecified: Secondary | ICD-10-CM

## 2016-12-06 DIAGNOSIS — Z888 Allergy status to other drugs, medicaments and biological substances status: Secondary | ICD-10-CM | POA: Diagnosis not present

## 2016-12-06 DIAGNOSIS — R221 Localized swelling, mass and lump, neck: Secondary | ICD-10-CM

## 2016-12-06 DIAGNOSIS — M1990 Unspecified osteoarthritis, unspecified site: Secondary | ICD-10-CM | POA: Diagnosis present

## 2016-12-06 DIAGNOSIS — J398 Other specified diseases of upper respiratory tract: Secondary | ICD-10-CM | POA: Diagnosis present

## 2016-12-06 DIAGNOSIS — Z9889 Other specified postprocedural states: Secondary | ICD-10-CM | POA: Diagnosis not present

## 2016-12-06 DIAGNOSIS — R2243 Localized swelling, mass and lump, lower limb, bilateral: Secondary | ICD-10-CM

## 2016-12-06 DIAGNOSIS — Z91040 Latex allergy status: Secondary | ICD-10-CM | POA: Diagnosis not present

## 2016-12-06 DIAGNOSIS — Z825 Family history of asthma and other chronic lower respiratory diseases: Secondary | ICD-10-CM | POA: Diagnosis not present

## 2016-12-06 DIAGNOSIS — R591 Generalized enlarged lymph nodes: Secondary | ICD-10-CM | POA: Diagnosis present

## 2016-12-06 DIAGNOSIS — I10 Essential (primary) hypertension: Secondary | ICD-10-CM | POA: Diagnosis present

## 2016-12-06 DIAGNOSIS — Z8249 Family history of ischemic heart disease and other diseases of the circulatory system: Secondary | ICD-10-CM | POA: Diagnosis not present

## 2016-12-06 LAB — IRON AND TIBC
IRON: 21 ug/dL — AB (ref 28–170)
SATURATION RATIOS: 12 % (ref 10.4–31.8)
TIBC: 172 ug/dL — AB (ref 250–450)
UIBC: 151 ug/dL

## 2016-12-06 LAB — C-REACTIVE PROTEIN

## 2016-12-06 LAB — SEDIMENTATION RATE: Sed Rate: 12 mm/hr (ref 0–22)

## 2016-12-06 LAB — RAPID HIV SCREEN (HIV 1/2 AB+AG)
HIV 1/2 Antibodies: NONREACTIVE
HIV-1 P24 Antigen - HIV24: NONREACTIVE

## 2016-12-06 LAB — TSH: TSH: 0.733 u[IU]/mL (ref 0.350–4.500)

## 2016-12-06 LAB — RPR: RPR: NONREACTIVE

## 2016-12-06 LAB — BRAIN NATRIURETIC PEPTIDE: B Natriuretic Peptide: 19 pg/mL (ref 0.0–100.0)

## 2016-12-06 MED ORDER — SODIUM CHLORIDE 0.9 % IV SOLN
INTRAVENOUS | Status: DC
  Administered 2016-12-06: 09:00:00 via INTRAVENOUS
  Administered 2016-12-06: 75 mL/h via INTRAVENOUS

## 2016-12-06 MED ORDER — DEXTROSE 5 % IV SOLN
1.0000 g | INTRAVENOUS | Status: DC
  Administered 2016-12-06 – 2016-12-07 (×2): 1 g via INTRAVENOUS
  Filled 2016-12-06 (×2): qty 10

## 2016-12-06 MED ORDER — TRIAMCINOLONE ACETONIDE 0.1 % EX CREA
TOPICAL_CREAM | Freq: Two times a day (BID) | CUTANEOUS | Status: DC
  Administered 2016-12-06 (×2): 1 via TOPICAL
  Administered 2016-12-07: 10:00:00 via TOPICAL
  Filled 2016-12-06: qty 15

## 2016-12-06 MED ORDER — DIPHENHYDRAMINE HCL 50 MG/ML IJ SOLN: 25.0000 mg | Freq: Four times a day (QID) | INTRAMUSCULAR | Status: DC | PRN

## 2016-12-06 MED ORDER — ACETAMINOPHEN 325 MG PO TABS: 650.0000 mg | ORAL_TABLET | Freq: Four times a day (QID) | ORAL | Status: DC | PRN

## 2016-12-06 MED ORDER — ONDANSETRON HCL 4 MG PO TABS: 4.0000 mg | ORAL_TABLET | Freq: Four times a day (QID) | ORAL | Status: DC | PRN

## 2016-12-06 MED ORDER — VANCOMYCIN HCL IN DEXTROSE 1-5 GM/200ML-% IV SOLN
1000.0000 mg | Freq: Two times a day (BID) | INTRAVENOUS | Status: DC
  Administered 2016-12-06 – 2016-12-07 (×2): 1000 mg via INTRAVENOUS
  Filled 2016-12-06 (×3): qty 200

## 2016-12-06 MED ORDER — DIPHENHYDRAMINE HCL 50 MG/ML IJ SOLN
25.0000 mg | Freq: Once | INTRAMUSCULAR | Status: AC
  Administered 2016-12-06: 25 mg via INTRAVENOUS
  Filled 2016-12-06: qty 1

## 2016-12-06 MED ORDER — ENOXAPARIN SODIUM 40 MG/0.4ML ~~LOC~~ SOLN
40.0000 mg | SUBCUTANEOUS | Status: DC
  Administered 2016-12-06 – 2016-12-07 (×2): 40 mg via SUBCUTANEOUS
  Filled 2016-12-06 (×2): qty 0.4

## 2016-12-06 MED ORDER — ACETAMINOPHEN 650 MG RE SUPP: 650.0000 mg | Freq: Four times a day (QID) | RECTAL | Status: DC | PRN

## 2016-12-06 MED ORDER — OXYCODONE-ACETAMINOPHEN 5-325 MG PO TABS
1.0000 | ORAL_TABLET | ORAL | Status: DC | PRN
  Administered 2016-12-06: 1 via ORAL
  Filled 2016-12-06 (×2): qty 1

## 2016-12-06 MED ORDER — ONDANSETRON HCL 4 MG/2ML IJ SOLN: 4.0000 mg | Freq: Four times a day (QID) | INTRAMUSCULAR | Status: DC | PRN

## 2016-12-06 NOTE — H&P (Signed)
History and Physical    Janet BEZDEK AJE:[id number] DOB: 1946/06/24 DOA: 12/05/2016  Referring MD/NP/PA: Dr. Rosalia [name] PCP: Janet Luz, FNP  Patient coming from: Home  Chief Complaint: Worsening rash  HPI: [name] is a 71 y.o. female with medical history significant of HTN, arthritis, anemia, right-sided neck mass, who presents with complaints of a rash that is progressively worsened over the last 3 weeks. Patient initially presented with symptoms of a right hand and was diagnosed at that time with impetigo for which she was discharged home on clindamycin. Rash initially starts as little pustules which will rupture and began weeping yellowish fluid. The rash is itchy and reports intermittent painful burning sensation. The rash has  spread to involve her both arms, back, legs, and chest. Patient does not routinely see her primary care provider.Denies having any significant changes in detergents, soaps, or clothing. Patient does have a lot of allergies, but cannot think of anything that she is coming contact that would make her skin to this.  ED Course: In the ED patient was started on vancomycin and blood cultures were obtained.  Review of Systems: As per HPI otherwise 10 point review of systems negative.   Past Medical History:  Diagnosis Date  . Acute pericarditis, unspecified   . Arthritis   . Arthritis   . Bilateral swelling of feet   . Cellulitis   . Chronic anemia   . Chronic knee pain    right  . Closed fracture of styloid process of ulna   . Cognitive decline   . Decreased appetite   . Diabetes mellitus    A1C has been normal for past 2 years and takes no diabetic meds at present  . Distal radius fracture, left   . Distal radius fracture, left   . ECG abnormal   . Fall   . Fever   . Hypertension   . Hypertension   . Hypoglycemia   . Knee pain, chronic   . Left scapholunate ligament tear   . Lower extremity edema   . Malnutrition of moderate degree ([facility])    . Mass of right side of neck   . Microcytic anemia   . Polio   . Polio   . Radius fracture   . Severe protein-calorie malnutrition ([facility])   . Swelling of joint, knee, right   . Type 2 diabetes mellitus, controlled ([facility])   . Venous insufficiency     Past Surgical History:  Procedure Laterality Date  . ABDOMINAL HYSTERECTOMY    . OPEN REDUCTION INTERNAL FIXATION (ORIF) DISTAL RADIAL FRACTURE Left 02/29/2016   Procedure: LEFT DISTAL RADIUS REPAIR OF MALUNION WITH OPEN REDUCTION INTERNAL FIXATION (ORIF);  Surgeon: Bradly Bienenstock, MD;  Location: [facility] OR;  Service: Orthopedics;  Laterality: Left;     reports that she quit smoking about 48 years ago. Her smoking use included Cigarettes. She smoked 0.50 packs per day. She has never used smokeless tobacco. She reports that she does not drink alcohol or use drugs.  Allergies  Allergen Reactions  . Orange Concentrate [Flavoring Agent] Shortness Of Breath, Itching and Swelling  . Peach [Prunus Persica] Shortness Of Breath, Itching and Swelling  . Penicillins Anaphylaxis and Hives    All 'cillins' Has patient had a PCN reaction causing immediate rash, facial/tongue/throat swelling, SOB or lightheadedness with hypotension: Yes Has patient had a PCN reaction causing severe rash involving mucus membranes or skin necrosis: No Has patient had a PCN reaction that required hospitalization No Has patient  had a PCN reaction occurring within the last 10 years: No If all of the above answers are "NO", then may proceed with Cephalosporin use.   . Strawberry Extract Shortness Of Breath, Itching and Swelling  . Adhesive [Tape] Itching and Rash    Please use "paper" tape  . Aspirin Itching  . Latex Itching and Rash  . Pineapple Itching and Rash  . Strawberry Flavor Itching and Rash    Family History  Problem Relation Age of Onset  . Ovarian cancer Mother   . COPD Father   . Hypertension    . High Cholesterol    . Diabetes Mellitus II Neg Hx   . Lupus  Neg Hx   . Sarcoidosis Neg Hx     Prior to Admission medications   Medication Sig Start Date End Date Taking? Authorizing Provider  furosemide (LASIX) 40 MG tablet Take 1 tablet (40 mg total) by mouth daily. 11/13/16 12/05/16 Yes [name], MD  oxyCODONE-acetaminophen (ROXICET) 5-325 MG tablet Take 1 tablet by mouth every 4 (four) hours as needed for severe pain. 02/29/16  Yes Iran Planas, MD  docusate sodium (COLACE) 100 MG capsule Take 1 capsule (100 mg total) by mouth 2 (two) times daily. Patient not taking: Reported on 12/05/2016 02/29/16   Iran Planas, MD  Ferrous Sulfate (IRON) 325 (65 Fe) MG TABS Take 325 mg by mouth daily at 12 noon. Patient not taking: Reported on 12/05/2016 02/05/16   Lavina Hamman, MD  HYDROcodone-acetaminophen (NORCO/VICODIN) 5-325 MG tablet Take 1 tablet by mouth every 8 (eight) hours as needed for moderate pain. Patient not taking: Reported on 12/05/2016 02/05/16   Lavina Hamman, MD  methocarbamol (ROBAXIN) 500 MG tablet Take 1 tablet (500 mg total) by mouth 4 (four) times daily. Patient not taking: Reported on 12/05/2016 02/29/16   Iran Planas, MD  vitamin C (ASCORBIC ACID) 500 MG tablet Take 1 tablet (500 mg total) by mouth daily. Patient not taking: Reported on 12/05/2016 02/29/16   Iran Planas, MD    Physical Exam:   Constitutional: NAD, calm, comfortable Vitals:   12/05/16 1653 12/05/16 2027 12/05/16 2215 12/06/16 0015  BP:  123/64 (!) 141/81 137/72  Pulse:  96 89 94  Resp:  14    Temp:  98.7 F (37.1 C)    TempSrc:  Oral    SpO2:  99% 100% 100%  Weight: 94.8 kg (209 lb)     Height: 5' 7.5" (1.715 m)      Eyes: PERRL, lids and conjunctivae normal ENMT: Mucous membranes are moist. Posterior pharynx clear of any exudate or lesions.Normal dentition.  Neck: normal, supple, no masses, no thyromegaly Respiratory: clear to auscultation bilaterally, no wheezing, no crackles. Normal respiratory effort. No accessory muscle use.  Cardiovascular: Regular  rate and rhythm, no murmurs / rubs / gallops.+2 pitting lower extremity edema. 2+ pedal pulses. No carotid bruits.  Abdomen: no tenderness, no masses palpated. No hepatosplenomegaly. Bowel sounds positive.  Musculoskeletal: no clubbing / cyanosis. No joint deformity upper and lower extremities. Good ROM, no contractures. Normal muscle tone.  Skin: Diffuse eruption of honey-colored lesions noted of the right hand. With pustular like lesions noted of the side back chest and legs.            Neurologic: CN 2-12 grossly intact. Sensation intact, DTR normal. Strength 5/5 in all 4.  Psychiatric: Normal judgment and insight. Alert and oriented x 3. Normal mood.     Labs on Admission: I have personally reviewed  following labs and imaging studies  CBC:  Recent Labs Lab 12/05/16 2235  WBC 6.0  NEUTROABS 3.2  HGB 10.7*  HCT 35.3*  MCV 74.2*  PLT 951   Basic Metabolic Panel:  Recent Labs Lab 12/05/16 2235  NA 141  K 3.5  CL 108  CO2 27  GLUCOSE 99  BUN 13  CREATININE 0.85  CALCIUM 8.5*   GFR: Estimated Creatinine Clearance: 70.4 mL/min (by C-G formula based on SCr of 0.85 mg/dL). Liver Function Tests: No results for input(s): AST, ALT, ALKPHOS, BILITOT, PROT, ALBUMIN in the last 168 hours. No results for input(s): LIPASE, AMYLASE in the last 168 hours. No results for input(s): AMMONIA in the last 168 hours. Coagulation Profile: No results for input(s): INR, PROTIME in the last 168 hours. Cardiac Enzymes: No results for input(s): CKTOTAL, CKMB, CKMBINDEX, TROPONINI in the last 168 hours. BNP (last 3 results) No results for input(s): PROBNP in the last 8760 hours. HbA1C: No results for input(s): HGBA1C in the last 72 hours. CBG: No results for input(s): GLUCAP in the last 168 hours. Lipid Profile: No results for input(s): CHOL, HDL, LDLCALC, TRIG, CHOLHDL, LDLDIRECT in the last 72 hours. Thyroid Function Tests: No results for input(s): TSH, T4TOTAL, FREET4,  T3FREE, THYROIDAB in the last 72 hours. Anemia Panel: No results for input(s): VITAMINB12, FOLATE, FERRITIN, TIBC, IRON, RETICCTPCT in the last 72 hours. Urine analysis:    Component Value Date/Time   COLORURINE YELLOW 06/06/2014 1311   APPEARANCEUR CLOUDY (A) 06/06/2014 1311   LABSPEC 1.018 06/06/2014 1311   PHURINE 6.0 06/06/2014 1311   GLUCOSEU NEGATIVE 06/06/2014 1311   HGBUR SMALL (A) 06/06/2014 1311   BILIRUBINUR SMALL (A) 06/06/2014 1311   KETONESUR >80 (A) 06/06/2014 1311   PROTEINUR NEGATIVE 06/06/2014 1311   UROBILINOGEN 1.0 06/06/2014 1311   NITRITE NEGATIVE 06/06/2014 1311   LEUKOCYTESUR MODERATE (A) 06/06/2014 1311   Sepsis Labs: No results found for this or any previous visit (from the past 240 hour(s)).   Radiological Exams on Admission: No results found.    Assessment/Plan Rash: Honey crusted lesions noted of the right hand with pustular like lesions noted over the rest of body. Patient on improved with clindamycin. Suspect possibility of severe impetigo. - Admit to MedSurg bed - Follow up blood cultures and wound culture - Empiric antibiotics of vancomycin and  - Check ESR, CRP, HIV, RPR - Wound care consult - Would likely benefit from dermatology  Microcytic anemia  - TIBC and iron level - Check CBC in a.m.  Bilateral lower extremity swelling - Check BNP  Right mandibular lymphadenopathy - Check chest x-ray - Would consider need of biopsy  DVT prophylaxis: Lovenox Code Status: Full Family Communication: Discussed plan of care with the patient and friends present at bedside Disposition Plan: TBD Consults called: none Admission status: Observation  Norval Morton MD [facility] Pager [phone]  If 7PM-7AM, please contact night-coverage [email or web address] Password [email or web address]  12/06/2016, 12:51 AM

## 2016-12-06 NOTE — Progress Notes (Signed)
Pharmacy Antibiotic Note  AHMIYA ABEE is a 71 y.o. female admitted on 12/05/2016 with cellulitis/rash.  Pharmacy has been consulted for Vancomycin dosing. WBC WNL. Renal function ok.   Plan: -Vancomycin 1000 mg IV q12h -Trend WBC, temp, renal function  -Drug levels as indicated   Height: 5\' 7"  (170.2 cm) Weight: 193 lb 9.6 oz (87.8 kg) IBW/kg (Calculated) : 61.6  Temp (24hrs), Avg:98.6 F (37 C), Min:98.2 F (36.8 C), Max:98.9 F (37.2 C)   Recent Labs Lab 12/05/16 2235  WBC 6.0  CREATININE 0.85    Estimated Creatinine Clearance: 67.1 mL/min (by C-G formula based on SCr of 0.85 mg/dL).    Allergies  Allergen Reactions  . Orange Concentrate [Flavoring Agent] Shortness Of Breath, Itching and Swelling  . Peach [Prunus Persica] Shortness Of Breath, Itching and Swelling  . Penicillins Anaphylaxis and Hives    All 'cillins' Has patient had a PCN reaction causing immediate rash, facial/tongue/throat swelling, SOB or lightheadedness with hypotension: Yes Has patient had a PCN reaction causing severe rash involving mucus membranes or skin necrosis: No Has patient had a PCN reaction that required hospitalization No Has patient had a PCN reaction occurring within the last 10 years: No If all of the above answers are "NO", then may proceed with Cephalosporin use.   . Strawberry Extract Shortness Of Breath, Itching and Swelling  . Adhesive [Tape] Itching and Rash    Please use "paper" tape  . Aspirin Itching  . Latex Itching and Rash  . Pineapple Itching and Rash  . Strawberry Flavor Itching and Rash    Narda Bonds 12/06/2016 11:39 PM

## 2016-12-06 NOTE — Consult Note (Addendum)
[facility] Nurse wound consult note Reason for Consult: Consult requested for rash.  Pt and daughter states it began 3 weeks ago and has not improved.  Left lower abd and posterior back with red raised lesions; appearance is consistent with possible shingles.  Vesicles are beginning to dry and scab.  Pt c/o itching and burning. Right arm with scabbed area which extends from elbow to hand.  Dry patches are interspersed with partial thickness open areas with mod yellow drainage; generalized edema to right arm and hand, painful to touch.  Appearance at this site is different than locations to left lower abd and posterior back. Right neck with raised area; pt states "it has been there awhile."  No open wound, drainage, or erythremia. Toenails very long, pt could benefit from podiatrist to trim them. Dressing procedure/placement/frequency: Dermatology is not available at [facility].  Recommend diagnostic test to determine if left lower abd and posterior back are related to shingles.   Xeroform to right arm to promote drying and healing; pt has been started on ABX according to EMR. Pt could benefit from Triamcinolone cream to help decrease the itching from the dry scabbed areas, please order if desired. Pt could benefit from an ultrasound to determine the etiology of the right neck swelling; please order if desired. Air mattress to decrease pressure to affected areas and increase airflow. Discussed plan of care with daughter and patient at the bedside, and also with primary team physician.  They deny further questions. Please re-consult if further assistance is needed.  Thank-you,  Julien Girt MSN, [place], [place], [place], [place]

## 2016-12-06 NOTE — Progress Notes (Addendum)
Patient seen and evaluated earlier this AM by my associate. Please refer to H and P for details. Will try patient on cephalosporin (pt has itching reported to penicillins). Continue Vancomycin. 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 She does not report any increase in size of lipoma.  Will reassess next am.  If improving on cephalosporin will d/c on oral cephalosporin. For today f/u with cx's and continue IV antibiotics  [name]   Vital signs reviewed Gen: pt in nad, alert and awake cv: no cyanosis Pulm: no increased wob, no wheezes

## 2016-12-07 MED ORDER — DOXYCYCLINE HYCLATE 100 MG PO TABS: 100.0000 mg | ORAL_TABLET | Freq: Two times a day (BID) | ORAL | 0 refills | Status: AC

## 2016-12-07 MED ORDER — CEFPODOXIME PROXETIL 200 MG PO TABS: 200.0000 mg | ORAL_TABLET | Freq: Two times a day (BID) | ORAL | 0 refills | Status: AC

## 2016-12-07 NOTE — Discharge Summary (Signed)
Physician Discharge Summary  [name] BLT:[id number] DOB: 1946/04/24 DOA: 12/05/2016  PCP: Mauricio Po, FNP  Admit date: 12/05/2016 Discharge date: 12/07/2016  Time spent: > 35 minutes  Recommendations for Outpatient Follow-up:  1. f/u with primary care physician. Patient reports acute penicillin stating she has itching all over her body. She was placed on Rocephin and did not elicit any side effects. As such will be discharged on third generation oral cephalosporin 2. 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  Hospital Course:  Rash - Suspected to be impetigo patient will be discharged on third generation oral cephalosporin as well as doxycycline. - I have recommended she follow-up with her primary care physician for further monitoring. Will be discharged on 6 more days of antibiotics to complete a seven-day treatment course. I have discussed with patient how I want her to follow-up with her primary care physician before her antibiotic course is completed so that they can decide whether or not to prolong antibiotic regimen. Patient verbalizes understanding and agreement.  For other known medical conditions listed above will continue home medication regimen prior to admission listed below  Procedures:  None  Consultations:  None  Discharge Exam: Vitals:   12/07/16 0414 12/07/16 0945  BP: 124/61 (!)  112/51  Pulse: (!) 112 98  Resp: 16 18  Temp: 99 F (37.2 C) 98.8 F (37.1 C)    General: Pt in nad, alert and awake Cardiovascular: rrr, no rubs Respiratory: no increased wob, no wheezes  Discharge Instructions   Discharge Instructions    Call MD for:  difficulty breathing, headache or visual disturbances    Complete by:  As directed    Call MD for:  severe uncontrolled pain    Complete by:  As directed    Call MD for:  temperature >100.4    Complete by:  As directed    Diet - low sodium heart healthy    Complete by:  As directed    Discharge instructions    Complete by:  As directed    Please follow-up with primary care physician within the next week   Increase activity slowly    Complete by:  As directed      Current Discharge Medication List    START taking these medications   Details  cefpodoxime (VANTIN) 200 MG tablet Take 1 tablet (200 mg total) by mouth 2 (two) times daily. Qty: 12 tablet, Refills: 0    doxycycline (VIBRA-TABS) 100 MG tablet Take 1 tablet (100 mg total) by mouth 2 (two) times daily. Qty: 12 tablet, Refills: 0      CONTINUE these medications which have NOT CHANGED   Details  furosemide (LASIX) 40 MG tablet Take 1 tablet (40 mg total) by mouth daily. Qty: 5 tablet, Refills: 0    oxyCODONE-acetaminophen (ROXICET) 5-325 MG tablet Take 1 tablet by mouth every 4 (four) hours as needed for severe  pain. Qty: 45 tablet, Refills: 0    docusate sodium (COLACE) 100 MG capsule Take 1 capsule (100 mg total) by mouth 2 (two) times daily. Qty: 30 capsule, Refills: 0    Ferrous Sulfate (IRON) 325 (65 Fe) MG TABS Take 325 mg by mouth daily at 12 noon. Qty: 30 each, Refills: 0    HYDROcodone-acetaminophen (NORCO/VICODIN) 5-325 MG tablet Take 1 tablet by mouth every 8 (eight) hours as needed for moderate pain. Qty: 20 tablet, Refills: 0    vitamin C (ASCORBIC ACID) 500 MG tablet Take 1 tablet (500 mg total) by mouth daily. Qty: 50 tablet, Refills: 0       STOP taking these medications     methocarbamol (ROBAXIN) 500 MG tablet        Allergies  Allergen Reactions  . Orange Concentrate [Flavoring Agent] Shortness Of Breath, Itching and Swelling  . Peach [Prunus Persica] Shortness Of Breath, Itching and Swelling  . Penicillins Anaphylaxis and Hives    All 'cillins' Has patient had a PCN reaction causing immediate rash, facial/tongue/throat swelling, SOB or lightheadedness with hypotension: Yes Has patient had a PCN reaction causing severe rash involving mucus membranes or skin necrosis: No Has patient had a PCN reaction that required hospitalization No Has patient had a PCN reaction occurring within the last 10 years: No If all of the above answers are "NO", then may proceed with Cephalosporin use.   . Strawberry Extract Shortness Of Breath, Itching and Swelling  . Adhesive [Tape] Itching and Rash    Please use "paper" tape  . Aspirin Itching  . Latex Itching and Rash  . Pineapple Itching and Rash  . Strawberry Flavor Itching and Rash      The results of significant diagnostics from this hospitalization (including imaging, microbiology, ancillary and laboratory) are listed below for reference.    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 EXAM: PORTABLE CHEST 1 VIEW COMPARISON:  02/04/2016 chest radiograph FINDINGS: Stable normal cardiac silhouette. Aortic atherosclerosis with calcification. Clear lungs. No pleural effusion or pneumothorax. Multilevel degenerative changes of the spine. IMPRESSION: No active disease. Electronically Signed   By: Kristine Garbe M.D.   On: 12/06/2016 02:25    Microbiology: Recent Results (from the past 240 hour(s))  Blood culture (routine x 2)     Status: None (Preliminary result)   Collection Time: 12/05/16 10:35 PM  Result Value Ref Range Status   Specimen Description BLOOD LEFT ARM   Final   Special Requests BOTTLES DRAWN AEROBIC AND ANAEROBIC 5ML  Final   Culture NO GROWTH 2 DAYS  Final   Report Status PENDING  Incomplete  Blood culture (routine x 2)     Status: None (Preliminary result)   Collection Time: 12/05/16 10:40 PM  Result Value Ref Range Status   Specimen Description BLOOD LEFT HAND  Final   Special Requests BOTTLES DRAWN AEROBIC AND ANAEROBIC 5ML  Final   Culture NO GROWTH 2 DAYS  Final   Report Status PENDING  Incomplete  Aerobic Culture (superficial specimen)     Status: None (Preliminary result)   Collection Time: 12/06/16  6:47 AM  Result Value Ref Range Status   Specimen Description WOUND RIGHT ARM  Final   Special Requests NONE  Final   Gram Stain   Final    FEW WBC PRESENT, PREDOMINANTLY PMN RARE SQUAMOUS EPITHELIAL CELLS PRESENT NO ORGANISMS SEEN  Culture NO GROWTH 1 DAY  Final   Report Status PENDING  Incomplete     Labs: Basic Metabolic Panel:  Recent Labs Lab 12/05/16 2235  NA 141  K 3.5  CL 108  CO2 27  GLUCOSE 99  BUN 13  CREATININE 0.85  CALCIUM 8.5*   Liver Function Tests: No results for input(s): AST, ALT, ALKPHOS, BILITOT, PROT, ALBUMIN in the last 168 hours. No results for input(s): LIPASE, AMYLASE in the last 168 hours. No results for input(s): AMMONIA in the last 168 hours. CBC:  Recent Labs Lab 12/05/16 2235  WBC 6.0  NEUTROABS 3.2  HGB 10.7*  HCT 35.3*  MCV 74.2*  PLT 210   Cardiac Enzymes: No results for input(s): CKTOTAL, CKMB, CKMBINDEX, TROPONINI in the last 168 hours. BNP: BNP (last 3 results)  Recent Labs  02/04/16 1409 12/06/16 0209  BNP 23.7 19.0    ProBNP (last 3 results) No results for input(s): PROBNP in the last 8760 hours.  CBG: No results for input(s): GLUCAP in the last 168 hours.  Signed:  Velvet Bathe MD.  [facility] 12/07/2016, 2:06 PM

## 2016-12-07 NOTE — Discharge Instructions (Signed)
Right arm daily change dressing-apply xeroform gauze on oozing part and dressed with kERLIX WRAP AROUND.

## 2016-12-07 NOTE — Care Management Note (Signed)
Case Management Note  Patient Details  Name: [name] MRN: [id number] Date of Birth: Apr 19, 1946  Subjective/Objective:  71 y.o. F to be discharged today home with [facility] for [facility] Care. Spoke with [name] , her daughter who has chosen [facility] to provide this care CM notified [name], the rep for [facility]. Confirmed Home phone and address on face sheet as correct. [name] is listed in Demographic Info and will be the point of contact for this patient.                   Action/Plan:CM will sign off for now but will be available should additional discharge needs arise or disposition change.    Expected Discharge Date:  12/07/16               Expected Discharge Plan:  [place]  In-House Referral:  NA  Discharge planning Services  CM Consult  Post Acute Care Choice:  Home Health Choice offered to:  Adult Children [name])  DME Arranged:  N/A DME Agency:  NA  HH Arranged:  RN (Wound Care) [name]:  [facility] (now [facility])  Status of Service:  Completed, signed off  If discussed at [place] of Stay Meetings, dates discussed:    Additional Comments:  Delrae Sawyers, RN 12/07/2016, 4:21 PM

## 2016-12-08 NOTE — Progress Notes (Signed)
Received message from [facility] that they are unable to accept referral. Contacted [facility] Liaison and they will accept referral. Jonnie Finner RN CCM Case Mgmt phone [phone]

## 2016-12-10 ENCOUNTER — Telehealth: Payer: Self-pay | Admitting: Family

## 2016-12-10 LAB — AEROBIC CULTURE  (SUPERFICIAL SPECIMEN)

## 2016-12-10 LAB — CULTURE, BLOOD (ROUTINE X 2)
CULTURE: NO GROWTH
Culture: NO GROWTH

## 2016-12-10 LAB — AEROBIC CULTURE W GRAM STAIN (SUPERFICIAL SPECIMEN)

## 2016-12-10 NOTE — Telephone Encounter (Signed)
Need Verbal orders to begin skill nurse 12/12/16

## 2016-12-10 NOTE — Telephone Encounter (Signed)
Cannot be given. Needs to come from hospital as patient just discharged.

## 2016-12-10 NOTE — Telephone Encounter (Signed)
Orders should still come from hospital and home health should call hospital for these.

## 2016-12-10 NOTE — Telephone Encounter (Signed)
Made appt for tomorrow

## 2016-12-11 ENCOUNTER — Inpatient Hospital Stay (INDEPENDENT_AMBULATORY_CARE_PROVIDER_SITE_OTHER): Payer: Medicare Other | Admitting: Internal Medicine

## 2016-12-17 ENCOUNTER — Encounter (HOSPITAL_COMMUNITY): Payer: Self-pay | Admitting: Emergency Medicine

## 2016-12-17 ENCOUNTER — Emergency Department (HOSPITAL_COMMUNITY): Payer: Medicare Other

## 2016-12-17 ENCOUNTER — Inpatient Hospital Stay (HOSPITAL_COMMUNITY)
Admission: EM | Admit: 2016-12-17 | Discharge: 2016-12-19 | DRG: 603 | Payer: Medicare Other | Attending: Internal Medicine | Admitting: Internal Medicine

## 2016-12-17 DIAGNOSIS — L0390 Cellulitis, unspecified: Secondary | ICD-10-CM

## 2016-12-17 DIAGNOSIS — E43 Unspecified severe protein-calorie malnutrition: Secondary | ICD-10-CM | POA: Diagnosis not present

## 2016-12-17 DIAGNOSIS — Z87891 Personal history of nicotine dependence: Secondary | ICD-10-CM

## 2016-12-17 DIAGNOSIS — Z880 Allergy status to penicillin: Secondary | ICD-10-CM | POA: Diagnosis not present

## 2016-12-17 DIAGNOSIS — L0100 Impetigo, unspecified: Secondary | ICD-10-CM | POA: Insufficient documentation

## 2016-12-17 DIAGNOSIS — D649 Anemia, unspecified: Secondary | ICD-10-CM | POA: Diagnosis not present

## 2016-12-17 DIAGNOSIS — R627 Adult failure to thrive: Secondary | ICD-10-CM | POA: Diagnosis present

## 2016-12-17 DIAGNOSIS — M1990 Unspecified osteoarthritis, unspecified site: Secondary | ICD-10-CM | POA: Diagnosis not present

## 2016-12-17 DIAGNOSIS — Z8041 Family history of malignant neoplasm of ovary: Secondary | ICD-10-CM | POA: Diagnosis not present

## 2016-12-17 DIAGNOSIS — F0390 Unspecified dementia without behavioral disturbance: Secondary | ICD-10-CM | POA: Diagnosis present

## 2016-12-17 DIAGNOSIS — R600 Localized edema: Secondary | ICD-10-CM | POA: Diagnosis present

## 2016-12-17 DIAGNOSIS — E860 Dehydration: Secondary | ICD-10-CM | POA: Diagnosis not present

## 2016-12-17 DIAGNOSIS — Z8349 Family history of other endocrine, nutritional and metabolic diseases: Secondary | ICD-10-CM | POA: Diagnosis not present

## 2016-12-17 DIAGNOSIS — Z9889 Other specified postprocedural states: Secondary | ICD-10-CM | POA: Diagnosis not present

## 2016-12-17 DIAGNOSIS — G8929 Other chronic pain: Secondary | ICD-10-CM | POA: Diagnosis not present

## 2016-12-17 DIAGNOSIS — L298 Other pruritus: Secondary | ICD-10-CM | POA: Diagnosis not present

## 2016-12-17 DIAGNOSIS — Z91048 Other nonmedicinal substance allergy status: Secondary | ICD-10-CM | POA: Diagnosis not present

## 2016-12-17 DIAGNOSIS — J189 Pneumonia, unspecified organism: Secondary | ICD-10-CM

## 2016-12-17 DIAGNOSIS — I776 Arteritis, unspecified: Secondary | ICD-10-CM | POA: Diagnosis present

## 2016-12-17 DIAGNOSIS — Z8249 Family history of ischemic heart disease and other diseases of the circulatory system: Secondary | ICD-10-CM

## 2016-12-17 DIAGNOSIS — M25561 Pain in right knee: Secondary | ICD-10-CM | POA: Diagnosis present

## 2016-12-17 DIAGNOSIS — I872 Venous insufficiency (chronic) (peripheral): Secondary | ICD-10-CM | POA: Diagnosis not present

## 2016-12-17 DIAGNOSIS — R4189 Other symptoms and signs involving cognitive functions and awareness: Secondary | ICD-10-CM | POA: Diagnosis present

## 2016-12-17 DIAGNOSIS — I10 Essential (primary) hypertension: Secondary | ICD-10-CM | POA: Diagnosis present

## 2016-12-17 DIAGNOSIS — Z79899 Other long term (current) drug therapy: Secondary | ICD-10-CM | POA: Diagnosis not present

## 2016-12-17 DIAGNOSIS — Z90710 Acquired absence of both cervix and uterus: Secondary | ICD-10-CM

## 2016-12-17 DIAGNOSIS — R2243 Localized swelling, mass and lump, lower limb, bilateral: Secondary | ICD-10-CM | POA: Diagnosis present

## 2016-12-17 DIAGNOSIS — R21 Rash and other nonspecific skin eruption: Secondary | ICD-10-CM | POA: Diagnosis not present

## 2016-12-17 DIAGNOSIS — R531 Weakness: Secondary | ICD-10-CM | POA: Diagnosis not present

## 2016-12-17 DIAGNOSIS — Z91040 Latex allergy status: Secondary | ICD-10-CM | POA: Diagnosis not present

## 2016-12-17 DIAGNOSIS — E118 Type 2 diabetes mellitus with unspecified complications: Secondary | ICD-10-CM | POA: Diagnosis present

## 2016-12-17 DIAGNOSIS — L03113 Cellulitis of right upper limb: Secondary | ICD-10-CM | POA: Diagnosis present

## 2016-12-17 DIAGNOSIS — Z886 Allergy status to analgesic agent status: Secondary | ICD-10-CM | POA: Diagnosis not present

## 2016-12-17 DIAGNOSIS — Z91018 Allergy to other foods: Secondary | ICD-10-CM | POA: Diagnosis not present

## 2016-12-17 DIAGNOSIS — Z888 Allergy status to other drugs, medicaments and biological substances status: Secondary | ICD-10-CM | POA: Diagnosis not present

## 2016-12-17 DIAGNOSIS — Z6830 Body mass index (BMI) 30.0-30.9, adult: Secondary | ICD-10-CM | POA: Diagnosis not present

## 2016-12-17 DIAGNOSIS — Z825 Family history of asthma and other chronic lower respiratory diseases: Secondary | ICD-10-CM | POA: Diagnosis not present

## 2016-12-17 DIAGNOSIS — J181 Lobar pneumonia, unspecified organism: Secondary | ICD-10-CM | POA: Diagnosis not present

## 2016-12-17 DIAGNOSIS — B86 Scabies: Secondary | ICD-10-CM | POA: Diagnosis present

## 2016-12-17 LAB — COMPREHENSIVE METABOLIC PANEL
ALK PHOS: 81 U/L (ref 38–126)
ALT: 12 U/L — ABNORMAL LOW (ref 14–54)
ANION GAP: 12 (ref 5–15)
AST: 20 U/L (ref 15–41)
Albumin: 2.9 g/dL — ABNORMAL LOW (ref 3.5–5.0)
BUN: 6 mg/dL (ref 6–20)
CALCIUM: 8.8 mg/dL — AB (ref 8.9–10.3)
CO2: 24 mmol/L (ref 22–32)
Chloride: 102 mmol/L (ref 101–111)
Creatinine, Ser: 0.75 mg/dL (ref 0.44–1.00)
GFR calc non Af Amer: >60 mL/min (ref >60)
Glucose, Bld: 66 mg/dL (ref 65–99)
Potassium: 3.8 mmol/L (ref 3.5–5.1)
Sodium: 138 mmol/L (ref 135–145)
TOTAL PROTEIN: 6.8 g/dL (ref 6.5–8.1)
Total Bilirubin: 1 mg/dL (ref 0.3–1.2)

## 2016-12-17 LAB — CBC WITH DIFFERENTIAL/PLATELET
Basophils Absolute: 0 10*3/uL (ref 0.0–0.1)
Basophils Relative: 0 %
EOS ABS: 0.1 10*3/uL (ref 0.0–0.7)
Eosinophils Relative: 1 %
HCT: 35.5 % — ABNORMAL LOW (ref 36.0–46.0)
HEMOGLOBIN: 11 g/dL — AB (ref 12.0–15.0)
LYMPHS ABS: 1.5 10*3/uL (ref 0.7–4.0)
Lymphocytes Relative: 14 %
MCH: 22.6 pg — AB (ref 26.0–34.0)
MCHC: 31 g/dL (ref 30.0–36.0)
MCV: 73 fL — ABNORMAL LOW (ref 78.0–100.0)
MONO ABS: 0.6 10*3/uL (ref 0.1–1.0)
MONOS PCT: 5 %
Neutro Abs: 9 10*3/uL — ABNORMAL HIGH (ref 1.7–7.7)
Neutrophils Relative %: 80 %
Platelets: 237 10*3/uL (ref 150–400)
RBC: 4.86 MIL/uL (ref 3.87–5.11)
RDW: 15.3 % (ref 11.5–15.5)
WBC: 11.2 10*3/uL — ABNORMAL HIGH (ref 4.0–10.5)

## 2016-12-17 LAB — I-STAT CHEM 8, ED
BUN: 7 mg/dL (ref 6–20)
Calcium, Ion: 1.05 mmol/L — ABNORMAL LOW (ref 1.15–1.40)
Chloride: 103 mmol/L (ref 101–111)
Creatinine, Ser: 0.7 mg/dL (ref 0.44–1.00)
Glucose, Bld: 65 mg/dL (ref 65–99)
HEMATOCRIT: 35 % — AB (ref 36.0–46.0)
Hemoglobin: 11.9 g/dL — ABNORMAL LOW (ref 12.0–15.0)
Potassium: 3.8 mmol/L (ref 3.5–5.1)
SODIUM: 137 mmol/L (ref 135–145)
TCO2: 26 mmol/L (ref 0–100)

## 2016-12-17 LAB — I-STAT TROPONIN, ED
TROPONIN I, POC: 0 ng/mL (ref 0.00–0.08)
TROPONIN I, POC: 0.01 ng/mL (ref 0.00–0.08)
Troponin i, poc: 0 ng/mL (ref 0.00–0.08)

## 2016-12-17 LAB — I-STAT CG4 LACTIC ACID, ED
Lactic Acid, Venous: 1.55 mmol/L (ref 0.5–1.9)
Lactic Acid, Venous: 0.71 mmol/L (ref 0.5–1.9)

## 2016-12-17 LAB — MAGNESIUM: MAGNESIUM: 1.9 mg/dL (ref 1.7–2.4)

## 2016-12-17 LAB — BRAIN NATRIURETIC PEPTIDE: B Natriuretic Peptide: 80.9 pg/mL (ref 0.0–100.0)

## 2016-12-17 MED ORDER — ONDANSETRON HCL 4 MG PO TABS: 4.0000 mg | ORAL_TABLET | Freq: Four times a day (QID) | ORAL | Status: DC | PRN

## 2016-12-17 MED ORDER — SODIUM CHLORIDE 0.9 % IV SOLN: 250.0000 mL | INTRAVENOUS | Status: DC | PRN

## 2016-12-17 MED ORDER — FUROSEMIDE 40 MG PO TABS
40.0000 mg | ORAL_TABLET | Freq: Every day | ORAL | Status: DC
  Administered 2016-12-18 – 2016-12-19 (×2): 40 mg via ORAL
  Filled 2016-12-17 (×2): qty 1

## 2016-12-17 MED ORDER — SODIUM CHLORIDE 0.9 % IV BOLUS (SEPSIS)
1000.0000 mL | Freq: Once | INTRAVENOUS | Status: AC
  Administered 2016-12-17: 1000 mL via INTRAVENOUS

## 2016-12-17 MED ORDER — ACETAMINOPHEN 650 MG RE SUPP: 650.0000 mg | Freq: Four times a day (QID) | RECTAL | Status: DC | PRN

## 2016-12-17 MED ORDER — SODIUM CHLORIDE 0.9% FLUSH: 3.0000 mL | INTRAVENOUS | Status: DC | PRN

## 2016-12-17 MED ORDER — HYDROCODONE-ACETAMINOPHEN 5-325 MG PO TABS
1.0000 | ORAL_TABLET | ORAL | Status: DC | PRN
  Administered 2016-12-19: 1 via ORAL
  Filled 2016-12-17: qty 1

## 2016-12-17 MED ORDER — ACETAMINOPHEN 325 MG PO TABS: 650.0000 mg | ORAL_TABLET | Freq: Four times a day (QID) | ORAL | Status: DC | PRN

## 2016-12-17 MED ORDER — ONDANSETRON HCL 4 MG/2ML IJ SOLN: 4.0000 mg | Freq: Four times a day (QID) | INTRAMUSCULAR | Status: DC | PRN

## 2016-12-17 MED ORDER — DOCUSATE SODIUM 100 MG PO CAPS
100.0000 mg | ORAL_CAPSULE | Freq: Two times a day (BID) | ORAL | Status: DC
  Administered 2016-12-18 – 2016-12-19 (×3): 100 mg via ORAL
  Filled 2016-12-17 (×3): qty 1

## 2016-12-17 MED ORDER — SODIUM CHLORIDE 0.9% FLUSH
3.0000 mL | Freq: Two times a day (BID) | INTRAVENOUS | Status: DC
  Administered 2016-12-18 – 2016-12-19 (×3): 3 mL via INTRAVENOUS

## 2016-12-17 MED ORDER — DEXTROSE 5 % IV SOLN
2.0000 g | Freq: Once | INTRAVENOUS | Status: AC
  Administered 2016-12-17: 2 g via INTRAVENOUS
  Filled 2016-12-17: qty 2

## 2016-12-17 MED ORDER — ENOXAPARIN SODIUM 40 MG/0.4ML ~~LOC~~ SOLN
40.0000 mg | SUBCUTANEOUS | Status: DC
  Administered 2016-12-18 – 2016-12-19 (×2): 40 mg via SUBCUTANEOUS
  Filled 2016-12-17 (×2): qty 0.4

## 2016-12-17 MED ORDER — VANCOMYCIN HCL 10 G IV SOLR
20.0000 mg/kg | Freq: Once | INTRAVENOUS | Status: AC
  Administered 2016-12-17: 1750 mg via INTRAVENOUS
  Filled 2016-12-17: qty 1750

## 2016-12-17 NOTE — H&P (Signed)
[facility] History and Physical  [name] XNA:[id number] DOB: 11/19/1945 DOA: 12/17/2016  Referring physician: Dr Vanita Panda PCP: Mauricio Po, FNP   Chief Complaint:   HPI: [name] is a 71 y.o. female came from home via EMS , original was "stroke call out".  When EMS arrived reportedly stroke scale was negative and pt with gen'd weakness and FTT, Question dementia.  Per ED eval pt was dc'd recently for impetigo on po abx but never got Rx's filled and now has worsening rash of RUE w odor.  Asked to see for admission.    Patient pleasant but vague historian, doesn't recall a lot of details.  Not sure why she is here. Family left a little while ago. No current c/o's.    Denies any CP, SOB, cough, n/v/d, no abd pain. No fevers, chills or sweats.  No family here now.   Pt dc'd on 3/31 10 days ago after admit for R hand/ arm impetigo, also involving the skin of the trunk.  Has known benign R neck mass and venous insuff bilat LE's w chronic LE edema.    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 Ortho injected the knee, po vicodin. 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 DM2, HTN, question UTI.  May 17 - to ER after a fall on their way to ED for bialt LE edema, ulcer on foot.  Fell and injured L hand.  Chronic LE edema, on lasix, but taken off months ago due to improved BP.  +DOE/ orthop. LE edema rx'd with TED stockings, normal US doppler and ABI.  L distal radius fx, conservative Rx per Dr Fredna Dow.  Cognitive decline. PT rec'd SNFP.  dc'd to SNF.  Jun 17 - ORIF and repair of Left distal radius malunion Mar 29- 31, 2018 >  presented with R hand/ arm rash, anemia, DJD and R sided nexk mass, bilat LE swelling. Felt to have impetigo and dc'd on 3rd gen cephalosporin as well as po doxy. Had itching after  rec'd IV pcn   ROS  denies CP  no joint pain   no HA  no blurry vision  no rash  no diarrhea  no nausea/ vomiting  no dysuria  no difficulty voiding  no change in urine color    Past Medical History  Past Medical History:  Diagnosis Date  . Acute pericarditis, unspecified   . Arthritis   . Arthritis   . Bilateral swelling of feet   . Cellulitis   . Chronic anemia   . Chronic knee pain    right  . Closed fracture of styloid process of ulna   . Cognitive decline   . Decreased appetite   . Diabetes mellitus    A1C has been normal for past 2 years and takes no diabetic meds at present  . Distal radius fracture, left   . Distal radius fracture, left   . ECG abnormal   . Fall   . Fever   . Hypertension   . Hypertension   . Hypoglycemia   . Knee pain, chronic   . Left scapholunate ligament tear   . Lower extremity edema   . Malnutrition of moderate degree ([place])   . Mass of right side of neck   . Microcytic anemia   . Polio   .  Polio   . Radius fracture   . Severe protein-calorie malnutrition ([place])   . Swelling of joint, knee, right   . Type 2 diabetes mellitus, controlled ([place])   . Venous insufficiency    Past Surgical History  Past Surgical History:  Procedure Laterality Date  . ABDOMINAL HYSTERECTOMY    . OPEN REDUCTION INTERNAL FIXATION (ORIF) DISTAL RADIAL FRACTURE Left 02/29/2016   Procedure: LEFT DISTAL RADIUS REPAIR OF MALUNION WITH OPEN REDUCTION INTERNAL FIXATION (ORIF);  Surgeon: Iran Planas, MD;  Location: [facility];  Service: Orthopedics;  Laterality: Left;   Family History  Family History  Problem Relation Age of Onset  . Ovarian cancer Mother   . COPD Father   . Hypertension    . High Cholesterol    . Diabetes Mellitus II Neg Hx   . Lupus Neg Hx   . Sarcoidosis Neg Hx    Social History  reports that she quit smoking about 48 years ago. Her smoking use included Cigarettes. She smoked 0.50 packs per day. She has never used smokeless tobacco. She  reports that she does not drink alcohol or use drugs. Allergies  Allergies  Allergen Reactions  . Orange Concentrate [Flavoring Agent] Shortness Of Breath, Itching and Swelling  . Peach [Prunus Persica] Shortness Of Breath, Itching and Swelling  . Penicillins Anaphylaxis and Hives    All 'cillins' Has patient had a PCN reaction causing immediate rash, facial/tongue/throat swelling, SOB or lightheadedness with hypotension: Yes Has patient had a PCN reaction causing severe rash involving mucus membranes or skin necrosis: No Has patient had a PCN reaction that required hospitalization No Has patient had a PCN reaction occurring within the last 10 years: No If all of the above answers are "NO", then may proceed with Cephalosporin use.   . Strawberry Extract Shortness Of Breath, Itching and Swelling  . Adhesive [Tape] Itching and Rash    Please use "paper" tape  . Aspirin Itching  . Latex Itching and Rash  . Other Hives, Itching and Rash    NO "-CILLINS"  . Pineapple Itching and Rash  . Strawberry Flavor Itching and Rash   Home medications Prior to Admission medications   Medication Sig Start Date End Date Taking? Authorizing Provider  Acetaminophen (TYLENOL) 325 MG CAPS Take 325-650 mg by mouth every 6 (six) hours as needed (for pain).   Yes Historical Provider, MD  furosemide (LASIX) 40 MG tablet Take 1 tablet (40 mg total) by mouth daily. 11/13/16 12/17/16 Yes [name], MD  docusate sodium (COLACE) 100 MG capsule Take 1 capsule (100 mg total) by mouth 2 (two) times daily. Patient not taking: Reported on 12/17/2016 02/29/16   Iran Planas, MD  Ferrous Sulfate (IRON) 325 (65 Fe) MG TABS Take 325 mg by mouth daily at 12 noon. Patient not taking: Reported on 12/17/2016 02/05/16   Lavina Hamman, MD  HYDROcodone-acetaminophen (NORCO/VICODIN) 5-325 MG tablet Take 1 tablet by mouth every 8 (eight) hours as needed for moderate pain. Patient not taking: Reported on 12/17/2016 02/05/16   Lavina Hamman, MD  oxyCODONE-acetaminophen (ROXICET) 5-325 MG tablet Take 1 tablet by mouth every 4 (four) hours as needed for severe pain. Patient not taking: Reported on 12/17/2016 02/29/16   Iran Planas, MD  vitamin C (ASCORBIC ACID) 500 MG tablet Take 1 tablet (500 mg total) by mouth daily. Patient not taking: Reported on 12/17/2016 02/29/16   Iran Planas, MD   Liver Function Tests  Recent Labs Lab 12/17/16 1704  AST  20  ALT 12*  ALKPHOS 81  BILITOT 1.0  PROT 6.8  ALBUMIN 2.9*   No results for input(s): LIPASE, AMYLASE in the last 168 hours. CBC  Recent Labs Lab 12/17/16 1704 12/17/16 1726  WBC 11.2*  --   NEUTROABS 9.0*  --   HGB 11.0* 11.9*  HCT 35.5* 35.0*  MCV 73.0*  --   PLT 237  --    Basic Metabolic Panel  Recent Labs Lab 12/17/16 1704 12/17/16 1726  NA 138 137  K 3.8 3.8  CL 102 103  CO2 24  --   GLUCOSE 66 65  BUN 6 7  CREATININE 0.75 0.70  CALCIUM 8.8*  --      Vitals:   12/17/16 1441 12/17/16 1444 12/17/16 1445 12/17/16 1730  BP:  (!) 158/104  (!) 147/83  Pulse:  (!) 112    Resp:  20    Temp:  99.1 F (37.3 C)    TempSrc:  Oral    SpO2: 99% 99%    Weight:   87.5 kg (193 lb)   Height:   5\' 7"  (1.702 m)    Exam: Temp 99  BP 149/79 Gen pleasant, no distress, odor of urine No rash, cyanosis or gangrene Sclera anicteric, throat clear  No jvd or bruits, firm R neck mass size of a golfbal Chest clear bilat RRR no MRG, tachy Abd soft ntnd no mass or ascites +bs GU  defer MS no joint effusions or deformity Bilat chronic lower leg skin changes / darkening with mild R> L pretib edema No open wound/ eschar/ ulcers on LE's R arm marked thickening of skin and darkening of back of hand and distal 50% of forearm, R arm also swollen up to the shoulder and slightly red and warm throughout, not painful, no abscess Neuro is alert, pleasant   Na 137 K 3.8  BUN 6  Cr 0.75   Alb 2.9  LFT"s ok Hb 11.0  WBC 11k  plt 237  CXR (independ reviewed) >  questionable infiltrate R lateral lower chest  Assessment: 1. Cellulitis R arm - worse, didn't fill Rx for po abx after dc from hospital 10 days ago for same issue.  Looks like impetigo.  Doubt PNA, old CXR has similar R lateral density prob diaphragm. No symptoms of PNA.  Will admit, cont Vanc, dc cefipime.  Consider ID input.   2. Dementia - pleasant and converstaional but poor on recollecting details.   3. Chronic LE edema/ venous insuff - takes lasix 40 /d, continue 4. Hx DM2 - off meds > 48yrs 5. HTN - not on BP meds, BP high on presentation but better now 6. Gen'd weakness - multifactorial , primarily due to #1 most likely  Plan - admit, IV abx     [place] D [facility] Pager [phone]   If 7PM-7AM, please contact night-coverage [email or web address] Password [facility] 12/17/2016, 6:46 PM

## 2016-12-17 NOTE — ED Triage Notes (Signed)
Pt in from home via [facility] for original "stroke call out". When EMS arrived, stroke scale was negative and pt had generalized weakness and apparent FTT. Questionable hx of dementia, a&ox3 on ED arrival. Pt has been noncompliant with meds and doc appt's per family. Pt has apparent R hand infection with malodorous smell. VSS, NAD, MAE's equally

## 2016-12-17 NOTE — ED Provider Notes (Signed)
[facility] DEPT Provider Note   CSN: [id number] Arrival date & time: 12/17/16  1435     History   Chief Complaint Chief Complaint  Patient presents with  . Failure To Thrive  . Wound Infection  . Weakness    HPI [name] is a 71 y.o. female.  The history is provided by the patient.  Illness  This is a new problem. The current episode started more than 2 days ago. The problem occurs constantly. The problem has been gradually worsening. Associated symptoms include shortness of breath. Pertinent negatives include no chest pain, no abdominal pain and no headaches. Exacerbated by: activity. The symptoms are relieved by rest. She has tried nothing for the symptoms. The treatment provided no relief.    Past Medical History:  Diagnosis Date  . Acute pericarditis, unspecified   . Arthritis   . Arthritis   . Bilateral swelling of feet   . Cellulitis   . Chronic anemia   . Chronic knee pain    right  . Closed fracture of styloid process of ulna   . Cognitive decline   . Decreased appetite   . Diabetes mellitus    A1C has been normal for past 2 years and takes no diabetic meds at present  . Distal radius fracture, left   . Distal radius fracture, left   . ECG abnormal   . Fall   . Fever   . Hypertension   . Hypertension   . Hypoglycemia   . Knee pain, chronic   . Left scapholunate ligament tear   . Lower extremity edema   . Malnutrition of moderate degree ([place])   . Mass of right side of neck   . Microcytic anemia   . Polio   . Polio   . Radius fracture   . Severe protein-calorie malnutrition ([place])   . Swelling of joint, knee, right   . Type 2 diabetes mellitus, controlled ([place])   . Venous insufficiency     Patient Active Problem List   Diagnosis Date Noted  . Rash of body 12/06/2016  . Localized swelling, mass, or lump of lower extremity, bilateral 12/06/2016  . Constipation 03/24/2016  . Distal radius fracture, left 02/29/2016  . Bilateral  swelling of feet   . Radius fracture 02/04/2016  . Fall 02/04/2016  . Closed fracture of styloid process of ulna 02/04/2016  . Left scapholunate ligament tear 02/04/2016  . Distal radius fracture 02/04/2016  . Chronic anemia 02/04/2016  . Cognitive decline 02/04/2016  . Cellulitis 01/19/2016  . Swelling of joint, knee, right 10/19/2015  . Venous insufficiency 10/19/2015  . Lower extremity edema 05/24/2015  . Type 2 diabetes mellitus, controlled ([place]) 05/24/2015  . Decreased appetite 05/24/2015  . Malnutrition of moderate degree ([place]) 06/07/2014  . Acute pericarditis, unspecified 06/07/2014  . Hypoglycemia 06/06/2014  . Fever 06/06/2014  . ECG abnormal 06/06/2014  . Knee pain, chronic 06/06/2014  . Mass of right side of neck 06/06/2014  . Severe protein-calorie malnutrition ([place]) 06/06/2014  . Microcytic anemia 06/06/2014    Past Surgical History:  Procedure Laterality Date  . ABDOMINAL HYSTERECTOMY    . OPEN REDUCTION INTERNAL FIXATION (ORIF) DISTAL RADIAL FRACTURE Left 02/29/2016   Procedure: LEFT DISTAL RADIUS REPAIR OF MALUNION WITH OPEN REDUCTION INTERNAL FIXATION (ORIF);  Surgeon: Iran Planas, MD;  Location: [facility];  Service: Orthopedics;  Laterality: Left;    OB History    No data available       Home Medications  Prior to Admission medications   Medication Sig Start Date End Date Taking? Authorizing Provider  Acetaminophen (TYLENOL) 325 MG CAPS Take 325-650 mg by mouth every 6 (six) hours as needed (for pain).   Yes Historical Provider, MD  furosemide (LASIX) 40 MG tablet Take 1 tablet (40 mg total) by mouth daily. 11/13/16 12/17/16 Yes [name], MD  docusate sodium (COLACE) 100 MG capsule Take 1 capsule (100 mg total) by mouth 2 (two) times daily. Patient not taking: Reported on 12/17/2016 02/29/16   Iran Planas, MD  Ferrous Sulfate (IRON) 325 (65 Fe) MG TABS Take 325 mg by mouth daily at 12 noon. Patient not taking: Reported on 12/17/2016 02/05/16   Lavina Hamman, MD  HYDROcodone-acetaminophen (NORCO/VICODIN) 5-325 MG tablet Take 1 tablet by mouth every 8 (eight) hours as needed for moderate pain. Patient not taking: Reported on 12/17/2016 02/05/16   Lavina Hamman, MD  oxyCODONE-acetaminophen (ROXICET) 5-325 MG tablet Take 1 tablet by mouth every 4 (four) hours as needed for severe pain. Patient not taking: Reported on 12/17/2016 02/29/16   Iran Planas, MD  vitamin C (ASCORBIC ACID) 500 MG tablet Take 1 tablet (500 mg total) by mouth daily. Patient not taking: Reported on 12/17/2016 02/29/16   Iran Planas, MD    Family History Family History  Problem Relation Age of Onset  . Ovarian cancer Mother   . COPD Father   . Hypertension    . High Cholesterol    . Diabetes Mellitus II Neg Hx   . Lupus Neg Hx   . Sarcoidosis Neg Hx     Social History Social History  Substance Use Topics  . Smoking status: Former Smoker    Packs/day: 0.50    Types: Cigarettes    Quit date: 09/09/1968  . Smokeless tobacco: Never Used  . Alcohol use No     Allergies   Orange concentrate [flavoring agent]; Peach [prunus persica]; Penicillins; Strawberry extract; Adhesive [tape]; Aspirin; Latex; Other; Pineapple; and Strawberry flavor   Review of Systems Review of Systems  Constitutional: Positive for activity change, appetite change, chills and fatigue. Negative for fever.  HENT: Negative for ear pain and sore throat.   Eyes: Negative for pain and visual disturbance.  Respiratory: Positive for shortness of breath. Negative for cough.   Cardiovascular: Negative for chest pain and palpitations.  Gastrointestinal: Negative for abdominal pain and vomiting.  Genitourinary: Negative for dysuria and hematuria.  Musculoskeletal: Negative for arthralgias and back pain.  Skin: Positive for wound (wound worsening to right hand). Negative for color change and rash.  Neurological: Positive for weakness and light-headedness. Negative for seizures, syncope, facial  asymmetry and headaches.  Psychiatric/Behavioral: The patient is not nervous/anxious.   All other systems reviewed and are negative.    Physical Exam Updated Vital Signs BP (!) 147/83   Pulse (!) 112   Temp 99.1 F (37.3 C) (Oral)   Resp 20   Ht 5\' 7"  (1.702 m)   Wt 87.5 kg   SpO2 99%   BMI 30.23 kg/m   Physical Exam  Constitutional: She appears well-developed and well-nourished. She has a sickly appearance. She appears ill. No distress.  HENT:  Head: Normocephalic and atraumatic.  Mouth/Throat: Mucous membranes are dry.  Eyes: Conjunctivae and EOM are normal.  Neck: Neck supple.  Cardiovascular: Regular rhythm.  Tachycardia present.   No murmur heard. Pulmonary/Chest: Effort normal and breath sounds normal. No respiratory distress.  Abdominal: Soft. There is no tenderness.  Musculoskeletal: She exhibits  no edema.  Neurological: She is alert. She has normal strength. No cranial nerve deficit or sensory deficit. GCS eye subscore is 4. GCS verbal subscore is 5. GCS motor subscore is 6.  Skin: Skin is warm and dry.     Psychiatric: She has a normal mood and affect.  Pt having trouble recalling exact plan for her healthcare  Nursing note and vitals reviewed.    ED Treatments / Results  Labs (all labs ordered are listed, but only abnormal results are displayed) Labs Reviewed  CBC WITH DIFFERENTIAL/PLATELET - Abnormal; Notable for the following:       Result Value   WBC 11.2 (*)    Hemoglobin 11.0 (*)    HCT 35.5 (*)    MCV 73.0 (*)    MCH 22.6 (*)    Neutro Abs 9.0 (*)    All other components within normal limits  COMPREHENSIVE METABOLIC PANEL - Abnormal; Notable for the following:    Calcium 8.8 (*)    Albumin 2.9 (*)    ALT 12 (*)    All other components within normal limits  I-STAT CHEM 8, ED - Abnormal; Notable for the following:    Calcium, Ion 1.05 (*)    Hemoglobin 11.9 (*)    HCT 35.0 (*)    All other components within normal limits  CULTURE, BLOOD  (ROUTINE X 2)  CULTURE, BLOOD (ROUTINE X 2)  BRAIN NATRIURETIC PEPTIDE  MAGNESIUM  URINALYSIS, ROUTINE W REFLEX MICROSCOPIC  I-STAT CG4 LACTIC ACID, ED  I-STAT TROPOININ, ED  I-STAT CG4 LACTIC ACID, ED  I-STAT TROPOININ, ED    EKG  EKG Interpretation None       Radiology Dg Chest 2 View  Result Date: 12/17/2016 CLINICAL DATA:  Failure to thrive, fatigue for 2 months, history hypertension, diabetes mellitus, polio, cognitive decline, former smoker EXAM: CHEST  2 VIEW COMPARISON:  12/06/2016 FINDINGS: Normal heart size and pulmonary vascularity. Calcified tortuous thoracic aorta. RIGHT lower lobe infiltrate with small RIGHT pleural effusion. Remaining lungs clear. No pneumothorax. Bones demineralized with LEFT glenohumeral degenerative changes noted. Deformity at the proximal LEFT humerus where a lytic lesion with surrounding well-defined sclerotic rim is identified, stable since 02/17/2009, likely benign. IMPRESSION: RIGHT lower lobe pneumonia with small RIGHT pleural effusion. Aortic atherosclerosis. Electronically Signed   By: Lavonia Dana M.D.   On: 12/17/2016 16:32    Procedures Procedures (including critical care time)  Medications Ordered in ED Medications  vancomycin (VANCOCIN) 1,750 mg in sodium chloride 0.9 % 500 mL IVPB (1,750 mg Intravenous New Bag/Given 12/17/16 1741)  sodium chloride 0.9 % bolus 1,000 mL (1,000 mLs Intravenous New Bag/Given 12/17/16 1717)  ceFEPIme (MAXIPIME) 2 g in dextrose 5 % 50 mL IVPB (0 g Intravenous Stopped 12/17/16 1742)     Initial Impression / Assessment and Plan / ED Course  I have reviewed the triage vital signs and the nursing notes.  Pertinent labs & imaging results that were available during my care of the patient were reviewed by me and considered in my medical decision making (see chart for details).     71 year old female with history of hypertension, pleuritis, right-sided neck mass and presents in the setting of weakness,  shortness of breath, worsening skin wounds. Patient was seen several weeks ago and diagnosed with impetigo and discharged home with outpatient home health nurse. Patient reports since that time she has had intermittent wound care and has not been taking antibiotics due to cost. Patient reports decreased by mouth intake  and reports some mild shortness of breath and worsening generalized weakness.  On arrival patient was hemodynamically stable and afebrile but noted to be tachycardic. On examination patient appears dehydrated clinically including dry mucous membranes. Gauze over impetigo on right arm is adhered to skin and required soaking to be able to take off. Continued honey crusting weeping large lesion of her right arm. Patient with additional lesions appearing B impetigo on left lower extremity. Patient additionally reports she's had difficulty ambulating due to generalized weakness. Chest x-ray consistent with pneumonia and patient given vancomycin and cefepime. Additionally this medication will likely treat skin infection. Patient given IV fluids and no other significant laboratory abnormalities other than an elevated white blood cell count noted. Patient will require admission to medicine at this time for further management of skin wound, pneumonia, failure to thrive. Family does not feel patient is safe at home and current setting and that she may be better served in long-term care. They believe she is unable to care for herself at this time.  Patient stable at time of transfer of care and on reassessment tachycardia improving.  Final Clinical Impressions(s) / ED Diagnoses   Final diagnoses:  Failure to thrive in adult  Impetigo  Cellulitis, unspecified cellulitis site  Community acquired pneumonia of right lower lobe of lung ([place])  Dehydration  Generalized weakness    New Prescriptions New Prescriptions   No medications on file     Esaw Grandchild, MD 12/17/16 1904    Carmin Muskrat, MD 12/17/16 [phone]

## 2016-12-17 NOTE — ED Notes (Signed)
Patient transported to X-ray 

## 2016-12-17 NOTE — Progress Notes (Signed)
Pharmacy Antibiotic Note  [name] is a 71 y.o. female admitted on 12/17/2016 with cellulitis.  Pharmacy has been consulted for Vancomycin dosing. WBC 11.2. Renal function ok.   Plan: Vancomycin 1250 mg IV q24h Trend WBC, temp, renal function  F/U infectious work-up Drug levels as indicated   Height: 5\' 7"  (170.2 cm) Weight: 187 lb 9.8 oz (85.1 kg) IBW/kg (Calculated) : 61.6  Temp (24hrs), Avg:99.4 F (37.4 C), Min:99.1 F (37.3 C), Max:99.7 F (37.6 C)   Recent Labs Lab 12/17/16 1704 12/17/16 1726 12/17/16 1946  WBC 11.2*  --   --   CREATININE 0.75 0.70  --   LATICACIDVEN  --  1.55 0.71    Estimated Creatinine Clearance: 70.2 mL/min (by C-G formula based on SCr of 0.7 mg/dL).    Allergies  Allergen Reactions  . Orange Concentrate [Flavoring Agent] Shortness Of Breath, Itching and Swelling  . Peach [Prunus Persica] Shortness Of Breath, Itching and Swelling  . Penicillins Anaphylaxis and Hives    All 'cillins' Has patient had a PCN reaction causing immediate rash, facial/tongue/throat swelling, SOB or lightheadedness with hypotension: Yes Has patient had a PCN reaction causing severe rash involving mucus membranes or skin necrosis: No Has patient had a PCN reaction that required hospitalization No Has patient had a PCN reaction occurring within the last 10 years: No If all of the above answers are "NO", then may proceed with Cephalosporin use.   . Strawberry Extract Shortness Of Breath, Itching and Swelling  . Adhesive [Tape] Itching and Rash    Please use "paper" tape  . Aspirin Itching  . Latex Itching and Rash  . Other Hives, Itching and Rash    NO "-CILLINS"  . Pineapple Itching and Rash  . Strawberry Flavor Itching and Rash    Narda Bonds 12/17/2016 11:59 PM

## 2016-12-18 DIAGNOSIS — Z91048 Other nonmedicinal substance allergy status: Secondary | ICD-10-CM

## 2016-12-18 DIAGNOSIS — Z8041 Family history of malignant neoplasm of ovary: Secondary | ICD-10-CM

## 2016-12-18 DIAGNOSIS — Z87891 Personal history of nicotine dependence: Secondary | ICD-10-CM

## 2016-12-18 DIAGNOSIS — Z886 Allergy status to analgesic agent status: Secondary | ICD-10-CM

## 2016-12-18 DIAGNOSIS — L298 Other pruritus: Secondary | ICD-10-CM

## 2016-12-18 DIAGNOSIS — Z8349 Family history of other endocrine, nutritional and metabolic diseases: Secondary | ICD-10-CM

## 2016-12-18 DIAGNOSIS — L0390 Cellulitis, unspecified: Secondary | ICD-10-CM

## 2016-12-18 DIAGNOSIS — R4189 Other symptoms and signs involving cognitive functions and awareness: Secondary | ICD-10-CM

## 2016-12-18 DIAGNOSIS — Z880 Allergy status to penicillin: Secondary | ICD-10-CM

## 2016-12-18 DIAGNOSIS — E860 Dehydration: Secondary | ICD-10-CM

## 2016-12-18 DIAGNOSIS — J181 Lobar pneumonia, unspecified organism: Secondary | ICD-10-CM

## 2016-12-18 DIAGNOSIS — Z8249 Family history of ischemic heart disease and other diseases of the circulatory system: Secondary | ICD-10-CM

## 2016-12-18 DIAGNOSIS — Z91040 Latex allergy status: Secondary | ICD-10-CM

## 2016-12-18 DIAGNOSIS — B86 Scabies: Secondary | ICD-10-CM | POA: Diagnosis present

## 2016-12-18 DIAGNOSIS — Z91018 Allergy to other foods: Secondary | ICD-10-CM

## 2016-12-18 DIAGNOSIS — J189 Pneumonia, unspecified organism: Secondary | ICD-10-CM

## 2016-12-18 DIAGNOSIS — Z825 Family history of asthma and other chronic lower respiratory diseases: Secondary | ICD-10-CM

## 2016-12-18 DIAGNOSIS — L03113 Cellulitis of right upper limb: Principal | ICD-10-CM

## 2016-12-18 LAB — URINALYSIS, ROUTINE W REFLEX MICROSCOPIC
BILIRUBIN URINE: NEGATIVE
GLUCOSE, UA: NEGATIVE mg/dL
KETONES UR: 20 mg/dL — AB
LEUKOCYTES UA: NEGATIVE
Nitrite: NEGATIVE
PH: 5 (ref 5.0–8.0)
Protein, ur: NEGATIVE mg/dL
Specific Gravity, Urine: 1.01 (ref 1.005–1.030)

## 2016-12-18 LAB — MRSA PCR SCREENING: MRSA BY PCR: POSITIVE — AB

## 2016-12-18 LAB — BASIC METABOLIC PANEL
Anion gap: 11 (ref 5–15)
BUN: 6 mg/dL (ref 6–20)
CALCIUM: 8.3 mg/dL — AB (ref 8.9–10.3)
CO2: 23 mmol/L (ref 22–32)
CREATININE: 0.71 mg/dL (ref 0.44–1.00)
Chloride: 105 mmol/L (ref 101–111)
GFR calc Af Amer: >60 mL/min (ref >60)
GLUCOSE: 78 mg/dL (ref 65–99)
Potassium: 3.6 mmol/L (ref 3.5–5.1)
Sodium: 139 mmol/L (ref 135–145)

## 2016-12-18 LAB — CBC
HCT: 34.5 % — ABNORMAL LOW (ref 36.0–46.0)
Hemoglobin: 10.9 g/dL — ABNORMAL LOW (ref 12.0–15.0)
MCH: 23.1 pg — ABNORMAL LOW (ref 26.0–34.0)
MCHC: 31.6 g/dL (ref 30.0–36.0)
MCV: 73.1 fL — ABNORMAL LOW (ref 78.0–100.0)
Platelets: 198 10*3/uL (ref 150–400)
RBC: 4.72 MIL/uL (ref 3.87–5.11)
RDW: 15.4 % (ref 11.5–15.5)
WBC: 7.8 10*3/uL (ref 4.0–10.5)

## 2016-12-18 MED ORDER — MUPIROCIN 2 % EX OINT
1.0000 "application " | TOPICAL_OINTMENT | Freq: Two times a day (BID) | CUTANEOUS | Status: DC
  Administered 2016-12-18 – 2016-12-19 (×3): 1 via NASAL
  Filled 2016-12-18: qty 22

## 2016-12-18 MED ORDER — CHLORHEXIDINE GLUCONATE CLOTH 2 % EX PADS
6.0000 | MEDICATED_PAD | Freq: Every day | CUTANEOUS | Status: DC
  Administered 2016-12-18 – 2016-12-19 (×2): 6 via TOPICAL

## 2016-12-18 MED ORDER — VANCOMYCIN HCL 10 G IV SOLR: 1250.0000 mg | INTRAVENOUS | Status: DC

## 2016-12-18 NOTE — Plan of Care (Addendum)
Reviewed ID recommendations by Dr. Drucilla Schmidt to transfer to hospital with dermatology specialty. [facility] hospital physician transfer line, currently NO beds available at all. I was recommended to call later or tomorrow.  [place], gave all the patient information. They requested face sheet to be faxed to [phone]. Awaiting call back from the physician if patient can be accepted.   Estill Cotta M.D. [facility] 12/18/2016, 2:15 PM  Pager: [phone]  Addendum Spoke with Dr Kerby Less at [facility], patient is accepted but there are currently no beds available. [facility] will call the floor once the bed is available. Will also call [facility] in am if the bed is available.    Estill Cotta M.D. [facility] 12/18/2016, 3:00 PM  Pager: [phone]

## 2016-12-18 NOTE — Progress Notes (Signed)
Patient admitted from ED via stretcher, Alert and oriented x 4. Vital signs stable. Right upper arm cellulitis and multiple darkened spots on lower extremities,trunk and arms. Denies pain at this time. Oriented to room and call bell.

## 2016-12-18 NOTE — Progress Notes (Addendum)
[facility]                                                                              Patient Demographics  [name], is a 71 y.o. female, DOB - 02-07-1946, JJH:[id number]  Admit date - 12/17/2016   Admitting Physician Roney Jaffe, MD  Outpatient Primary MD for the patient is Mauricio Po, FNP  Outpatient specialists:   LOS - 1  days    Chief Complaint  Patient presents with  . Failure To Thrive  . Wound Infection  . Weakness       Brief summary   Patient is a 71 year old female with dementia, chronic anemia, diabetes mellitus, hypertension, malnutrition, chronic right-sided knee pain from arthritis presented to ED with generalized weakness and failure to thrive. History was difficult to obtain from the patient. Per ED evaluation, patient was recently discharged on 3/31 with impetigo of the right hand/arm on oral antibiotics but she never got the prescription filled, now returned with worsening rash on the right upper extremity with foul-smelling odor. Has known benign R neck mass and venous insuff bilat LE's w chronic LE edema.    Assessment & Plan    Principal Problem:   Cellulitis of right arm: Worse, was recently discharged from the hospital on doxycycline and vantin but did not fill up the prescription - Patient was started on vancomycin, obtain infectious disease consult, discussed with Dr. Drucilla Schmidt - MRSA PCR screen positive - Follow Doppler ultrasound of the right arm  Active Problems:  History of hypertension - Not on any medications   Venous insufficiency of both lower extremities, chronic  - Patient was placed on Lasix 40 mg daily  Dementia with Cognitive decline - Currently not on any medications  Diabetes mellitus type 2 - Diet controlled Patient has been off her medications for more than 2 years   Code Status: Full CODE STATUS  DVT Prophylaxis:  Lovenox Family Communication: Discussed in detail with the patient, all  imaging results, lab results explained to the patient no family member present at the bedside currently.     Disposition Plan:   Time Spent in minutes  25 minutes  Procedures:    Consultants:   Infectious disease  Antimicrobials:   Vancomycin 4/10>   Medications  Scheduled Meds: . Chlorhexidine Gluconate Cloth  6 each Topical Q0600  . docusate sodium  100 mg Oral BID  . enoxaparin (LOVENOX) injection  40 mg Subcutaneous Q24H  . furosemide  40 mg Oral Daily  . mupirocin ointment  1 application Nasal BID  . sodium chloride flush  3 mL Intravenous Q12H  . [START ON 12/19/2016] vancomycin  1,250 mg Intravenous Q24H   Continuous Infusions: PRN Meds:.sodium chloride, acetaminophen **OR** acetaminophen, HYDROcodone-acetaminophen, ondansetron **OR** ondansetron (ZOFRAN) IV, sodium chloride flush   Antibiotics   Anti-infectives    Start     Dose/Rate Route Frequency Ordered Stop   12/19/16 1800  vancomycin (VANCOCIN) 1,250 mg in sodium chloride 0.9 % 250 mL IVPB     1,250 mg 166.7 mL/hr over 90 Minutes Intravenous Every 24 hours 12/18/16 0007     12/17/16 1715  vancomycin (  VANCOCIN) 1,750 mg in sodium chloride 0.9 % 500 mL IVPB     20 mg/kg  87.5 kg 250 mL/hr over 120 Minutes Intravenous  Once 12/17/16 1711 12/17/16 2241   12/17/16 1715  ceFEPIme (MAXIPIME) 2 g in dextrose 5 % 50 mL IVPB     2 g 100 mL/hr over 30 Minutes Intravenous  Once 12/17/16 1711 12/17/16 1742        Subjective:   [name] was seen and examined today. States her right arm was hurting at the time of admission however currently feels better.  Patient denies dizziness, chest pain, shortness of breath, abdominal pain, N/V/D/C, new weakness, numbess, tingling. low-grade temp 99 F  Objective:   Vitals:   12/17/16 2045 12/17/16 2358 12/18/16 0453 12/18/16 0500  BP: 129/77 (!) 141/74 125/79   Pulse: (!) 114 (!) 115 (!) 113   Resp:  18 17   Temp:  99.7 F (37.6 C) 99.7 F (37.6 C)     TempSrc:  Oral Oral   SpO2: 97% 98% 98%   Weight:  85.1 kg (187 lb 9.8 oz)  84.8 kg (187 lb)  Height:  5\' 7"  (1.702 m)      Intake/Output Summary (Last 24 hours) at 12/18/16 1057 Last data filed at 12/18/16 0600  Gross per 24 hour  Intake               50 ml  Output              302 ml  Net             -252 ml     Wt Readings from Last 3 Encounters:  12/18/16 84.8 kg (187 lb)  12/06/16 87.8 kg (193 lb 9.6 oz)  11/13/16 94.3 kg (208 lb)     Exam  General: Alert and oriented , NAD  HEENT:  PERRLA, EOMI, Anicteric Sclera, mucous membranes moist.   Neck: Supple, no JVD, no masses  Cardiovascular: S1 S2 auscultated, no rubs, murmurs or gallops. Regular rate and rhythm.  Respiratory: Clear to auscultation bilaterally, no wheezing, rales or rhonchi  Gastrointestinal: Soft, nontender, nondistended, + bowel sounds  Ext: no cyanosis clubbing. Bilateral chronic lower extremity venous changes   Neuro: , Cr N's II- XII. Strength 5/5 upper and lower extremities bilaterally  Skin: Right arm with marked thickening of the skin, scaly and swollen, foul-smelling odor,   Psych: Normal affect and demeanor, alert and oriented    Data Reviewed:  I have personally reviewed following labs and imaging studies  Micro Results Recent Results (from the past 240 hour(s))  Blood culture (routine x 2)     Status: None (Preliminary result)   Collection Time: 12/17/16  5:00 PM  Result Value Ref Range Status   Specimen Description BLOOD LEFT ANTECUBITAL  Final   Special Requests   Final    BOTTLES DRAWN AEROBIC AND ANAEROBIC Blood Culture adequate volume   Culture NO GROWTH < 24 HOURS  Final   Report Status PENDING  Incomplete  Blood culture (routine x 2)     Status: None (Preliminary result)   Collection Time: 12/17/16  5:06 PM  Result Value Ref Range Status   Specimen Description BLOOD LEFT HAND  Final   Special Requests IN PEDIATRIC BOTTLE Blood Culture adequate volume  Final   Culture  NO GROWTH < 24 HOURS  Final   Report Status PENDING  Incomplete  MRSA PCR Screening     Status: Abnormal   Collection  Time: 12/18/16 12:26 AM  Result Value Ref Range Status   MRSA by PCR POSITIVE (A) NEGATIVE Final    Comment:        The GeneXpert MRSA Assay (FDA approved for NASAL specimens only), is one component of a comprehensive MRSA colonization surveillance program. It is not intended to diagnose MRSA infection nor to guide or monitor treatment for MRSA infections. RESULT CALLED TO, READ BACK BY AND VERIFIED WITH: NANCY CAGUIOA,RN @0434  12/18/16 MKELLY,MLT     Radiology Reports Dg Chest 2 View  Result Date: 12/17/2016 CLINICAL DATA:  Failure to thrive, fatigue for 2 months, history hypertension, diabetes mellitus, polio, cognitive decline, former smoker EXAM: CHEST  2 VIEW COMPARISON:  12/06/2016 FINDINGS: Normal heart size and pulmonary vascularity. Calcified tortuous thoracic aorta. RIGHT lower lobe infiltrate with small RIGHT pleural effusion. Remaining lungs clear. No pneumothorax. Bones demineralized with LEFT glenohumeral degenerative changes noted. Deformity at the proximal LEFT humerus where a lytic lesion with surrounding well-defined sclerotic rim is identified, stable since 02/17/2009, likely benign. IMPRESSION: RIGHT lower lobe pneumonia with small RIGHT pleural effusion. Aortic atherosclerosis. Electronically Signed   By: Lavonia Dana M.D.   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 EXAM: PORTABLE CHEST 1 VIEW COMPARISON:  02/04/2016 chest radiograph FINDINGS: Stable normal cardiac silhouette. Aortic atherosclerosis with calcification. Clear lungs. No pleural effusion or pneumothorax. Multilevel degenerative changes of the spine. IMPRESSION: No active disease. Electronically Signed   By: Kristine Garbe M.D.   On: 12/06/2016 02:25    Lab Data:  CBC:  Recent  Labs Lab 12/17/16 1704 12/17/16 1726 12/18/16 0701  WBC 11.2*  --  7.8  NEUTROABS 9.0*  --   --   HGB 11.0* 11.9* 10.9*  HCT 35.5* 35.0* 34.5*  MCV 73.0*  --  73.1*  PLT 237  --  297   Basic Metabolic Panel:  Recent Labs Lab 12/17/16 1704 12/17/16 1726 12/18/16 0701  NA 138 137 139  K 3.8 3.8 3.6  CL 102 103 105  CO2 24  --  23  GLUCOSE 66 65 78  BUN 6 7 6   CREATININE 0.75 0.70 0.71  CALCIUM 8.8*  --  8.3*  MG 1.9  --   --    GFR: Estimated Creatinine Clearance: 70.1 mL/min (by C-G formula based on SCr of 0.71 mg/dL). Liver Function Tests:  Recent Labs Lab 12/17/16 1704  AST 20  ALT 12*  ALKPHOS 81  BILITOT 1.0  PROT 6.8  ALBUMIN 2.9*   No results for input(s): LIPASE, AMYLASE in the last 168 hours. No results for input(s): AMMONIA in the last 168 hours. Coagulation Profile: No results for input(s): INR, PROTIME in the last 168 hours. Cardiac Enzymes: No results for input(s): CKTOTAL, CKMB, CKMBINDEX, TROPONINI in the last 168 hours. BNP (last 3 results) No results for input(s): PROBNP in the last 8760 hours. HbA1C: No results for input(s): HGBA1C in the last 72 hours. CBG: No results for input(s): GLUCAP in the last 168 hours. Lipid Profile: No results for input(s): CHOL, HDL, LDLCALC, TRIG, CHOLHDL, LDLDIRECT in the last 72 hours. Thyroid Function Tests: No results for input(s): TSH, T4TOTAL, FREET4, T3FREE, THYROIDAB in the last 72 hours. Anemia Panel: No results for input(s): VITAMINB12, FOLATE, FERRITIN, TIBC, IRON, RETICCTPCT in the last 72 hours. Urine analysis:    Component Value Date/Time   COLORURINE YELLOW 12/18/2016 9892  APPEARANCEUR HAZY (A) 12/18/2016 0626   LABSPEC 1.010 12/18/2016 0626   PHURINE 5.0 12/18/2016 0626   GLUCOSEU NEGATIVE 12/18/2016 0626   HGBUR SMALL (A) 12/18/2016 0626   BILIRUBINUR NEGATIVE 12/18/2016 0626   KETONESUR 20 (A) 12/18/2016 0626   PROTEINUR NEGATIVE 12/18/2016 0626   UROBILINOGEN 1.0 06/06/2014 1311    NITRITE NEGATIVE 12/18/2016 0626   LEUKOCYTESUR NEGATIVE 12/18/2016 0626     Alfonsa Vaile M.D. [facility] 12/18/2016, 10:57 AM  Pager: [phone] Between 7am to 7pm - call Pager - [phone]  After 7pm go to [email or web address] - password [email or web address]  Call night coverage person covering after 7pm

## 2016-12-18 NOTE — Consult Note (Addendum)
Date of Admission:  12/17/2016  Date of Consult:  12/18/2016  Reason for Consult: Rash, and cellulitis Referring Physician: Dr. Tana Coast   HPI: [name] is an 71 y.o. female who had apparently called 911 and was concerned about a stroke. When EMS arrived there is no evidence of stroke but she did have generalized weakness. There is question of underlying dementia. Patient was brought to the emerge department. She had recently been hospitalized on the hospitalist service and been treated for a "cellulitis. She had been discharged with oral doxycycline and Augmentin prescriptions which she had not filled. Apparently she was now having worsening of her right upper extremity rash and there was a malodorous material with it. She's been admitted and placed on vancomycin.  She also was given cefepime which is now been discontinued.  On exam she has an intense scaling rash across most of her arm and most prominently her hand. She also has it on the opposite arm as well as on areas in her back and her legs. She tells me that this is been present for the past year but been worsening in the interval. She denies any history of cocaine use or any history of vasculitis she has not been seen by dermatologist. I'm not sure if it is been evaluated by primary care either. To my eye it looks to be either severe ezcematous process vs possible scaling [place] scabies though the patient is not known to be immune compromised   Past Medical History:  Diagnosis Date  . Acute pericarditis, unspecified   . Arthritis   . Arthritis   . Bilateral swelling of feet   . Cellulitis   . Chronic anemia   . Chronic knee pain    right  . Closed fracture of styloid process of ulna   . Cognitive decline   . Decreased appetite   . Diabetes mellitus    A1C has been normal for past 2 years and takes no diabetic meds at present  . Distal radius fracture, left   . Distal radius fracture, left   . ECG abnormal     . Fall   . Fever   . Hypertension   . Hypertension   . Hypoglycemia   . Knee pain, chronic   . Left scapholunate ligament tear   . Lower extremity edema   . Malnutrition of moderate degree ([place])   . Mass of right side of neck   . Microcytic anemia   . Polio   . Polio   . Radius fracture   . Severe protein-calorie malnutrition ([place])   . Swelling of joint, knee, right   . Type 2 diabetes mellitus, controlled ([place])   . Venous insufficiency     Past Surgical History:  Procedure Laterality Date  . ABDOMINAL HYSTERECTOMY    . OPEN REDUCTION INTERNAL FIXATION (ORIF) DISTAL RADIAL FRACTURE Left 02/29/2016   Procedure: LEFT DISTAL RADIUS REPAIR OF MALUNION WITH OPEN REDUCTION INTERNAL FIXATION (ORIF);  Surgeon: Iran Planas, MD;  Location: [facility];  Service: Orthopedics;  Laterality: Left;    Social History:  reports that she quit smoking about 48 years ago. Her smoking use included Cigarettes. She smoked 0.50 packs per day. She has never used smokeless tobacco. She reports that she does not drink alcohol or use drugs.   Family History  Problem Relation Age of Onset  . Ovarian cancer Mother   . COPD Father   . Hypertension    .  High Cholesterol    . Diabetes Mellitus II Neg Hx   . Lupus Neg Hx   . Sarcoidosis Neg Hx     Allergies  Allergen Reactions  . Orange Concentrate [Flavoring Agent] Shortness Of Breath, Itching and Swelling  . Peach [Prunus Persica] Shortness Of Breath, Itching and Swelling  . Penicillins Anaphylaxis and Hives    All 'cillins' Has patient had a PCN reaction causing immediate rash, facial/tongue/throat swelling, SOB or lightheadedness with hypotension: Yes Has patient had a PCN reaction causing severe rash involving mucus membranes or skin necrosis: No Has patient had a PCN reaction that required hospitalization No Has patient had a PCN reaction occurring within the last 10 years: No If all of the above answers are "NO", then may proceed with  Cephalosporin use.   . Strawberry Extract Shortness Of Breath, Itching and Swelling  . Adhesive [Tape] Itching and Rash    Please use "paper" tape  . Aspirin Itching  . Latex Itching and Rash  . Other Hives, Itching and Rash    NO "-CILLINS"  . Pineapple Itching and Rash  . Strawberry Flavor Itching and Rash     Medications: I have reviewed patients current medications as documented in Epic Anti-infectives    Start     Dose/Rate Route Frequency Ordered Stop   12/19/16 1800  vancomycin (VANCOCIN) 1,250 mg in sodium chloride 0.9 % 250 mL IVPB  Status:  Discontinued     1,250 mg 166.7 mL/hr over 90 Minutes Intravenous Every 24 hours 12/18/16 0007 12/18/16 1317   12/17/16 1715  vancomycin (VANCOCIN) 1,750 mg in sodium chloride 0.9 % 500 mL IVPB     20 mg/kg  87.5 kg 250 mL/hr over 120 Minutes Intravenous  Once 12/17/16 1711 12/17/16 2241   12/17/16 1715  ceFEPIme (MAXIPIME) 2 g in dextrose 5 % 50 mL IVPB     2 g 100 mL/hr over 30 Minutes Intravenous  Once 12/17/16 1711 12/17/16 1742         ROS: * as in HPI otherwise remainder of 12 point Review of Systems is not obtainable due to patient's confusion   Blood pressure 138/77, pulse (!) 106, temperature 99.1 F (37.3 C), temperature source Oral, resp. rate 19, height '5\' 7"'$  (1.702 m), weight 187 lb (84.8 kg), SpO2 99 %. General: Alert and awake, oriented x person, place location   HEENT: anicteric sclera,  EOMI, oropharynx clear and without exudate Cardiovascular:r egular rate, normal r,  no murmur rubs or gallops Pulmonary:no wheezing, and no respiratory distress Gastrointestinal: soft nontender, nondistended, normal bowel sounds,  Skin, soft tissue: no rashes:  Extensive scaling rash with some malodorous material on the right arm see pictures below  12/18/16:  Right arm dorsal and hand     Right hand     Right volar area of arm     Left arm      Back    Leg    Legs and  feet           Neuro: nonfocal, strength and sensation intact   Results for orders placed or performed during the hospital encounter of 12/17/16 (from the past 48 hour(s))  Blood culture (routine x 2)     Status: None (Preliminary result)   Collection Time: 12/17/16  5:00 PM  Result Value Ref Range   Specimen Description BLOOD LEFT ANTECUBITAL    Special Requests      BOTTLES DRAWN AEROBIC AND ANAEROBIC Blood Culture adequate volume   Culture  NO GROWTH < 24 HOURS    Report Status PENDING   CBC with Differential     Status: Abnormal   Collection Time: 12/17/16  5:04 PM  Result Value Ref Range   WBC 11.2 (H) 4.0 - 10.5 K/uL   RBC 4.86 3.87 - 5.11 MIL/uL   Hemoglobin 11.0 (L) 12.0 - 15.0 g/dL   HCT 35.5 (L) 36.0 - 46.0 %   MCV 73.0 (L) 78.0 - 100.0 fL   MCH 22.6 (L) 26.0 - 34.0 pg   MCHC 31.0 30.0 - 36.0 g/dL   RDW 15.3 11.5 - 15.5 %   Platelets 237 150 - 400 K/uL   Neutrophils Relative % 80 %   Neutro Abs 9.0 (H) 1.7 - 7.7 K/uL   Lymphocytes Relative 14 %   Lymphs Abs 1.5 0.7 - 4.0 K/uL   Monocytes Relative 5 %   Monocytes Absolute 0.6 0.1 - 1.0 K/uL   Eosinophils Relative 1 %   Eosinophils Absolute 0.1 0.0 - 0.7 K/uL   Basophils Relative 0 %   Basophils Absolute 0.0 0.0 - 0.1 K/uL  Comprehensive metabolic panel     Status: Abnormal   Collection Time: 12/17/16  5:04 PM  Result Value Ref Range   Sodium 138 135 - 145 mmol/L   Potassium 3.8 3.5 - 5.1 mmol/L   Chloride 102 101 - 111 mmol/L   CO2 24 22 - 32 mmol/L   Glucose, Bld 66 65 - 99 mg/dL   BUN 6 6 - 20 mg/dL   Creatinine, Ser 0.75 0.44 - 1.00 mg/dL   Calcium 8.8 (L) 8.9 - 10.3 mg/dL   Total Protein 6.8 6.5 - 8.1 g/dL   Albumin 2.9 (L) 3.5 - 5.0 g/dL   AST 20 15 - 41 U/L   ALT 12 (L) 14 - 54 U/L   Alkaline Phosphatase 81 38 - 126 U/L   Total Bilirubin 1.0 0.3 - 1.2 mg/dL   GFR calc non Af Amer >60 >60 mL/min   GFR calc Af Amer >60 >60 mL/min    Comment: (NOTE) The eGFR has been calculated using the CKD  EPI equation. This calculation has not been validated in all clinical situations. eGFR's persistently <60 mL/min signify possible Chronic Kidney Disease.    Anion gap 12 5 - 15  Brain natriuretic peptide     Status: None   Collection Time: 12/17/16  5:04 PM  Result Value Ref Range   B Natriuretic Peptide 80.9 0.0 - 100.0 pg/mL  Magnesium     Status: None   Collection Time: 12/17/16  5:04 PM  Result Value Ref Range   Magnesium 1.9 1.7 - 2.4 mg/dL  Blood culture (routine x 2)     Status: None (Preliminary result)   Collection Time: 12/17/16  5:06 PM  Result Value Ref Range   Specimen Description BLOOD LEFT HAND    Special Requests IN PEDIATRIC BOTTLE Blood Culture adequate volume    Culture NO GROWTH < 24 HOURS    Report Status PENDING   I-Stat Troponin, ED - 0, 3, 6 hours (not at [facility])     Status: None   Collection Time: 12/17/16  5:24 PM  Result Value Ref Range   Troponin i, poc 0.00 0.00 - 0.08 ng/mL   Comment 3            Comment: Due to the release kinetics of cTnI, a negative result within the first hours of the onset of symptoms does not rule out myocardial  infarction with certainty. If myocardial infarction is still suspected, repeat the test at appropriate intervals.   I-Stat CG4 Lactic Acid, ED     Status: None   Collection Time: 12/17/16  5:26 PM  Result Value Ref Range   Lactic Acid, Venous 1.55 0.5 - 1.9 mmol/L  I-Stat Chem 8, ED     Status: Abnormal   Collection Time: 12/17/16  5:26 PM  Result Value Ref Range   Sodium 137 135 - 145 mmol/L   Potassium 3.8 3.5 - 5.1 mmol/L   Chloride 103 101 - 111 mmol/L   BUN 7 6 - 20 mg/dL   Creatinine, Ser 0.70 0.44 - 1.00 mg/dL   Glucose, Bld 65 65 - 99 mg/dL   Calcium, Ion 1.05 (L) 1.15 - 1.40 mmol/L   TCO2 26 0 - 100 mmol/L   Hemoglobin 11.9 (L) 12.0 - 15.0 g/dL   HCT 35.0 (L) 36.0 - 46.0 %  I-Stat Troponin, ED - 0, 3, 6 hours (not at [facility])     Status: None   Collection Time: 12/17/16  7:44 PM  Result Value Ref Range    Troponin i, poc 0.01 0.00 - 0.08 ng/mL   Comment 3            Comment: Due to the release kinetics of cTnI, a negative result within the first hours of the onset of symptoms does not rule out myocardial infarction with certainty. If myocardial infarction is still suspected, repeat the test at appropriate intervals.   I-Stat CG4 Lactic Acid, ED     Status: None   Collection Time: 12/17/16  7:46 PM  Result Value Ref Range   Lactic Acid, Venous 0.71 0.5 - 1.9 mmol/L  I-Stat Troponin, ED - 0, 3, 6 hours (not at [facility])     Status: None   Collection Time: 12/17/16  9:34 PM  Result Value Ref Range   Troponin i, poc 0.00 0.00 - 0.08 ng/mL   Comment 3            Comment: Due to the release kinetics of cTnI, a negative result within the first hours of the onset of symptoms does not rule out myocardial infarction with certainty. If myocardial infarction is still suspected, repeat the test at appropriate intervals.   MRSA PCR Screening     Status: Abnormal   Collection Time: 12/18/16 12:26 AM  Result Value Ref Range   MRSA by PCR POSITIVE (A) NEGATIVE    Comment:        The GeneXpert MRSA Assay (FDA approved for NASAL specimens only), is one component of a comprehensive MRSA colonization surveillance program. It is not intended to diagnose MRSA infection nor to guide or monitor treatment for MRSA infections. RESULT CALLED TO, READ BACK BY AND VERIFIED WITH: NANCY CAGUIOA,RN '@0434'$  12/18/16 MKELLY,MLT   Urinalysis, Routine w reflex microscopic     Status: Abnormal   Collection Time: 12/18/16  6:26 AM  Result Value Ref Range   Color, Urine YELLOW YELLOW   APPearance HAZY (A) CLEAR   Specific Gravity, Urine 1.010 1.005 - 1.030   pH 5.0 5.0 - 8.0   Glucose, UA NEGATIVE NEGATIVE mg/dL   Hgb urine dipstick SMALL (A) NEGATIVE   Bilirubin Urine NEGATIVE NEGATIVE   Ketones, ur 20 (A) NEGATIVE mg/dL   Protein, ur NEGATIVE NEGATIVE mg/dL   Nitrite NEGATIVE NEGATIVE   Leukocytes, UA  NEGATIVE NEGATIVE   RBC / HPF 0-5 0 - 5 RBC/hpf   WBC, UA 0-5  0 - 5 WBC/hpf   Bacteria, UA RARE (A) NONE SEEN   Squamous Epithelial / LPF 0-5 (A) NONE SEEN   Mucous PRESENT   Basic metabolic panel     Status: Abnormal   Collection Time: 12/18/16  7:01 AM  Result Value Ref Range   Sodium 139 135 - 145 mmol/L   Potassium 3.6 3.5 - 5.1 mmol/L   Chloride 105 101 - 111 mmol/L   CO2 23 22 - 32 mmol/L   Glucose, Bld 78 65 - 99 mg/dL   BUN 6 6 - 20 mg/dL   Creatinine, Ser 0.71 0.44 - 1.00 mg/dL   Calcium 8.3 (L) 8.9 - 10.3 mg/dL   GFR calc non Af Amer >60 >60 mL/min   GFR calc Af Amer >60 >60 mL/min    Comment: (NOTE) The eGFR has been calculated using the CKD EPI equation. This calculation has not been validated in all clinical situations. eGFR's persistently <60 mL/min signify possible Chronic Kidney Disease.    Anion gap 11 5 - 15  CBC     Status: Abnormal   Collection Time: 12/18/16  7:01 AM  Result Value Ref Range   WBC 7.8 4.0 - 10.5 K/uL   RBC 4.72 3.87 - 5.11 MIL/uL   Hemoglobin 10.9 (L) 12.0 - 15.0 g/dL   HCT 34.5 (L) 36.0 - 46.0 %   MCV 73.1 (L) 78.0 - 100.0 fL   MCH 23.1 (L) 26.0 - 34.0 pg   MCHC 31.6 30.0 - 36.0 g/dL   RDW 15.4 11.5 - 15.5 %   Platelets 198 150 - 400 K/uL   '@BRIEFLABTABLE'$ (sdes,specrequest,cult,reptstatus)   ) Recent Results (from the past 720 hour(s))  Blood culture (routine x 2)     Status: None   Collection Time: 12/05/16 10:35 PM  Result Value Ref Range Status   Specimen Description BLOOD LEFT ARM  Final   Special Requests BOTTLES DRAWN AEROBIC AND ANAEROBIC 5ML  Final   Culture NO GROWTH 5 DAYS  Final   Report Status 12/10/2016 FINAL  Final  Blood culture (routine x 2)     Status: None   Collection Time: 12/05/16 10:40 PM  Result Value Ref Range Status   Specimen Description BLOOD LEFT HAND  Final   Special Requests BOTTLES DRAWN AEROBIC AND ANAEROBIC 5ML  Final   Culture NO GROWTH 5 DAYS  Final   Report Status 12/10/2016 FINAL  Final   Aerobic Culture (superficial specimen)     Status: None   Collection Time: 12/06/16  6:47 AM  Result Value Ref Range Status   Specimen Description WOUND RIGHT ARM  Final   Special Requests NONE  Final   Gram Stain   Final    FEW WBC PRESENT, PREDOMINANTLY PMN RARE SQUAMOUS EPITHELIAL CELLS PRESENT NO ORGANISMS SEEN    Culture RARE METHICILLIN RESISTANT STAPHYLOCOCCUS AUREUS  Final   Report Status 12/10/2016 FINAL  Final   Organism ID, Bacteria METHICILLIN RESISTANT STAPHYLOCOCCUS AUREUS  Final      Susceptibility   Methicillin resistant staphylococcus aureus - MIC*    CIPROFLOXACIN >=8 RESISTANT Resistant     ERYTHROMYCIN >=8 RESISTANT Resistant     GENTAMICIN <=0.5 SENSITIVE Sensitive     OXACILLIN RESISTANT Resistant     TETRACYCLINE <=1 SENSITIVE Sensitive     VANCOMYCIN <=0.5 SENSITIVE Sensitive     TRIMETH/SULFA <=10 SENSITIVE Sensitive     CLINDAMYCIN <=0.25 SENSITIVE Sensitive     RIFAMPIN <=0.5 SENSITIVE Sensitive     Inducible Clindamycin  NEGATIVE Sensitive     * RARE METHICILLIN RESISTANT STAPHYLOCOCCUS AUREUS  Blood culture (routine x 2)     Status: None (Preliminary result)   Collection Time: 12/17/16  5:00 PM  Result Value Ref Range Status   Specimen Description BLOOD LEFT ANTECUBITAL  Final   Special Requests   Final    BOTTLES DRAWN AEROBIC AND ANAEROBIC Blood Culture adequate volume   Culture NO GROWTH < 24 HOURS  Final   Report Status PENDING  Incomplete  Blood culture (routine x 2)     Status: None (Preliminary result)   Collection Time: 12/17/16  5:06 PM  Result Value Ref Range Status   Specimen Description BLOOD LEFT HAND  Final   Special Requests IN PEDIATRIC BOTTLE Blood Culture adequate volume  Final   Culture NO GROWTH < 24 HOURS  Final   Report Status PENDING  Incomplete  MRSA PCR Screening     Status: Abnormal   Collection Time: 12/18/16 12:26 AM  Result Value Ref Range Status   MRSA by PCR POSITIVE (A) NEGATIVE Final    Comment:        The  GeneXpert MRSA Assay (FDA approved for NASAL specimens only), is one component of a comprehensive MRSA colonization surveillance program. It is not intended to diagnose MRSA infection nor to guide or monitor treatment for MRSA infections. RESULT CALLED TO, READ BACK BY AND VERIFIED WITH: NANCY CAGUIOA,RN '@0434'$  12/18/16 MKELLY,MLT      Impression/Recommendation  Principal Problem:   [place] scabies Active Problems:   Lower extremity edema   Venous insufficiency of both lower extremities, chronic   Cellulitis of right arm   Cognitive decline   [name] is a 71 y.o. female with  Extensive scaling pruritic rash on arms worse than legs, as well as back:  #1 Scaling extensive rash:  This is either a severe dermatological problem such severe ezcematous  Problem, vasculitis or it could be variant of scabies, possibly [place] scabies  --I will send RPR, HTLV antibodies, ANCA, ANA, ESR, CRP --would recommend punch biopsy and skin scrapings to look for scabies and dermatopath --this is a reason why patients such as her should be admitted to hospitals with dermatology consultants.  I would see if images can be shared with a dermatologist. Certainly could also consider transport to center with expertise though the patient does not appear fairly ill.  We could try to treat her with topical permethrin along with oral ivermectin in case she does have [place] scabies  I would continue contact precautions and her sheets and linens should be washed with hot water on the presumption that she might have scabies.  Perhaps the daughter has more insight into what has been done before.    12/18/2016, 1:20 PM   Thank you so much for this interesting consult  [place] [place] [phone] (pager) [phone] (office) 12/18/2016, 1:20 PM  Rhina Br[place] 12/18/2016, 1:20 PM    ADDENDUM: SPOKE WITH DAUGHTER  Patient has been  largely confined to the house. NO ONE ELSE with a rash or pruritis including children, grandchildren. Pt largely only visited by relatives. She has had rash for months and been in and out of the hospital for "cellulitis"  THis does not sound epidemiologically like scabies to me but would use those precautions.   Would recommend transfer to hospital with dermatology expertise.   I spent greater than 80  minutes with the patient including greater than 50%  of time in face to face counsel of the patient disucssion with daughter her skin problems, fttt, and in coordination of her care.

## 2016-12-19 ENCOUNTER — Inpatient Hospital Stay (HOSPITAL_COMMUNITY): Payer: Medicare Other

## 2016-12-19 DIAGNOSIS — R21 Rash and other nonspecific skin eruption: Secondary | ICD-10-CM

## 2016-12-19 DIAGNOSIS — L0390 Cellulitis, unspecified: Secondary | ICD-10-CM

## 2016-12-19 LAB — RAPID URINE DRUG SCREEN, HOSP PERFORMED
AMPHETAMINES: NOT DETECTED
BENZODIAZEPINES: NOT DETECTED
Barbiturates: NOT DETECTED
COCAINE: NOT DETECTED
OPIATES: NOT DETECTED
Tetrahydrocannabinol: NOT DETECTED

## 2016-12-19 LAB — C-REACTIVE PROTEIN: CRP: 6.9 mg/dL — ABNORMAL HIGH (ref <1.0)

## 2016-12-19 LAB — RPR: RPR: NONREACTIVE

## 2016-12-19 LAB — SJOGRENS SYNDROME-B EXTRACTABLE NUCLEAR ANTIBODY

## 2016-12-19 LAB — SEDIMENTATION RATE: Sed Rate: 45 mm/hr — ABNORMAL HIGH (ref 0–22)

## 2016-12-19 LAB — SJOGRENS SYNDROME-A EXTRACTABLE NUCLEAR ANTIBODY: SSA (Ro) (ENA) Antibody, IgG: <0.2 AI (ref 0.0–0.9)

## 2016-12-19 LAB — ANTINUCLEAR ANTIBODIES, IFA: ANA Ab, IFA: NEGATIVE

## 2016-12-19 LAB — HTLV I+II ANTIBODIES, (EIA), BLD: HTLV I/II Ab: NEGATIVE

## 2016-12-19 MED ORDER — FUROSEMIDE 40 MG PO TABS: 40.0000 mg | ORAL_TABLET | Freq: Every day | ORAL | 0 refills | Status: DC

## 2016-12-19 MED ORDER — MUPIROCIN 2 % EX OINT: 1.0000 "application " | TOPICAL_OINTMENT | Freq: Two times a day (BID) | CUTANEOUS | 0 refills | Status: DC

## 2016-12-19 NOTE — Progress Notes (Signed)
VASCULAR LAB PRELIMINARY  PRELIMINARY  PRELIMINARY  PRELIMINARY  Right upper extremity venous duplex completed.    Preliminary report:  There is no DVT or SVT noted in the right upper extremity.   Sahalie Beth, RVT 12/19/2016, 2:51 PM

## 2016-12-19 NOTE — Progress Notes (Signed)
[facility]                                                                              Patient Demographics  [name], is a 71 y.o. female, DOB - 09-May-1946, RDE:[id number]  Admit date - 12/17/2016   Admitting Physician Janet Jaffe, MD  Outpatient Primary MD for the patient is [name]  Outpatient specialists:   LOS - 2  days    Chief Complaint  Patient presents with  . Failure To Thrive  . Wound Infection  . Weakness       Brief summary   Patient is a 71 year old female with dementia, chronic anemia, diabetes mellitus, hypertension, malnutrition, chronic right-sided knee pain from arthritis presented to ED with generalized weakness and failure to thrive. History was difficult to obtain from the patient. Per ED evaluation, patient was recently discharged on 3/31 with impetigo of the right hand/arm on oral antibiotics but she never got the prescription filled, now returned with worsening rash on the right upper extremity with foul-smelling odor. Has known benign R neck mass and venous insuff bilat LE's w chronic LE edema.    Assessment & Plan    Principal Problem:   Cellulitis of right arm: Scaly rash diffuse on the right arm appears to be a severe dermatology problem versus vasculitis  - MRSA PCR screen positive - Follow Doppler ultrasound of the right arm - ESR, CRP slightly elevated, vasculitis workup in process. RPR negative. Sjogren's antibodies pending, ANA, ANCA pending - Appreciate ID, [name] recommendations discussed in detail, recommended transfer to tertiary care for dermatology specialty coverage, biopsy and further management. Discussed with [name], accepted by [name], pending bed available - No need of antibiotics per ID  Active Problems:  History of hypertension - Not on any medications   Venous insufficiency of both lower extremities, chronic  - Patient was placed on Lasix 40 mg daily  Dementia  with Cognitive decline - Currently not on any medications  Diabetes mellitus type 2 - Diet controlled Patient has been off her medications for more than 2 years   Code Status: Full CODE STATUS  DVT Prophylaxis:  Lovenox Family Communication: Discussed in detail with the patient, all imaging results, lab results explained to the patient's daughter, agreeable with the plan for transfer   Disposition Plan: Transfer to [name] pending bed available  Time Spent in minutes  25 minutes  Procedures:    Consultants:   Infectious disease  Antimicrobials:   Vancomycin 4/10> 4/11   Medications  Scheduled Meds: . Chlorhexidine Gluconate Cloth  6 each Topical Q0600  . docusate sodium  100 mg Oral BID  . enoxaparin (LOVENOX) injection  40 mg Subcutaneous Q24H  . furosemide  40 mg Oral Daily  . mupirocin ointment  1 application Nasal BID  . sodium chloride flush  3 mL Intravenous Q12H   Continuous Infusions: PRN Meds:.sodium chloride, acetaminophen **OR** acetaminophen, HYDROcodone-acetaminophen, ondansetron **OR** ondansetron (ZOFRAN) IV, sodium chloride flush   Antibiotics   Anti-infectives    Start     Dose/Rate Route Frequency Ordered Stop   12/19/16 1800  vancomycin (VANCOCIN)  1,250 mg in sodium chloride 0.9 % 250 mL IVPB  Status:  Discontinued     1,250 mg 166.7 mL/hr over 90 Minutes Intravenous Every 24 hours 12/18/16 0007 12/18/16 1317   12/17/16 1715  vancomycin (VANCOCIN) 1,750 mg in sodium chloride 0.9 % 500 mL IVPB     20 mg/kg  87.5 kg 250 mL/hr over 120 Minutes Intravenous  Once 12/17/16 1711 12/17/16 2241   12/17/16 1715  ceFEPIme (MAXIPIME) 2 g in dextrose 5 % 50 mL IVPB     2 g 100 mL/hr over 30 Minutes Intravenous  Once 12/17/16 1711 12/17/16 1742        Subjective:   [name] was seen and examined today. States her right arm is feeling somewhat better today., Spiking low-grade fever 99.1F.  Patient denies dizziness, chest pain,  shortness of breath, abdominal pain, N/V/D/C, new weakness, numbess, tingling.   Objective:   Vitals:   12/18/16 0500 12/18/16 1304 12/18/16 2121 12/19/16 0500  BP:  138/77 139/81   Pulse:  (!) 106 (!) 111   Resp:  19 18   Temp:  99.1 F (37.3 C) 99.9 F (37.7 C)   TempSrc:  Oral Oral   SpO2:  99% 99%   Weight: 84.8 kg (187 lb)   87.1 kg (192 lb 0.3 oz)  Height:        Intake/Output Summary (Last 24 hours) at 12/19/16 1205 Last data filed at 12/19/16 0900  Gross per 24 hour  Intake              480 ml  Output                0 ml  Net              480 ml     Wt Readings from Last 3 Encounters:  12/19/16 87.1 kg (192 lb 0.3 oz)  12/06/16 87.8 kg (193 lb 9.6 oz)  11/13/16 94.3 kg (208 lb)     Exam  General: Alert and oriented , NAD  HEENT:    Neck: Supple, no JVD  Cardiovascular: S1 S2 Clear, RRR, tachycardia  Respiratory: Clear to auscultation bilaterally, no wheezing, rales or rhonchi  Gastrointestinal: Soft, nontender, nondistended, + bowel sounds  Ext: no cyanosis clubbing. Bilateral chronic lower extremity venous changes   Neuro: No new deficits  Skin: Right arm with marked thickening of the skin, scaly and swollen, foul-smelling odor,   Psych: Normal affect and demeanor, alert and oriented    Data Reviewed:  I have personally reviewed following labs and imaging studies  Micro Results Recent Results (from the past 240 hour(s))  Blood culture (routine x 2)     Status: None (Preliminary result)   Collection Time: 12/17/16  5:00 PM  Result Value Ref Range Status   Specimen Description BLOOD LEFT ANTECUBITAL  Final   Special Requests   Final    BOTTLES DRAWN AEROBIC AND ANAEROBIC Blood Culture adequate volume   Culture NO GROWTH 2 DAYS  Final   Report Status PENDING  Incomplete  Blood culture (routine x 2)     Status: None (Preliminary result)   Collection Time: 12/17/16  5:06 PM  Result Value Ref Range Status   Specimen Description BLOOD LEFT  HAND  Final   Special Requests IN PEDIATRIC BOTTLE Blood Culture adequate volume  Final   Culture NO GROWTH 2 DAYS  Final   Report Status PENDING  Incomplete  MRSA PCR Screening  Status: Abnormal   Collection Time: 12/18/16 12:26 AM  Result Value Ref Range Status   MRSA by PCR POSITIVE (A) NEGATIVE Final    Comment:        The GeneXpert MRSA Assay (FDA approved for NASAL specimens only), is one component of a comprehensive MRSA colonization surveillance program. It is not intended to diagnose MRSA infection nor to guide or monitor treatment for MRSA infections. RESULT CALLED TO, READ BACK BY AND VERIFIED WITH: NANCY CAGUIOA,RN _0  12/18/16 MKELLY,MLT     Radiology Reports Dg Chest 2 View  Result Date: 12/17/2016 CLINICAL DATA:  Failure to thrive, fatigue for 2 months, history hypertension, diabetes mellitus, polio, cognitive decline, former smoker EXAM: CHEST  2 VIEW COMPARISON:  12/06/2016 FINDINGS: Normal heart size and pulmonary vascularity. Calcified tortuous thoracic aorta. RIGHT lower lobe infiltrate with small RIGHT pleural effusion. Remaining lungs clear. No pneumothorax. Bones demineralized with LEFT glenohumeral degenerative changes noted. Deformity at the proximal LEFT humerus where a lytic lesion with surrounding well-defined sclerotic rim is identified, stable since 02/17/2009, likely benign. IMPRESSION: RIGHT lower lobe pneumonia with small RIGHT pleural effusion. Aortic atherosclerosis. Electronically Signed   By: Lavonia Dana M.D.   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 EXAM: PORTABLE CHEST 1 VIEW COMPARISON:  02/04/2016 chest radiograph FINDINGS: Stable normal cardiac silhouette. Aortic atherosclerosis with calcification. Clear lungs. No pleural effusion or pneumothorax. Multilevel degenerative changes of the spine. IMPRESSION: No active disease. Electronically Signed    By: Kristine Garbe M.D.   On: 12/06/2016 02:25    Lab Data:  CBC:  Recent Labs Lab 12/17/16 1704 12/17/16 1726 12/18/16 0701  WBC 11.2*  --  7.8  NEUTROABS 9.0*  --   --   HGB 11.0* 11.9* 10.9*  HCT 35.5* 35.0* 34.5*  MCV 73.0*  --  73.1*  PLT 237  --  662   Basic Metabolic Panel:  Recent Labs Lab 12/17/16 1704 12/17/16 1726 12/18/16 0701  NA 138 137 139  K 3.8 3.8 3.6  CL 102 103 105  CO2 24  --  23  GLUCOSE 66 65 78  BUN _1 CREATININE 0.75 0.70 0.71  CALCIUM 8.8*  --  8.3*  MG 1.9  --   --    GFR: Estimated Creatinine Clearance: 71 mL/min (by C-G formula based on SCr of 0.71 mg/dL). Liver Function Tests:  Recent Labs Lab 12/17/16 1704  AST 20  ALT 12*  ALKPHOS 81  BILITOT 1.0  PROT 6.8  ALBUMIN 2.9*   No results for input(s): LIPASE, AMYLASE in the last 168 hours. No results for input(s): AMMONIA in the last 168 hours. Coagulation Profile: No results for input(s): INR, PROTIME in the last 168 hours. Cardiac Enzymes: No results for input(s): CKTOTAL, CKMB, CKMBINDEX, TROPONINI in the last 168 hours. BNP (last 3 results) No results for input(s): PROBNP in the last 8760 hours. HbA1C: No results for input(s): HGBA1C in the last 72 hours. CBG: No results for input(s): GLUCAP in the last 168 hours. Lipid Profile: No results for input(s): CHOL, HDL, LDLCALC, TRIG, CHOLHDL, LDLDIRECT in the last 72 hours. Thyroid Function Tests: No results for input(s): TSH, T4TOTAL, FREET4, T3FREE, THYROIDAB in the last 72 hours. Anemia Panel: No results for input(s): VITAMINB12, FOLATE, FERRITIN, TIBC, IRON, RETICCTPCT in the last 72 hours. Urine analysis:    Component Value Date/Time  COLORURINE YELLOW 12/18/2016 0626   APPEARANCEUR HAZY (A) 12/18/2016 0626   LABSPEC 1.010 12/18/2016 0626   PHURINE 5.0 12/18/2016 0626   GLUCOSEU NEGATIVE 12/18/2016 0626   HGBUR SMALL (A) 12/18/2016 0626   BILIRUBINUR NEGATIVE 12/18/2016 0626   KETONESUR 20 (A)  12/18/2016 0626   PROTEINUR NEGATIVE 12/18/2016 0626   UROBILINOGEN 1.0 06/06/2014 1311   NITRITE NEGATIVE 12/18/2016 0626   LEUKOCYTESUR NEGATIVE 12/18/2016 0626     Ripudeep Rai M.D. [facility] 12/19/2016, 12:05 PM  Pager: [phone] Between 7am to 7pm - call Pager - [phone]  After 7pm go to [email or web address] - password [email or web address]  Call night coverage person covering after 7pm

## 2016-12-19 NOTE — Progress Notes (Addendum)
Patient for transfer to [facility] room 951, awaiting [facility] transporter, report was given to nurse Brayton Mars, daughter [name] was made aware.

## 2016-12-19 NOTE — Progress Notes (Signed)
Patient left [facility] to [facility] room 951 via [facility] with [place] as one of the [employer].  Dr Ronalee Red will be thwe receiving MD and called [facility] RN for awareness.

## 2016-12-19 NOTE — Discharge Summary (Addendum)
TRANSFER SUMMARY   [name] WGN:[id number],HQI:[id number]  is a 71 y.o. female  Outpatient Primary MD for the patient is Mauricio Po, [place] Admission date: 12/17/2016 Transfer Date 12/19/2016 Admitting Physician Roney Jaffe, MD   Place of Transfer; [facility] Accepting MD: Dr Maura Crandall Mode : [facility] Condition stable   Admission Diagnosis  Impetigo [L01.00] Dehydration [E86.0] Failure to thrive in adult [R62.7] Generalized weakness [R53.1] Cellulitis, unspecified cellulitis site [L03.90]   Discharge Diagnosis     Rash, right upper extremity, possible vasculitis  Diet-controlled diabetes mellitus Dementia   failure to thrive Chronic lower extremity edema/venous insufficiency   Past Medical History:  Diagnosis Date  . Acute pericarditis, unspecified   . Arthritis   . Arthritis   . Bilateral swelling of feet   . Cellulitis   . Chronic anemia   . Chronic knee pain    right  . Closed fracture of styloid process of ulna   . Cognitive decline   . Decreased appetite   . Diabetes mellitus    A1C has been normal for past 2 years and takes no diabetic meds at present  . Distal radius fracture, left   . Distal radius fracture, left   . ECG abnormal   . Fall   . Fever   . Hypertension   . Hypertension   . Hypoglycemia   . Knee pain, chronic   . Left scapholunate ligament tear   . Lower extremity edema   . Malnutrition of moderate degree ([place])   . Mass of right side of neck   . Microcytic anemia   . Polio   . Polio   . Radius fracture   . Severe protein-calorie malnutrition ([place])   . Swelling of joint, knee, right   . Type 2 diabetes mellitus, controlled ([place])   . Venous insufficiency     Past Surgical History:  Procedure Laterality Date  . ABDOMINAL HYSTERECTOMY    . OPEN REDUCTION INTERNAL FIXATION (ORIF) DISTAL RADIAL FRACTURE Left 02/29/2016   Procedure: LEFT DISTAL RADIUS REPAIR OF MALUNION WITH OPEN REDUCTION INTERNAL FIXATION  (ORIF);  Surgeon: Iran Planas, MD;  Location: [place];  Service: Orthopedics;  Laterality: Left;    Consults infectious disease, Dr. Drucilla Schmidt   Brief narrative See H&P for all details in brief, Patient is a 71 year old female with dementia, chronic anemia, diabetes mellitus, hypertension, malnutrition, chronic right-sided knee pain from arthritis presented to ED with generalized weakness and failure to thrive. History was difficult to obtain from the patient. Per ED evaluation, patient was recently discharged on 3/31 with impetigo of the right hand/arm on oral antibiotics but she never got the prescription filled, now returned with worsening rash on the right upper extremity with foul-smelling odor. Has known benign R neck mass and venous insuff bilat LE's w chronic LE edema.   Hospital course Cellulitis of right arm: Scaly rash diffuse on the right arm appears to be a severe dermatology problem versus vasculitis  - MRSA PCR screen positive - Doppler ultrasound of the right arm negative for DVT or SVT - ESR, CRP slightly elevated, vasculitis workup in process. RPR negative. Sjogren's antibodies pending, ANA, ANCA pending -Infectious disease was consulted. Appreciate ID, Dr. Arlyss Queen recommendations discussed in detail, recommended transfer to tertiary care for dermatology specialty coverage, biopsy and further management. Discussed with [facility], accepted by Dr Maura Crandall - No need of antibiotics per ID   History of hypertension -BP stable, Not on any medications   Venous insufficiency of both  lower extremities, chronic  - Patient was placed on Lasix 40 mg daily  Dementia with Cognitive decline - Currently not on any medications  Diabetes mellitus type 2 - Diet controlled Patient has been off her medications for more than 2 years  Today   Subjective:   [name] today has no headache,no chest abdominal pain,no new weakness tingling or numbness. Patient reports that her  right arm feels slightly better, still spiking low-grade fevers 99.18F.foul-smelling odor from her right arm.   Objective:   Blood pressure 139/81, pulse (!) 111, temperature 99.9 F (37.7 C), temperature source Oral, resp. rate 18, height 5' 7" (1.702 m), weight 87.1 kg (192 lb 0.3 oz), SpO2 99 %.  Intake/Output Summary (Last 24 hours) at 12/19/16 1214 Last data filed at 12/19/16 0900  Gross per 24 hour  Intake              480 ml  Output                0 ml  Net              480 ml    Exam Awake Alert, Oriented , No new F.N deficits, Normal affect HEENT:EOMI, PERLA Neck: Supple,no JVD, No cervical lymphadenopathy appreciated.  Chest: clear to ausculatation, no wheezing, rales and rhonchi CVS: RRR,No murmurs, rubs or gallops Abdomen: Normal bowel sounds, Soft, Non tender, No organomegaly, No rebound or guarding Extremeties: No Cyanosis, Clubbing Right arm with marked thickening of the skin, scaly and swollen, foul-smelling odor  Data Review  CBC w Diff: Lab Results  Component Value Date   WBC 7.8 12/18/2016   HGB 10.9 (L) 12/18/2016   HCT 34.5 (L) 12/18/2016   PLT 198 12/18/2016   LYMPHOPCT 14 12/17/2016   MONOPCT 5 12/17/2016   EOSPCT 1 12/17/2016   BASOPCT 0 12/17/2016   CMP: Lab Results  Component Value Date   NA 139 12/18/2016   K 3.6 12/18/2016   CL 105 12/18/2016   CO2 23 12/18/2016   BUN 6 12/18/2016   CREATININE 0.71 12/18/2016   PROT 6.8 12/17/2016   ALBUMIN 2.9 (L) 12/17/2016   BILITOT 1.0 12/17/2016   ALKPHOS 81 12/17/2016   AST 20 12/17/2016   ALT 12 (L) 12/17/2016  .  Significant Tests   Significant Diagnostic Studies:  Dg Chest 2 View  Result Date: 12/17/2016 CLINICAL DATA:  Failure to thrive, fatigue for 2 months, history hypertension, diabetes mellitus, polio, cognitive decline, former smoker EXAM: CHEST  2 VIEW COMPARISON:  12/06/2016 FINDINGS: Normal heart size and pulmonary vascularity. Calcified tortuous thoracic aorta. RIGHT lower lobe  infiltrate with small RIGHT pleural effusion. Remaining lungs clear. No pneumothorax. Bones demineralized with LEFT glenohumeral degenerative changes noted. Deformity at the proximal LEFT humerus where a lytic lesion with surrounding well-defined sclerotic rim is identified, stable since 02/17/2009, likely benign. IMPRESSION: RIGHT lower lobe pneumonia with small RIGHT pleural effusion. Aortic atherosclerosis. Electronically Signed   By: Lavonia Dana M.D.   On: 12/17/2016 16:32      Scheduled Meds: . Chlorhexidine Gluconate Cloth  6 each Topical Q0600  . docusate sodium  100 mg Oral BID  . enoxaparin (LOVENOX) injection  40 mg Subcutaneous Q24H  . furosemide  40 mg Oral Daily  . mupirocin ointment  1 application Nasal BID  . sodium chloride flush  3 mL Intravenous Q12H   Continuous Infusions:     The risks and  Benefits of transporting including disability, death and Discomfort  were discussed with the patient or health power of attorney and were agreeable to the plan and further management.  Total Time in preparing paper work, todays exam and data evaluation: 25 minutes  Signed: Estill Cotta M.D. [facility] 12/19/2016, 12:14 PM

## 2016-12-22 LAB — CULTURE, BLOOD (ROUTINE X 2)
Culture: NO GROWTH
Culture: NO GROWTH
Special Requests: ADEQUATE
Special Requests: ADEQUATE

## 2016-12-22 LAB — MPO/PR-3 (ANCA) ANTIBODIES
ANCA Proteinase 3: <3.5 U/mL (ref 0.0–3.5)
Myeloperoxidase Abs: <9 U/mL (ref 0.0–9.0)

## 2017-01-24 ENCOUNTER — Inpatient Hospital Stay: Payer: Medicare Other | Admitting: Family

## 2017-01-27 ENCOUNTER — Inpatient Hospital Stay: Payer: Medicare Other | Admitting: Family

## 2017-01-30 ENCOUNTER — Inpatient Hospital Stay: Payer: Medicare Other | Admitting: Family

## 2017-02-07 ENCOUNTER — Encounter: Payer: Self-pay | Admitting: Family

## 2017-02-07 ENCOUNTER — Other Ambulatory Visit (INDEPENDENT_AMBULATORY_CARE_PROVIDER_SITE_OTHER): Payer: Medicare Other

## 2017-02-07 ENCOUNTER — Other Ambulatory Visit: Payer: Self-pay | Admitting: Family

## 2017-02-07 ENCOUNTER — Ambulatory Visit: Payer: Medicare Other | Admitting: Podiatry

## 2017-02-07 ENCOUNTER — Ambulatory Visit (INDEPENDENT_AMBULATORY_CARE_PROVIDER_SITE_OTHER): Payer: Medicare Other | Admitting: Family

## 2017-02-07 VITALS — BP 142/86 | HR 94 | Temp 99.5°F | Resp 16 | Ht 67.0 in | Wt 192.8 lb

## 2017-02-07 DIAGNOSIS — R531 Weakness: Secondary | ICD-10-CM

## 2017-02-07 DIAGNOSIS — Z7409 Other reduced mobility: Secondary | ICD-10-CM | POA: Diagnosis not present

## 2017-02-07 DIAGNOSIS — M7989 Other specified soft tissue disorders: Secondary | ICD-10-CM

## 2017-02-07 DIAGNOSIS — I872 Venous insufficiency (chronic) (peripheral): Secondary | ICD-10-CM | POA: Diagnosis not present

## 2017-02-07 DIAGNOSIS — J181 Lobar pneumonia, unspecified organism: Secondary | ICD-10-CM | POA: Diagnosis not present

## 2017-02-07 DIAGNOSIS — L03119 Cellulitis of unspecified part of limb: Secondary | ICD-10-CM

## 2017-02-07 DIAGNOSIS — J189 Pneumonia, unspecified organism: Secondary | ICD-10-CM

## 2017-02-07 DIAGNOSIS — R600 Localized edema: Secondary | ICD-10-CM

## 2017-02-07 LAB — CBC
HEMATOCRIT: 33.2 % — AB (ref 36.0–46.0)
HEMOGLOBIN: 10.4 g/dL — AB (ref 12.0–15.0)
MCHC: 31.4 g/dL (ref 30.0–36.0)
MCV: 72.5 fl — AB (ref 78.0–100.0)
PLATELETS: 258 10*3/uL (ref 150.0–400.0)
RBC: 4.57 Mil/uL (ref 3.87–5.11)
RDW: 15.9 % — ABNORMAL HIGH (ref 11.5–15.5)
WBC: 5.1 10*3/uL (ref 4.0–10.5)

## 2017-02-07 LAB — BASIC METABOLIC PANEL
BUN: 12 mg/dL (ref 6–23)
CHLORIDE: 105 meq/L (ref 96–112)
CO2: 29 meq/L (ref 19–32)
Calcium: 9.3 mg/dL (ref 8.4–10.5)
Creatinine, Ser: 0.77 mg/dL (ref 0.40–1.20)
GFR: 94.45 mL/min (ref >60.00)
GLUCOSE: 78 mg/dL (ref 70–99)
Potassium: 3.5 mEq/L (ref 3.5–5.1)
SODIUM: 140 meq/L (ref 135–145)

## 2017-02-07 MED ORDER — FUROSEMIDE 40 MG PO TABS: 40.0000 mg | ORAL_TABLET | Freq: Every day | ORAL | 0 refills | Status: DC

## 2017-02-07 NOTE — Assessment & Plan Note (Signed)
Patient has decreased mobility and endurance most likely related to deconditioning as well as bilateral venous insufficiency and pain. Unable to walk long distances without stopping. Believe she will benefit from a wheelchair to help her for longer distances and does have the capacity to move around the house with a wheelchair and assistance to operate it. [facility] placed to [facility] for wheelchair.

## 2017-02-07 NOTE — Patient Instructions (Addendum)
Thank you for choosing [facility].  SUMMARY AND INSTRUCTIONS:  Complete the clindamycin.  They will call with your referral to get an Rolena Infante for your leg.   In the meantime cover wounds with nonstick gauze and wrap lightly with roller gauze.  Restart taking furosemide.   They will call with information for the wheel chair.   Follow up pending referrals.   Medication:  Your prescription(s) have been submitted to your pharmacy or been printed and provided for you. Please take as directed and contact our office if you believe you are having problem(s) with the medication(s) or have any questions.  Labs:  Please stop by the lab on the lower level of the building for your blood work. Your results will be released to [place] (or called to you) after review, usually within 72 hours after test completion. If any changes need to be made, you will be notified at that same time.  1.) The lab is open from 7:30am to 5:30 pm Monday-Friday 2.) No appointment is necessary 3.) Fasting (if needed) is 6-8 hours after food and drink; black coffee and water are okay   Follow up:  If your symptoms worsen or fail to improve, please contact our office for further instruction, or in case of emergency go directly to the emergency room at the closest medical facility.

## 2017-02-07 NOTE — Assessment & Plan Note (Signed)
Pneumonia appears resolved with current medication regimen and no adverse side effects with no symptoms noted upon exam. No further treatment necessary at this time. Follow-up if symptoms return.

## 2017-02-07 NOTE — Progress Notes (Signed)
Subjective:    Patient ID: [name], female    DOB: June 22, 1946, 71 y.o.   MRN: [id number]  Chief Complaint  Patient presents with  . Hospitalization Follow-up    leg swelling and draining    HPI:  [name] is a 71 y.o. female who  has a past medical history of Acute pericarditis, unspecified; Arthritis; Arthritis; Bilateral swelling of feet; Cellulitis; Chronic anemia; Chronic knee pain; Closed fracture of styloid process of ulna; Cognitive decline; Decreased appetite; Diabetes mellitus; Distal radius fracture, left; Distal radius fracture, left; ECG abnormal; Fall; Fever; Hypertension; Hypertension; Hypoglycemia; Knee pain, chronic; Left scapholunate ligament tear; Lower extremity edema; Malnutrition of moderate degree ([place]); Mass of right side of neck; Microcytic anemia; Polio; Polio; Radius fracture; Severe protein-calorie malnutrition ([place]); Swelling of joint, knee, right; Type 2 diabetes mellitus, controlled ([place]); and Venous insufficiency. and presents today for a hospitalization follow up.   Recently evaluated in the emergency department and admitted to the hospital with the associated symptoms of shortness of breath and potential wound infection. Physical exam she was noted to have a sickly appearance with dry mucous membranes and tachycardia present on exam. She was noted to have a wound on the left hand covered in gauze adhered to the skin and right arm with weeping honey crusted area concerning for impetigo with no significant improvement from previous photos. She was noted to have intermittent wound care and had not been taking antibiotics secondary to cost. Chest x-ray consistent with pneumonia and patient given vancomycin and cefepime. She required admission for further management of her skin with even, pneumonia, and failure to thrive. Concern for potential long-term care facility as she is unable to care for herself at this time. Her discharge diagnosis was a rash to  the right upper extremity and possible vasculitis she was transferred to [facility] for additional treatments and possible cellulitis. She was discharged with a course of doxycycline with recommended follow-up with dermatology. All hospital records, labs, and imaging reviewed in detail.  Since leaving the hospital she reports the rash on her hands has improved significantly, however continues to experience lower extremity edema and drainage. She was prescribed doxycycline upon discharge from the hospital and presents with doxycycline still remaining incomplete with medication remaining in the bottle. She also has a prescription for clindamycin which has yet to be completed. Describes drainage is moderate and changing colors worsening with movement. Not currently wrapped with any bandage or treatment. She continues to use the SSD and clobetasol cream. She does have some relief when seated and does attempt to elevate her legs when possible. She continues to have decreased mobility with walking less than 200 feet or standing for longer than 10-15 minutes at a time and required wheelchair to enter facility today.   Allergies  Allergen Reactions  . Orange Concentrate [Flavoring Agent] Shortness Of Breath, Itching and Swelling  . Peach [Prunus Persica] Shortness Of Breath, Itching and Swelling  . Penicillins Anaphylaxis and Hives    All 'cillins' Has patient had a PCN reaction causing immediate rash, facial/tongue/throat swelling, SOB or lightheadedness with hypotension: Yes Has patient had a PCN reaction causing severe rash involving mucus membranes or skin necrosis: No Has patient had a PCN reaction that required hospitalization No Has patient had a PCN reaction occurring within the last 10 years: No If all of the above answers are "NO", then may proceed with Cephalosporin use.   . Strawberry Extract Shortness Of Breath, Itching  and Swelling  . Adhesive [Tape] Itching and Rash     Please use "paper" tape  . Aspirin Itching  . Latex Itching and Rash  . Other Hives, Itching and Rash    NO "-CILLINS"  . Pineapple Itching and Rash  . Strawberry Flavor Itching and Rash      Outpatient Medications Prior to Visit  Medication Sig Dispense Refill  . Acetaminophen (TYLENOL) 325 MG CAPS Take 325-650 mg by mouth every 6 (six) hours as needed (for pain).    . furosemide (LASIX) 40 MG tablet Take 1 tablet (40 mg total) by mouth daily. 5 tablet 0  . mupirocin ointment (BACTROBAN) 2 % Place 1 application into the nose 2 (two) times daily. 22 g 0  . docusate sodium (COLACE) 100 MG capsule Take 1 capsule (100 mg total) by mouth 2 (two) times daily. (Patient not taking: Reported on 12/17/2016) 30 capsule 0  . Ferrous Sulfate (IRON) 325 (65 Fe) MG TABS Take 325 mg by mouth daily at 12 noon. (Patient not taking: Reported on 12/17/2016) 30 each 0  . HYDROcodone-acetaminophen (NORCO/VICODIN) 5-325 MG tablet Take 1 tablet by mouth every 8 (eight) hours as needed for moderate pain. (Patient not taking: Reported on 12/17/2016) 20 tablet 0  . vitamin C (ASCORBIC ACID) 500 MG tablet Take 1 tablet (500 mg total) by mouth daily. (Patient not taking: Reported on 12/17/2016) 50 tablet 0   No facility-administered medications prior to visit.       Past Surgical History:  Procedure Laterality Date  . ABDOMINAL HYSTERECTOMY    . OPEN REDUCTION INTERNAL FIXATION (ORIF) DISTAL RADIAL FRACTURE Left 02/29/2016   Procedure: LEFT DISTAL RADIUS REPAIR OF MALUNION WITH OPEN REDUCTION INTERNAL FIXATION (ORIF);  Surgeon: Iran Planas, MD;  Location: [facility];  Service: Orthopedics;  Laterality: Left;      Past Medical History:  Diagnosis Date  . Acute pericarditis, unspecified   . Arthritis   . Arthritis   . Bilateral swelling of feet   . Cellulitis   . Chronic anemia   . Chronic knee pain    right  . Closed fracture of styloid process of ulna   . Cognitive decline   . Decreased appetite   .  Diabetes mellitus    A1C has been normal for past 2 years and takes no diabetic meds at present  . Distal radius fracture, left   . Distal radius fracture, left   . ECG abnormal   . Fall   . Fever   . Hypertension   . Hypertension   . Hypoglycemia   . Knee pain, chronic   . Left scapholunate ligament tear   . Lower extremity edema   . Malnutrition of moderate degree ([place])   . Mass of right side of neck   . Microcytic anemia   . Polio   . Polio   . Radius fracture   . Severe protein-calorie malnutrition ([place])   . Swelling of joint, knee, right   . Type 2 diabetes mellitus, controlled ([place])   . Venous insufficiency       Review of Systems  Constitutional: Negative for chills and fever.  Respiratory: Negative for chest tightness and shortness of breath.   Cardiovascular: Positive for leg swelling. Negative for chest pain and palpitations.  Skin: Positive for color change and rash.      Objective:    BP (!) 142/86 (BP Location: Left Arm, Patient Position: Sitting, Cuff Size: Large)   Pulse 94  Temp 99.5 F (37.5 C) (Oral)   Resp 16   Ht 5\' 7"  (1.702 m)   Wt 192 lb 12.8 oz (87.5 kg)   SpO2 97%   BMI 30.20 kg/m  Nursing note and vital signs reviewed.  Physical Exam  Constitutional: She is oriented to person, place, and time. She appears well-developed and well-nourished. No distress.  Seated in the wheelchair.   Cardiovascular: Normal rate, regular rhythm, normal heart sounds and intact distal pulses.   Moderate to severe lower extremity edema with 1-2+ pitting. There is mild serous discharge from 2 venous ulcers.   Pulmonary/Chest: Effort normal and breath sounds normal.  Neurological: She is alert and oriented to person, place, and time.  Skin: Skin is warm and dry.  Psychiatric: She has a normal mood and affect. Her behavior is normal. Judgment and thought content normal.       Assessment & Plan:   Problem List Items Addressed This Visit       Cardiovascular and Mediastinum   Venous insufficiency of both lower extremities, chronic    Bilateral chronic venous insufficiency with concern for cellulitis and incomplete antibiotics. Advised to complete antibiotics as prescribed. Restart furosemide to help with lower extremity edema. May benefit from an [place] boot with referral to vascular surgery placed.      Relevant Orders   Ambulatory referral to Vascular Surgery     Respiratory   Community acquired pneumonia of right lower lobe of lung ([place])    Pneumonia appears resolved with current medication regimen and no adverse side effects with no symptoms noted upon exam. No further treatment necessary at this time. Follow-up if symptoms return.      Relevant Medications   clindamycin (CLEOCIN) 150 MG capsule     Other   Cellulitis - Primary    Symptoms and exam remain consistent with cellulitis with venous insufficiency and ulcers present. She has yet to take medications prescribed from her hospitalization approximately 2 weeks ago. Encouraged to complete course of doxycycline and keep legs elevated and wrapped for protection. She may benefit from an Haematologist. Referral to vascular surgery placed. Obtain basic metabolic profile and CBC. Concern for ability for patient to care for herself. Consider skilled nursing facility placement or long-term care facility. Follow-up if symptoms worsen or do not improve.      Relevant Orders   Basic Metabolic Panel (BMET) (Completed)   CBC (Completed)   Decreased mobility and endurance    Patient has decreased mobility and endurance most likely related to deconditioning as well as bilateral venous insufficiency and pain. Unable to walk long distances without stopping. Believe she will benefit from a wheelchair to help her for longer distances and does have the capacity to move around the house with a wheelchair and assistance to operate it. Order placed to advanced home care for wheelchair.           I have discontinued Ms. Ferrall's Iron, HYDROcodone-acetaminophen, docusate sodium, and vitamin C. I am also having her maintain her Acetaminophen, furosemide, mupirocin ointment, silver sulfADIAZINE, clobetasol cream, and clindamycin.   Meds ordered this encounter  Medications  . silver sulfADIAZINE (SILVADENE) 1 % cream    Sig: Apply 1 application topically daily.  . clobetasol cream (TEMOVATE) 0.05 %    Sig: Apply 1 application topically 2 (two) times daily.  . clindamycin (CLEOCIN) 150 MG capsule    Sig: Take 150 mg by mouth 3 (three) times daily.     Follow-up: Return in about  1 month (around 03/09/2017), or if symptoms worsen or fail to improve.  Mauricio Po, FNP

## 2017-02-07 NOTE — Assessment & Plan Note (Signed)
Bilateral chronic venous insufficiency with concern for cellulitis and incomplete antibiotics. Advised to complete antibiotics as prescribed. Restart furosemide to help with lower extremity edema. May benefit from an [place] boot with referral to vascular surgery placed.

## 2017-02-07 NOTE — Assessment & Plan Note (Addendum)
Symptoms and exam remain consistent with cellulitis with venous insufficiency and ulcers present. She has yet to take medications prescribed from her hospitalization approximately 2 weeks ago. Encouraged to complete course of doxycycline and keep legs elevated and wrapped for protection. She may benefit from an Haematologist. Referral to vascular surgery placed. Obtain basic metabolic profile and CBC. Concern for ability for patient to care for herself. Consider skilled nursing facility placement or long-term care facility. Follow-up if symptoms worsen or do not improve.

## 2017-02-13 ENCOUNTER — Other Ambulatory Visit: Payer: Self-pay | Admitting: *Deleted

## 2017-02-13 DIAGNOSIS — I872 Venous insufficiency (chronic) (peripheral): Secondary | ICD-10-CM

## 2017-02-21 ENCOUNTER — Ambulatory Visit: Payer: Medicare Other | Admitting: Podiatry

## 2017-03-01 ENCOUNTER — Emergency Department (HOSPITAL_COMMUNITY)
Admission: EM | Admit: 2017-03-01 | Discharge: 2017-03-02 | Disposition: A | Discharge status: Home / Self Care | Payer: Medicare Other | Attending: Emergency Medicine | Admitting: Emergency Medicine

## 2017-03-01 ENCOUNTER — Encounter (HOSPITAL_COMMUNITY): Payer: Self-pay

## 2017-03-01 ENCOUNTER — Other Ambulatory Visit: Payer: Self-pay | Admitting: Family

## 2017-03-01 DIAGNOSIS — F09 Unspecified mental disorder due to known physiological condition: Secondary | ICD-10-CM | POA: Diagnosis not present

## 2017-03-01 DIAGNOSIS — R531 Weakness: Secondary | ICD-10-CM | POA: Insufficient documentation

## 2017-03-01 DIAGNOSIS — F29 Unspecified psychosis not due to a substance or known physiological condition: Secondary | ICD-10-CM

## 2017-03-01 DIAGNOSIS — R5383 Other fatigue: Secondary | ICD-10-CM | POA: Diagnosis present

## 2017-03-01 LAB — COMPREHENSIVE METABOLIC PANEL
ALT: 9 U/L — ABNORMAL LOW (ref 14–54)
ANION GAP: 9 (ref 5–15)
AST: 16 U/L (ref 15–41)
Albumin: 3.4 g/dL — ABNORMAL LOW (ref 3.5–5.0)
Alkaline Phosphatase: 71 U/L (ref 38–126)
BILIRUBIN TOTAL: 0.6 mg/dL (ref 0.3–1.2)
BUN: 13 mg/dL (ref 6–20)
CO2: 25 mmol/L (ref 22–32)
Calcium: 9.3 mg/dL (ref 8.9–10.3)
Chloride: 103 mmol/L (ref 101–111)
Creatinine, Ser: 0.69 mg/dL (ref 0.44–1.00)
Glucose, Bld: 93 mg/dL (ref 65–99)
POTASSIUM: 3.4 mmol/L — AB (ref 3.5–5.1)
Sodium: 137 mmol/L (ref 135–145)
TOTAL PROTEIN: 7.5 g/dL (ref 6.5–8.1)

## 2017-03-01 LAB — CBC WITH DIFFERENTIAL/PLATELET
Basophils Absolute: 0 10*3/uL (ref 0.0–0.1)
Basophils Relative: 0 %
Eosinophils Absolute: 0.2 10*3/uL (ref 0.0–0.7)
Eosinophils Relative: 4 %
HCT: 34.8 % — ABNORMAL LOW (ref 36.0–46.0)
Hemoglobin: 10.5 g/dL — ABNORMAL LOW (ref 12.0–15.0)
LYMPHS ABS: 2 10*3/uL (ref 0.7–4.0)
Lymphocytes Relative: 37 %
MCH: 22.2 pg — ABNORMAL LOW (ref 26.0–34.0)
MCHC: 30.2 g/dL (ref 30.0–36.0)
MCV: 73.4 fL — AB (ref 78.0–100.0)
MONO ABS: 0.3 10*3/uL (ref 0.1–1.0)
MONOS PCT: 5 %
NEUTROS ABS: 2.9 10*3/uL (ref 1.7–7.7)
Neutrophils Relative %: 54 %
PLATELETS: 189 10*3/uL (ref 150–400)
RBC: 4.74 MIL/uL (ref 3.87–5.11)
RDW: 14.7 % (ref 11.5–15.5)
WBC: 5.4 10*3/uL (ref 4.0–10.5)

## 2017-03-01 MED ORDER — SODIUM CHLORIDE 0.9 % IV SOLN
Freq: Once | INTRAVENOUS | Status: AC
  Administered 2017-03-01: via INTRAVENOUS

## 2017-03-01 NOTE — ED Triage Notes (Signed)
Pt complaining of generalized malaise. Pt denies any N/V/D. Pt denies any pain or SOB. Pt a/o x 4 at triage. Pt complaining of loss of appetite and fatigue. Pt afebrile at triage.

## 2017-03-01 NOTE — ED Notes (Signed)
ED Provider at bedside. 

## 2017-03-01 NOTE — ED Provider Notes (Signed)
[facility] DEPT Provider Note   CSN: [id number] Arrival date & time: 03/01/17  2008     History   Chief Complaint Chief Complaint  Patient presents with  . Fatigue    HPI [name] is a 71 y.o. female.  Patient with a history of HTN, T2DM (diet controlled) presents with family. The patient provides a vague history with complaints of bilateral leg pain for months and concern for a rash to hands and right thigh that is also chronic and recently diagnosed as eczema. No fever. Per the family, she has been forgetful, confused. This has also been worsening over time.    The history is provided by the patient. No language interpreter was used.    Past Medical History:  Diagnosis Date  . Acute pericarditis, unspecified   . Arthritis   . Arthritis   . Bilateral swelling of feet   . Cellulitis   . Chronic anemia   . Chronic knee pain    right  . Closed fracture of styloid process of ulna   . Cognitive decline   . Decreased appetite   . Diabetes mellitus    A1C has been normal for past 2 years and takes no diabetic meds at present  . Distal radius fracture, left   . Distal radius fracture, left   . ECG abnormal   . Fall   . Fever   . Hypertension   . Hypertension   . Hypoglycemia   . Knee pain, chronic   . Left scapholunate ligament tear   . Lower extremity edema   . Malnutrition of moderate degree ([place])   . Mass of right side of neck   . Microcytic anemia   . Polio   . Polio   . Radius fracture   . Severe protein-calorie malnutrition ([place])   . Swelling of joint, knee, right   . Type 2 diabetes mellitus, controlled ([place])   . Venous insufficiency     Patient Active Problem List   Diagnosis Date Noted  . Decreased mobility and endurance 02/07/2017  . Cellulitis   . [place] scabies 12/18/2016  . Community acquired pneumonia of right lower lobe of lung ([place])   . Dehydration   . Failure to thrive in adult   . Generalized weakness   . Impetigo   .  Rash of entire body 12/06/2016  . Localized swelling, mass, or lump of lower extremity, bilateral 12/06/2016  . Constipation 03/24/2016  . Distal radius fracture, left 02/29/2016  . Bilateral swelling of feet   . Radius fracture 02/04/2016  . Fall 02/04/2016  . Closed fracture of styloid process of ulna 02/04/2016  . Left scapholunate ligament tear 02/04/2016  . Distal radius fracture 02/04/2016  . Chronic anemia 02/04/2016  . Cognitive decline 02/04/2016  . Cellulitis of right arm 01/19/2016  . Swelling of joint, knee, right 10/19/2015  . Venous insufficiency of both lower extremities, chronic 10/19/2015  . Lower extremity edema 05/24/2015  . Type 2 diabetes mellitus, controlled ([place]) 05/24/2015  . Decreased appetite 05/24/2015  . Malnutrition of moderate degree ([place]) 06/07/2014  . Acute pericarditis, unspecified 06/07/2014  . Hypoglycemia 06/06/2014  . Fever 06/06/2014  . ECG abnormal 06/06/2014  . Knee pain, chronic 06/06/2014  . Mass of right side of neck 06/06/2014  . Severe protein-calorie malnutrition ([place]) 06/06/2014  . Microcytic anemia 06/06/2014    Past Surgical History:  Procedure Laterality Date  . ABDOMINAL HYSTERECTOMY    . OPEN REDUCTION INTERNAL FIXATION (ORIF) DISTAL  RADIAL FRACTURE Left 02/29/2016   Procedure: LEFT DISTAL RADIUS REPAIR OF MALUNION WITH OPEN REDUCTION INTERNAL FIXATION (ORIF);  Surgeon: Iran Planas, MD;  Location: [facility];  Service: Orthopedics;  Laterality: Left;    OB History    No data available       Home Medications    Prior to Admission medications   Medication Sig Start Date End Date Taking? Authorizing Provider  Acetaminophen (TYLENOL) 325 MG CAPS Take 325-650 mg by mouth every 6 (six) hours as needed (for pain).    [provider]  clindamycin (CLEOCIN) 150 MG capsule Take 150 mg by mouth 3 (three) times daily.    [provider]  clobetasol cream (TEMOVATE) 6.94 % Apply 1 application topically 2 (two) times  daily.    [provider]  furosemide (LASIX) 40 MG tablet Take 1 tablet (40 mg total) by mouth daily. 02/07/17 03/09/17  Golden Circle, FNP  mupirocin ointment (BACTROBAN) 2 % Place 1 application into the nose 2 (two) times daily. 12/19/16   Rai, Vernelle Emerald, MD  silver sulfADIAZINE (SILVADENE) 1 % cream Apply 1 application topically daily.    [provider]    Family History Family History  Problem Relation Age of Onset  . Ovarian cancer Mother   . COPD Father   . Hypertension Unknown   . High Cholesterol Unknown   . Diabetes Mellitus II Neg Hx   . Lupus Neg Hx   . Sarcoidosis Neg Hx     Social History Social History  Substance Use Topics  . Smoking status: Former Smoker    Packs/day: 0.50    Types: Cigarettes    Quit date: 09/09/1968  . Smokeless tobacco: Never Used  . Alcohol use No     Allergies   Orange concentrate [flavoring agent]; Peach [prunus persica]; Penicillins; Strawberry extract; Adhesive [tape]; Aspirin; Latex; Other; Pineapple; and Strawberry flavor   Review of Systems Review of Systems  Constitutional: Positive for fatigue. Negative for appetite change, chills and fever.  HENT: Negative.   Respiratory: Negative.  Negative for cough and shortness of breath.   Cardiovascular: Positive for leg swelling. Negative for chest pain.  Gastrointestinal: Negative.  Negative for abdominal pain, nausea and vomiting.  Genitourinary: Negative.   Musculoskeletal: Negative.   Skin: Positive for rash.  Neurological: Negative.      Physical Exam Updated Vital Signs BP 135/67   Pulse 86   Temp 97.9 F (36.6 C) (Oral)   Resp 15   Ht 5\' 7"  (1.702 m)   Wt 89.4 kg (197 lb)   SpO2 100%   BMI 30.85 kg/m   Physical Exam  Constitutional: She appears well-developed and well-nourished. No distress.  Patient has a strong smell of urine.  HENT:  Head: Normocephalic.  Mouth/Throat: Oropharynx is clear and moist.  Neck: Normal range of motion. Neck  supple.  Cardiovascular: Normal rate and intact distal pulses.   No murmur heard. Pulmonary/Chest: Effort normal. She has no wheezes. She has no rales.  Abdominal: Soft. There is no tenderness. There is no guarding.  Musculoskeletal: Normal range of motion. She exhibits edema.  Bilateral LE chronic venous stasis. No purulent drainage, erythema, warmth to touch. Superficial abrasion to lateral and proximal right LE.   Neurological: She is alert.  Patient is oriented to year, but not month. She is oriented to person and place. CN's 3-12 grossly intact. Speech is clear, no dysarthria. No facial asymmetry. No lateralizing weakness.  No deficits of coordination.  Skin: Skin is warm and dry.  Circular, scaling, hyperpigmented rash to lateral right thigh. No redness, swelling, drainage. Hands bilaterally have diffuse raised scaling rash. No redness or drainage.      ED Treatments / Results  Labs (all labs ordered are listed, but only abnormal results are displayed) Labs Reviewed  URINE CULTURE  CBC WITH DIFFERENTIAL/PLATELET  URINALYSIS, ROUTINE W REFLEX MICROSCOPIC  COMPREHENSIVE METABOLIC PANEL   Results for orders placed or performed during the hospital encounter of 03/01/17  CBC with Differential  Result Value Ref Range   WBC 5.4 4.0 - 10.5 K/uL   RBC 4.74 3.87 - 5.11 MIL/uL   Hemoglobin 10.5 (L) 12.0 - 15.0 g/dL   HCT 34.8 (L) 36.0 - 46.0 %   MCV 73.4 (L) 78.0 - 100.0 fL   MCH 22.2 (L) 26.0 - 34.0 pg   MCHC 30.2 30.0 - 36.0 g/dL   RDW 14.7 11.5 - 15.5 %   Platelets 189 150 - 400 K/uL   Neutrophils Relative % 54 %   Lymphocytes Relative 37 %   Monocytes Relative 5 %   Eosinophils Relative 4 %   Basophils Relative 0 %   Neutro Abs 2.9 1.7 - 7.7 K/uL   Lymphs Abs 2.0 0.7 - 4.0 K/uL   Monocytes Absolute 0.3 0.1 - 1.0 K/uL   Eosinophils Absolute 0.2 0.0 - 0.7 K/uL   Basophils Absolute 0.0 0.0 - 0.1 K/uL   RBC Morphology ELLIPTOCYTES   Urinalysis, Routine w reflex microscopic    Result Value Ref Range   Color, Urine YELLOW YELLOW   APPearance CLEAR CLEAR   Specific Gravity, Urine 1.023 1.005 - 1.030   pH 6.0 5.0 - 8.0   Glucose, UA NEGATIVE NEGATIVE mg/dL   Hgb urine dipstick NEGATIVE NEGATIVE   Bilirubin Urine NEGATIVE NEGATIVE   Ketones, ur NEGATIVE NEGATIVE mg/dL   Protein, ur 30 (A) NEGATIVE mg/dL   Nitrite NEGATIVE NEGATIVE   Leukocytes, UA NEGATIVE NEGATIVE   RBC / HPF 0-5 0 - 5 RBC/hpf   WBC, UA 0-5 0 - 5 WBC/hpf   Bacteria, UA NONE SEEN NONE SEEN   Squamous Epithelial / LPF 0-5 (A) NONE SEEN   Mucous PRESENT   Comprehensive metabolic panel  Result Value Ref Range   Sodium 137 135 - 145 mmol/L   Potassium 3.4 (L) 3.5 - 5.1 mmol/L   Chloride 103 101 - 111 mmol/L   CO2 25 22 - 32 mmol/L   Glucose, Bld 93 65 - 99 mg/dL   BUN 13 6 - 20 mg/dL   Creatinine, Ser 0.69 0.44 - 1.00 mg/dL   Calcium 9.3 8.9 - 10.3 mg/dL   Total Protein 7.5 6.5 - 8.1 g/dL   Albumin 3.4 (L) 3.5 - 5.0 g/dL   AST 16 15 - 41 U/L   ALT 9 (L) 14 - 54 U/L   Alkaline Phosphatase 71 38 - 126 U/L   Total Bilirubin 0.6 0.3 - 1.2 mg/dL   GFR calc non Af Amer >60 >60 mL/min   GFR calc Af Amer >60 >60 mL/min   Anion gap 9 5 - 15     EKG  EKG Interpretation None       Radiology No results found.  Procedures Procedures (including critical care time)  Medications Ordered in ED Medications  0.9 %  sodium chloride infusion (not administered)     Initial Impression / Assessment and Plan / ED Course  I have reviewed the triage vital signs and the  nursing notes.  Pertinent labs & imaging results that were available during my care of the patient were reviewed by me and considered in my medical decision making (see chart for details).     Patient presents with family who is ultimately concerned with declining mental function. They report she talks to deceased family members. She holds the door for people who are not there. This has been happening over time.  The  patient has no new symptoms today. She is oriented x 2. Discussed whether she is interested in nursing home placement. She states she is aware she is confused sometimes but that she is not ready for placement and feels she is ok to stay where she is.   She can be discharged home. Family will be able to pick her up but not til morning  Final Clinical Impressions(s) / ED Diagnoses   Final diagnoses:  None   1. Weakness 2. Declining mental function  New Prescriptions New Prescriptions   No medications on file     Charlann Lange, Hershal Coria 03/02/17 [id number]    Malvin Johns, MD 03/02/17 1348

## 2017-03-01 NOTE — ED Notes (Signed)
pt vauge with complaints jumping from one topic to another. family at bedside reports they are concerned about pt not being able to take care of herself. Pt told this Probation officer she lives with her sisters, sisters at bedside reprot pt lives with her granddaughter. Family at bedside also reports pt has been "talking in the past and opening doors for people who are not there". Family also reports that pt. Stated she was out of her BP medication, when the family called [facility] it was reported that it was not time for pt to have medication filled.

## 2017-03-02 ENCOUNTER — Emergency Department (HOSPITAL_COMMUNITY): Payer: Medicare Other

## 2017-03-02 ENCOUNTER — Encounter (HOSPITAL_COMMUNITY): Payer: Self-pay | Admitting: Emergency Medicine

## 2017-03-02 LAB — URINALYSIS, ROUTINE W REFLEX MICROSCOPIC
BACTERIA UA: NONE SEEN
BILIRUBIN URINE: NEGATIVE
Glucose, UA: NEGATIVE mg/dL
HGB URINE DIPSTICK: NEGATIVE
Ketones, ur: NEGATIVE mg/dL
LEUKOCYTES UA: NEGATIVE
NITRITE: NEGATIVE
PROTEIN: 30 mg/dL — AB
SPECIFIC GRAVITY, URINE: 1.023 (ref 1.005–1.030)
pH: 6 (ref 5.0–8.0)

## 2017-03-02 NOTE — ED Notes (Signed)
Daughter to come and get patient around 7 to 8 am this morning. Patient continues to rest quietly with no distress at this time.  Sisters left several hours ago.

## 2017-03-02 NOTE — ED Notes (Signed)
Pt moved to inpatient stretcher for comfort. Pt found to have very poor hygiene, old hospital socks appeared to be stuck to her feet, with copious amounts of dry, peeling skin. Toenails extend beyond toes and curl. Pt appears to not have bathed in some time, Foul odor noted, unsure if odor is present from patient or clothing. Clean socks placed on patient. Will continue to monitor.

## 2017-03-02 NOTE — ED Notes (Addendum)
Pt. Sisters left for the night Call once pt. Is either admitted or d/c.  [phone] : Mignon Pine  [phone]: Bascom Levels  If pt. Is d/c, the sisters can not pick her up until its light outside

## 2017-03-02 NOTE — ED Triage Notes (Signed)
PT 's family at bed side to take PT home. DC papers reviewed with Pt's Daughter . Pt has several Appt.'s in July . Daughter is aware.

## 2017-03-03 ENCOUNTER — Telehealth: Payer: Self-pay | Admitting: Family

## 2017-03-03 LAB — URINE CULTURE: CULTURE: NO GROWTH

## 2017-03-03 NOTE — Telephone Encounter (Signed)
[facility] Patient Name: [name] DOB: 1946/07/01 Initial Comment Caller states her sister has a wound on her leg. She took her to the ER on Saturday- DX- weak and Dehydrated. Nurse Assessment Nurse: Markus Daft, RN, Sherre Poot Date/Time [name] Time): 03/03/2017 2:13:49 PM Confirm and document reason for call. If symptomatic, describe symptoms. ---Caller states her sister missed the chair when trying to sit as she turned around too quickly, and family couldn't get her up off the floor. Talking about things that happened in the past. -- She is now walking around with a cane. No apparent injury. Did not hit her head. RN verified that she is oriented x 4. -- She has h/o wound on her left leg. She took her to the ER on Saturday, and diagnosed with weakness and dehydration. Given IV fluids, labs, and x-rays. Discharged from ER same day. Nothing done to wound on leg. -- Family asks about assistance with ADL's and to make sure that she doesn't hurt herself. RN advised home health wouldn't be able to stay all day, only a short visit. Caller verbalized understanding. Does the patient have any new or worsening symptoms? ---Yes Will a triage be completed? ---Yes Related visit to physician within the last 2 weeks? ---Yes Does the PT have any chronic conditions? (i.e. diabetes, asthma, etc.) ---Yes List chronic conditions. ---HTN Is this a behavioral health or substance abuse call? ---No Guidelines Guideline Title Affirmed Question Affirmed Notes Weakness (Generalized) and Fatigue Taking a medicine that could cause weakness (e.g., blood pressure medications, diuretics) Final Disposition User See Physician within [facility], RN, Sherre Poot Comments (She has appt on 03/13/17 with MD at 3:30 pm.) Dr. Elna Breslow has no appts for today or tomorrow. RN scheduled with Dr. Cathlean Cower 03/04/17 at 11:15  am. Referrals REFERRED TO PCP OFFICE Disagree/Comply: Comply

## 2017-03-04 ENCOUNTER — Ambulatory Visit: Payer: Self-pay | Admitting: Internal Medicine

## 2017-03-13 ENCOUNTER — Ambulatory Visit: Payer: Medicare Other | Admitting: Family

## 2017-03-18 ENCOUNTER — Ambulatory Visit: Payer: Medicare Other | Admitting: Family

## 2017-03-24 ENCOUNTER — Ambulatory Visit (INDEPENDENT_AMBULATORY_CARE_PROVIDER_SITE_OTHER): Payer: Medicare Other | Admitting: Family

## 2017-03-24 ENCOUNTER — Encounter: Payer: Self-pay | Admitting: Family

## 2017-03-24 ENCOUNTER — Other Ambulatory Visit (INDEPENDENT_AMBULATORY_CARE_PROVIDER_SITE_OTHER): Payer: Medicare Other

## 2017-03-24 VITALS — BP 148/88 | HR 104 | Temp 99.2°F | Resp 16 | Ht 67.0 in

## 2017-03-24 DIAGNOSIS — E11620 Type 2 diabetes mellitus with diabetic dermatitis: Secondary | ICD-10-CM | POA: Diagnosis not present

## 2017-03-24 DIAGNOSIS — Z658 Other specified problems related to psychosocial circumstances: Secondary | ICD-10-CM

## 2017-03-24 DIAGNOSIS — I872 Venous insufficiency (chronic) (peripheral): Secondary | ICD-10-CM | POA: Diagnosis not present

## 2017-03-24 DIAGNOSIS — L309 Dermatitis, unspecified: Secondary | ICD-10-CM | POA: Insufficient documentation

## 2017-03-24 DIAGNOSIS — L608 Other nail disorders: Secondary | ICD-10-CM | POA: Diagnosis not present

## 2017-03-24 DIAGNOSIS — Z789 Other specified health status: Secondary | ICD-10-CM | POA: Insufficient documentation

## 2017-03-24 MED ORDER — TRAMADOL HCL 50 MG PO TABS: 50.0000 mg | ORAL_TABLET | Freq: Three times a day (TID) | ORAL | 0 refills | Status: DC | PRN

## 2017-03-24 MED ORDER — CLOBETASOL PROPIONATE 0.05 % EX CREA: 1.0000 "application " | TOPICAL_CREAM | Freq: Two times a day (BID) | CUTANEOUS | 1 refills | Status: DC

## 2017-03-24 NOTE — Assessment & Plan Note (Signed)
Continues to experience venous insufficiency and maintained on furosemide. Does have small open wound of right calf with serous discharge most likely associated from venous insufficiency. Wound wrapped with bandage and shortens for basic wound care provided. Elevate legs when seated. Decrease sodium in diet. Will obtain ankle brachial index to rule out peripheral artery disease.

## 2017-03-24 NOTE — Assessment & Plan Note (Signed)
Previously diagnosed with eczema exacerbation with significant dry skin and cracking noted of the bilateral lower extremities. Restart clobetasol cream and refer to dermatology for further assessment and treatment.

## 2017-03-24 NOTE — Assessment & Plan Note (Signed)
Toenail deformity secondary to poor basic foot care in the setting of type 2 diabetes. Refer to podiatry for further assessment and treatment.

## 2017-03-24 NOTE — Assessment & Plan Note (Addendum)
Per patient family there is significant decreased independence with activities of daily living requiring assistance in most areas as noted in history of present illness. There is concern for patient's safety and ability to care for herself independently with request for evaluation for possible assisted living facility. Refer to social work for further evaluation. Given decreased mobility consider physical therapy if symptoms are not improved. Patient is not wishing to go to assisted living at this time but may consider in the future.

## 2017-03-24 NOTE — Assessment & Plan Note (Signed)
Obtain hemoglobin A1c and complete metabolic profile. Previously managed with lifestyle. Obtain ankle brachial index to rule out peripheral artery disease in the setting of known venous insufficiency.

## 2017-03-24 NOTE — Progress Notes (Signed)
Subjective:    Patient ID: [name], female    DOB: 1946/06/03, 71 y.o.   MRN: [id number]  Chief Complaint  Patient presents with  . Leg check    leg check and potassium, assisted living facility, unable to take care of herself, wants something initiated ASAP    HPI:  [name] is a 71 y.o. female who  has a past medical history of Acute pericarditis, unspecified; Arthritis; Arthritis; Bilateral swelling of feet; Cellulitis; Chronic anemia; Chronic knee pain; Closed fracture of styloid process of ulna; Cognitive decline; Decreased appetite; Diabetes mellitus; Distal radius fracture, left; Distal radius fracture, left; ECG abnormal; Fall; Fever; Hypertension; Hypertension; Hypoglycemia; Knee pain, chronic; Left scapholunate ligament tear; Lower extremity edema; Malnutrition of moderate degree ([place]); Mass of right side of neck; Microcytic anemia; Polio; Polio; Radius fracture; Severe protein-calorie malnutrition ([place]); Swelling of joint, knee, right; Type 2 diabetes mellitus, controlled ([place]); and Venous insufficiency. and presents today for a follow up office visit. Patient's sister and niece are present and provide aspects of the history.    Previously evaluated in the ED and diagnosed with vasculitis and also a rash consisting with eczematous eruption that was treated with clobetasol, silver sulfadizaine. Has since ran out of medication and notes a rash located on her bilateral lower legs. Describes her legs as painful at times. There is concern for her abilities to take care of herself. Moving around her house is difficult with decreased ability to stand by herself. She does live with her granddaughter who does not help very much. Usually has food brought in and does not prepare foods independently. For toileting she is using Depends and does generally need assistance. Does have some family assistance with showering / bathing that she is not able to do independently. Able to stand  for about 7-10 minutes at a time with rest. She does have a walker and a wheelchair. She already has fallen several times. Does have difficulty with getting to and from office visits. Shopping is performed by family members. Describes that her feet hurt all the time.  Allergies  Allergen Reactions  . Orange Concentrate [Flavoring Agent] Shortness Of Breath, Itching and Swelling  . Peach [Prunus Persica] Shortness Of Breath, Itching and Swelling  . Penicillins Anaphylaxis and Hives    All 'cillins' Has patient had a PCN reaction causing immediate rash, facial/tongue/throat swelling, SOB or lightheadedness with hypotension: Yes Has patient had a PCN reaction causing severe rash involving mucus membranes or skin necrosis: No Has patient had a PCN reaction that required hospitalization No Has patient had a PCN reaction occurring within the last 10 years: No If all of the above answers are "NO", then may proceed with Cephalosporin use.   . Strawberry Extract Shortness Of Breath, Itching and Swelling  . Adhesive [Tape] Itching and Rash    Please use "paper" tape  . Aspirin Itching  . Latex Itching and Rash  . Other Hives, Itching and Rash    NO "-CILLINS"  . Pineapple Itching and Rash  . Strawberry Flavor Itching and Rash      Outpatient Medications Prior to Visit  Medication Sig Dispense Refill  . Acetaminophen (TYLENOL) 325 MG CAPS Take 325-650 mg by mouth every 6 (six) hours as needed (for pain).    . clindamycin (CLEOCIN) 150 MG capsule Take 150 mg by mouth 3 (three) times daily.    . furosemide (LASIX) 40 MG tablet TAKE 1 TABLET BY MOUTH EVERY DAY 30 tablet 0  .  mupirocin ointment (BACTROBAN) 2 % Place 1 application into the nose 2 (two) times daily. 22 g 0  . silver sulfADIAZINE (SILVADENE) 1 % cream Apply 1 application topically daily.    . clobetasol cream (TEMOVATE) 1.02 % Apply 1 application topically 2 (two) times daily.     No facility-administered medications prior to visit.        Past Surgical History:  Procedure Laterality Date  . ABDOMINAL HYSTERECTOMY    . OPEN REDUCTION INTERNAL FIXATION (ORIF) DISTAL RADIAL FRACTURE Left 02/29/2016   Procedure: LEFT DISTAL RADIUS REPAIR OF MALUNION WITH OPEN REDUCTION INTERNAL FIXATION (ORIF);  Surgeon: Iran Planas, MD;  Location: [facility];  Service: Orthopedics;  Laterality: Left;      Past Medical History:  Diagnosis Date  . Acute pericarditis, unspecified   . Arthritis   . Arthritis   . Bilateral swelling of feet   . Cellulitis   . Chronic anemia   . Chronic knee pain    right  . Closed fracture of styloid process of ulna   . Cognitive decline   . Decreased appetite   . Diabetes mellitus    A1C has been normal for past 2 years and takes no diabetic meds at present  . Distal radius fracture, left   . Distal radius fracture, left   . ECG abnormal   . Fall   . Fever   . Hypertension   . Hypertension   . Hypoglycemia   . Knee pain, chronic   . Left scapholunate ligament tear   . Lower extremity edema   . Malnutrition of moderate degree ([place])   . Mass of right side of neck   . Microcytic anemia   . Polio   . Polio   . Radius fracture   . Severe protein-calorie malnutrition ([place])   . Swelling of joint, knee, right   . Type 2 diabetes mellitus, controlled ([place])   . Venous insufficiency       Review of Systems  Constitutional: Negative for chills and fever.  Respiratory: Negative for chest tightness and shortness of breath.   Cardiovascular: Negative for chest pain, palpitations and leg swelling.  Musculoskeletal:       Positive for bilateral leg and foot pain.   Skin: Positive for color change and rash.  Neurological: Positive for weakness.      Objective:    BP (!) 148/88 (BP Location: Left Arm, Patient Position: Sitting, Cuff Size: Large)   Pulse (!) 104   Temp 99.2 F (37.3 C) (Oral)   Resp 16   Ht _0  (1.702 m)   SpO2 91%  Nursing note and vital signs reviewed.  Physical  Exam  Constitutional: She is oriented to person, place, and time. She appears well-developed and well-nourished. No distress.  Seated in the wheelchair.   Cardiovascular: Normal rate, regular rhythm, normal heart sounds and intact distal pulses.   Pulmonary/Chest: Effort normal and breath sounds normal.  Neurological: She is alert and oriented to person, place, and time.  Skin: Skin is warm and dry.  Bilateral lower extremities with increased discoloration approximately halfway down the distal lower extremity. There is a quarter sized open wound with serous discharge not on the lateral aspect of the right calf. Skin is firm to the touch and mildly tender. Lower foot and ankle are dry. There is decreased motion of the bilateral lower extremities secondary to pain. Toenails appear deformed and are poorly trimmed.   Psychiatric: She has a normal mood and  affect. Her behavior is normal. Judgment and thought content normal.       Assessment & Plan:   Problem List Items Addressed This Visit      Cardiovascular and Mediastinum   Venous insufficiency of both lower extremities, chronic - Primary    Continues to experience venous insufficiency and maintained on furosemide. Does have small open wound of right calf with serous discharge most likely associated from venous insufficiency. Wound wrapped with bandage and shortens for basic wound care provided. Elevate legs when seated. Decrease sodium in diet. Will obtain ankle brachial index to rule out peripheral artery disease.        Endocrine   Type 2 diabetes mellitus, controlled (HCC)    Obtain hemoglobin A1c and complete metabolic profile. Previously managed with lifestyle. Obtain ankle brachial index to rule out peripheral artery disease in the setting of known venous insufficiency.      Relevant Orders   Ambulatory referral to Podiatry   Ambulatory referral to Dermatology   VAS [place] ABI WITH/WO TBI   Hemoglobin A1c   Comp Met (CMET)      Musculoskeletal and Integument   Eczema    Previously diagnosed with eczema exacerbation with significant dry skin and cracking noted of the bilateral lower extremities. Restart clobetasol cream and refer to dermatology for further assessment and treatment.      Relevant Orders   Ambulatory referral to Dermatology   Toenail deformity    Toenail deformity secondary to poor basic foot care in the setting of type 2 diabetes. Refer to podiatry for further assessment and treatment.        Other   Decreased independence with activities of daily living    Per patient family there is significant decreased independence with activities of daily living requiring assistance in most areas as noted in history of present illness. There is concern for patient's safety and ability to care for herself independently with request for evaluation for possible assisted living facility. Refer to social work for further evaluation. Given decreased mobility consider physical therapy if symptoms are not improved. Patient is not wishing to go to assisted living at this time but may consider in the future.      Relevant Orders   Ambulatory referral to Social Work       I am having Ms. Konen start on traMADol. I am also having her maintain her Acetaminophen, mupirocin ointment, silver sulfADIAZINE, clindamycin, furosemide, and clobetasol cream.   Meds ordered this encounter  Medications  . clobetasol cream (TEMOVATE) 0.05 %    Sig: Apply 1 application topically 2 (two) times daily.    Dispense:  45 g    Refill:  1    Order Specific Question:   Supervising Provider    Answer:   Pricilla Holm A [[id number]]  . traMADol (ULTRAM) 50 MG tablet    Sig: Take 1 tablet (50 mg total) by mouth every 8 (eight) hours as needed.    Dispense:  30 tablet    Refill:  0    Order Specific Question:   Supervising Provider    Answer:   Pricilla Holm A [[id number]]     Follow-up: Return in about 2 months (around  05/25/2017), or if symptoms worsen or fail to improve.  Mauricio Po, FNP

## 2017-03-24 NOTE — Patient Instructions (Signed)
Thank you for choosing [facility].  SUMMARY AND INSTRUCTIONS:  Keep the open wound clean, dry and covered.   Keep legs elevated when seated.  They will call to schedule your appointment with dermatology, social work and podiatry.   They will call to schedule the ankle-brachial index.   Start tramadol as needed for pain.  Medication:  Your prescription(s) have been submitted to your pharmacy or been printed and provided for you. Please take as directed and contact our office if you believe you are having problem(s) with the medication(s) or have any questions.  Labs:  Please stop by the lab on the lower level of the building for your blood work. Your results will be released to [facility] (or called to you) after review, usually within 72 hours after test completion. If any changes need to be made, you will be notified at that same time.  1.) The lab is open from 7:30am to 5:30 pm Monday-Friday 2.) No appointment is necessary 3.) Fasting (if needed) is 6-8 hours after food and drink; black coffee and water are okay   Referrals:  Referrals have been made during this visit. You should expect to hear back from our schedulers in about 7-10 days in regards to establishing an appointment with the specialists we discussed.   Follow up:  If your symptoms worsen or fail to improve, please contact our office for further instruction, or in case of emergency go directly to the emergency room at the closest medical facility.

## 2017-03-25 LAB — COMPREHENSIVE METABOLIC PANEL
ALK PHOS: 77 U/L (ref 39–117)
ALT: 8 U/L (ref 0–35)
AST: 17 U/L (ref 0–37)
Albumin: 3.6 g/dL (ref 3.5–5.2)
BUN: 10 mg/dL (ref 6–23)
CO2: 29 meq/L (ref 19–32)
Calcium: 9.8 mg/dL (ref 8.4–10.5)
Chloride: 102 mEq/L (ref 96–112)
Creatinine, Ser: 0.72 mg/dL (ref 0.40–1.20)
GFR: 102.02 mL/min (ref >60.00)
GLUCOSE: 86 mg/dL (ref 70–99)
POTASSIUM: 4 meq/L (ref 3.5–5.1)
SODIUM: 138 meq/L (ref 135–145)
TOTAL PROTEIN: 8 g/dL (ref 6.0–8.3)
Total Bilirubin: 0.7 mg/dL (ref 0.2–1.2)

## 2017-03-25 LAB — HEMOGLOBIN A1C: HEMOGLOBIN A1C: 5.2 % (ref 4.6–6.5)

## 2017-03-26 ENCOUNTER — Telehealth: Payer: Self-pay | Admitting: Family

## 2017-03-26 ENCOUNTER — Ambulatory Visit: Payer: Medicare Other | Admitting: Family

## 2017-03-26 ENCOUNTER — Other Ambulatory Visit: Payer: Self-pay | Admitting: Family

## 2017-03-26 NOTE — Telephone Encounter (Signed)
Called stating that FL2 was dropped off to be completed on Monday.  States that Social Services would like to have this form tomorrow at an appt.  Would like to get form as soon as possible.  Please follow up in regard.

## 2017-03-27 DIAGNOSIS — Z0279 Encounter for issue of other medical certificate: Secondary | ICD-10-CM

## 2017-03-27 NOTE — Telephone Encounter (Signed)
FL2 done and picked up by linda.

## 2017-03-28 NOTE — Telephone Encounter (Signed)
Medication faxed

## 2017-03-31 ENCOUNTER — Encounter: Payer: Self-pay | Admitting: Vascular Surgery

## 2017-04-01 ENCOUNTER — Other Ambulatory Visit: Payer: Self-pay | Admitting: Family

## 2017-04-03 ENCOUNTER — Ambulatory Visit: Payer: Medicare Other | Admitting: Family

## 2017-04-03 DIAGNOSIS — Z8612 Personal history of poliomyelitis: Secondary | ICD-10-CM | POA: Diagnosis not present

## 2017-04-03 DIAGNOSIS — Z658 Other specified problems related to psychosocial circumstances: Secondary | ICD-10-CM | POA: Diagnosis not present

## 2017-04-03 DIAGNOSIS — M1380 Other specified arthritis, unspecified site: Secondary | ICD-10-CM | POA: Diagnosis not present

## 2017-04-03 DIAGNOSIS — D649 Anemia, unspecified: Secondary | ICD-10-CM | POA: Diagnosis not present

## 2017-04-03 DIAGNOSIS — L309 Dermatitis, unspecified: Secondary | ICD-10-CM | POA: Diagnosis not present

## 2017-04-03 DIAGNOSIS — R700 Elevated erythrocyte sedimentation rate: Secondary | ICD-10-CM | POA: Diagnosis not present

## 2017-04-03 DIAGNOSIS — R600 Localized edema: Secondary | ICD-10-CM | POA: Diagnosis not present

## 2017-04-03 DIAGNOSIS — E1129 Type 2 diabetes mellitus with other diabetic kidney complication: Secondary | ICD-10-CM | POA: Diagnosis not present

## 2017-04-03 DIAGNOSIS — R5381 Other malaise: Secondary | ICD-10-CM | POA: Diagnosis not present

## 2017-04-03 DIAGNOSIS — S52502D Unspecified fracture of the lower end of left radius, subsequent encounter for closed fracture with routine healing: Secondary | ICD-10-CM | POA: Diagnosis not present

## 2017-04-03 DIAGNOSIS — R221 Localized swelling, mass and lump, neck: Secondary | ICD-10-CM | POA: Diagnosis not present

## 2017-04-03 DIAGNOSIS — E11620 Type 2 diabetes mellitus with diabetic dermatitis: Secondary | ICD-10-CM | POA: Diagnosis not present

## 2017-04-03 DIAGNOSIS — R4189 Other symptoms and signs involving cognitive functions and awareness: Secondary | ICD-10-CM | POA: Diagnosis not present

## 2017-04-03 DIAGNOSIS — R627 Adult failure to thrive: Secondary | ICD-10-CM | POA: Diagnosis not present

## 2017-04-03 DIAGNOSIS — R739 Hyperglycemia, unspecified: Secondary | ICD-10-CM | POA: Diagnosis not present

## 2017-04-03 DIAGNOSIS — I872 Venous insufficiency (chronic) (peripheral): Secondary | ICD-10-CM | POA: Diagnosis not present

## 2017-04-03 DIAGNOSIS — I10 Essential (primary) hypertension: Secondary | ICD-10-CM | POA: Diagnosis not present

## 2017-04-03 DIAGNOSIS — I739 Peripheral vascular disease, unspecified: Secondary | ICD-10-CM | POA: Diagnosis not present

## 2017-04-04 ENCOUNTER — Encounter: Payer: Self-pay | Admitting: Adult Health

## 2017-04-04 ENCOUNTER — Non-Acute Institutional Stay (SKILLED_NURSING_FACILITY): Payer: Medicare Other | Admitting: Adult Health

## 2017-04-04 DIAGNOSIS — R600 Localized edema: Secondary | ICD-10-CM

## 2017-04-04 DIAGNOSIS — E11620 Type 2 diabetes mellitus with diabetic dermatitis: Secondary | ICD-10-CM

## 2017-04-04 DIAGNOSIS — R627 Adult failure to thrive: Secondary | ICD-10-CM

## 2017-04-04 DIAGNOSIS — L309 Dermatitis, unspecified: Secondary | ICD-10-CM

## 2017-04-04 DIAGNOSIS — R5381 Other malaise: Secondary | ICD-10-CM

## 2017-04-04 NOTE — Progress Notes (Addendum)
Location:   [facility] Room Number: 110 A Place of Service:  SNF (31)   CODE STATUS: Full Code  Allergies  Allergen Reactions  . Orange Concentrate [Flavoring Agent] Shortness Of Breath, Itching and Swelling  . Peach [Prunus Persica] Shortness Of Breath, Itching and Swelling  . Penicillins Anaphylaxis and Hives    All 'cillins' Has patient had a PCN reaction causing immediate rash, facial/tongue/throat swelling, SOB or lightheadedness with hypotension: Yes Has patient had a PCN reaction causing severe rash involving mucus membranes or skin necrosis: No Has patient had a PCN reaction that required hospitalization No Has patient had a PCN reaction occurring within the last 10 years: No If all of the above answers are "NO", then may proceed with Cephalosporin use.   . Strawberry Extract Shortness Of Breath, Itching and Swelling  . Adhesive [Tape] Itching and Rash    Please use "paper" tape  . Aspirin Itching  . Latex Itching and Rash  . Other Hives, Itching and Rash    NO "-CILLINS"  . Pineapple Itching and Rash  . Strawberry Flavor Itching and Rash    Chief Complaint  Patient presents with  . Acute Visit    Transfer In    HPI:  She is being transferred from home.  She has been having increased difficulty managing her adls at home. She has been having chronic bilateral lower extremity pain. She is a poor historian and will frequently repeat herself. Her tells me that she has been getting more confused and forgetful over the past several months. She will need a cognitive work up for underlying dementia. At this time her goal is to return back home. I am not certain if that will be possible; unless someone is with her.    Past Medical History:  Diagnosis Date  . Acute pericarditis, unspecified   . Arthritis   . Arthritis   . Bilateral swelling of feet   . Cellulitis   . Chronic anemia   . Chronic knee pain    right  . Closed fracture of styloid process of  ulna   . Cognitive decline   . Decreased appetite   . Diabetes mellitus    A1C has been normal for past 2 years and takes no diabetic meds at present  . Distal radius fracture, left   . Distal radius fracture, left   . ECG abnormal   . Fall   . Fever   . Hypertension   . Hypertension   . Hypoglycemia   . Knee pain, chronic   . Left scapholunate ligament tear   . Lower extremity edema   . Malnutrition of moderate degree ([place])   . Mass of right side of neck   . Microcytic anemia   . Polio   . Polio   . Radius fracture   . Severe protein-calorie malnutrition ([place])   . Swelling of joint, knee, right   . Type 2 diabetes mellitus, controlled ( [place])   . Venous insufficiency     Past Surgical History:  Procedure Laterality Date  . ABDOMINAL HYSTERECTOMY    . OPEN REDUCTION INTERNAL FIXATION (ORIF) DISTAL RADIAL FRACTURE Left 02/29/2016   Procedure: LEFT DISTAL RADIUS REPAIR OF MALUNION WITH OPEN REDUCTION INTERNAL FIXATION (ORIF);  Surgeon: Iran Planas, MD;  Location: [facility];  Service: Orthopedics;  Laterality: Left;    Social History   Social History  . Marital status: Widowed    Spouse name: N/A  . Number of children: 2  .  Years of education: 86   Occupational History  . Not on file.   Social History Main Topics  . Smoking status: Former Smoker    Packs/day: 0.50    Types: Cigarettes    Quit date: 09/09/1968  . Smokeless tobacco: Never Used  . Alcohol use No  . Drug use: No  . Sexual activity: No   Other Topics Concern  . Not on file   Social History Narrative   Has family who helps her get around.     Fun: Read   Denies abuse and feel safe at home.   Family History  Problem Relation Age of Onset  . Ovarian cancer Mother   . COPD Father   . Hypertension Unknown   . High Cholesterol Unknown   . Diabetes Mellitus II Neg Hx   . Lupus Neg Hx   . Sarcoidosis Neg Hx       VITAL SIGNS BP 125/70   Pulse (!) 113   Temp (!) 97.3 F (36.3 C)   Resp 18    Ht 5\' 7"  (1.702 m)   Wt 199 lb 8 oz (90.5 kg)   SpO2 95%   BMI 31.25 kg/m   Patient's Medications  New Prescriptions   No medications on file  Previous Medications   CLOBETASOL CREAM (TEMOVATE) 0.05 %    Apply 1 application topically 2 (two) times daily.   FUROSEMIDE (LASIX) 40 MG TABLET    TAKE 1 TABLET BY MOUTH EVERY DAY   SILVER SULFADIAZINE (SILVADENE) 1 % CREAM    Apply 1 application topically daily. Apply to Right Leg Wound   TRAMADOL (ULTRAM) 50 MG TABLET    TAKE 1 TABLET EVERY 8 HOURS AS NEEDED FOR PAIN  Modified Medications   No medications on file  Discontinued Medications   ACETAMINOPHEN (TYLENOL) 325 MG CAPS    Take 325-650 mg by mouth every 6 (six) hours as needed (for pain).   CLINDAMYCIN (CLEOCIN) 150 MG CAPSULE    Take 150 mg by mouth 3 (three) times daily.   MUPIROCIN OINTMENT (BACTROBAN) 2 %    Place 1 application into the nose 2 (two) times daily.     SIGNIFICANT DIAGNOSTIC EXAMS  TODAY:   03-02-17: chest x-ray: No active cardiopulmonary disease. Cardiomegaly.  LABS REVIEWED:   TODAY:   03-01-17: wbc 5.4; hgb 10.5; hct 34.8; mcv 73.4; plt 189; glucose 93; bun 13; creat 0.69; k+ 3.4; na++ 137; ca 9.3; liver normal albumin 3.4; urine culture: no growth 03-24-17: glucose 86; bun 10; creat 0.72; k+ 4.0; na++ 138; ca 9.3; liver normal albumin 3.6     Review of Systems  Reason unable to perform ROS: poor historian   Constitutional: Negative for malaise/fatigue.  Respiratory: Negative for cough and shortness of breath.   Cardiovascular: Positive for leg swelling. Negative for chest pain.  Gastrointestinal: Negative for abdominal pain, constipation and heartburn.  Musculoskeletal: Negative for back pain, joint pain and myalgias.  Skin: Negative.   Neurological: Negative for dizziness.  Psychiatric/Behavioral: The patient is not nervous/anxious.      Physical Exam  Constitutional: She appears well-developed and well-nourished. No distress.  HENT:  Has a  large salivary gland cyst present   Eyes: Conjunctivae are normal.  Neck: Neck supple. No JVD present. No thyromegaly present.  Cardiovascular: Normal rate, regular rhythm and intact distal pulses.   Respiratory: Effort normal and breath sounds normal. No respiratory distress. She has no wheezes.  GI: Soft. Bowel sounds are normal. She exhibits  no distension. There is no tenderness.  Musculoskeletal: She exhibits edema.  Able to move all extremities  Has weakness in right leg Has edema in bilateral lower extremities with shiny skin  Has weaker grip on left with little movement of left 4-5th fingers   Lymphadenopathy:    She has no cervical adenopathy.  Neurological: She is alert.  Does not know location; year; month   Skin: Skin is warm and dry. She is not diaphoretic.  Right outer calf with venous ulceration without signs of infection present  Left 4th finger with blackened nail bed (with old blood present)   Has eczema to bilateral lower legs and bilateral hands    Psychiatric: She has a normal mood and affect.     ASSESSMENT/ PLAN:  TODAY:  1. Bilateral lower extremity chronic venous insufficiency: no change in status  has right lower extremity venous ulceration:  Has ultram 50 mg every 8 hour as needed for pain management. will continue silvadene to wound; will use ace wraps below knees to help with mild compression. Will get ABI  2. Eczema: no change in status  to bilateral hands and lower extremities: will continue clobetasol to hands and lower legs.  3. Bilateral lower extremity edema: is without change; will continue lasix 40 mg daily   4. Diabetes: diet controlled; is stable hgb a1c 5.2; will not make changes  5. Decreased mobility: physical deconditioning; failure to thrive in adult: will continue therapy as indicated to improve mobility; strength; balance; adl independence and cognition.    6. Anemia of chronic disease: is without change hgb 10.5; is not on  medications; will monitor   Will obtain x-ray of left hand   Time spent with patient 45   minutes >50% time spent counseling (patient and family regarding discharge process; therapy and goals of care; did verbalize understanding); reviewing medical record; tests; labs; and developing future plan of care         MD is aware of resident's narcotic use and is in agreement with current plan of care. We will attempt to wean resident as apropriate   Ok Edwards NP [facility]  Contact [phone] Monday through Friday 8am- 5pm  After hours call [phone]

## 2017-04-07 ENCOUNTER — Encounter: Payer: Self-pay | Admitting: Internal Medicine

## 2017-04-07 ENCOUNTER — Non-Acute Institutional Stay (SKILLED_NURSING_FACILITY): Payer: Medicare Other | Admitting: Internal Medicine

## 2017-04-07 DIAGNOSIS — R600 Localized edema: Secondary | ICD-10-CM

## 2017-04-07 DIAGNOSIS — R700 Elevated erythrocyte sedimentation rate: Secondary | ICD-10-CM

## 2017-04-07 DIAGNOSIS — D649 Anemia, unspecified: Secondary | ICD-10-CM

## 2017-04-07 DIAGNOSIS — R5381 Other malaise: Secondary | ICD-10-CM

## 2017-04-07 DIAGNOSIS — R627 Adult failure to thrive: Secondary | ICD-10-CM

## 2017-04-07 DIAGNOSIS — I872 Venous insufficiency (chronic) (peripheral): Secondary | ICD-10-CM

## 2017-04-07 DIAGNOSIS — R4189 Other symptoms and signs involving cognitive functions and awareness: Secondary | ICD-10-CM | POA: Diagnosis not present

## 2017-04-07 DIAGNOSIS — R739 Hyperglycemia, unspecified: Secondary | ICD-10-CM

## 2017-04-07 DIAGNOSIS — L309 Dermatitis, unspecified: Secondary | ICD-10-CM

## 2017-04-07 DIAGNOSIS — R221 Localized swelling, mass and lump, neck: Secondary | ICD-10-CM

## 2017-04-07 NOTE — Progress Notes (Signed)
Patient ID: [name], female   DOB: Jun 25, 1946, 71 y.o.   MRN: [id number]    HISTORY AND PHYSICAL   DATE: 04/07/2017  Location:    [facility] Room Number: 110 A Place of Service: SNF (31)   Extended Emergency Contact Information Primary Emergency Contact: Lynetta M[place] of [place] Mobile Phone: [phone] Relation: Niece Secondary Emergency Contact: [place] Mobile Phone: [phone] Relation: Sister  Advanced Directive information Does Patient Have a Medical Advance Directive?: No, Would patient like information on creating a medical advance directive?: No - Patient declined  Chief Complaint  Patient presents with  . New Admit To SNF    New Admit    HPI:  71 yo female seen today as a new admission into SNF from home. She will be a long term resident. She was seen in the ED 03/01/17 for generalized weakness and declining cognition. She was admitted to hospital and tx for impetigo in Mar 2018. Albumin 3.6; sed rate 45; A1c 5.2%.  Today she reports no concerns. She has weakness. She states she is ready to go home. She is a poor historian due to memory loss. 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 ABI in May 2017 >1 b/l.  Eczematous dermatitis - currently affects b/l hands and lower extremities. Stable with clobetasol topically.  Chronic BLE edema - stable on lasix 40 mg daily   DM - diet controlled. A1c 5.2%    Anemia of chronic disease - stable. Hgb 10.5  Right neck mass -  Noted in 11/2016 to likely be a lipoma   Past Medical History:  Diagnosis Date  . Acute pericarditis, unspecified   . Arthritis   . Arthritis   . Bilateral swelling of feet   . Cellulitis   . Chronic anemia   . Chronic knee pain     right  . Closed fracture of styloid process of ulna   . Cognitive decline   . Decreased appetite   . Diabetes mellitus    A1C has been normal for past 2 years and takes no diabetic meds at present  . Distal radius fracture 02/04/2016  . Distal radius fracture, left   . Distal radius fracture, left   . ECG abnormal   . Fall   . Fever   . Hypertension   . Hypertension   . Hypoglycemia   . Knee pain, chronic   . Left scapholunate ligament tear   . Lower extremity edema   . Malnutrition of moderate degree ([place])   . Mass of right side of neck   . Microcytic anemia   . Polio   . Polio   . Radius fracture   . Severe protein-calorie malnutrition ([place])   . Swelling of joint, knee, right   . Type 2 diabetes mellitus, controlled ([place])   . Venous insufficiency     Past Surgical History:  Procedure Laterality Date  . ABDOMINAL HYSTERECTOMY    . OPEN REDUCTION INTERNAL FIXATION (ORIF) DISTAL RADIAL FRACTURE Left 02/29/2016   Procedure: LEFT DISTAL RADIUS REPAIR OF MALUNION WITH OPEN REDUCTION INTERNAL FIXATION (ORIF);  Surgeon: Iran Planas, MD;  Location: [facility];  Service: Orthopedics;  Laterality: Left;    Patient Care Team: Golden Circle, FNP as PCP - General (Family Medicine)  Social History   Social History  . Marital status: Widowed    Spouse name: N/A  . Number of children: 2  . Years of education: 12   Occupational History  . Not on file.   Social History Main Topics  . Smoking status: Former Smoker    Packs/day: 0.50    Types: Cigarettes    Quit date: 09/09/1968  . Smokeless tobacco: Never Used  . Alcohol use No  . Drug use: No  . Sexual activity: No   Other Topics Concern  . Not on file   Social History Narrative   Has family who helps her get around.     Fun: Read   Denies abuse and feel safe at home.     reports that she quit smoking about 48 years ago. Her smoking use included Cigarettes. She smoked 0.50 packs per day. She has never used smokeless  tobacco. She reports that she does not drink alcohol or use drugs.  Family History  Problem Relation Age of Onset  . Ovarian cancer Mother   . COPD Father   . Hypertension Unknown   . High Cholesterol Unknown   . Diabetes Mellitus II Neg Hx   . Lupus Neg Hx   . Sarcoidosis Neg Hx    Family Status  Relation Status  . Mother Deceased  . Father Deceased  . Unknown (Not Specified)  . Neg Hx (Not Specified)    Immunization History  Administered Date(s) Administered  . PPD Test 04/03/2017    Allergies  Allergen Reactions  . Orange Concentrate [Flavoring Agent] Shortness Of Breath, Itching and Swelling  . Peach [Prunus Persica] Shortness Of Breath, Itching and Swelling  . Penicillins Anaphylaxis and Hives    All 'cillins' Has patient had a PCN reaction causing immediate rash, facial/tongue/throat swelling, SOB or lightheadedness with hypotension: Yes Has patient had a PCN reaction causing severe rash involving mucus membranes or skin necrosis: No Has patient had a PCN reaction that required hospitalization No Has patient had a PCN reaction occurring within the last 10 years: No If all of the above answers are "NO", then may proceed with Cephalosporin use.   . Strawberry Extract Shortness Of Breath, Itching and Swelling  . Adhesive [Tape] Itching and Rash    Please use "paper" tape  . Aspirin Itching  . Latex Itching and Rash  . Other Hives, Itching and Rash    NO "-CILLINS"  . Pineapple Itching and Rash  . Strawberry Flavor Itching and Rash    Medications: Patient's Medications  New Prescriptions   No medications on file  Previous Medications   AMINO ACIDS-PROTEIN HYDROLYS (FEEDING SUPPLEMENT, PRO-STAT SUGAR FREE 64,) LIQD    Take 30 mLs by mouth 3 (three) times daily with meals.   CLOBETASOL CREAM (TEMOVATE) 0.05 %    Apply 1 application topically 2 (two) times daily.   FUROSEMIDE (LASIX) 40 MG TABLET    TAKE 1 TABLET BY MOUTH EVERY DAY   SILVER SULFADIAZINE  (SILVADENE) 1 % CREAM    Apply 1 application topically daily. Apply to Right Leg Wound   TRAMADOL (ULTRAM) 50 MG TABLET    TAKE 1 TABLET EVERY 8 HOURS AS NEEDED FOR PAIN  Modified Medications   No medications on file  Discontinued Medications   No medications on file    Review of Systems  Unable to perform ROS: Other (cognitive impairment)    Vitals:   04/07/17 0938  BP: 125/70  Pulse: (!) 113  Resp:  18  Temp: (!) 97.3 F (36.3 C)  TempSrc: Oral  SpO2: 95%  Weight: 199 lb 8 oz (90.5 kg)  Height: '5\' 1"'$  (1.549 m)   Body mass index is 37.7 kg/m.  Physical Exam  Constitutional: She is oriented to person, place, and time. She appears well-developed.  Frail appearing in NAD, sitting on bed  HENT:  Head:    Mouth/Throat: Oropharynx is clear and moist. No oropharyngeal exudate.  MMM; no oral thrush  Eyes: Pupils are equal, round, and reactive to light. No scleral icterus.  Neck: Neck supple. Carotid bruit is not present. No tracheal deviation present. No thyromegaly present.  Cardiovascular: Normal rate, regular rhythm and intact distal pulses.  Exam reveals no gallop and no friction rub.   Murmur heard.  Systolic murmur is present with a grade of 1/6  +1 pitting LE edema b/l. No calf TTP. Chronic venous stasis changes  Pulmonary/Chest: Effort normal and breath sounds normal. No stridor. No respiratory distress. She has no wheezes. She has no rales.  Abdominal: Soft. Normal appearance and bowel sounds are normal. She exhibits no distension and no mass. There is no hepatomegaly. There is no tenderness. There is no rigidity, no rebound and no guarding. No hernia.  Lymphadenopathy:    She has no cervical adenopathy.  Neurological: She is alert and oriented to person, place, and time. She has normal reflexes.  Skin: Skin is warm and dry. No rash noted.  B/l 1st toenail elongated but intact; dystrophic appearing  Psychiatric: She has a normal mood and affect. Her behavior is  normal. Judgment and thought content normal.     Labs reviewed: Appointment on 03/24/2017  Component Date Value Ref Range Status  . Hgb A1c MFr Bld 03/24/2017 5.2  4.6 - 6.5 % Final   Glycemic Control Guidelines for People with Diabetes:Non Diabetic:  <6%Goal of Therapy: <7%Additional Action Suggested:  >8%   . Sodium 03/24/2017 138  135 - 145 mEq/L Final  . Potassium 03/24/2017 4.0  3.5 - 5.1 mEq/L Final  . Chloride 03/24/2017 102  96 - 112 mEq/L Final  . CO2 03/24/2017 29  19 - 32 mEq/L Final  . Glucose, Bld 03/24/2017 86  70 - 99 mg/dL Final  . BUN 03/24/2017 10  6 - 23 mg/dL Final  . Creatinine, Ser 03/24/2017 0.72  0.40 - 1.20 mg/dL Final  . Total Bilirubin 03/24/2017 0.7  0.2 - 1.2 mg/dL Final  . Alkaline Phosphatase 03/24/2017 77  39 - 117 U/L Final  . AST 03/24/2017 17  0 - 37 U/L Final  . ALT 03/24/2017 8  0 - 35 U/L Final  . Total Protein 03/24/2017 8.0  6.0 - 8.3 g/dL Final  . Albumin 03/24/2017 3.6  3.5 - 5.2 g/dL Final  . Calcium 03/24/2017 9.8  8.4 - 10.5 mg/dL Final  . GFR 03/24/2017 102.02  >60.00 mL/min Final  Admission on 03/01/2017, Discharged on 03/02/2017  Component Date Value Ref Range Status  . WBC 03/01/2017 5.4  4.0 - 10.5 K/uL Final  . RBC 03/01/2017 4.74  3.87 - 5.11 MIL/uL Final  . Hemoglobin 03/01/2017 10.5* 12.0 - 15.0 g/dL Final  . HCT 03/01/2017 34.8* 36.0 - 46.0 % Final  . MCV 03/01/2017 73.4* 78.0 - 100.0 fL Final  . MCH 03/01/2017 22.2* 26.0 - 34.0 pg Final  . MCHC 03/01/2017 30.2  30.0 - 36.0 g/dL Final  . RDW 03/01/2017 14.7  11.5 - 15.5 % Final  . Platelets 03/01/2017 189  150 -  400 K/uL Final  . Neutrophils Relative % 03/01/2017 54  % Final  . Lymphocytes Relative 03/01/2017 37  % Final  . Monocytes Relative 03/01/2017 5  % Final  . Eosinophils Relative 03/01/2017 4  % Final  . Basophils Relative 03/01/2017 0  % Final  . Neutro Abs 03/01/2017 2.9  1.7 - 7.7 K/uL Final  . Lymphs Abs 03/01/2017 2.0  0.7 - 4.0 K/uL Final  . Monocytes  Absolute 03/01/2017 0.3  0.1 - 1.0 K/uL Final  . Eosinophils Absolute 03/01/2017 0.2  0.0 - 0.7 K/uL Final  . Basophils Absolute 03/01/2017 0.0  0.0 - 0.1 K/uL Final  . RBC Morphology 03/01/2017 ELLIPTOCYTES   Final  . Color, Urine 03/01/2017 YELLOW  YELLOW Final  . APPearance 03/01/2017 CLEAR  CLEAR Final  . Specific Gravity, Urine 03/01/2017 1.023  1.005 - 1.030 Final  . pH 03/01/2017 6.0  5.0 - 8.0 Final  . Glucose, UA 03/01/2017 NEGATIVE  NEGATIVE mg/dL Final  . Hgb urine dipstick 03/01/2017 NEGATIVE  NEGATIVE Final  . Bilirubin Urine 03/01/2017 NEGATIVE  NEGATIVE Final  . Ketones, ur 03/01/2017 NEGATIVE  NEGATIVE mg/dL Final  . Protein, ur 03/01/2017 30* NEGATIVE mg/dL Final  . Nitrite 03/01/2017 NEGATIVE  NEGATIVE Final  . Leukocytes, UA 03/01/2017 NEGATIVE  NEGATIVE Final  . RBC / HPF 03/01/2017 0-5  0 - 5 RBC/hpf Final  . WBC, UA 03/01/2017 0-5  0 - 5 WBC/hpf Final  . Bacteria, UA 03/01/2017 NONE SEEN  NONE SEEN Final  . Squamous Epithelial / LPF 03/01/2017 0-5* NONE SEEN Final  . Mucous 03/01/2017 PRESENT   Final  . Sodium 03/01/2017 137  135 - 145 mmol/L Final  . Potassium 03/01/2017 3.4* 3.5 - 5.1 mmol/L Final  . Chloride 03/01/2017 103  101 - 111 mmol/L Final  . CO2 03/01/2017 25  22 - 32 mmol/L Final  . Glucose, Bld 03/01/2017 93  65 - 99 mg/dL Final  . BUN 03/01/2017 13  6 - 20 mg/dL Final  . Creatinine, Ser 03/01/2017 0.69  0.44 - 1.00 mg/dL Final  . Calcium 03/01/2017 9.3  8.9 - 10.3 mg/dL Final  . Total Protein 03/01/2017 7.5  6.5 - 8.1 g/dL Final  . Albumin 03/01/2017 3.4* 3.5 - 5.0 g/dL Final  . AST 03/01/2017 16  15 - 41 U/L Final  . ALT 03/01/2017 9* 14 - 54 U/L Final  . Alkaline Phosphatase 03/01/2017 71  38 - 126 U/L Final  . Total Bilirubin 03/01/2017 0.6  0.3 - 1.2 mg/dL Final  . GFR calc non Af Amer 03/01/2017 >60  >60 mL/min Final  . GFR calc Af Amer 03/01/2017 >60  >60 mL/min Final   Comment: (NOTE) The eGFR has been calculated using the CKD EPI  equation. This calculation has not been validated in all clinical situations. eGFR's persistently <60 mL/min signify possible Chronic Kidney Disease.   . Anion gap 03/01/2017 9  5 - 15 Final  . Specimen Description 03/01/2017 URINE, CLEAN CATCH   Final  . Special Requests 03/01/2017 NONE   Final  . Culture 03/01/2017 NO GROWTH   Final  . Report Status 03/01/2017 03/03/2017 FINAL   Final  Lab on 02/07/2017  Component Date Value Ref Range Status  . Sodium 02/07/2017 140  135 - 145 mEq/L Final  . Potassium 02/07/2017 3.5  3.5 - 5.1 mEq/L Final  . Chloride 02/07/2017 105  96 - 112 mEq/L Final  . CO2 02/07/2017 29  19 - 32 mEq/L Final  .  Glucose, Bld 02/07/2017 78  70 - 99 mg/dL Final  . BUN 02/07/2017 12  6 - 23 mg/dL Final  . Creatinine, Ser 02/07/2017 0.77  0.40 - 1.20 mg/dL Final  . Calcium 02/07/2017 9.3  8.4 - 10.5 mg/dL Final  . GFR 02/07/2017 94.45  >60.00 mL/min Final  . WBC 02/07/2017 5.1  4.0 - 10.5 K/uL Final  . RBC 02/07/2017 4.57  3.87 - 5.11 Mil/uL Final  . Platelets 02/07/2017 258.0  150.0 - 400.0 K/uL Final  . Hemoglobin 02/07/2017 10.4* 12.0 - 15.0 g/dL Final  . HCT 02/07/2017 33.2* 36.0 - 46.0 % Final  . MCV 02/07/2017 72.5* 78.0 - 100.0 fl Final  . MCHC 02/07/2017 31.4  30.0 - 36.0 g/dL Final  . RDW 02/07/2017 15.9* 11.5 - 15.5 % Final    No results found.   Assessment/Plan   ICD-10-CM   1. Physical deconditioning R53.81   2. Impaired cognition R41.89   3. Bilateral lower extremity edema R60.0   4. Failure to thrive in adult R62.7   5. Anemia, unspecified type D64.9   6. Eczema, unspecified type L30.9   7. Elevated sed rate R70.0   8. Hyperglycemia R73.9    with hx DM  9. Chronic venous stasis dermatitis I87.2   10. Neck mass R22.1    more prominent at right angle of the mandible; probable lipoma per records    Cont current meds as ordered  Wound care as ordered  F/u with specialists as scheduled  T/c ENT eval of right lipoma if it  enlarges  PT/OT/ST as ordered  Nutritional supplements as indicated  GOAL: short term rehab and d/c home when medically appropriate. Communicated with pt and nursing.  Will follow   Mahagony Grieb S. Perlie Gold  [facility] [address] [place], [place] [address] [phone] Cell (Monday-Friday 8 AM - 5 PM) [phone] After 5 PM and follow prompts

## 2017-04-09 ENCOUNTER — Encounter (HOSPITAL_COMMUNITY): Payer: Medicare Other

## 2017-04-09 ENCOUNTER — Encounter: Payer: Medicare Other | Admitting: Vascular Surgery

## 2017-04-18 ENCOUNTER — Ambulatory Visit: Payer: Medicare Other | Admitting: Family

## 2017-04-30 ENCOUNTER — Ambulatory Visit: Payer: Medicare Other | Admitting: Podiatry

## 2017-05-04 ENCOUNTER — Encounter (HOSPITAL_COMMUNITY): Payer: Self-pay | Admitting: Nurse Practitioner

## 2017-05-04 ENCOUNTER — Emergency Department (HOSPITAL_COMMUNITY): Payer: Medicare Other

## 2017-05-04 ENCOUNTER — Emergency Department (HOSPITAL_COMMUNITY)
Admission: EM | Admit: 2017-05-04 | Discharge: 2017-05-04 | Disposition: A | Discharge status: Home / Self Care | Payer: Medicare Other | Attending: Emergency Medicine | Admitting: Emergency Medicine

## 2017-05-04 DIAGNOSIS — E119 Type 2 diabetes mellitus without complications: Secondary | ICD-10-CM | POA: Diagnosis not present

## 2017-05-04 DIAGNOSIS — L97929 Non-pressure chronic ulcer of unspecified part of left lower leg with unspecified severity: Secondary | ICD-10-CM | POA: Insufficient documentation

## 2017-05-04 DIAGNOSIS — I872 Venous insufficiency (chronic) (peripheral): Secondary | ICD-10-CM | POA: Diagnosis present

## 2017-05-04 DIAGNOSIS — I10 Essential (primary) hypertension: Secondary | ICD-10-CM | POA: Insufficient documentation

## 2017-05-04 DIAGNOSIS — L97919 Non-pressure chronic ulcer of unspecified part of right lower leg with unspecified severity: Secondary | ICD-10-CM | POA: Diagnosis not present

## 2017-05-04 DIAGNOSIS — I878 Other specified disorders of veins: Secondary | ICD-10-CM

## 2017-05-04 DIAGNOSIS — Z91040 Latex allergy status: Secondary | ICD-10-CM | POA: Diagnosis not present

## 2017-05-04 DIAGNOSIS — Z87898 Personal history of other specified conditions: Secondary | ICD-10-CM | POA: Insufficient documentation

## 2017-05-04 LAB — CBC WITH DIFFERENTIAL/PLATELET
BASOS ABS: 0 10*3/uL (ref 0.0–0.1)
BASOS PCT: 0 %
EOS PCT: 3 %
Eosinophils Absolute: 0.2 10*3/uL (ref 0.0–0.7)
HCT: 35.7 % — ABNORMAL LOW (ref 36.0–46.0)
Hemoglobin: 11 g/dL — ABNORMAL LOW (ref 12.0–15.0)
LYMPHS PCT: 35 %
Lymphs Abs: 2 10*3/uL (ref 0.7–4.0)
MCH: 22.9 pg — ABNORMAL LOW (ref 26.0–34.0)
MCHC: 30.8 g/dL (ref 30.0–36.0)
MCV: 74.2 fL — AB (ref 78.0–100.0)
Monocytes Absolute: 0.3 10*3/uL (ref 0.1–1.0)
Monocytes Relative: 6 %
Neutro Abs: 3.2 10*3/uL (ref 1.7–7.7)
Neutrophils Relative %: 57 %
PLATELETS: 220 10*3/uL (ref 150–400)
RBC: 4.81 MIL/uL (ref 3.87–5.11)
RDW: 16.5 % — ABNORMAL HIGH (ref 11.5–15.5)
WBC: 5.6 10*3/uL (ref 4.0–10.5)

## 2017-05-04 LAB — COMPREHENSIVE METABOLIC PANEL
ALT: 10 U/L — ABNORMAL LOW (ref 14–54)
ANION GAP: 6 (ref 5–15)
AST: 14 U/L — AB (ref 15–41)
Albumin: 3.8 g/dL (ref 3.5–5.0)
Alkaline Phosphatase: 83 U/L (ref 38–126)
BILIRUBIN TOTAL: 0.3 mg/dL (ref 0.3–1.2)
BUN: 17 mg/dL (ref 6–20)
CO2: 29 mmol/L (ref 22–32)
Calcium: 9.2 mg/dL (ref 8.9–10.3)
Chloride: 108 mmol/L (ref 101–111)
Creatinine, Ser: 0.75 mg/dL (ref 0.44–1.00)
GFR calc Af Amer: >60 mL/min (ref >60)
Glucose, Bld: 81 mg/dL (ref 65–99)
POTASSIUM: 3.9 mmol/L (ref 3.5–5.1)
Sodium: 143 mmol/L (ref 135–145)
TOTAL PROTEIN: 8.3 g/dL — AB (ref 6.5–8.1)

## 2017-05-04 LAB — I-STAT CG4 LACTIC ACID, ED: LACTIC ACID, VENOUS: 0.55 mmol/L (ref 0.5–1.9)

## 2017-05-04 MED ORDER — HYDROCODONE-ACETAMINOPHEN 5-325 MG PO TABS: 1.0000 | ORAL_TABLET | Freq: Four times a day (QID) | ORAL | 0 refills | Status: DC | PRN

## 2017-05-04 MED ORDER — ACETAMINOPHEN 325 MG PO TABS
650.0000 mg | ORAL_TABLET | Freq: Once | ORAL | Status: AC
  Administered 2017-05-04: 650 mg via ORAL
  Filled 2017-05-04: qty 2

## 2017-05-04 MED ORDER — HYDROCODONE-ACETAMINOPHEN 5-325 MG PO TABS
1.0000 | ORAL_TABLET | Freq: Once | ORAL | Status: AC
  Administered 2017-05-04: 1 via ORAL
  Filled 2017-05-04: qty 1

## 2017-05-04 MED ORDER — CLINDAMYCIN HCL 150 MG PO CAPS: 450.0000 mg | ORAL_CAPSULE | Freq: Four times a day (QID) | ORAL | 0 refills | Status: AC

## 2017-05-04 MED ORDER — CLINDAMYCIN PHOSPHATE 600 MG/50ML IV SOLN
600.0000 mg | Freq: Once | INTRAVENOUS | Status: AC
Start: 2017-05-04 — End: 2017-05-04
  Administered 2017-05-04: 600 mg via INTRAVENOUS
  Filled 2017-05-04: qty 50

## 2017-05-04 NOTE — ED Provider Notes (Signed)
[facility] DEPT Provider Note   CSN: [id number] Arrival date & time: 05/04/17  1545     History   Chief Complaint Chief Complaint  Patient presents with  . Leg Wounds    HPI [name] is a 71 y.o. female w PMHx HTN, venous insufficiency, T2DM, chronic anemia, presenting to the ED with right leg drainage. Pt with chronic venous insufficiency, reporting that Was seen by the wound clinic about 2 weeks ago who wrapped both of her legs in gauze and [place] from foot to just below the knee. She states she has been wearing those wraps since it replaced, as well as in the shower. She states yesterday her right leg began draining through the wraps so she removed the top half of it and noticed a large blister in her leg. She reports to the ER today for concern of the drainage present. She states she has minimal associated pain. She reports she has a wound care appointment either tomorrow or Tuesday for follow-up. She denies fever/chills, nausea/vomiting, or other complaints today.  The history is provided by the patient.    Past Medical History:  Diagnosis Date  . Acute pericarditis, unspecified   . Arthritis   . Arthritis   . Bilateral swelling of feet   . Cellulitis   . Chronic anemia   . Chronic knee pain    right  . Closed fracture of styloid process of ulna   . Cognitive decline   . Decreased appetite   . Diabetes mellitus    A1C has been normal for past 2 years and takes no diabetic meds at present  . Distal radius fracture 02/04/2016  . Distal radius fracture, left   . Distal radius fracture, left   . ECG abnormal   . Fall   . Fever   . Hypertension   . Hypertension   . Hypoglycemia   . Knee pain, chronic   . Left scapholunate ligament tear   . Lower extremity edema   . Malnutrition of moderate degree ([place])   . Mass of right side of neck   . Microcytic anemia   . Polio   . Polio   . Radius fracture   . Severe protein-calorie malnutrition ([place])   . Swelling  of joint, knee, right   . Type 2 diabetes mellitus, controlled ([place])   . Venous insufficiency     Patient Active Problem List   Diagnosis Date Noted  . Physical deconditioning 04/04/2017  . Eczema 03/24/2017  . Decreased independence with activities of daily living 03/24/2017  . Toenail deformity 03/24/2017  . Decreased mobility and endurance 02/07/2017  . Cellulitis   . [place] scabies 12/18/2016  . Community acquired pneumonia of right lower lobe of lung ([place])   . Dehydration   . Failure to thrive in adult   . Impetigo   . Rash of entire body 12/06/2016  . Localized swelling, mass, or lump of lower extremity, bilateral 12/06/2016  . Constipation 03/24/2016  . Fall 02/04/2016  . Chronic anemia 02/04/2016  . Cognitive decline 02/04/2016  . Cellulitis of right arm 01/19/2016  . Swelling of joint, knee, right 10/19/2015  . Venous insufficiency of both lower extremities, chronic 10/19/2015  . Bilateral lower extremity edema 05/24/2015  . Type 2 diabetes mellitus, controlled ([place]) 05/24/2015  . Malnutrition of moderate degree ([place]) 06/07/2014  . Acute pericarditis, unspecified 06/07/2014  . Hypoglycemia 06/06/2014  . Fever 06/06/2014  . Knee pain, chronic 06/06/2014  . Mass of right  side of neck 06/06/2014  . Severe protein-calorie malnutrition ([place]) 06/06/2014  . Microcytic anemia 06/06/2014    Past Surgical History:  Procedure Laterality Date  . ABDOMINAL HYSTERECTOMY    . OPEN REDUCTION INTERNAL FIXATION (ORIF) DISTAL RADIAL FRACTURE Left 02/29/2016   Procedure: LEFT DISTAL RADIUS REPAIR OF MALUNION WITH OPEN REDUCTION INTERNAL FIXATION (ORIF);  Surgeon: Iran Planas, MD;  Location: [facility];  Service: Orthopedics;  Laterality: Left;    OB History    No data available       Home Medications    Prior to Admission medications   Medication Sig Start Date End Date Taking? Authorizing Provider  furosemide (LASIX) 40 MG tablet TAKE 1 TABLET BY MOUTH EVERY DAY 04/01/17   Yes Golden Circle, FNP  traMADol (ULTRAM) 50 MG tablet TAKE 1 TABLET EVERY 8 HOURS AS NEEDED FOR PAIN 03/27/17  Yes Golden Circle, FNP  clindamycin (CLEOCIN) 150 MG capsule Take 3 capsules (450 mg total) by mouth 4 (four) times daily. 05/04/17 05/11/17  Russo, [place] N, PA-C  clobetasol cream (TEMOVATE) 6.29 % Apply 1 application topically 2 (two) times daily. Patient not taking: Reported on 05/04/2017 03/24/17   Golden Circle, FNP  HYDROcodone-acetaminophen (NORCO/VICODIN) 5-325 MG tablet Take 1-2 tablets by mouth every 6 (six) hours as needed for severe pain. 05/04/17   Russo, [place] N, PA-C    Family History Family History  Problem Relation Age of Onset  . Ovarian cancer Mother   . COPD Father   . Hypertension Unknown   . High Cholesterol Unknown   . Diabetes Mellitus II Neg Hx   . Lupus Neg Hx   . Sarcoidosis Neg Hx     Social History Social History  Substance Use Topics  . Smoking status: Former Smoker    Packs/day: 0.50    Types: Cigarettes    Quit date: 09/09/1968  . Smokeless tobacco: Never Used  . Alcohol use No     Allergies   Orange concentrate [flavoring agent]; Peach [prunus persica]; Penicillins; Strawberry extract; Adhesive [tape]; Aspirin; Latex; Other; Pineapple; and Strawberry flavor   Review of Systems Review of Systems  Constitutional: Negative for chills and fever.  Gastrointestinal: Negative for nausea and vomiting.  Musculoskeletal: Positive for myalgias.  Skin: Positive for color change and wound.  Allergic/Immunologic: Positive for immunocompromised state (T2DM).  All other systems reviewed and are negative.    Physical Exam Updated Vital Signs BP 138/76   Pulse 76   Temp 97.7 F (36.5 C) (Oral)   Resp 18   SpO2 98%   Physical Exam  Constitutional: She appears well-developed and well-nourished. No distress.  HENT:  Head: Normocephalic and atraumatic.  Eyes: Pupils are equal, round, and reactive to light. Conjunctivae and EOM are  normal.  Neck:  Mass on right neck (chronic)  Cardiovascular: Normal rate, regular rhythm and intact distal pulses.   Pulmonary/Chest: Effort normal and breath sounds normal. No respiratory distress. She has no wheezes. She has no rales.  Abdominal: Soft. Bowel sounds are normal. She exhibits no distension. There is no tenderness. There is no guarding.  Neurological: She is alert.  Skin: Skin is warm.  Right lower leg with thickened skin consistent with venous stasis, and clear fluid-filled blisters. Skin is erythematous and slightly tender. No purulent drainage.  Psychiatric: She has a normal mood and affect. Her behavior is normal.  Nursing note and vitals reviewed.        ED Treatments / Results  Labs (all labs  ordered are listed, but only abnormal results are displayed) Labs Reviewed  COMPREHENSIVE METABOLIC PANEL - Abnormal; Notable for the following:       Result Value   Total Protein 8.3 (*)    AST 14 (*)    ALT 10 (*)    All other components within normal limits  CBC WITH DIFFERENTIAL/PLATELET - Abnormal; Notable for the following:    Hemoglobin 11.0 (*)    HCT 35.7 (*)    MCV 74.2 (*)    MCH 22.9 (*)    RDW 16.5 (*)    All other components within normal limits  I-STAT CG4 LACTIC ACID, ED    EKG  EKG Interpretation None       Radiology Dg Tibia/fibula Right  Result Date: 05/04/2017 CLINICAL DATA:  Pain. EXAM: RIGHT TIBIA AND FIBULA - 2 VIEW COMPARISON:  None. FINDINGS: Severe degenerative changes are seen within the knee. Multiple skin lesions identified. No bony erosion identified. The well corticated calcification adjacent to the medial malleolus has a chronic appearance. No acute abnormality. IMPRESSION: No bony abnormality.  No evidence of osteomyelitis.  Skin lesions. Electronically Signed   By: Dorise Bullion III M.D   On: 05/04/2017 18:16    Procedures Procedures (including critical care time)  Medications Ordered in ED Medications    acetaminophen (TYLENOL) tablet 650 mg (650 mg Oral Given 05/04/17 1826)  clindamycin (CLEOCIN) IVPB 600 mg (0 mg Intravenous Stopped 05/04/17 2000)  HYDROcodone-acetaminophen (NORCO/VICODIN) 5-325 MG per tablet 1 tablet (1 tablet Oral Given 05/04/17 2018)     Initial Impression / Assessment and Plan / ED Course  I have reviewed the triage vital signs and the nursing notes.  Pertinent labs & imaging results that were available during my care of the patient were reviewed by me and considered in my medical decision making (see chart for details).     Patient with chronic venous stasis dermatitis, presenting with weeping right leg. Fluid is clear, nonpurulent. Erythema noted to skin. IV clindamycin administered in ED to cover for possible cellulitis. She is afebrile, hemodynamically stable. White count normal. Lactic normal. CMP unremarkable. Patient followed by wound care, with appointment this week. Right leg dressed and wrapped. Rx for clindamycin and pain medication given. Strict return precautions discussed. Discussed proper wound care and importance of not showering with bandages in place, and importance of drying wounds and skin prior to applying dressing/wraps. Patient is safe for discharge.  Patient discussed with and seen by Dr. Sherry Ruffing, who agrees with care plan.  [place] Controlled Substance reporting System queried  Discussed results, findings, treatment and follow up. Patient advised of return precautions. Patient verbalized understanding and agreed with plan.   Final Clinical Impressions(s) / ED Diagnoses   Final diagnoses:  Venous stasis of both lower extremities    New Prescriptions Discharge Medication List as of 05/04/2017  8:02 PM    START taking these medications   Details  clindamycin (CLEOCIN) 150 MG capsule Take 3 capsules (450 mg total) by mouth 4 (four) times daily., Starting Sun 05/04/2017, Until Sun 05/11/2017, Print    HYDROcodone-acetaminophen  (NORCO/VICODIN) 5-325 MG tablet Take 1-2 tablets by mouth every 6 (six) hours as needed for severe pain., Starting Sun 05/04/2017, Print         Russo, [place] N, PA-C 05/04/17 2228    Russo, [place] N, PA-C 05/04/17 2228    Tegeler, Gwenyth Allegra, MD 05/05/17 1025

## 2017-05-04 NOTE — Discharge Instructions (Signed)
Schedule an appointment with your Wound Care doctor as soon as possible. Begin taking the antibiotic, clindamycin, 3 tablets, 4 times per day until it is gone. You can take Norco every 6 hours as needed for severe pain. Take Tylenol, drink alcohol, or drive while taking this medication. Keep your right leg covered and dry. Do not shower with bandages in place. After your shower, pat your legs completely dry before apply a bandage. Elevate your legs when possible. Return to the ER for fever, or new or concerning symptoms.

## 2017-05-04 NOTE — ED Triage Notes (Signed)
Pt has scant-moderate weeping PVD related wounds bilaterally lower extremities, worse on the right leg. C/o moderate pain, more concerned with the blistering and the drainage.

## 2017-05-05 ENCOUNTER — Ambulatory Visit: Payer: Medicare Other | Admitting: Family

## 2017-05-07 ENCOUNTER — Encounter: Payer: Self-pay | Admitting: Vascular Surgery

## 2017-05-15 ENCOUNTER — Ambulatory Visit (HOSPITAL_COMMUNITY)
Admission: RE | Admit: 2017-05-15 | Discharge: 2017-05-15 | Disposition: A | Discharge status: Home / Self Care | Payer: Medicare Other | Source: Ambulatory Visit | Attending: Vascular Surgery | Admitting: Vascular Surgery

## 2017-05-15 DIAGNOSIS — R609 Edema, unspecified: Secondary | ICD-10-CM | POA: Diagnosis present

## 2017-05-15 DIAGNOSIS — I872 Venous insufficiency (chronic) (peripheral): Secondary | ICD-10-CM | POA: Diagnosis not present

## 2017-05-16 ENCOUNTER — Ambulatory Visit (INDEPENDENT_AMBULATORY_CARE_PROVIDER_SITE_OTHER): Payer: Medicare Other | Admitting: Vascular Surgery

## 2017-05-16 ENCOUNTER — Encounter: Payer: Self-pay | Admitting: Vascular Surgery

## 2017-05-16 VITALS — BP 161/96 | HR 82 | Temp 98.1°F | Resp 16 | Ht 67.0 in | Wt 199.0 lb

## 2017-05-16 DIAGNOSIS — Z0279 Encounter for issue of other medical certificate: Secondary | ICD-10-CM

## 2017-05-16 DIAGNOSIS — I872 Venous insufficiency (chronic) (peripheral): Secondary | ICD-10-CM | POA: Diagnosis not present

## 2017-05-16 DIAGNOSIS — S81809A Unspecified open wound, unspecified lower leg, initial encounter: Secondary | ICD-10-CM | POA: Diagnosis not present

## 2017-05-16 NOTE — Progress Notes (Signed)
Patient ID: [name], female   DOB: July 08, 1946, 71 y.o.   MRN: [id number]  Reason for Consult: New Patient (Initial Visit) (Epic referral, Mauricio Po, NP.  Chronic venous insuffiency, bilat LE swelling.)   Referred by Golden Circle, FNP  Subjective:     HPI:  [name] is a 71 y.o. female with a history of DVT who is sent here for evaluation of bilateral lower extremity limb swelling. She states that she has had progressively worsened swelling despite Lasix. She is able to walk but is limited with her legs and does use a walker to help. She's never had a wound on the left side but does have some wounds on the anterior surface with skin sloughing. She has not been in the wound care center. She has not worn compression in the past. She does not have any history of stroke coronary disease and does not have claudication-type symptoms.  Past Medical History:  Diagnosis Date  . Acute pericarditis, unspecified   . Arthritis   . Arthritis   . Bilateral swelling of feet   . Cellulitis   . Chronic anemia   . Chronic knee pain    right  . Closed fracture of styloid process of ulna   . Cognitive decline   . Decreased appetite   . Diabetes mellitus    A1C has been normal for past 2 years and takes no diabetic meds at present  . Distal radius fracture 02/04/2016  . Distal radius fracture, left   . Distal radius fracture, left   . ECG abnormal   . Fall   . Fever   . Hypertension   . Hypertension   . Hypoglycemia   . Knee pain, chronic   . Left scapholunate ligament tear   . Lower extremity edema   . Malnutrition of moderate degree ([place])   . Mass of right side of neck   . Microcytic anemia   . Polio   . Polio   . Radius fracture   . Severe protein-calorie malnutrition ([place])   . Swelling of joint, knee, right   . Type 2 diabetes mellitus, controlled ([place])   . Venous insufficiency    Family History  Problem Relation Age of Onset  . Ovarian cancer Mother     . COPD Father   . Hypertension Unknown   . High Cholesterol Unknown   . Diabetes Mellitus II Neg Hx   . Lupus Neg Hx   . Sarcoidosis Neg Hx    Past Surgical History:  Procedure Laterality Date  . ABDOMINAL HYSTERECTOMY    . OPEN REDUCTION INTERNAL FIXATION (ORIF) DISTAL RADIAL FRACTURE Left 02/29/2016   Procedure: LEFT DISTAL RADIUS REPAIR OF MALUNION WITH OPEN REDUCTION INTERNAL FIXATION (ORIF);  Surgeon: Iran Planas, MD;  Location: [facility];  Service: Orthopedics;  Laterality: Left;    Short Social History:  Social History  Substance Use Topics  . Smoking status: Former Smoker    Packs/day: 0.50    Types: Cigarettes    Quit date: 09/09/1968  . Smokeless tobacco: Never Used  . Alcohol use No    Allergies  Allergen Reactions  . Orange Concentrate [Flavoring Agent] Shortness Of Breath, Itching and Swelling  . Peach [Prunus Persica] Shortness Of Breath, Itching and Swelling  . Penicillins Anaphylaxis and Hives    All 'cillins' Has patient had a PCN reaction causing immediate rash, facial/tongue/throat swelling, SOB or lightheadedness with hypotension: Yes Has patient had a PCN reaction causing severe  rash involving mucus membranes or skin necrosis: No Has patient had a PCN reaction that required hospitalization No Has patient had a PCN reaction occurring within the last 10 years: No If all of the above answers are "NO", then may proceed with Cephalosporin use.   . Strawberry Extract Shortness Of Breath, Itching and Swelling  . Adhesive [Tape] Itching and Rash    Please use "paper" tape  . Aspirin Itching  . Latex Itching and Rash  . Other Hives, Itching and Rash    NO "-CILLINS"  . Pineapple Itching and Rash  . Strawberry Flavor Itching and Rash    Current Outpatient Prescriptions  Medication Sig Dispense Refill  . furosemide (LASIX) 40 MG tablet TAKE 1 TABLET BY MOUTH EVERY DAY 30 tablet 3  . HYDROcodone-acetaminophen (NORCO/VICODIN) 5-325 MG tablet Take 1-2 tablets by  mouth every 6 (six) hours as needed for severe pain. 10 tablet 0  . traMADol (ULTRAM) 50 MG tablet TAKE 1 TABLET EVERY 8 HOURS AS NEEDED FOR PAIN 30 tablet 0   No current facility-administered medications for this visit.     Review of Systems  Constitutional:  Constitutional negative. HENT: HENT negative.  Eyes: Eyes negative.  Respiratory: Respiratory negative.  Cardiovascular: Positive for leg swelling.  GI: Gastrointestinal negative.  Musculoskeletal: Musculoskeletal negative.  Skin: Positive for wound.  Neurological: Neurological negative. Hematologic: Hematologic/lymphatic negative.  Psychiatric: Psychiatric negative.        Objective:  Objective   Vitals:   05/16/17 1105  BP: (!) 161/96  Pulse: 82  Resp: 16  Temp: 98.1 F (36.7 C)  TempSrc: Oral  SpO2: 100%  Weight: 199 lb (90.3 kg)  Height: 5\' 7"  (1.702 m)   Body mass index is 31.17 kg/m.  Physical Exam  Constitutional: She is oriented to person, place, and time. She appears well-developed.  HENT:  Head: Normocephalic.  Eyes: Pupils are equal, round, and reactive to light.  Neck: Normal range of motion. Neck supple.  Cardiovascular: Normal rate.   Pulmonary/Chest: Effort normal.  Abdominal: Soft. She exhibits no mass.  Musculoskeletal: Normal range of motion. She exhibits edema.  Lymphadenopathy:    She has no cervical adenopathy.  Neurological: She is alert and oriented to person, place, and time.  Skin:  Dry skin on left leg Right leg with skin slough anteriorly in 2 areas up to 6cm  Psychiatric: She has a normal mood and affect. Her behavior is normal. Judgment and thought content normal.    Data:      Assessment/Plan:     71 year old female here with bilateral lower extremity swelling. She does not have any venous insufficiency to merit intervention at this time. She does have urgent need for compression and local wound care. At the bedside today I did debride some dead skin of the right leg  placed a nonstick gauze and also wrapped her legs in Ace bandages as we do not have compression stockings here. I given her information regarding 20-39 m of mercury knee-high compression to be obtained urgently we will also refer her to both podiatry for her toenails as well as wound care for her legs. She does appear to have arterial supply with palpable pulses to heal any wounds on the feet or to undergo any interventions. She can follow-up when necessary when necessary basis.     Waynetta Sandy MD [facility]

## 2017-05-16 NOTE — Addendum Note (Signed)
Addended by: Lianne Cure A on: 05/16/2017 01:14 PM   Modules accepted: Orders

## 2017-05-30 ENCOUNTER — Ambulatory Visit: Payer: Medicare Other | Admitting: Podiatry

## 2017-06-03 ENCOUNTER — Encounter (HOSPITAL_BASED_OUTPATIENT_CLINIC_OR_DEPARTMENT_OTHER): Payer: Medicare Other | Attending: Surgery

## 2017-06-03 DIAGNOSIS — E11649 Type 2 diabetes mellitus with hypoglycemia without coma: Secondary | ICD-10-CM | POA: Insufficient documentation

## 2017-06-03 DIAGNOSIS — I10 Essential (primary) hypertension: Secondary | ICD-10-CM | POA: Diagnosis not present

## 2017-06-03 DIAGNOSIS — I890 Lymphedema, not elsewhere classified: Secondary | ICD-10-CM | POA: Diagnosis not present

## 2017-06-03 DIAGNOSIS — Z87891 Personal history of nicotine dependence: Secondary | ICD-10-CM | POA: Insufficient documentation

## 2017-06-03 DIAGNOSIS — Z79891 Long term (current) use of opiate analgesic: Secondary | ICD-10-CM | POA: Insufficient documentation

## 2017-06-03 DIAGNOSIS — Z79899 Other long term (current) drug therapy: Secondary | ICD-10-CM | POA: Diagnosis not present

## 2017-06-03 DIAGNOSIS — R460 Very low level of personal hygiene: Secondary | ICD-10-CM | POA: Insufficient documentation

## 2017-06-03 DIAGNOSIS — I2510 Atherosclerotic heart disease of native coronary artery without angina pectoris: Secondary | ICD-10-CM | POA: Insufficient documentation

## 2017-06-10 ENCOUNTER — Ambulatory Visit: Payer: Self-pay | Admitting: Podiatry

## 2017-06-18 ENCOUNTER — Encounter (HOSPITAL_BASED_OUTPATIENT_CLINIC_OR_DEPARTMENT_OTHER): Payer: Medicare Other

## 2017-07-05 ENCOUNTER — Encounter (HOSPITAL_COMMUNITY): Payer: Self-pay | Admitting: Emergency Medicine

## 2017-07-05 ENCOUNTER — Ambulatory Visit (HOSPITAL_COMMUNITY)
Admission: EM | Admit: 2017-07-05 | Discharge: 2017-07-05 | Disposition: A | Discharge status: Home / Self Care | Payer: Medicare Other | Attending: Family Medicine | Admitting: Family Medicine

## 2017-07-05 DIAGNOSIS — L0100 Impetigo, unspecified: Secondary | ICD-10-CM | POA: Diagnosis not present

## 2017-07-05 DIAGNOSIS — I872 Venous insufficiency (chronic) (peripheral): Secondary | ICD-10-CM | POA: Insufficient documentation

## 2017-07-05 DIAGNOSIS — Z87891 Personal history of nicotine dependence: Secondary | ICD-10-CM | POA: Insufficient documentation

## 2017-07-05 DIAGNOSIS — I10 Essential (primary) hypertension: Secondary | ICD-10-CM | POA: Diagnosis not present

## 2017-07-05 DIAGNOSIS — G8929 Other chronic pain: Secondary | ICD-10-CM | POA: Insufficient documentation

## 2017-07-05 DIAGNOSIS — R221 Localized swelling, mass and lump, neck: Secondary | ICD-10-CM | POA: Diagnosis not present

## 2017-07-05 DIAGNOSIS — D509 Iron deficiency anemia, unspecified: Secondary | ICD-10-CM | POA: Diagnosis not present

## 2017-07-05 DIAGNOSIS — E119 Type 2 diabetes mellitus without complications: Secondary | ICD-10-CM | POA: Insufficient documentation

## 2017-07-05 DIAGNOSIS — M1990 Unspecified osteoarthritis, unspecified site: Secondary | ICD-10-CM | POA: Diagnosis not present

## 2017-07-05 DIAGNOSIS — M25561 Pain in right knee: Secondary | ICD-10-CM | POA: Insufficient documentation

## 2017-07-05 MED ORDER — CETIRIZINE HCL 10 MG PO TABS: 10.0000 mg | ORAL_TABLET | Freq: Every day | ORAL | 0 refills | Status: DC

## 2017-07-05 MED ORDER — MUPIROCIN 2 % EX OINT: 1.0000 "application " | TOPICAL_OINTMENT | Freq: Two times a day (BID) | CUTANEOUS | 0 refills | Status: DC

## 2017-07-05 MED ORDER — DOXYCYCLINE HYCLATE 100 MG PO CAPS: 100.0000 mg | ORAL_CAPSULE | Freq: Two times a day (BID) | ORAL | 0 refills | Status: AC

## 2017-07-05 NOTE — ED Provider Notes (Signed)
[facility]    CSN: [id number] Arrival date & time: 07/05/17  1336     History   Chief Complaint Chief Complaint  Patient presents with  . Allergic Reaction    HPI [name] is a 71 y.o. female.   71 year old female with history of arthritis, hypertension, DM, right side neck mass, venous insufficiency comes in for few day history of rash/itchiness of the neck. Patient states has started new body wash, but symptoms only at the neck. She states she is careful with food intake as she has many food allergies, and does not think food caused her symptoms. She denies recent new medications. She states she has been scratching due to the rash and noticed skin breakdown yesterday. Denies fever, chills, night sweats. Denies spreading erythema, increased warmth, pain. DM is well controlled without medications, last a1c 5.2      Past Medical History:  Diagnosis Date  . Acute pericarditis, unspecified   . Arthritis   . Arthritis   . Bilateral swelling of feet   . Cellulitis   . Chronic anemia   . Chronic knee pain    right  . Closed fracture of styloid process of ulna   . Cognitive decline   . Decreased appetite   . Diabetes mellitus    A1C has been normal for past 2 years and takes no diabetic meds at present  . Distal radius fracture 02/04/2016  . Distal radius fracture, left   . Distal radius fracture, left   . ECG abnormal   . Fall   . Fever   . Hypertension   . Hypertension   . Hypoglycemia   . Knee pain, chronic   . Left scapholunate ligament tear   . Lower extremity edema   . Malnutrition of moderate degree ([place])   . Mass of right side of neck   . Microcytic anemia   . Polio   . Polio   . Radius fracture   . Severe protein-calorie malnutrition ([place])   . Swelling of joint, knee, right   . Type 2 diabetes mellitus, controlled ([place])   . Venous insufficiency     Patient Active Problem List   Diagnosis Date Noted  . Physical deconditioning  04/04/2017  . Eczema 03/24/2017  . Decreased independence with activities of daily living 03/24/2017  . Toenail deformity 03/24/2017  . Decreased mobility and endurance 02/07/2017  . Cellulitis   . [place] scabies 12/18/2016  . Community acquired pneumonia of right lower lobe of lung ([place])   . Dehydration   . Failure to thrive in adult   . Impetigo   . Rash of entire body 12/06/2016  . Localized swelling, mass, or lump of lower extremity, bilateral 12/06/2016  . Constipation 03/24/2016  . Fall 02/04/2016  . Chronic anemia 02/04/2016  . Cognitive decline 02/04/2016  . Cellulitis of right arm 01/19/2016  . Swelling of joint, knee, right 10/19/2015  . Venous insufficiency of both lower extremities, chronic 10/19/2015  . Bilateral lower extremity edema 05/24/2015  . Type 2 diabetes mellitus, controlled ([place]) 05/24/2015  . Malnutrition of moderate degree ([place]) 06/07/2014  . Acute pericarditis, unspecified 06/07/2014  . Hypoglycemia 06/06/2014  . Fever 06/06/2014  . Knee pain, chronic 06/06/2014  . Mass of right side of neck 06/06/2014  . Severe protein-calorie malnutrition ([place]) 06/06/2014  . Microcytic anemia 06/06/2014    Past Surgical History:  Procedure Laterality Date  . ABDOMINAL HYSTERECTOMY    . OPEN REDUCTION INTERNAL  FIXATION (ORIF) DISTAL RADIAL FRACTURE Left 02/29/2016   Procedure: LEFT DISTAL RADIUS REPAIR OF MALUNION WITH OPEN REDUCTION INTERNAL FIXATION (ORIF);  Surgeon: Iran Planas, MD;  Location: [facility];  Service: Orthopedics;  Laterality: Left;    OB History    No data available       Home Medications    Prior to Admission medications   Medication Sig Start Date End Date Taking? Authorizing Provider  furosemide (LASIX) 40 MG tablet TAKE 1 TABLET BY MOUTH EVERY DAY 04/01/17  Yes Golden Circle, FNP  cetirizine (ZYRTEC) 10 MG tablet Take 1 tablet (10 mg total) by mouth daily. 07/05/17   Tasia Catchings, Amy V, PA-C  doxycycline (VIBRAMYCIN) 100 MG capsule Take 1  capsule (100 mg total) by mouth 2 (two) times daily. 07/05/17 07/12/17  Ok Edwards, PA-C  HYDROcodone-acetaminophen (NORCO/VICODIN) 5-325 MG tablet Take 1-2 tablets by mouth every 6 (six) hours as needed for severe pain. 05/04/17   Robinson, [place] N, PA-C  mupirocin ointment (BACTROBAN) 2 % Apply 1 application topically 2 (two) times daily. 07/05/17   Tasia Catchings, Amy V, PA-C  traMADol (ULTRAM) 50 MG tablet TAKE 1 TABLET EVERY 8 HOURS AS NEEDED FOR PAIN 03/27/17   Golden Circle, FNP    Family History Family History  Problem Relation Age of Onset  . Ovarian cancer Mother   . COPD Father   . Hypertension Unknown   . High Cholesterol Unknown   . Diabetes Mellitus II Neg Hx   . Lupus Neg Hx   . Sarcoidosis Neg Hx     Social History Social History  Substance Use Topics  . Smoking status: Former Smoker    Packs/day: 0.50    Types: Cigarettes    Quit date: 09/09/1968  . Smokeless tobacco: Never Used  . Alcohol use No     Allergies   Orange concentrate [flavoring agent]; Peach [prunus persica]; Penicillins; Strawberry extract; Adhesive [tape]; Aspirin; Latex; Other; Pineapple; and Strawberry flavor   Review of Systems Review of Systems  Reason unable to perform ROS: See HPI as above.     Physical Exam Triage Vital Signs ED Triage Vitals [07/05/17 1433]  Enc Vitals Group     BP (!) 160/100     Pulse Rate 65     Resp 20     Temp 98 F (36.7 C)     Temp Source Oral     SpO2 98 %     Weight      Height      Head Circumference      Peak Flow      Pain Score      Pain Loc      Pain Edu?      Excl. in [place]?    No data found.   Updated Vital Signs BP (!) 160/100 (BP Location: Left Arm)   Pulse 65   Temp 98 F (36.7 C) (Oral)   Resp 20   SpO2 98%   Physical Exam  Constitutional: She is oriented to person, place, and time. She appears well-developed and well-nourished. No distress.  HENT:  Head: Normocephalic and atraumatic.  Eyes: Pupils are equal, round, and reactive to  light. Conjunctivae are normal.  Cardiovascular: Normal rate, regular rhythm and normal heart sounds.  Exam reveals no gallop and no friction rub.   No murmur heard. Pulmonary/Chest: Effort normal and breath sounds normal. She has no wheezes. She has no rales.  Neurological: She is alert and oriented to person, place,  and time.  Skin:  See picture below. Skin breakdown on neck. Surrounding dry/cracked skin. Surrounding erythema with increased warmth.        UC Treatments / Results  Labs (all labs ordered are listed, but only abnormal results are displayed) Labs Reviewed  AEROBIC CULTURE (SUPERFICIAL SPECIMEN)    EKG  EKG Interpretation None       Radiology No results found.  Procedures Procedures (including critical care time)  Medications Ordered in UC Medications - No data to display   Initial Impression / Assessment and Plan / UC Course  I have reviewed the triage vital signs and the nursing notes.  Pertinent labs & imaging results that were available during my care of the patient were reviewed by me and considered in my medical decision making (see chart for details).    Given appearance, will treat as impetigo. Doxycycline with bactroban as directed. Zyrtec for itchiness. Patient to follow up with PCP as scheduled for recheck. Return precautions given.  Discussed case with Dr Joseph Art, who agrees to plan.   Final Clinical Impressions(s) / UC Diagnoses   Final diagnoses:  Impetigo    New Prescriptions New Prescriptions   CETIRIZINE (ZYRTEC) 10 MG TABLET    Take 1 tablet (10 mg total) by mouth daily.   DOXYCYCLINE (VIBRAMYCIN) 100 MG CAPSULE    Take 1 capsule (100 mg total) by mouth 2 (two) times daily.   MUPIROCIN OINTMENT (BACTROBAN) 2 %    Apply 1 application topically 2 (two) times daily.      Ok Edwards, PA-C 07/05/17 [phone]

## 2017-07-05 NOTE — ED Triage Notes (Signed)
Pt c/o rash/allergic reaction onset last night... Has a rash on neck   Denies new foods/meds/hygigene products  Also denies: dyspnea, dysphagia  A&O X4... NAD... Brought back on wheel chair.

## 2017-07-05 NOTE — Discharge Instructions (Signed)
Take doxycycline as directed. Bactroban ointment as directed. Zyrtec for itchiness. Ice compress as needed. Follow up with PCP as scheduled to monitor for healing. If experiencing worsening symptoms, spreading redness, increased warmth, fever, follow up for reevaluation.

## 2017-07-08 LAB — AEROBIC CULTURE W GRAM STAIN (SUPERFICIAL SPECIMEN)

## 2017-07-08 LAB — AEROBIC CULTURE  (SUPERFICIAL SPECIMEN): GRAM STAIN: NONE SEEN

## 2017-09-03 ENCOUNTER — Encounter (HOSPITAL_BASED_OUTPATIENT_CLINIC_OR_DEPARTMENT_OTHER): Payer: Medicare Other | Attending: Surgery

## 2017-09-24 ENCOUNTER — Ambulatory Visit: Payer: Self-pay | Admitting: Podiatry

## 2017-10-15 ENCOUNTER — Encounter (HOSPITAL_BASED_OUTPATIENT_CLINIC_OR_DEPARTMENT_OTHER): Payer: Medicare Other | Attending: Physician Assistant

## 2017-10-15 DIAGNOSIS — Z6831 Body mass index (BMI) 31.0-31.9, adult: Secondary | ICD-10-CM | POA: Insufficient documentation

## 2017-10-15 DIAGNOSIS — I890 Lymphedema, not elsewhere classified: Secondary | ICD-10-CM | POA: Insufficient documentation

## 2017-10-15 DIAGNOSIS — L97212 Non-pressure chronic ulcer of right calf with fat layer exposed: Secondary | ICD-10-CM | POA: Insufficient documentation

## 2017-10-15 DIAGNOSIS — I872 Venous insufficiency (chronic) (peripheral): Secondary | ICD-10-CM | POA: Insufficient documentation

## 2017-10-15 DIAGNOSIS — E440 Moderate protein-calorie malnutrition: Secondary | ICD-10-CM | POA: Diagnosis not present

## 2017-10-15 DIAGNOSIS — I10 Essential (primary) hypertension: Secondary | ICD-10-CM | POA: Diagnosis not present

## 2017-10-15 DIAGNOSIS — E119 Type 2 diabetes mellitus without complications: Secondary | ICD-10-CM | POA: Diagnosis not present

## 2017-10-21 DIAGNOSIS — L97212 Non-pressure chronic ulcer of right calf with fat layer exposed: Secondary | ICD-10-CM | POA: Diagnosis not present

## 2017-10-27 DIAGNOSIS — L97212 Non-pressure chronic ulcer of right calf with fat layer exposed: Secondary | ICD-10-CM | POA: Diagnosis not present

## 2017-11-03 ENCOUNTER — Ambulatory Visit: Payer: Self-pay | Admitting: Physician Assistant

## 2017-11-03 DIAGNOSIS — L97212 Non-pressure chronic ulcer of right calf with fat layer exposed: Secondary | ICD-10-CM | POA: Diagnosis not present

## 2017-11-17 ENCOUNTER — Other Ambulatory Visit: Payer: Self-pay | Admitting: *Deleted

## 2017-11-17 ENCOUNTER — Ambulatory Visit: Payer: Self-pay | Admitting: Family Medicine

## 2017-11-17 NOTE — Progress Notes (Deleted)
No chief complaint on file.   HPI  4 review of systems  Past Medical History:  Diagnosis Date  . Acute pericarditis, unspecified   . Arthritis   . Arthritis   . Bilateral swelling of feet   . Cellulitis   . Chronic anemia   . Chronic knee pain    right  . Closed fracture of styloid process of ulna   . Cognitive decline   . Decreased appetite   . Diabetes mellitus    A1C has been normal for past 2 years and takes no diabetic meds at present  . Distal radius fracture 02/04/2016  . Distal radius fracture, left   . Distal radius fracture, left   . ECG abnormal   . Fall   . Fever   . Hypertension   . Hypertension   . Hypoglycemia   . Knee pain, chronic   . Left scapholunate ligament tear   . Lower extremity edema   . Malnutrition of moderate degree ([place])   . Mass of right side of neck   . Microcytic anemia   . Polio   . Polio   . Radius fracture   . Severe protein-calorie malnutrition ([place])   . Swelling of joint, knee, right   . Type 2 diabetes mellitus, controlled ([place])   . Venous insufficiency     Current Outpatient Medications  Medication Sig Dispense Refill  . cetirizine (ZYRTEC) 10 MG tablet Take 1 tablet (10 mg total) by mouth daily. 15 tablet 0  . furosemide (LASIX) 40 MG tablet TAKE 1 TABLET BY MOUTH EVERY DAY 30 tablet 3  . HYDROcodone-acetaminophen (NORCO/VICODIN) 5-325 MG tablet Take 1-2 tablets by mouth every 6 (six) hours as needed for severe pain. 10 tablet 0  . mupirocin ointment (BACTROBAN) 2 % Apply 1 application topically 2 (two) times daily. 22 g 0  . traMADol (ULTRAM) 50 MG tablet TAKE 1 TABLET EVERY 8 HOURS AS NEEDED FOR PAIN 30 tablet 0   No current facility-administered medications for this visit.     Allergies:  Allergies  Allergen Reactions  . Orange Concentrate [Flavoring Agent] Shortness Of Breath, Itching and Swelling  . Peach [Prunus Persica] Shortness Of Breath, Itching and Swelling  . Penicillins Anaphylaxis and Hives    All  'cillins' Has patient had a PCN reaction causing immediate rash, facial/tongue/throat swelling, SOB or lightheadedness with hypotension: Yes Has patient had a PCN reaction causing severe rash involving mucus membranes or skin necrosis: No Has patient had a PCN reaction that required hospitalization No Has patient had a PCN reaction occurring within the last 10 years: No If all of the above answers are "NO", then may proceed with Cephalosporin use.   . Strawberry Extract Shortness Of Breath, Itching and Swelling  . Adhesive [Tape] Itching and Rash    Please use "paper" tape  . Aspirin Itching  . Latex Itching and Rash  . Other Hives, Itching and Rash    NO "-CILLINS"  . Pineapple Itching and Rash  . Strawberry Flavor Itching and Rash    Past Surgical History:  Procedure Laterality Date  . ABDOMINAL HYSTERECTOMY    . OPEN REDUCTION INTERNAL FIXATION (ORIF) DISTAL RADIAL FRACTURE Left 02/29/2016   Procedure: LEFT DISTAL RADIUS REPAIR OF MALUNION WITH OPEN REDUCTION INTERNAL FIXATION (ORIF);  Surgeon: Iran Planas, MD;  Location: [facility];  Service: Orthopedics;  Laterality: Left;    Social History   Socioeconomic History  . Marital status: Widowed    Spouse name: Not  on file  . Number of children: 2  . Years of education: 73  . Highest education level: Not on file  Social Needs  . Financial resource strain: Not on file  . Food insecurity - worry: Not on file  . Food insecurity - inability: Not on file  . Transportation needs - medical: Not on file  . Transportation needs - non-medical: Not on file  Occupational History  . Not on file  Tobacco Use  . Smoking status: Former Smoker    Packs/day: 0.50    Types: Cigarettes    Last attempt to quit: 09/09/1968    Years since quitting: 49.2  . Smokeless tobacco: Never Used  Substance and Sexual Activity  . Alcohol use: No  . Drug use: No  . Sexual activity: No  Other Topics Concern  . Not on file  Social History Narrative   Has  family who helps her get around.     Fun: Read   Denies abuse and feel safe at home.    Family History  Problem Relation Age of Onset  . Ovarian cancer Mother   . COPD Father   . Hypertension Unknown   . High Cholesterol Unknown   . Diabetes Mellitus II Neg Hx   . Lupus Neg Hx   . Sarcoidosis Neg Hx      ROS Review of Systems See HPI Constitution: No fevers or chills No malaise No diaphoresis Skin: No rash or itching Eyes: no blurry vision, no double vision GU: no dysuria or hematuria Neuro: no dizziness or headaches * all others reviewed and negative   Objective: There were no vitals filed for this visit.  Physical Exam  Assessment and Plan There are no diagnoses linked to this encounter.   Kadija Cruzen P [employer]

## 2017-12-09 ENCOUNTER — Encounter: Payer: Self-pay | Admitting: Physician Assistant

## 2017-12-14 IMAGING — DX DG CHEST 2V
2 series · 2 of 2 positions shown · non-contrast
Comparison: Chest radiograph 06/06/2014.

CLINICAL DATA: Patient with right leg pain and swelling. No chest
complaints.

EXAM:
CHEST  2 VIEW

[chest lat]
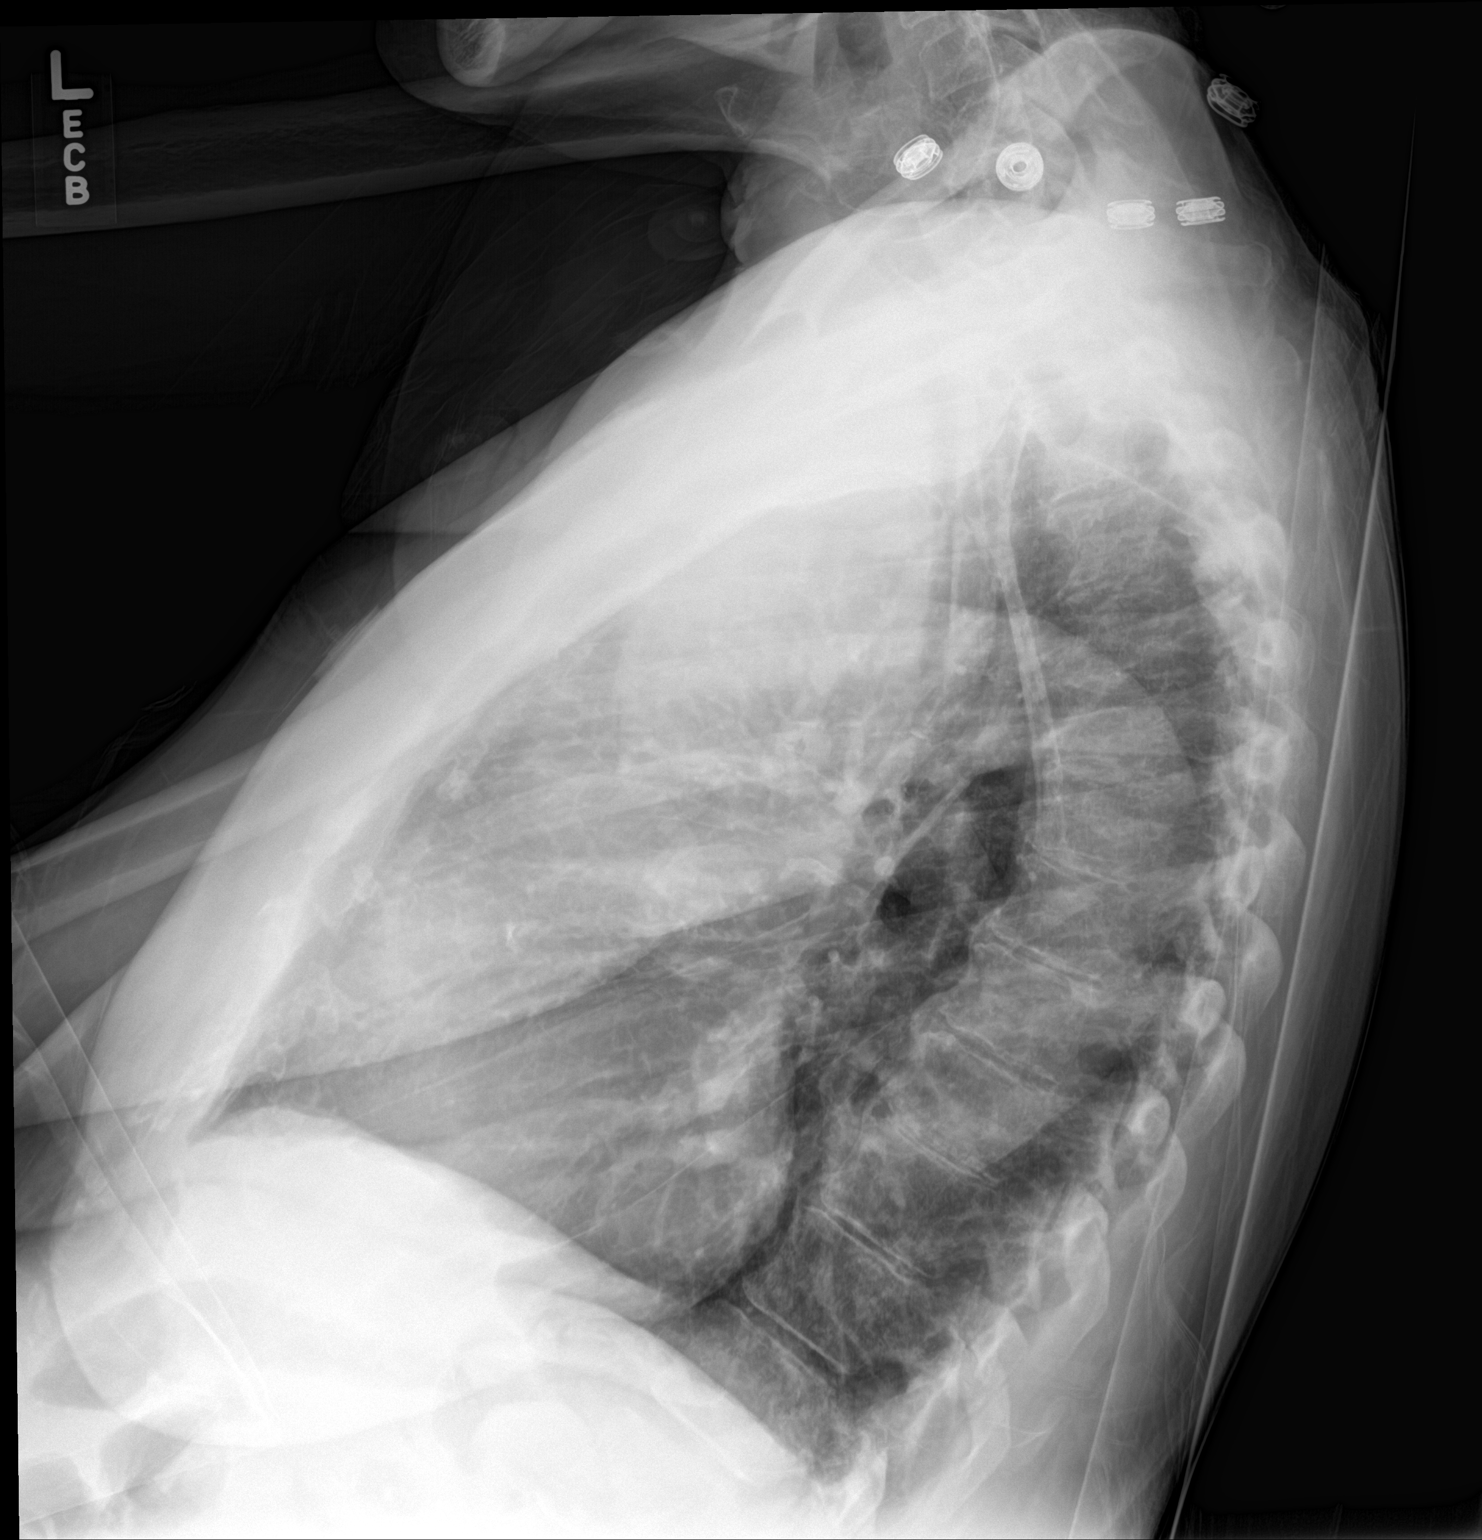

[chest ap]
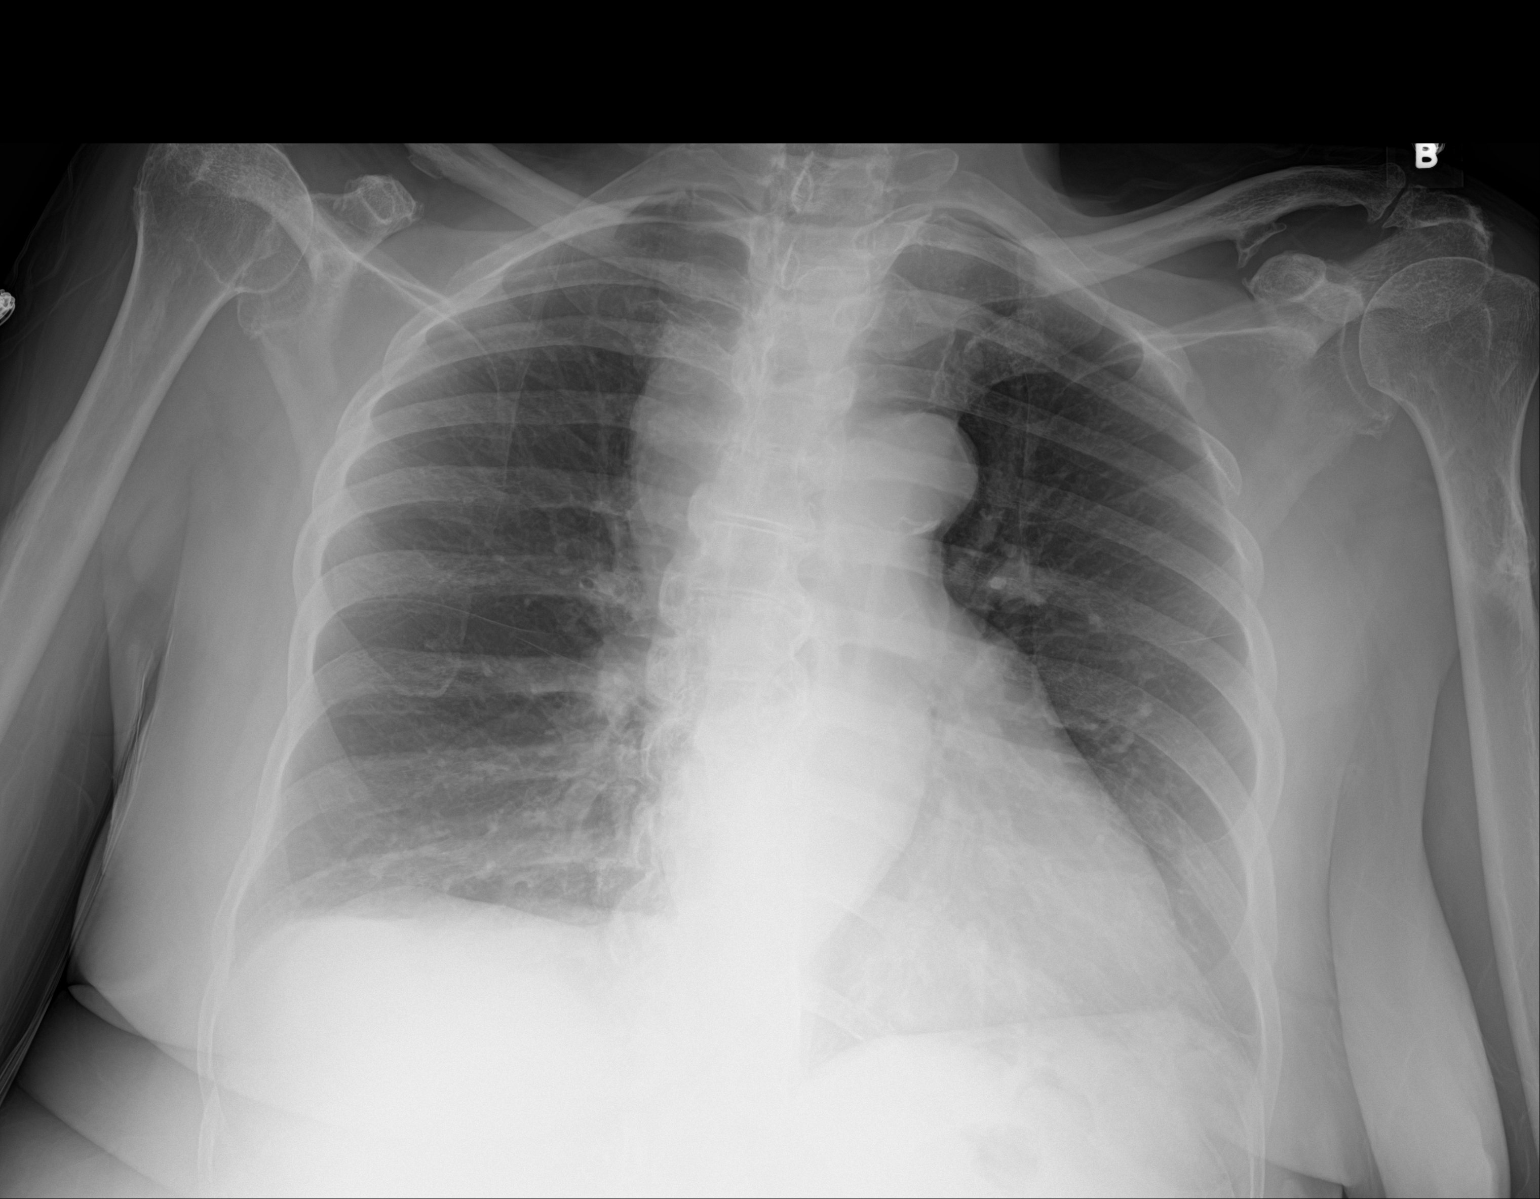

[2 of 2 positions shown; findings below may reference images not displayed]

FINDINGS: Stable cardiac and mediastinal contours. Unchanged right
peritracheal mass. No consolidative pulmonary opacities. No pleural
effusion or pneumothorax. Bilateral shoulder joint degenerative
changes. Thoracic spine degenerative changes.
IMPRESSION: No acute cardiopulmonary process.

## 2017-12-14 IMAGING — DX DG WRIST COMPLETE 3+V*L*
5 series · 5 of 5 positions shown · non-contrast
Comparison: None.

CLINICAL DATA: Left wrist deformity with swelling after fall.

EXAM:
LEFT WRIST - COMPLETE 3+ VIEW

[wrist pa]
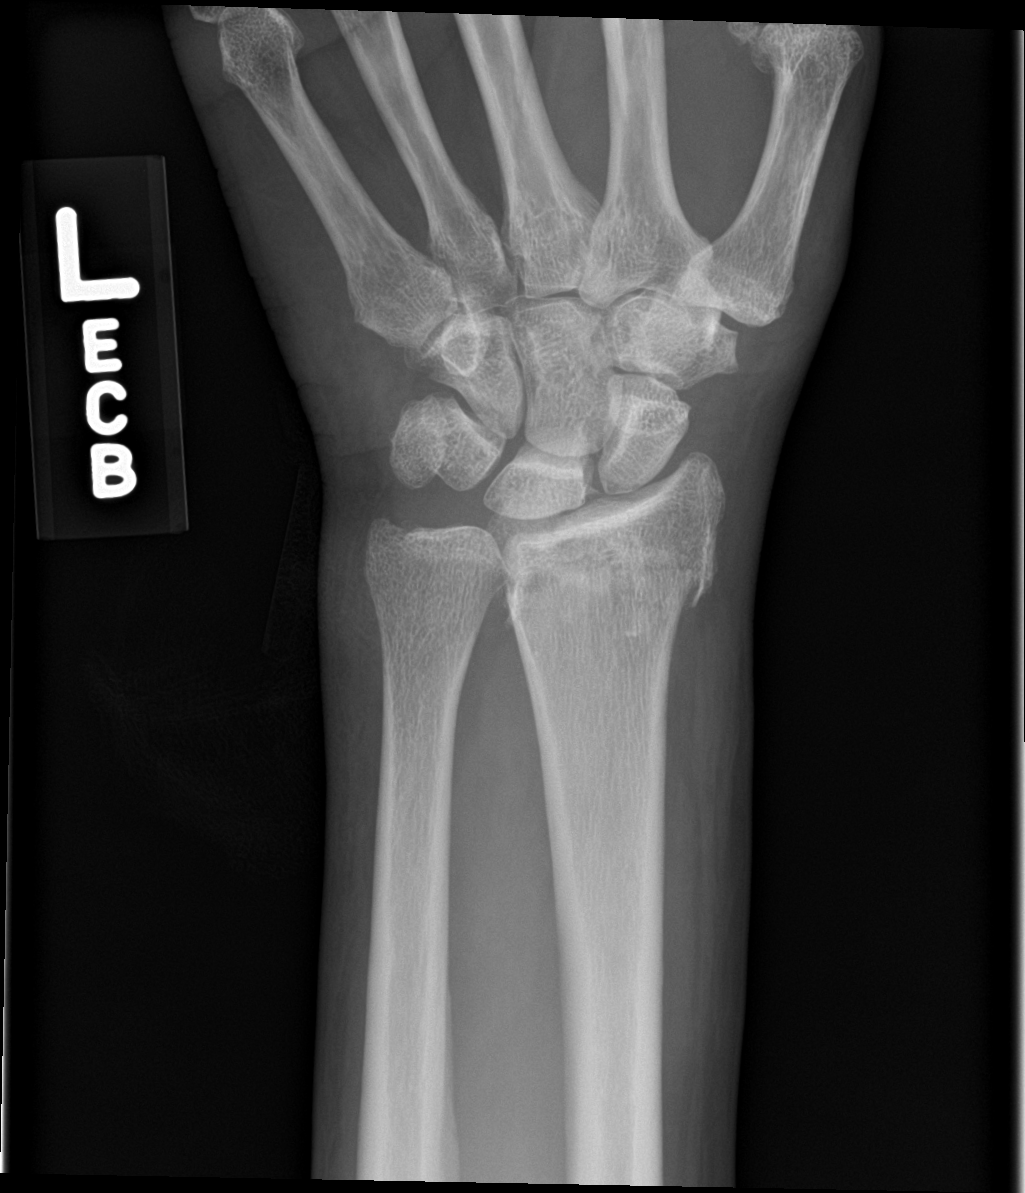

[wrist obl (1 of 2)]
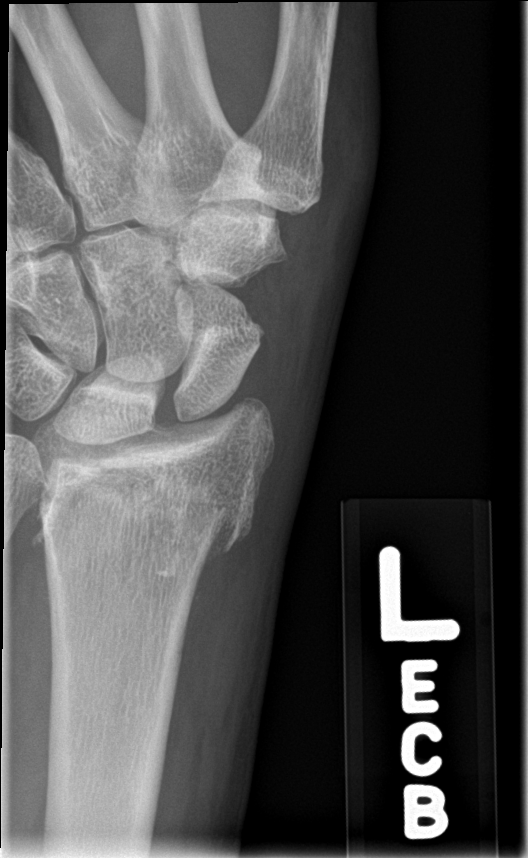

[wrist lat (1 of 2)]
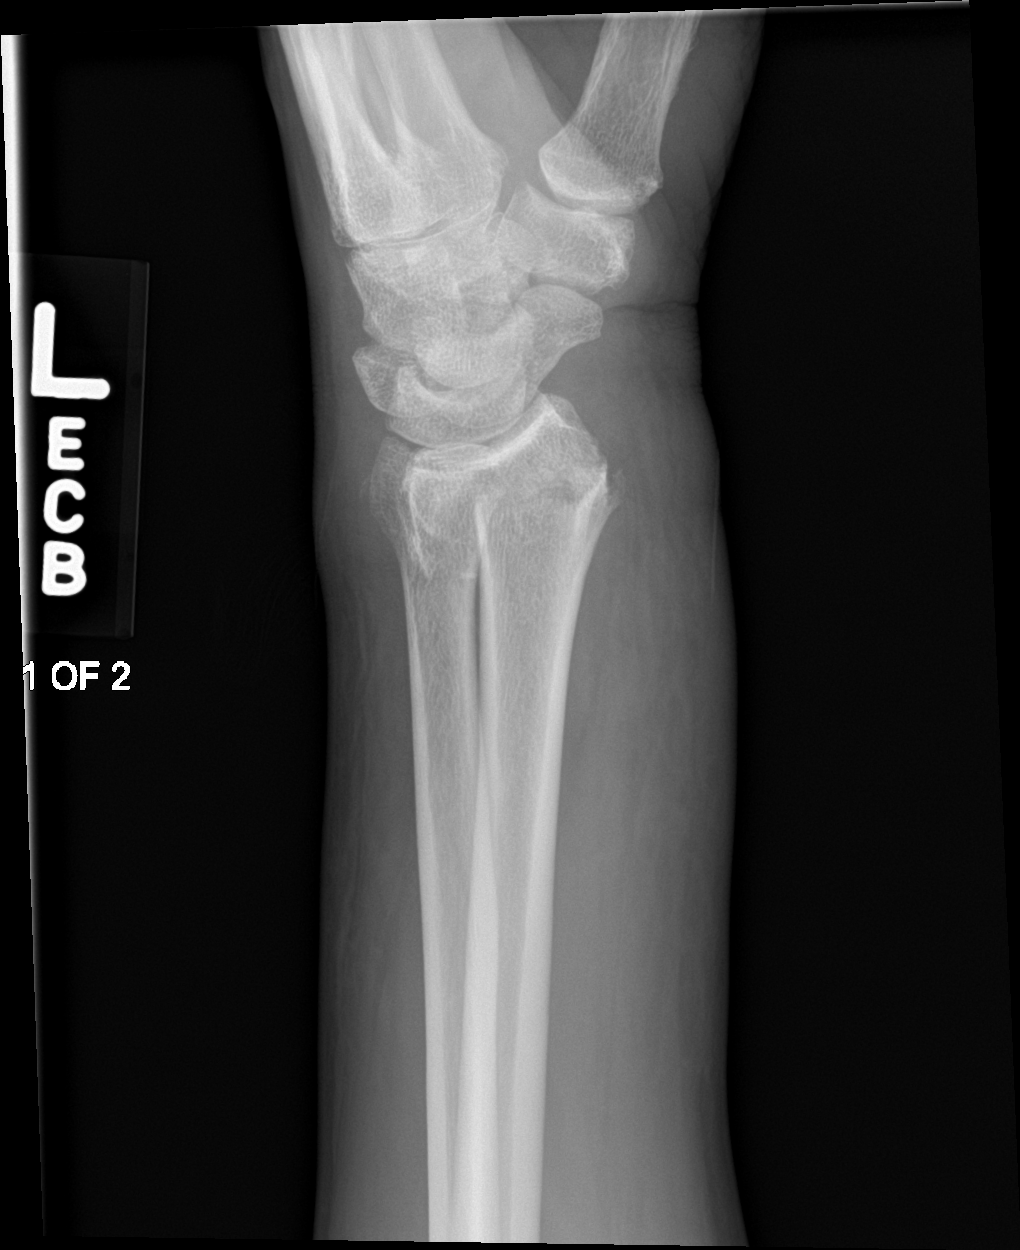

[wrist lat (2 of 2)]
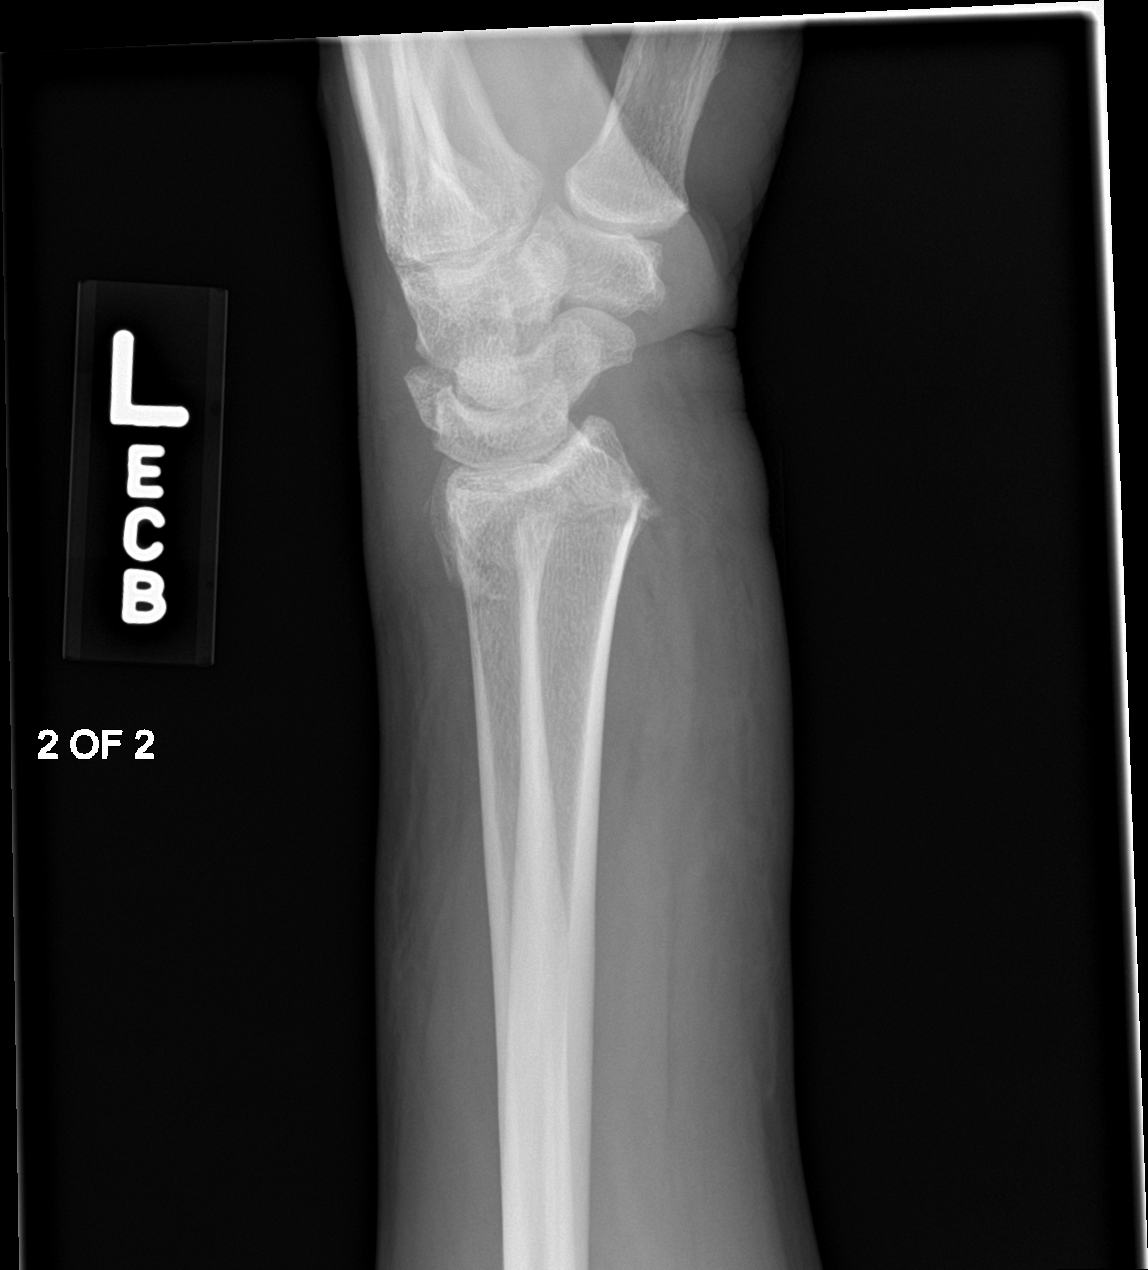

[wrist obl (2 of 2)]
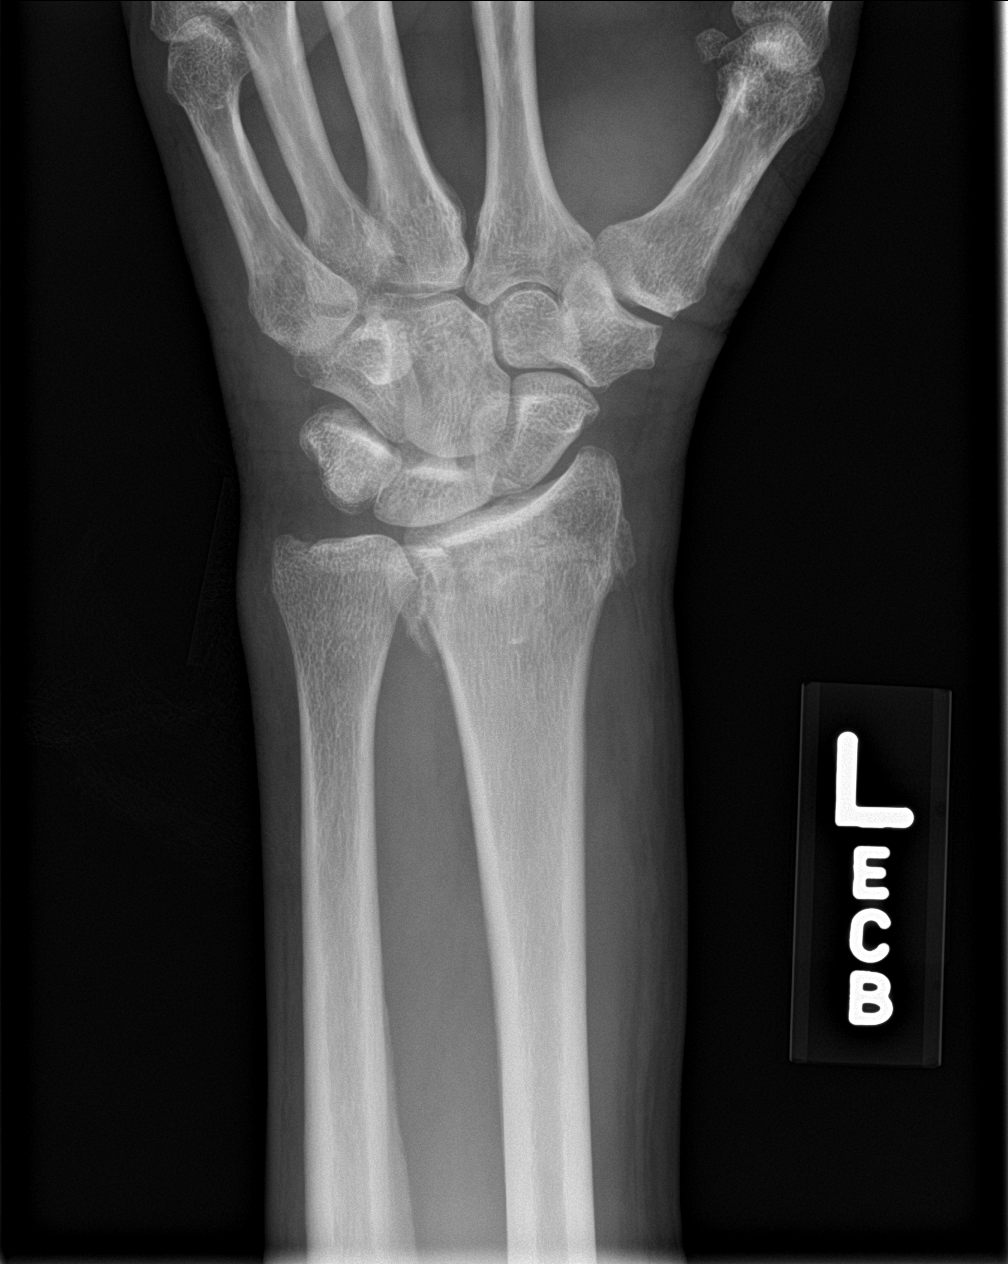

[5 of 5 positions shown; findings below may reference images not displayed]

FINDINGS: Fracture of the distal radius with posterior impaction and apex
anterior angulation. Distal articular surface extension in the
radius is noted medially.

Widened scapholunate interval on the dedicated scaphoid view,
suggesting possible scapholunate ligament tear. Scapholunate angle
on the lateral projection normal.

Subtle periosteal reaction along the base of the ulnar styloid,
suspicious for ulnar styloid fracture, nondisplaced.
IMPRESSION: 1. Distal radial fracture with transverse metaphyseal component and
a component extending to the distal radial articular surface
medially.
2. Nondisplaced ulnar styloid fracture.
3. Potential tear of the scapholunate ligament.

## 2017-12-15 ENCOUNTER — Ambulatory Visit: Payer: Self-pay | Admitting: Family Medicine

## 2017-12-15 ENCOUNTER — Telehealth: Payer: Self-pay | Admitting: Family Medicine

## 2017-12-15 NOTE — Telephone Encounter (Signed)
Left message for her to call back to reschedule

## 2017-12-25 ENCOUNTER — Encounter: Payer: Self-pay | Admitting: Family Medicine

## 2017-12-25 ENCOUNTER — Ambulatory Visit: Payer: Medicare Other | Admitting: Family Medicine

## 2017-12-25 ENCOUNTER — Other Ambulatory Visit: Payer: Self-pay

## 2017-12-25 VITALS — BP 150/74 | HR 69 | Temp 98.4°F | Resp 17 | Ht 67.0 in | Wt 196.6 lb

## 2017-12-25 DIAGNOSIS — R4189 Other symptoms and signs involving cognitive functions and awareness: Secondary | ICD-10-CM

## 2017-12-25 DIAGNOSIS — L0100 Impetigo, unspecified: Secondary | ICD-10-CM

## 2017-12-25 DIAGNOSIS — I872 Venous insufficiency (chronic) (peripheral): Secondary | ICD-10-CM

## 2017-12-25 DIAGNOSIS — E11620 Type 2 diabetes mellitus with diabetic dermatitis: Secondary | ICD-10-CM

## 2017-12-25 NOTE — Progress Notes (Signed)
Chief Complaint  Patient presents with  . New Patient (Initial Visit)    establish care.  No concerns    HPI  Chronic Lower Extremity Edema Patient reports that she has chronic swelling in the legs bilaterally She reports that she is switching to a new doctor  Her previous PCP was at [facility] on [address] She reports that her doctor is leaving and she was given a new provider  Cognitive Decline She has a history of chronic venous stasis and diabetes She was in SNF transferred from home due to weakness and declining cognition.  She was admitted 04/07/17.    Diabetes mellitus She has a history of well controlled diabetes She is now discharged to home where she gets meals prepared by herself or her granddaughters She has a lot of food allergies but watches out for her sugars Lab Results  Component Value Date   HGBA1C 5.2 03/24/2017   Skin rashes She also has a history of chronic skin rashes and chronic bilateral lower extremity edema due to venous stasis She reports that she gets itching from skin reactions to foods which makes her legs swell up even more and causes itching She had a recent episode of skin rash which she attributes to eating pineapples and started using a topical ointment (her med list shows bactroban) which helped her skin   Past Medical History:  Diagnosis Date  . Acute pericarditis, unspecified   . Arthritis   . Arthritis   . Bilateral swelling of feet   . Cellulitis   . Chronic anemia   . Chronic knee pain    right  . Closed fracture of styloid process of ulna   . Cognitive decline   . Decreased appetite   . Diabetes mellitus    A1C has been normal for past 2 years and takes no diabetic meds at present  . Distal radius fracture 02/04/2016  . Distal radius fracture, left   . Distal radius fracture, left   . ECG abnormal   . Fall   . Fever   . Hypertension   . Hypertension   . Hypoglycemia   . Knee pain, chronic   . Left scapholunate  ligament tear   . Lower extremity edema   . Malnutrition of moderate degree ([place])   . Mass of right side of neck   . Microcytic anemia   . Polio   . Polio   . Radius fracture   . Severe protein-calorie malnutrition ([place])   . Swelling of joint, knee, right   . Type 2 diabetes mellitus, controlled ([place])   . Venous insufficiency     Current Outpatient Medications  Medication Sig Dispense Refill  . cetirizine (ZYRTEC) 10 MG tablet Take 1 tablet (10 mg total) by mouth daily. 15 tablet 0  . traMADol (ULTRAM) 50 MG tablet TAKE 1 TABLET EVERY 8 HOURS AS NEEDED FOR PAIN 30 tablet 0  . furosemide (LASIX) 40 MG tablet TAKE 1 TABLET BY MOUTH EVERY DAY (Patient not taking: Reported on 12/25/2017) 30 tablet 3  . HYDROcodone-acetaminophen (NORCO/VICODIN) 5-325 MG tablet Take 1-2 tablets by mouth every 6 (six) hours as needed for severe pain. (Patient not taking: Reported on 12/25/2017) 10 tablet 0  . mupirocin ointment (BACTROBAN) 2 % Apply 1 application topically 2 (two) times daily. 22 g 0   No current facility-administered medications for this visit.     Allergies:  Allergies  Allergen Reactions  . Orange Concentrate [Flavoring Agent] Shortness Of Breath,  Itching and Swelling  . Peach [Prunus Persica] Shortness Of Breath, Itching and Swelling  . Penicillins Anaphylaxis and Hives    All 'cillins' Has patient had a PCN reaction causing immediate rash, facial/tongue/throat swelling, SOB or lightheadedness with hypotension: Yes Has patient had a PCN reaction causing severe rash involving mucus membranes or skin necrosis: No Has patient had a PCN reaction that required hospitalization No Has patient had a PCN reaction occurring within the last 10 years: No If all of the above answers are "NO", then may proceed with Cephalosporin use.   . Strawberry Extract Shortness Of Breath, Itching and Swelling  . Adhesive [Tape] Itching and Rash    Please use "paper" tape  . Aspirin Itching  . Latex Itching  and Rash  . Other Hives, Itching and Rash    NO "-CILLINS"  . Pineapple Itching and Rash  . Strawberry Flavor Itching and Rash    Past Surgical History:  Procedure Laterality Date  . ABDOMINAL HYSTERECTOMY    . OPEN REDUCTION INTERNAL FIXATION (ORIF) DISTAL RADIAL FRACTURE Left 02/29/2016   Procedure: LEFT DISTAL RADIUS REPAIR OF MALUNION WITH OPEN REDUCTION INTERNAL FIXATION (ORIF);  Surgeon: Iran Planas, MD;  Location: [facility];  Service: Orthopedics;  Laterality: Left;    Social History   Socioeconomic History  . Marital status: Widowed    Spouse name: Not on file  . Number of children: 2  . Years of education: 73  . Highest education level: Not on file  Occupational History  . Not on file  Social Needs  . Financial resource strain: Not on file  . Food insecurity:    Worry: Not on file    Inability: Not on file  . Transportation needs:    Medical: Not on file    Non-medical: Not on file  Tobacco Use  . Smoking status: Former Smoker    Packs/day: 0.50    Types: Cigarettes    Last attempt to quit: 09/09/1968    Years since quitting: 49.3  . Smokeless tobacco: Never Used  Substance and Sexual Activity  . Alcohol use: No  . Drug use: No  . Sexual activity: Never  Lifestyle  . Physical activity:    Days per week: Not on file    Minutes per session: Not on file  . Stress: Not on file  Relationships  . Social connections:    Talks on phone: Not on file    Gets together: Not on file    Attends religious service: Not on file    Active member of club or organization: Not on file    Attends meetings of clubs or organizations: Not on file    Relationship status: Not on file  Other Topics Concern  . Not on file  Social History Narrative   Has family who helps her get around.     Fun: Read   Denies abuse and feel safe at home.    Family History  Problem Relation Age of Onset  . Ovarian cancer Mother   . COPD Father   . Hypertension Unknown   . High Cholesterol  Unknown   . Diabetes Mellitus II Neg Hx   . Lupus Neg Hx   . Sarcoidosis Neg Hx      Review of Systems  Constitutional: Negative for chills and fever.  Cardiovascular: Negative for chest pain, palpitations and leg swelling.  Gastrointestinal: Negative for abdominal pain, nausea and vomiting.  Genitourinary: Negative for dysuria and urgency.  Skin: Positive for  itching and rash.  Neurological: Negative for dizziness, tingling and headaches.      Objective: Vitals:   12/25/17 1353  BP: (!) 150/74  Pulse: 69  Resp: 17  Temp: 98.4 F (36.9 C)  TempSrc: Oral  SpO2: 99%  Weight: 196 lb 9.6 oz (89.2 kg)  Height: 5\' 7"  (1.702 m)     Physical Exam  Constitutional: She appears well-developed and well-nourished.  HENT:  Head: Normocephalic and atraumatic.  Eyes: Conjunctivae and EOM are normal.  Neck: Normal range of motion. No thyromegaly present.  Cardiovascular: Normal rate, regular rhythm and normal heart sounds.  Pulmonary/Chest: Effort normal and breath sounds normal. No stridor. No respiratory distress. She has no wheezes.  Abdominal: Soft. Bowel sounds are normal. She exhibits no distension. There is no tenderness. There is no guarding.  Musculoskeletal:  Bilateral lower extremity edema with pitting to the mid-shin  Neurological: She is alert.  Oriented to self and place but not to time  Skin: Skin is warm. Capillary refill takes less than 2 seconds.  Weeping hyperpigmented skin   Psychiatric: She has a normal mood and affect. Her behavior is normal. Judgment and thought content normal.   Impressions:  - LVEF 60-65%, normal wall thickness, diastolic dysfunction, indeterminate LV filling pressure. Sclerotic anterior mitral leaflet with trace to mild regurgitation.  Transthoracic echocardiography. M-mode, complete 2D, spectral Doppler, and color Doppler. Birthdate: Patient birthdate: 02-25-1946. Age: Patient is 72 yr old. Sex: Gender: female. BMI: 32.9  kg/m^2. Blood pressure:   127/70 Patient status: Inpatient. Study date: Study date: 06/07/2014. Study time: 12:47 PM. Location: Bedside.   Assessment and Plan [name] was seen today for new patient (initial visit).  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, without long-term current use of insulin ([place]) -   Per records her diabetes is well controlled and since pt reports that she just had labs will await the records from [facility]  Venous insufficiency of both lower extremities, chronic- chronic lower extremity swelling Since pt reports that she just had labs will get records Continue cardiology recs and salt restriction  Cognitive decline- patient is a poor historian and does not remember some of the reasons for admission to nursing homes  Impetigo- pt with history of impetigo  Continue bactroban Continue to keep legs elevated     Davielle Lingelbach A Nolon Rod

## 2017-12-25 NOTE — Patient Instructions (Signed)
     IF you received an x-ray today, you will receive an invoice from [facility]. Please contact [facility] at [phone] with questions or concerns regarding your invoice.   IF you received labwork today, you will receive an invoice from [facility]. Please contact [facility] at [phone] with questions or concerns regarding your invoice.   Our billing staff will not be able to assist you with questions regarding bills from these companies.  You will be contacted with the lab results as soon as they are available. The fastest way to get your results is to activate your My Chart account. Instructions are located on the last page of this paperwork. If you have not heard from us regarding the results in 2 weeks, please contact this office.     

## 2018-01-12 ENCOUNTER — Telehealth: Payer: Self-pay | Admitting: General Practice

## 2018-01-12 ENCOUNTER — Telehealth: Payer: Self-pay | Admitting: Family Medicine

## 2018-01-12 ENCOUNTER — Ambulatory Visit: Payer: Medicare Other | Admitting: Family Medicine

## 2018-01-12 NOTE — Telephone Encounter (Signed)
Already advised.

## 2018-01-12 NOTE — Telephone Encounter (Signed)
Pt's sister called and wanted to speak with Dr. Nolon Rod about where she can place [name] for her to get her medications on time and so she can get bathe. Her telephone number is [phone]. And she had an appt today, but sister called to reschedule to 01-19-2018 because she was still at work and couldn't bring [name].

## 2018-01-12 NOTE — Telephone Encounter (Signed)
Copied from [place] [id number]. Topic: Quick Communication - See Telephone Encounter >> Jan 12, 2018 12:40 PM Arletha Grippe wrote: CRM for notification. See Telephone encounter for: 01/12/18. Sister joann meadows called - she is needing help to get pt admitted into rehab.  She is asking for provider to call her back at [phone]

## 2018-01-12 NOTE — Telephone Encounter (Signed)
Advised pt sister that a face to face visit will be needed in order to accommodate this. Pt has OV for today but she stated they may need to reschedule. Transferred to scheduling.

## 2018-01-12 NOTE — Telephone Encounter (Signed)
Disregard prev not by me. Advised pt earlier today that she needed to come in for visit to discuss plans for care of pt. Sister agreed to this but stated she was unsure if she could make it to appt today. Please advise if you have any suggestions until f/u visit.

## 2018-01-19 ENCOUNTER — Ambulatory Visit: Payer: Medicare Other | Admitting: Family Medicine

## 2018-01-19 ENCOUNTER — Other Ambulatory Visit: Payer: Self-pay

## 2018-01-19 ENCOUNTER — Encounter: Payer: Self-pay | Admitting: Family Medicine

## 2018-01-19 VITALS — BP 148/80 | Temp 98.5°F | Ht 67.0 in | Wt 196.0 lb

## 2018-01-19 DIAGNOSIS — Z9181 History of falling: Secondary | ICD-10-CM | POA: Diagnosis not present

## 2018-01-19 DIAGNOSIS — E1121 Type 2 diabetes mellitus with diabetic nephropathy: Secondary | ICD-10-CM | POA: Diagnosis not present

## 2018-01-19 DIAGNOSIS — I872 Venous insufficiency (chronic) (peripheral): Secondary | ICD-10-CM

## 2018-01-19 DIAGNOSIS — R4189 Other symptoms and signs involving cognitive functions and awareness: Secondary | ICD-10-CM | POA: Diagnosis not present

## 2018-01-19 LAB — POCT GLYCOSYLATED HEMOGLOBIN (HGB A1C): HEMOGLOBIN A1C: 5.7

## 2018-01-19 MED ORDER — PREGABALIN 50 MG PO CAPS: 50.0000 mg | ORAL_CAPSULE | Freq: Two times a day (BID) | ORAL | 0 refills | Status: DC

## 2018-01-19 MED ORDER — FUROSEMIDE 20 MG PO TABS: 20.0000 mg | ORAL_TABLET | Freq: Every day | ORAL | 3 refills | Status: DC

## 2018-01-19 NOTE — Progress Notes (Signed)
Chief Complaint  Patient presents with  . Follow-up    leg swelling denies any pain today, just a  little tenderness along the dark areas    HPI   Patient has a history of chronic lower extremity edema  Her guarding called answering service about her pain and reports that he did not feel like they cared She has chronic pain from her venous insufficiency She does not wear compression stockings    Past Medical History:  Diagnosis Date  . Acute pericarditis, unspecified   . Arthritis   . Bilateral swelling of feet   . Cellulitis   . Chronic anemia   . Chronic knee pain    right  . Closed fracture of styloid process of ulna   . Cognitive decline   . Decreased appetite   . Diabetes mellitus    A1C has been normal for past 2 years and takes no diabetic meds at present  . Distal radius fracture 02/04/2016  . ECG abnormal   . Fall   . Hypertension   . Hypoglycemia   . Knee pain, chronic   . Left scapholunate ligament tear   . Lower extremity edema   . Mass of right side of neck   . Microcytic anemia   . Polio   . Radius fracture   . Severe protein-calorie malnutrition ([place])   . Swelling of joint, knee, right   . Type 2 diabetes mellitus, controlled ([place])   . Venous insufficiency     Current Outpatient Medications  Medication Sig Dispense Refill  . acetaminophen (TYLENOL) 325 MG tablet Take 2 tablets (650 mg total) by mouth every 6 (six) hours as needed for mild pain (or Fever >/= 101). (Patient not taking: Reported on 04/27/2018)    . furosemide (LASIX) 40 MG tablet Take 1 tablet (40 mg total) by mouth daily. Needs office visit. 90 tablet 0  . potassium chloride (K-DUR,KLOR-CON) 10 MEQ tablet Take 1 tablet (10 mEq total) by mouth daily.    . pregabalin (LYRICA) 50 MG capsule TAKE 1 CAPSULE (50 MG TOTAL) BY MOUTH 2 (TWO) TIMES DAILY. 60 capsule 3  . traMADol (ULTRAM) 50 MG tablet Take 1 tablet (50 mg total) by mouth every 6 (six) hours as needed for moderate pain. (Patient  not taking: Reported on 04/27/2018) 10 tablet 0   No current facility-administered medications for this visit.     Allergies:  Allergies  Allergen Reactions  . Orange Concentrate [Flavoring Agent] Shortness Of Breath, Itching and Swelling  . Peach [Prunus Persica] Shortness Of Breath, Itching and Swelling  . Penicillins Anaphylaxis and Hives    All 'cillins' Has patient had a PCN reaction causing immediate rash, facial/tongue/throat swelling, SOB or lightheadedness with hypotension: Yes Has patient had a PCN reaction causing severe rash involving mucus membranes or skin necrosis: No Has patient had a PCN reaction that required hospitalization No Has patient had a PCN reaction occurring within the last 10 years: No If all of the above answers are "NO", then may proceed with Cephalosporin use.   . Strawberry Extract Shortness Of Breath, Itching and Swelling  . Adhesive [Tape] Itching and Rash    Please use "paper" tape  . Aspirin Itching  . Latex Itching and Rash  . Other Hives, Itching and Rash    NO "-CILLINS"  . Pineapple Itching and Rash  . Strawberry Flavor Itching and Rash    Past Surgical History:  Procedure Laterality Date  . ABDOMINAL HYSTERECTOMY    .  OPEN REDUCTION INTERNAL FIXATION (ORIF) DISTAL RADIAL FRACTURE Left 02/29/2016   Procedure: LEFT DISTAL RADIUS REPAIR OF MALUNION WITH OPEN REDUCTION INTERNAL FIXATION (ORIF);  Surgeon: Iran Planas, MD;  Location: [facility];  Service: Orthopedics;  Laterality: Left;    Social History   Socioeconomic History  . Marital status: Widowed    Spouse name: Not on file  . Number of children: 2  . Years of education: 23  . Highest education level: Not on file  Occupational History  . Not on file  Social Needs  . Financial resource strain: Not on file  . Food insecurity:    Worry: Not on file    Inability: Not on file  . Transportation needs:    Medical: Not on file    Non-medical: Not on file  Tobacco Use  . Smoking  status: Former Smoker    Packs/day: 0.50    Types: Cigarettes    Last attempt to quit: 09/09/1968    Years since quitting: 49.7  . Smokeless tobacco: Never Used  Substance and Sexual Activity  . Alcohol use: No  . Drug use: No  . Sexual activity: Never  Lifestyle  . Physical activity:    Days per week: Not on file    Minutes per session: Not on file  . Stress: Not on file  Relationships  . Social connections:    Talks on phone: Not on file    Gets together: Not on file    Attends religious service: Not on file    Active member of club or organization: Not on file    Attends meetings of clubs or organizations: Not on file    Relationship status: Not on file  Other Topics Concern  . Not on file  Social History Narrative   Has family who helps her get around.     Fun: Read   Denies abuse and feel safe at home.    Family History  Problem Relation Age of Onset  . Ovarian cancer Mother   . COPD Father   . Hypertension Unknown   . High Cholesterol Unknown   . Diabetes Mellitus II Neg Hx   . Lupus Neg Hx   . Sarcoidosis Neg Hx      ROS Review of Systems See HPI Constitution: No fevers or chills No malaise No diaphoresis Skin: No rash or itching Eyes: no blurry vision, no double vision GU: no dysuria or hematuria Neuro: no dizziness or headaches all others reviewed and negative   Objective: Vitals:   01/19/18 1352  BP: (!) 148/80  Temp: 98.5 F (36.9 C)  TempSrc: Oral  SpO2: 98%  Weight: 196 lb (88.9 kg)  Height: 5\' 7"  (1.702 m)    Physical Exam  Constitutional: She is oriented to person, place, and time. She appears well-developed and well-nourished.  HENT:  Head: Normocephalic and atraumatic.  Cardiovascular: Normal rate and regular rhythm.  Pulmonary/Chest: Effort normal and breath sounds normal. No respiratory distress.  Musculoskeletal:  Lower extremity with edema and hyperpigmentation  Neurological: She is alert and oriented to person, place, and  time.     Assessment and Plan Shawanda was seen today for follow-up.  Diagnoses and all orders for this visit:  Controlled type 2 diabetes mellitus with diabetic nephropathy, without long-term current use of insulin ([facility]) -     POCT glycosylated hemoglobin (Hb A1C) -     Lipid panel  Venous insufficiency of both lower extremities, chronic- continue lyrica and furosemide  -  Comprehensive metabolic panel  At high risk for falls- recommend home health care with PT  -     Ambulatory referral to [facility]  Cognitive decline  Other orders -     Discontinue: pregabalin (LYRICA) 50 MG capsule; Take 1 capsule (50 mg total) by mouth 2 (two) times daily. -     Discontinue: furosemide (LASIX) 20 MG tablet; Take 1 tablet (20 mg total) by mouth daily. (Patient taking differently: Take 20 mg by mouth daily as needed for fluid. )     Prima Rayner A Nolon Rod

## 2018-01-19 NOTE — Patient Instructions (Addendum)
   IF you received an x-ray today, you will receive an invoice from [facility]. Please contact [facility] at [phone] with questions or concerns regarding your invoice.   IF you received labwork today, you will receive an invoice from [facility]. Please contact [facility] at [phone] with questions or concerns regarding your invoice.   Our billing staff will not be able to assist you with questions regarding bills from these companies.  You will be contacted with the lab results as soon as they are available. The fastest way to get your results is to activate your My Chart account. Instructions are located on the last page of this paperwork. If you have not heard from us regarding the results in 2 weeks, please contact this office.    Fall Prevention in the Home Falls can cause injuries and can affect people from all age groups. There are many simple things that you can do to make your home safe and to help prevent falls. What can I do on the outside of my home?  Regularly repair the edges of walkways and driveways and fix any cracks.  Remove high doorway thresholds.  Trim any shrubbery on the main path into your home.  Use bright outdoor lighting.  Clear walkways of debris and clutter, including tools and rocks.  Regularly check that handrails are securely fastened and in good repair. Both sides of any steps should have handrails.  Install guardrails along the edges of any raised decks or porches.  Have leaves, snow, and ice cleared regularly.  Use sand or salt on walkways during winter months.  In the garage, clean up any spills right away, including grease or oil spills. What can I do in the bathroom?  Use night lights.  Install grab bars by the toilet and in the tub and shower. Do not use towel bars as grab bars.  Use non-skid mats or decals on the floor of the tub or shower.  If you need to sit down while you are in the shower, use a plastic,  non-slip stool.  Keep the floor dry. Immediately clean up any water that spills on the floor.  Remove soap buildup in the tub or shower on a regular basis.  Attach bath mats securely with double-sided non-slip rug tape.  Remove throw rugs and other tripping hazards from the floor. What can I do in the bedroom?  Use night lights.  Make sure that a bedside light is easy to reach.  Do not use oversized bedding that drapes onto the floor.  Have a firm chair that has side arms to use for getting dressed.  Remove throw rugs and other tripping hazards from the floor. What can I do in the kitchen?  Clean up any spills right away.  Avoid walking on wet floors.  Place frequently used items in easy-to-reach places.  If you need to reach for something above you, use a sturdy step stool that has a grab bar.  Keep electrical cables out of the way.  Do not use floor polish or wax that makes floors slippery. If you have to use wax, make sure that it is non-skid floor wax.  Remove throw rugs and other tripping hazards from the floor. What can I do in the stairways?  Do not leave any items on the stairs.  Make sure that there are handrails on both sides of the stairs. Fix handrails that are broken or loose. Make sure that handrails are as long as the   stairways.  Check any carpeting to make sure that it is firmly attached to the stairs. Fix any carpet that is loose or worn.  Avoid having throw rugs at the top or bottom of stairways, or secure the rugs with carpet tape to prevent them from moving.  Make sure that you have a light switch at the top of the stairs and the bottom of the stairs. If you do not have them, have them installed. What are some other fall prevention tips?  Wear closed-toe shoes that fit well and support your feet. Wear shoes that have rubber soles or low heels.  When you use a stepladder, make sure that it is completely opened and that the sides are firmly locked.  Have someone hold the ladder while you are using it. Do not climb a closed stepladder.  Add color or contrast paint or tape to grab bars and handrails in your home. Place contrasting color strips on the first and last steps.  Use mobility aids as needed, such as canes, walkers, scooters, and crutches.  Turn on lights if it is dark. Replace any light bulbs that burn out.  Set up furniture so that there are clear paths. Keep the furniture in the same spot.  Fix any uneven floor surfaces.  Choose a carpet design that does not hide the edge of steps of a stairway.  Be aware of any and all pets.  Review your medicines with your healthcare provider. Some medicines can cause dizziness or changes in blood pressure, which increase your risk of falling. Talk with your health care provider about other ways that you can decrease your risk of falls. This may include working with a physical therapist or trainer to improve your strength, balance, and endurance. This information is not intended to replace advice given to you by your health care provider. Make sure you discuss any questions you have with your health care provider. Document Released: 08/16/2002 Document Revised: 01/23/2016 Document Reviewed: 09/30/2014 Elsevier Interactive Patient Education  2018 [employer]vier Inc.  

## 2018-01-20 LAB — COMPREHENSIVE METABOLIC PANEL
A/G RATIO: 1.1 — AB (ref 1.2–2.2)
ALT: 6 IU/L (ref 0–32)
AST: 13 IU/L (ref 0–40)
Albumin: 4.1 g/dL (ref 3.5–4.8)
Alkaline Phosphatase: 99 IU/L (ref 39–117)
BUN/Creatinine Ratio: 18 (ref 12–28)
BUN: 13 mg/dL (ref 8–27)
Bilirubin Total: 0.5 mg/dL (ref 0.0–1.2)
CALCIUM: 9.5 mg/dL (ref 8.7–10.3)
CO2: 22 mmol/L (ref 20–29)
CREATININE: 0.74 mg/dL (ref 0.57–1.00)
Chloride: 105 mmol/L (ref 96–106)
GFR, EST AFRICAN AMERICAN: 92 mL/min/{1.73_m2} (ref >59)
GFR, EST NON AFRICAN AMERICAN: 80 mL/min/{1.73_m2} (ref >59)
Globulin, Total: 3.7 g/dL (ref 1.5–4.5)
Glucose: 69 mg/dL (ref 65–99)
POTASSIUM: 4.5 mmol/L (ref 3.5–5.2)
Sodium: 142 mmol/L (ref 134–144)
TOTAL PROTEIN: 7.8 g/dL (ref 6.0–8.5)

## 2018-01-20 LAB — LIPID PANEL
CHOL/HDL RATIO: 3.7 ratio (ref 0.0–4.4)
Cholesterol, Total: 228 mg/dL — ABNORMAL HIGH (ref 100–199)
HDL: 61 mg/dL (ref >39)
LDL CALC: 149 mg/dL — AB (ref 0–99)
TRIGLYCERIDES: 90 mg/dL (ref 0–149)
VLDL CHOLESTEROL CAL: 18 mg/dL (ref 5–40)

## 2018-02-03 NOTE — Telephone Encounter (Signed)
Copied from [place] [id number]. Topic: Quick Communication - See Telephone Encounter >> Feb 03, 2018  2:02 PM Robina Ade, Helene Kelp D wrote: CRM for notification. See Telephone encounter for: 02/03/18. Patient would like for Dr. Bridget Hartshorn to prescribe her something else other then lyrica that doesn't have too many side effects. She would like to talk to a CMA about this.

## 2018-02-04 ENCOUNTER — Telehealth: Payer: Self-pay | Admitting: Family Medicine

## 2018-02-04 NOTE — Telephone Encounter (Signed)
New encounter created for medication request.

## 2018-02-04 NOTE — Telephone Encounter (Signed)
Copied from [place] [phone]. Topic: Quick Communication - See Telephone Encounter >> Feb 03, 2018  2:02 PM Robina Ade, Helene Kelp D wrote: CRM for notification. See Telephone encounter for: 02/03/18. Patient would like for Dr. Bridget Hartshorn to prescribe her something else other then lyrica that doesn't have too many side effects. She would like to talk to a CMA about this.

## 2018-02-04 NOTE — Telephone Encounter (Signed)
Phone call to patient. Unable to reach. If patient calls back, what side effects is she having from the Lyrica? How is she taking the medication (with or before meals? In am? In pm?)?

## 2018-02-05 NOTE — Telephone Encounter (Signed)
[facility] called stating pt has not taken medication yet. Pt and her sister red the side effects and got worried. They want to take something else beside Lyrica.

## 2018-02-05 NOTE — Telephone Encounter (Signed)
Pts sister Mignon Pine called regarding Lyrica side affects According to sister patient has not taken any of the Lyrica yet - She read the side affects in the insert and would like something else. The medication has been picked up from pharmacy but patient has not taken any  yet.  POC Sister  Mignon Pine  [place] [phone]

## 2018-02-09 NOTE — Telephone Encounter (Signed)
Caller name: Verlin Grills  Relation to pt: sister  Call back number:  [phone]   Reason for call:  Sister checking on the status of message below, please advise [place]ow,Joann [phone] [facility])

## 2018-02-10 NOTE — Telephone Encounter (Signed)
Left message that lyrica is best for her condition Discussed that the common side effects regarding lyrica include hot flashes, weight gain, and insomnia She should start with once a day at bedtime and then follow up with me if there are side effects If no side effects to increase to twice a day

## 2018-02-13 ENCOUNTER — Telehealth: Payer: Self-pay | Admitting: Family Medicine

## 2018-02-13 NOTE — Telephone Encounter (Signed)
Verbal orders given. [name] stated that pt is complaining of leg pain, which she has been evaluated for. Social work consult has been placed and home health aid will be bathing twice a week.
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
## 2018-02-13 NOTE — Telephone Encounter (Unsigned)
Copied from [place] [phone]. Topic: General - Other >> Feb 13, 2018  3:23 PM Carolyn Stare wrote:  Dwyane Dee with [facility] cal lto say  he will be seeing the pt 2 times a week for 3 weeks and saw her 02/11/18    [phone]

## 2018-02-15 ENCOUNTER — Encounter (HOSPITAL_COMMUNITY): Payer: Self-pay | Admitting: Emergency Medicine

## 2018-02-15 ENCOUNTER — Emergency Department (HOSPITAL_COMMUNITY): Payer: Medicare Other

## 2018-02-15 ENCOUNTER — Observation Stay (HOSPITAL_COMMUNITY)
Admission: EM | Admit: 2018-02-15 | Discharge: 2018-02-18 | Disposition: A | Discharge status: Skilled Nursing Facility | Payer: Medicare Other | Attending: Internal Medicine | Admitting: Internal Medicine

## 2018-02-15 DIAGNOSIS — E1151 Type 2 diabetes mellitus with diabetic peripheral angiopathy without gangrene: Secondary | ICD-10-CM | POA: Diagnosis not present

## 2018-02-15 DIAGNOSIS — Z87891 Personal history of nicotine dependence: Secondary | ICD-10-CM | POA: Insufficient documentation

## 2018-02-15 DIAGNOSIS — M1990 Unspecified osteoarthritis, unspecified site: Secondary | ICD-10-CM | POA: Diagnosis not present

## 2018-02-15 DIAGNOSIS — M25461 Effusion, right knee: Secondary | ICD-10-CM | POA: Diagnosis not present

## 2018-02-15 DIAGNOSIS — Z6831 Body mass index (BMI) 31.0-31.9, adult: Secondary | ICD-10-CM | POA: Insufficient documentation

## 2018-02-15 DIAGNOSIS — D509 Iron deficiency anemia, unspecified: Secondary | ICD-10-CM | POA: Diagnosis not present

## 2018-02-15 DIAGNOSIS — E11620 Type 2 diabetes mellitus with diabetic dermatitis: Secondary | ICD-10-CM

## 2018-02-15 DIAGNOSIS — Z91040 Latex allergy status: Secondary | ICD-10-CM | POA: Diagnosis not present

## 2018-02-15 DIAGNOSIS — Z880 Allergy status to penicillin: Secondary | ICD-10-CM | POA: Insufficient documentation

## 2018-02-15 DIAGNOSIS — E669 Obesity, unspecified: Secondary | ICD-10-CM | POA: Diagnosis not present

## 2018-02-15 DIAGNOSIS — R600 Localized edema: Secondary | ICD-10-CM | POA: Diagnosis present

## 2018-02-15 DIAGNOSIS — R609 Edema, unspecified: Secondary | ICD-10-CM | POA: Diagnosis present

## 2018-02-15 DIAGNOSIS — I10 Essential (primary) hypertension: Secondary | ICD-10-CM | POA: Diagnosis not present

## 2018-02-15 DIAGNOSIS — R4189 Other symptoms and signs involving cognitive functions and awareness: Secondary | ICD-10-CM

## 2018-02-15 DIAGNOSIS — I872 Venous insufficiency (chronic) (peripheral): Secondary | ICD-10-CM | POA: Diagnosis not present

## 2018-02-15 DIAGNOSIS — L03119 Cellulitis of unspecified part of limb: Secondary | ICD-10-CM | POA: Insufficient documentation

## 2018-02-15 LAB — CBC WITH DIFFERENTIAL/PLATELET
Abs Immature Granulocytes: 0 10*3/uL (ref 0.0–0.1)
Basophils Absolute: 0 10*3/uL (ref 0.0–0.1)
Basophils Relative: 0 %
EOS ABS: 0.1 10*3/uL (ref 0.0–0.7)
EOS PCT: 3 %
HEMATOCRIT: 38.7 % (ref 36.0–46.0)
Hemoglobin: 11.4 g/dL — ABNORMAL LOW (ref 12.0–15.0)
Immature Granulocytes: 0 %
LYMPHS ABS: 1.3 10*3/uL (ref 0.7–4.0)
Lymphocytes Relative: 24 %
MCH: 22.7 pg — ABNORMAL LOW (ref 26.0–34.0)
MCHC: 29.5 g/dL — AB (ref 30.0–36.0)
MCV: 76.9 fL — AB (ref 78.0–100.0)
MONOS PCT: 7 %
Monocytes Absolute: 0.4 10*3/uL (ref 0.1–1.0)
Neutro Abs: 3.4 10*3/uL (ref 1.7–7.7)
Neutrophils Relative %: 66 %
Platelets: 216 10*3/uL (ref 150–400)
RBC: 5.03 MIL/uL (ref 3.87–5.11)
RDW: 15.9 % — AB (ref 11.5–15.5)
WBC: 5.2 10*3/uL (ref 4.0–10.5)

## 2018-02-15 LAB — COMPREHENSIVE METABOLIC PANEL
ALK PHOS: 93 U/L (ref 38–126)
ALT: 12 U/L — AB (ref 14–54)
AST: 15 U/L (ref 15–41)
Albumin: 3.7 g/dL (ref 3.5–5.0)
Anion gap: 7 (ref 5–15)
BUN: 15 mg/dL (ref 6–20)
CALCIUM: 9 mg/dL (ref 8.9–10.3)
CHLORIDE: 109 mmol/L (ref 101–111)
CO2: 25 mmol/L (ref 22–32)
CREATININE: 0.77 mg/dL (ref 0.44–1.00)
GFR calc Af Amer: >60 mL/min (ref >60)
Glucose, Bld: 73 mg/dL (ref 65–99)
Potassium: 3.7 mmol/L (ref 3.5–5.1)
Sodium: 141 mmol/L (ref 135–145)
Total Bilirubin: 0.7 mg/dL (ref 0.3–1.2)
Total Protein: 8 g/dL (ref 6.5–8.1)

## 2018-02-15 LAB — I-STAT CG4 LACTIC ACID, ED: LACTIC ACID, VENOUS: 0.83 mmol/L (ref 0.5–1.9)

## 2018-02-15 LAB — BRAIN NATRIURETIC PEPTIDE: B Natriuretic Peptide: 502.3 pg/mL — ABNORMAL HIGH (ref 0.0–100.0)

## 2018-02-15 MED ORDER — OXYCODONE-ACETAMINOPHEN 5-325 MG PO TABS
1.0000 | ORAL_TABLET | Freq: Once | ORAL | Status: AC
  Administered 2018-02-15: 1 via ORAL
  Filled 2018-02-15: qty 1

## 2018-02-15 MED ORDER — CLINDAMYCIN PHOSPHATE 600 MG/50ML IV SOLN
600.0000 mg | Freq: Once | INTRAVENOUS | Status: AC
  Administered 2018-02-15: 600 mg via INTRAVENOUS
  Filled 2018-02-15: qty 50

## 2018-02-15 MED ORDER — FUROSEMIDE 10 MG/ML IJ SOLN
20.0000 mg | Freq: Once | INTRAMUSCULAR | Status: AC
  Administered 2018-02-15: 20 mg via INTRAVENOUS
  Filled 2018-02-15: qty 2

## 2018-02-15 NOTE — ED Provider Notes (Signed)
[place] EMERGENCY DEPARTMENT Provider Note   CSN: [id number] Arrival date & time: 02/15/18  1427     History   Chief Complaint Chief Complaint  Patient presents with  . Leg Swelling    HPI [name] is a 72 y.o. female with history of chronic venous stasis bilaterally, chronic right knee pain with osteoarthritis, diabetes, hypertension who presents with new blisters to her left foot as well as increase in pain and swelling to her right knee that occurred overnight.  She reports she normally has some sort of pain and swelling to her knee and limited range of motion, however it became much worse.  She denies any injury.  She denies any fevers at home.  She takes Vicodin and tramadol (1 or the other) as needed for pain at home.  She denies any chest pain, shortness of breath, abdominal pain, nausea, vomiting.  HPI  Past Medical History:  Diagnosis Date  . Acute pericarditis, unspecified   . Arthritis   . Arthritis   . Bilateral swelling of feet   . Cellulitis   . Chronic anemia   . Chronic knee pain    right  . Closed fracture of styloid process of ulna   . Cognitive decline   . Decreased appetite   . Diabetes mellitus    A1C has been normal for past 2 years and takes no diabetic meds at present  . Distal radius fracture 02/04/2016  . Distal radius fracture, left   . Distal radius fracture, left   . ECG abnormal   . Fall   . Fever   . Hypertension   . Hypertension   . Hypoglycemia   . Knee pain, chronic   . Left scapholunate ligament tear   . Lower extremity edema   . Malnutrition of moderate degree ([place])   . Mass of right side of neck   . Microcytic anemia   . Polio   . Polio   . Radius fracture   . Severe protein-calorie malnutrition ([place])   . Swelling of joint, knee, right   . Type 2 diabetes mellitus, controlled ([place])   . Venous insufficiency     Patient Active Problem List   Diagnosis Date Noted  . Physical deconditioning  04/04/2017  . Eczema 03/24/2017  . Decreased independence with activities of daily living 03/24/2017  . Toenail deformity 03/24/2017  . Decreased mobility and endurance 02/07/2017  . Cellulitis   . [place] scabies 12/18/2016  . Community acquired pneumonia of right lower lobe of lung ([place])   . Dehydration   . Failure to thrive in adult   . Impetigo   . Rash of entire body 12/06/2016  . Localized swelling, mass, or lump of lower extremity, bilateral 12/06/2016  . Constipation 03/24/2016  . Fall 02/04/2016  . Chronic anemia 02/04/2016  . Cognitive decline 02/04/2016  . Cellulitis of right arm 01/19/2016  . Swelling of joint, knee, right 10/19/2015  . Venous insufficiency of both lower extremities, chronic 10/19/2015  . Bilateral lower extremity edema 05/24/2015  . Type 2 diabetes mellitus, controlled ([place]) 05/24/2015  . Malnutrition of moderate degree ([place]) 06/07/2014  . Acute pericarditis, unspecified 06/07/2014  . Hypoglycemia 06/06/2014  . Fever 06/06/2014  . Knee pain, chronic 06/06/2014  . Mass of right side of neck 06/06/2014  . Severe protein-calorie malnutrition ([place]) 06/06/2014  . Microcytic anemia 06/06/2014    Past Surgical History:  Procedure Laterality Date  . ABDOMINAL HYSTERECTOMY    .  OPEN REDUCTION INTERNAL FIXATION (ORIF) DISTAL RADIAL FRACTURE Left 02/29/2016   Procedure: LEFT DISTAL RADIUS REPAIR OF MALUNION WITH OPEN REDUCTION INTERNAL FIXATION (ORIF);  Surgeon: Iran Planas, MD;  Location: [facility];  Service: Orthopedics;  Laterality: Left;     OB History   None      Home Medications    Prior to Admission medications   Medication Sig Start Date End Date Taking? Authorizing Provider  furosemide (LASIX) 20 MG tablet Take 1 tablet (20 mg total) by mouth daily. Patient taking differently: Take 20 mg by mouth daily as needed for fluid.  01/19/18  Yes Forrest Moron, MD  HYDROcodone-acetaminophen (NORCO/VICODIN) 5-325 MG tablet Take 1-2 tablets by mouth  every 6 (six) hours as needed for severe pain. 05/04/17  Yes Robinson, [place] N, PA-C  pregabalin (LYRICA) 50 MG capsule Take 1 capsule (50 mg total) by mouth 2 (two) times daily. 01/19/18  Yes Forrest Moron, MD  traMADol (ULTRAM) 50 MG tablet Take 50 mg by mouth every 6 (six) hours as needed for moderate pain.   Yes [provider]  mupirocin ointment (BACTROBAN) 2 % Apply 1 application topically 2 (two) times daily. Patient not taking: Reported on 02/15/2018 07/05/17   Arturo Morton    Family History Family History  Problem Relation Age of Onset  . Ovarian cancer Mother   . COPD Father   . Hypertension Unknown   . High Cholesterol Unknown   . Diabetes Mellitus II Neg Hx   . Lupus Neg Hx   . Sarcoidosis Neg Hx     Social History Social History   Tobacco Use  . Smoking status: Former Smoker    Packs/day: 0.50    Types: Cigarettes    Last attempt to quit: 09/09/1968    Years since quitting: 49.4  . Smokeless tobacco: Never Used  Substance Use Topics  . Alcohol use: No  . Drug use: No     Allergies   Orange concentrate [flavoring agent]; Peach [prunus persica]; Penicillins; Strawberry extract; Adhesive [tape]; Aspirin; Latex; Other; Pineapple; and Strawberry flavor   Review of Systems Review of Systems  Constitutional: Negative for chills and fever.  HENT: Negative for facial swelling and sore throat.   Respiratory: Negative for shortness of breath.   Cardiovascular: Negative for chest pain.  Gastrointestinal: Negative for abdominal pain, nausea and vomiting.  Genitourinary: Negative for dysuria.  Musculoskeletal: Positive for arthralgias and joint swelling. Negative for back pain.  Skin: Positive for rash and wound.  Neurological: Negative for headaches.  Psychiatric/Behavioral: The patient is not nervous/anxious.      Physical Exam Updated Vital Signs BP 123/84   Pulse 82   Temp 98.1 F (36.7 C) (Oral)   Resp 18   Ht 5\' 7"  (1.702 m)   Wt 95.3 kg  (210 lb)   SpO2 98%   BMI 32.89 kg/m   Physical Exam  Constitutional: She appears well-developed and well-nourished. No distress.  HENT:  Head: Normocephalic and atraumatic.  Mouth/Throat: Oropharynx is clear and moist. No oropharyngeal exudate.  Eyes: Pupils are equal, round, and reactive to light. Conjunctivae are normal. Right eye exhibits no discharge. Left eye exhibits no discharge. No scleral icterus.  Neck: Normal range of motion. Neck supple. No thyromegaly present.  Chronic, right-sided neck mass  Cardiovascular: Normal rate, regular rhythm, normal heart sounds and intact distal pulses. Exam reveals no gallop and no friction rub.  No murmur heard. Lower extremity pulses confirmed with Doppler bilaterally  Pulmonary/Chest: Effort normal and breath sounds normal. No stridor. No respiratory distress. She has no wheezes. She has no rales.  Abdominal: Soft. Bowel sounds are normal. She exhibits no distension. There is no tenderness. There is no rebound and no guarding.  Musculoskeletal: She exhibits edema and tenderness.  Significant edema to the right knee, range of motion very limited due to pain and swelling, no warmth or erythema Darkening of lower legs bilaterally with tenderness, blistering with clear fluid noted to the left dorsal foot with tenderness to the left knee  Lymphadenopathy:    She has no cervical adenopathy.  Neurological: She is alert. Coordination normal.  Skin: Skin is warm and dry. No rash noted. She is not diaphoretic. No pallor.  Psychiatric: She has a normal mood and affect.  Nursing note and vitals reviewed.    ED Treatments / Results  Labs (all labs ordered are listed, but only abnormal results are displayed) Labs Reviewed  COMPREHENSIVE METABOLIC PANEL - Abnormal; Notable for the following components:      Result Value   ALT 12 (*)    All other components within normal limits  CBC WITH DIFFERENTIAL/PLATELET - Abnormal; Notable for the following  components:   Hemoglobin 11.4 (*)    MCV 76.9 (*)    MCH 22.7 (*)    MCHC 29.5 (*)    RDW 15.9 (*)    All other components within normal limits  BRAIN NATRIURETIC PEPTIDE - Abnormal; Notable for the following components:   B Natriuretic Peptide 502.3 (*)    All other components within normal limits  I-STAT CG4 LACTIC ACID, ED    EKG None  Radiology Dg Chest Portable 1 View  Result Date: 02/16/2018 CLINICAL DATA:  Bilateral lower extremity edema. Concern for fluid overload. EXAM: PORTABLE CHEST 1 VIEW COMPARISON:  Radiographs 03/02/2017, 12/17/2016 FINDINGS: Similar mild cardiomegaly and tortuous aorta. Atherosclerosis of the aortic arch. No pulmonary edema. Minimal blunting of right costophrenic angle appears chronic and unchanged from prior exam, no definite pleural effusion. No focal airspace disease. No pneumothorax. Prominence of the right paratracheal stripe likely due to patient rotation and present on prior exam. Multilevel degenerative change in the spine. Degenerative change of both shoulders. IMPRESSION: 1. No pulmonary edema or evidence of fluid overload in the chest. 2. Chronic cardiomegaly.  Probable right basilar scarring. Electronically Signed   By: Jeb Levering M.D.   On: 02/16/2018 00:31   Dg Knee Complete 4 Views Right  Result Date: 02/15/2018 CLINICAL DATA:  Significant increase in RIGHT knee swelling and pain, chronic leg edema and venous insufficiency EXAM: RIGHT KNEE - COMPLETE 4+ VIEW COMPARISON:  06/06/2014 FINDINGS: Marked osseous demineralization. Significant degenerative changes of the RIGHT knee with joint space narrowing and spur formation greatest at lateral compartment where bone-on-bone appearance is seen. Bulky patellofemoral spur formation as well. Observed degenerative changes are progressive since prior exam. No acute fracture or dislocation. No knee joint effusion. Diffuse soft tissue swelling RIGHT leg. IMPRESSION: Diffuse soft tissue swelling RIGHT  leg. Osseous demineralization with advanced degenerative changes of the RIGHT knee, progressive since 2015. Electronically Signed   By: Lavonia Dana M.D.   On: 02/15/2018 16:47   Dg Foot Complete Left  Result Date: 02/15/2018 CLINICAL DATA:  Significant increase in swelling with blistering and drainage LEFT foot EXAM: LEFT FOOT - COMPLETE 3+ VIEW COMPARISON:  02/11/2013 FINDINGS: Diffuse osseous demineralization. Diffuse soft tissue swelling. Joint spaces preserved. No acute fracture, dislocation, or bone destruction. Plantar and Achilles insertion  calcaneal spur formation. IMPRESSION: Osseous demineralization and calcaneal spur formation. Significant soft tissue swelling without acute bony abnormalities. Electronically Signed   By: Lavonia Dana M.D.   On: 02/15/2018 16:48    Procedures Procedures (including critical care time)  Medications Ordered in ED Medications  oxyCODONE-acetaminophen (PERCOCET/ROXICET) 5-325 MG per tablet 1 tablet (1 tablet Oral Given 02/15/18 1828)  clindamycin (CLEOCIN) IVPB 600 mg (0 mg Intravenous Stopped 02/15/18 2151)  furosemide (LASIX) injection 20 mg (20 mg Intravenous Given 02/15/18 2118)     Initial Impression / Assessment and Plan / ED Course  I have reviewed the triage vital signs and the nursing notes.  Pertinent labs & imaging results that were available during my care of the patient were reviewed by me and considered in my medical decision making (see chart for details).     Patient with acute on chronic leg swelling with new pain and blistering.  Suspect associated cellulitis.  No fluid overload seen on chest x-ray.  CBC and CMP are stable.  Symptoms improved with Lasix in the ED.  Patient given first dose of clindamycin in the ED.  Venous ultrasound is pending for the morning to rule out DVT, although patient states she has had worsening swelling in the right lower extremity for a while.  I discussed patient case with Dr. Hal Hope with [facility] hospitalist who  will admit the patient for further management and observation.  I appreciate his assistance with the patient.  Patient also evaluated by Dr. Jeanell Sparrow who guided the patient's management and agrees with plan.  Final Clinical Impressions(s) / ED Diagnoses   Final diagnoses:  Peripheral edema  Cellulitis of lower extremity, unspecified laterality    ED Discharge Orders    None       Frederica Kuster, PA-C 02/16/18 0125    Pattricia Boss, MD 02/16/18 1416

## 2018-02-15 NOTE — ED Notes (Signed)
Bilateral bounding pedal pulses noted by ultrasound.

## 2018-02-15 NOTE — ED Notes (Signed)
Pt using a Purewick 

## 2018-02-15 NOTE — ED Triage Notes (Signed)
Pt arrives to ED for evaluation of bilateral lower extremity edema. Pt has hx of chronic leg edema with venous insufficiency. Pt has new changes to this including blistering to her left foot with some drainage to the toe and edema has spread to her right knee with significant swelling noted to the right knee. Unable to palpate a pedal pulse to right foot.

## 2018-02-15 NOTE — ED Notes (Signed)
Pt reports no pain and states she is feeling much better.

## 2018-02-16 ENCOUNTER — Ambulatory Visit (HOSPITAL_BASED_OUTPATIENT_CLINIC_OR_DEPARTMENT_OTHER): Payer: Medicare Other

## 2018-02-16 ENCOUNTER — Encounter (HOSPITAL_COMMUNITY): Payer: Self-pay | Admitting: Internal Medicine

## 2018-02-16 ENCOUNTER — Other Ambulatory Visit: Payer: Self-pay

## 2018-02-16 ENCOUNTER — Observation Stay (HOSPITAL_BASED_OUTPATIENT_CLINIC_OR_DEPARTMENT_OTHER): Payer: Medicare Other

## 2018-02-16 DIAGNOSIS — R600 Localized edema: Secondary | ICD-10-CM

## 2018-02-16 DIAGNOSIS — R609 Edema, unspecified: Secondary | ICD-10-CM

## 2018-02-16 DIAGNOSIS — I371 Nonrheumatic pulmonary valve insufficiency: Secondary | ICD-10-CM | POA: Diagnosis not present

## 2018-02-16 DIAGNOSIS — I361 Nonrheumatic tricuspid (valve) insufficiency: Secondary | ICD-10-CM

## 2018-02-16 LAB — SEDIMENTATION RATE: SED RATE: 25 mm/h — AB (ref 0–22)

## 2018-02-16 LAB — HEPATIC FUNCTION PANEL
ALBUMIN: 2.9 g/dL — AB (ref 3.5–5.0)
ALT: 10 U/L — ABNORMAL LOW (ref 14–54)
AST: 12 U/L — ABNORMAL LOW (ref 15–41)
Alkaline Phosphatase: 73 U/L (ref 38–126)
Bilirubin, Direct: 0.1 mg/dL (ref 0.1–0.5)
Indirect Bilirubin: 0.6 mg/dL (ref 0.3–0.9)
TOTAL PROTEIN: 6.3 g/dL — AB (ref 6.5–8.1)
Total Bilirubin: 0.7 mg/dL (ref 0.3–1.2)

## 2018-02-16 LAB — GLUCOSE, CAPILLARY
GLUCOSE-CAPILLARY: 77 mg/dL (ref 65–99)
GLUCOSE-CAPILLARY: 119 mg/dL — AB (ref 65–99)
GLUCOSE-CAPILLARY: 72 mg/dL (ref 65–99)
GLUCOSE-CAPILLARY: 131 mg/dL — AB (ref 65–99)
Glucose-Capillary: 66 mg/dL (ref 65–99)
Glucose-Capillary: 83 mg/dL (ref 65–99)

## 2018-02-16 LAB — CBC
HCT: 31.5 % — ABNORMAL LOW (ref 36.0–46.0)
Hemoglobin: 9.3 g/dL — ABNORMAL LOW (ref 12.0–15.0)
MCH: 22.1 pg — ABNORMAL LOW (ref 26.0–34.0)
MCHC: 29.5 g/dL — AB (ref 30.0–36.0)
MCV: 75 fL — ABNORMAL LOW (ref 78.0–100.0)
Platelets: 190 10*3/uL (ref 150–400)
RBC: 4.2 MIL/uL (ref 3.87–5.11)
RDW: 15.3 % (ref 11.5–15.5)
WBC: 5 10*3/uL (ref 4.0–10.5)

## 2018-02-16 LAB — MRSA PCR SCREENING: MRSA BY PCR: NEGATIVE

## 2018-02-16 LAB — ECHOCARDIOGRAM COMPLETE
Height: 67 in
Weight: 3262.4 oz

## 2018-02-16 LAB — BASIC METABOLIC PANEL
Anion gap: 7 (ref 5–15)
BUN: 16 mg/dL (ref 6–20)
CALCIUM: 8.7 mg/dL — AB (ref 8.9–10.3)
CO2: 28 mmol/L (ref 22–32)
Chloride: 107 mmol/L (ref 101–111)
Creatinine, Ser: 0.85 mg/dL (ref 0.44–1.00)
GFR calc Af Amer: >60 mL/min (ref >60)
GLUCOSE: 72 mg/dL (ref 65–99)
POTASSIUM: 3.7 mmol/L (ref 3.5–5.1)
SODIUM: 142 mmol/L (ref 135–145)

## 2018-02-16 LAB — URIC ACID: URIC ACID, SERUM: 4.8 mg/dL (ref 2.3–6.6)

## 2018-02-16 MED ORDER — ACETAMINOPHEN 325 MG PO TABS: 650.0000 mg | ORAL_TABLET | Freq: Four times a day (QID) | ORAL | Status: DC | PRN

## 2018-02-16 MED ORDER — HYDROCODONE-ACETAMINOPHEN 5-325 MG PO TABS
1.0000 | ORAL_TABLET | Freq: Four times a day (QID) | ORAL | Status: DC | PRN
  Administered 2018-02-16: 1 via ORAL
  Filled 2018-02-16: qty 1

## 2018-02-16 MED ORDER — FUROSEMIDE 10 MG/ML IJ SOLN
20.0000 mg | Freq: Two times a day (BID) | INTRAMUSCULAR | Status: DC
  Administered 2018-02-16 – 2018-02-18 (×5): 20 mg via INTRAVENOUS
  Filled 2018-02-16 (×5): qty 2

## 2018-02-16 MED ORDER — ACETAMINOPHEN 650 MG RE SUPP: 650.0000 mg | Freq: Four times a day (QID) | RECTAL | Status: DC | PRN

## 2018-02-16 MED ORDER — PERFLUTREN LIPID MICROSPHERE
1.0000 mL | INTRAVENOUS | Status: DC | PRN
Start: 2018-02-16 — End: 2018-02-16
  Filled 2018-02-16: qty 10

## 2018-02-16 MED ORDER — SODIUM CHLORIDE 0.9 % IV SOLN
100.0000 mg | Freq: Two times a day (BID) | INTRAVENOUS | Status: DC
  Administered 2018-02-16: 100 mg via INTRAVENOUS
  Filled 2018-02-16 (×2): qty 100

## 2018-02-16 MED ORDER — HYDROCODONE-ACETAMINOPHEN 5-325 MG PO TABS: 1.0000 | ORAL_TABLET | Freq: Four times a day (QID) | ORAL | Status: DC | PRN

## 2018-02-16 MED ORDER — PREGABALIN 25 MG PO CAPS
50.0000 mg | ORAL_CAPSULE | Freq: Two times a day (BID) | ORAL | Status: DC
  Administered 2018-02-16 – 2018-02-18 (×6): 50 mg via ORAL
  Filled 2018-02-16 (×6): qty 2

## 2018-02-16 MED ORDER — ONDANSETRON HCL 4 MG/2ML IJ SOLN: 4.0000 mg | Freq: Four times a day (QID) | INTRAMUSCULAR | Status: DC | PRN

## 2018-02-16 MED ORDER — ONDANSETRON HCL 4 MG PO TABS: 4.0000 mg | ORAL_TABLET | Freq: Four times a day (QID) | ORAL | Status: DC | PRN

## 2018-02-16 MED ORDER — INSULIN ASPART 100 UNIT/ML ~~LOC~~ SOLN
0.0000 [IU] | Freq: Three times a day (TID) | SUBCUTANEOUS | Status: DC
  Administered 2018-02-17: 2 [IU] via SUBCUTANEOUS

## 2018-02-16 MED ORDER — TRAMADOL HCL 50 MG PO TABS: 50.0000 mg | ORAL_TABLET | Freq: Four times a day (QID) | ORAL | Status: DC | PRN

## 2018-02-16 MED ORDER — ENOXAPARIN SODIUM 40 MG/0.4ML ~~LOC~~ SOLN
40.0000 mg | Freq: Every day | SUBCUTANEOUS | Status: DC
  Administered 2018-02-16 – 2018-02-18 (×3): 40 mg via SUBCUTANEOUS
  Filled 2018-02-16 (×3): qty 0.4

## 2018-02-16 NOTE — Evaluation (Signed)
Physical Therapy Evaluation Patient Details Name: [name] MRN: [id number] DOB: Aug 02, 1946 Today's Date: 02/16/2018   History of Present Illness  [name] is a 72 y.o. female with history of chronic venous stasis dermatitis and edema with previous history of diabetes mellitus off medications for last 6 months presents to the ER because of worsening swelling of the lower extremities.  Clinical Impression  Pt admitted with above. Pt very delayed in response time and slow to move. Blood sugar checked, at 66. Pt given orange juice. Pt able to transfer to [facility] and then amb 10' to chair but required minA for safety to prevent fall. Pt requiring modA to stand up safely due to R knee stuck in extension. Pt very poor historian and noted impaired memory as well. Pt unsafe to be home alone due to high falls risk, pt had recent fall 4 weeks ago. Acute PT to cont. To follow.    Follow Up Recommendations SNF;Supervision/Assistance - 24 hour    Equipment Recommendations  None recommended by PT(TBD by next venue)    Recommendations for Other Services       Precautions / Restrictions Precautions Precautions: Fall Precaution Comments: impaired cognition, R knee stuck in extension Restrictions Weight Bearing Restrictions: No      Mobility  Bed Mobility Overal bed mobility: Needs Assistance Bed Mobility: Supine to Sit     Supine to sit: HOB elevated;Min guard     General bed mobility comments: significant increase in time, HOB elevated and definite use of bed rail   Transfers Overall transfer level: Needs assistance   Transfers: Sit to/from Stand;Stand Pivot Transfers Sit to Stand: Mod assist;From elevated surface Stand pivot transfers: Min assist       General transfer comment: pt with increased difficulty powering up due to inability to bend R Knee. modA to power up and steady on bilat feet prior to completing stand pivot. pt very slow during transfer, verbal cues for  safety and sequencing  Ambulation/Gait Ambulation/Gait assistance: Min guard Ambulation Distance (Feet): 10 Feet Assistive device: Rolling walker (2 wheeled) Gait Pattern/deviations: Step-through pattern;Decreased stride length;Narrow base of support;Trunk flexed Gait velocity: very slow Gait velocity interpretation: <1.8 ft/sec, indicate of risk for recurrent falls General Gait Details: due to weakness transitioned pt to RW insteady of cane. pt unable to place L foot flat on floor and with no R knee bending. pt with very short step length and height  Stairs            Wheelchair Mobility    Modified Rankin (Stroke Patients Only)       Balance Overall balance assessment: Needs assistance Sitting-balance support: No upper extremity supported;Feet supported Sitting balance-Leahy Scale: Good     Standing balance support: Single extremity supported Standing balance-Leahy Scale: Poor Standing balance comment: needs support in standing to perform peri-care s/p urination                             Pertinent Vitals/Pain Pain Assessment: Faces Faces Pain Scale: Hurts little more Pain Location: bilat LEs Pain Descriptors / Indicators: Grimacing Pain Intervention(s): Monitored during session    Home Living Family/patient expects to be discharged to:: Skilled nursing facility Living Arrangements: Other relatives               Additional Comments: pt poor historian but reports she lives with sister who works during the day. pt states she has a tub shower in which  her sister helps her get in then helps with bathing her back side. Pt states there is 2 STE    Prior Function Level of Independence: Needs assistance   Gait / Transfers Assistance Needed: reports she walks with a cane  ADL's / Homemaking Assistance Needed: assist for bathing and dressing  Comments: pt is a poor historian and inconsistent in report. pt states she hasn't fallen in over a year but  the chart states she fell 4 weeks ago.      Hand Dominance   Dominant Hand: Right    Extremity/Trunk Assessment   Upper Extremity Assessment Upper Extremity Assessment: Generalized weakness    Lower Extremity Assessment Lower Extremity Assessment: RLE deficits/detail;LLE deficits/detail RLE Deficits / Details: knee stuck in extension and very swollen, can move hip and ankle but no active flexion at knee LLE Deficits / Details: knee flexion limited by swelling and approximating tissue    Cervical / Trunk Assessment Cervical / Trunk Assessment: Normal  Communication   Communication: No difficulties  Cognition Arousal/Alertness: Awake/alert Behavior During Therapy: Flat affect Overall Cognitive Status: No family/caregiver present to determine baseline cognitive functioning Area of Impairment: Safety/judgement;Problem solving                         Safety/Judgement: Decreased awareness of safety   Problem Solving: Slow processing;Decreased initiation;Difficulty sequencing;Requires verbal cues;Requires tactile cues General Comments: pt very slow moving, pt second guessing and changing responses to questions frequently      General Comments General comments (skin integrity, edema, etc.): assisted pt to the [facility], pt able to perform pericare with set up and min guard    Exercises     Assessment/Plan    PT Assessment Patient needs continued PT services  PT Problem List Decreased strength;Decreased range of motion;Decreased activity tolerance;Decreased balance;Decreased mobility;Decreased coordination;Decreased cognition;Decreased knowledge of use of DME;Decreased safety awareness;Decreased knowledge of precautions       PT Treatment Interventions DME instruction;Gait training;Stair training;Functional mobility training;Therapeutic activities;Therapeutic exercise;Balance training;Neuromuscular re-education;Cognitive remediation;Patient/family education    PT Goals  (Current goals can be found in the Care Plan section)  Acute Rehab PT Goals Patient Stated Goal: decrease the swelling in my legs PT Goal Formulation: With patient Time For Goal Achievement: 02/23/18 Potential to Achieve Goals: Good    Frequency Min 2X/week   Barriers to discharge Decreased caregiver support sister works during the day    Co-evaluation               AM-PAC PT "6 Clicks" Daily Activity  Outcome Measure Difficulty turning over in bed (including adjusting bedclothes, sheets and blankets)?: Unable Difficulty moving from lying on back to sitting on the side of the bed? : Unable Difficulty sitting down on and standing up from a chair with arms (e.g., wheelchair, bedside commode, etc,.)?: Unable Help needed moving to and from a bed to chair (including a wheelchair)?: A Little Help needed walking in hospital room?: A Little Help needed climbing 3-5 steps with a railing? : A Lot 6 Click Score: 11    End of Session Equipment Utilized During Treatment: Gait belt Activity Tolerance: Patient tolerated treatment well Patient left: in chair;with call bell/phone within reach Nurse Communication: Mobility status PT Visit Diagnosis: Unsteadiness on feet (R26.81);Repeated falls (R29.6)    Time: 0725-0750 PT Time Calculation (min) (ACUTE ONLY): 25 min   Charges:   PT Evaluation $PT Eval Moderate Complexity: 1 Mod PT Treatments $Gait Training: 8-22 mins   PT G  Codes:        Kittie Plater, PT, DPT Pager #: [phone] Office #: [phone]   [place] 02/16/2018, 10:25 AM

## 2018-02-16 NOTE — Plan of Care (Signed)
  Problem: Education: Goal: Knowledge of General Education information will improve Outcome: Progressing   Problem: Health Behavior/Discharge Planning: Goal: Ability to manage health-related needs will improve Outcome: Progressing   Problem: Clinical Measurements: Goal: Ability to maintain clinical measurements within normal limits will improve Outcome: Progressing   Problem: Activity: Goal: Risk for activity intolerance will decrease Outcome: Progressing   Problem: Coping: Goal: Level of anxiety will decrease Outcome: Progressing   Problem: Pain Managment: Goal: General experience of comfort will improve Outcome: Progressing   Problem: Safety: Goal: Ability to remain free from injury will improve Outcome: Progressing   Problem: Skin Integrity: Goal: Risk for impaired skin integrity will decrease Outcome: Progressing

## 2018-02-16 NOTE — Progress Notes (Signed)
Hypoglycemic Event  CBG: 66  Treatment: 15 GM carbohydrate snack  Symptoms: None  Follow-up CBG: Time: 0800 CBG Result: 83 Possible Reasons for Event: Inadequate meal intake  Comments/MD notified: yes; no new orders    Ottie Glazier

## 2018-02-16 NOTE — Progress Notes (Signed)
Secretary placed order for xeroform. RN will apply dressing once it arrives.

## 2018-02-16 NOTE — Consult Note (Signed)
[place] Nurse wound consult note Reason for Consult: Consult requested for bilat legs.  There is genealized edema, some leaking, and scattered clear fluid filled blisters to left foot.  No open wounds. Dressing procedure/placement/frequency: Xeroform gauze to blistered areas, then ABD pads to absorb drainage and kerlex and ace wrap to provide light compression.  Discussed plan of care with patient.  She will probably only need leg wraps while in the hospital until edema is resolved, not on a long term basis. Please re-consult if further assistance is needed.  Thank-you,  Julien Girt MSN, [place], [place], [place], [place]

## 2018-02-16 NOTE — Progress Notes (Signed)
Patient admitted after midnight, please see H&P.  LE dopplers negative for DVT.  Echo pending.  Elevate LE, IV lasix, strict I/Os and daily weights.  Doubt cellulitis- no fever, no WBC count-- will d/c doxy and monitor. Eulogio Bear DO

## 2018-02-16 NOTE — Progress Notes (Signed)
   02/16/18 1300  Clinical Encounter Type  Visited With Patient  Visit Type Follow-up  Referral From Nurse  Consult/Referral To [place] visited with PT but the PT at the time was sleeping.  It looked like PT had just finished eating and was taking a nap.  Chaplain left the AD paperwork in the room and will come back later to finish if desired from PT.

## 2018-02-16 NOTE — Progress Notes (Signed)
Secretary ordered kerlix for pt's dressing.

## 2018-02-16 NOTE — Progress Notes (Signed)
  Echocardiogram 2D Echocardiogram has been performed.  [name] M 02/16/2018, 3:04 PM

## 2018-02-16 NOTE — Progress Notes (Deleted)
RN attempted to disimpact pt per MD order. RN disimpacted a small amount of feces. Pt states to apply lube and he will attempt to have a bowel movement again.

## 2018-02-16 NOTE — Progress Notes (Signed)
Patient arrived with ED nurse tech, patient stable- VSS, complaining of 7/70 BLE pain. Safety precautions in place, patient updated on plan of care.   CCMD called, telemetry verified.

## 2018-02-16 NOTE — Progress Notes (Signed)
Bilateral lower extremity venous duplex completed - There is no evidence of DVT, superficial thrombosis, or Baker's cyst. Toma Copier, RVS 02/16/2018 11:58 AM

## 2018-02-16 NOTE — ED Notes (Signed)
Pt sister Mignon Pine would like an update and would like a call when pt is discharged [phone]  Pt other sister Gilman Buttner [phone]

## 2018-02-16 NOTE — Clinical Social Work Note (Addendum)
CSW went by room to discuss SNF with patient but patient was not in room. CSW came by later and patient was asleep. CSW called out patient's name multiple times but patient would not wake up. CSW will try again later.  Dayton Scrape, [place]  3:40 pm CSW went by room to see patient again. Unable to wake her. DSS is on the unit to see patient. They did not give reason as to why. RN notified.  Dayton Scrape, [place] [phone]  4:32 pm Per DSS worker, there was a recent APS report made. Patient was sleeping and they did not wake her. Assigned Education officer, museum will follow up with her.  Dayton Scrape, [place]

## 2018-02-16 NOTE — H&P (Signed)
History and Physical    [name]:[id number] DOB: [birth year] DOA: 02/15/2018  PCP: Forrest Moron, MD  Patient coming from: Home.  Chief Complaint: Bilateral lower extremity edema.  HPI: [name] is a 72 y.o. female with history of chronic venous stasis dermatitis and edema with previous history of diabetes mellitus off medications for last 6 months presents to the ER because of worsening swelling of the lower extremities.  Patient is a poor historian.  Patient states over the last few weeks patient's lower extremity edema has worsened with increasing swelling and seeping of fluid.  Denies any fever chills.  Patient states she did fall 4 weeks ago after which patient's right knee swelling also has worsened.  Patient does have a history of chronic right knee swelling.  ED Course: In the ER patient is afebrile on exam patient has bilateral lower extremity edema and has difficulty moving because of the edema.  Patient takes Lasix 20 mg daily p.o.  Was given 1 dose of IV Lasix admitted for further observation and will need physical therapy and wound team consult.  We will also check Dopplers.  X-rays did not show anything acute except for soft tissue swelling.  Review of Systems: As per HPI, rest all negative.   Past Medical History:  Diagnosis Date  . Acute pericarditis, unspecified   . Arthritis   . Arthritis   . Bilateral swelling of feet   . Cellulitis   . Chronic anemia   . Chronic knee pain    right  . Closed fracture of styloid process of ulna   . Cognitive decline   . Decreased appetite   . Diabetes mellitus    A1C has been normal for past 2 years and takes no diabetic meds at present  . Distal radius fracture 02/04/2016  . Distal radius fracture, left   . Distal radius fracture, left   . ECG abnormal   . Fall   . Fever   . Hypertension   . Hypertension   . Hypoglycemia   . Knee pain, chronic   . Left scapholunate ligament tear   . Lower extremity  edema   . Malnutrition of moderate degree ([place])   . Mass of right side of neck   . Microcytic anemia   . Polio   . Polio   . Radius fracture   . Severe protein-calorie malnutrition ([place])   . Swelling of joint, knee, right   . Type 2 diabetes mellitus, controlled ([place])   . Venous insufficiency     Past Surgical History:  Procedure Laterality Date  . ABDOMINAL HYSTERECTOMY    . OPEN REDUCTION INTERNAL FIXATION (ORIF) DISTAL RADIAL FRACTURE Left 02/29/2016   Procedure: LEFT DISTAL RADIUS REPAIR OF MALUNION WITH OPEN REDUCTION INTERNAL FIXATION (ORIF);  Surgeon: Iran Planas, MD;  Location: [facility];  Service: Orthopedics;  Laterality: Left;     reports that she quit smoking about 49 years ago. Her smoking use included cigarettes. She smoked 0.50 packs per day. She has never used smokeless tobacco. She reports that she does not drink alcohol or use drugs.  Allergies  Allergen Reactions  . Orange Concentrate [Flavoring Agent] Shortness Of Breath, Itching and Swelling  . Peach [Prunus Persica] Shortness Of Breath, Itching and Swelling  . Penicillins Anaphylaxis and Hives    All 'cillins' Has patient had a PCN reaction causing immediate rash, facial/tongue/throat swelling, SOB or lightheadedness with hypotension: Yes Has patient had a PCN reaction causing severe  rash involving mucus membranes or skin necrosis: No Has patient had a PCN reaction that required hospitalization No Has patient had a PCN reaction occurring within the last 10 years: No If all of the above answers are "NO", then may proceed with Cephalosporin use.   . Strawberry Extract Shortness Of Breath, Itching and Swelling  . Adhesive [Tape] Itching and Rash    Please use "paper" tape  . Aspirin Itching  . Latex Itching and Rash  . Other Hives, Itching and Rash    NO "-CILLINS"  . Pineapple Itching and Rash  . Strawberry Flavor Itching and Rash    Family History  Problem Relation Age of Onset  . Ovarian cancer Mother     . COPD Father   . Hypertension Unknown   . High Cholesterol Unknown   . Diabetes Mellitus II Neg Hx   . Lupus Neg Hx   . Sarcoidosis Neg Hx     Prior to Admission medications   Medication Sig Start Date End Date Taking? Authorizing Provider  furosemide (LASIX) 20 MG tablet Take 1 tablet (20 mg total) by mouth daily. Patient taking differently: Take 20 mg by mouth daily as needed for fluid.  01/19/18  Yes Forrest Moron, MD  HYDROcodone-acetaminophen (NORCO/VICODIN) 5-325 MG tablet Take 1-2 tablets by mouth every 6 (six) hours as needed for severe pain. 05/04/17  Yes Robinson, [place] N, PA-C  pregabalin (LYRICA) 50 MG capsule Take 1 capsule (50 mg total) by mouth 2 (two) times daily. 01/19/18  Yes Forrest Moron, MD  traMADol (ULTRAM) 50 MG tablet Take 50 mg by mouth every 6 (six) hours as needed for moderate pain.   Yes [provider]  mupirocin ointment (BACTROBAN) 2 % Apply 1 application topically 2 (two) times daily. Patient not taking: Reported on 02/15/2018 07/05/17   Ok Edwards, PA-C    Physical Exam: Vitals:   02/16/18 0100 02/16/18 0115 02/16/18 0130 02/16/18 0145  BP: 127/73 129/67 131/68 129/73  Pulse: 77 80 79 82  Resp:    18  Temp:      TempSrc:      SpO2: 98% 97% 95% 97%  Weight:      Height:          Constitutional: Moderately built and nourished. Vitals:   02/16/18 0100 02/16/18 0115 02/16/18 0130 02/16/18 0145  BP: 127/73 129/67 131/68 129/73  Pulse: 77 80 79 82  Resp:    18  Temp:      TempSrc:      SpO2: 98% 97% 95% 97%  Weight:      Height:       Eyes: Anicteric no pallor. ENMT: No discharge from the ears eyes nose or mouth. Neck: No JVD appreciated no mass felt. Respiratory: No rhonchi or crepitations. Cardiovascular: S1-S2 heard no murmurs appreciated. Abdomen: Soft nontender bowel sounds present. Musculoskeletal: Bilateral lower extremity edema extending up to the knee and right knee is also swollen. Skin: Chronic venous stasis  dermatitis in both lower extremity. Neurologic: Alert awake oriented to time place and person.  Moves all extremities. Psychiatric: Appears normal.  Normal affect.   Labs on Admission: I have personally reviewed following labs and imaging studies  CBC: Recent Labs  Lab 02/15/18 1528  WBC 5.2  NEUTROABS 3.4  HGB 11.4*  HCT 38.7  MCV 76.9*  PLT 564   Basic Metabolic Panel: Recent Labs  Lab 02/15/18 1528  NA 141  K 3.7  CL 109  CO2 25  GLUCOSE 73  BUN 15  CREATININE 0.77  CALCIUM 9.0   GFR: Estimated Creatinine Clearance: 73.1 mL/min (by C-G formula based on SCr of 0.77 mg/dL). Liver Function Tests: Recent Labs  Lab 02/15/18 1528  AST 15  ALT 12*  ALKPHOS 93  BILITOT 0.7  PROT 8.0  ALBUMIN 3.7   No results for input(s): LIPASE, AMYLASE in the last 168 hours. No results for input(s): AMMONIA in the last 168 hours. Coagulation Profile: No results for input(s): INR, PROTIME in the last 168 hours. Cardiac Enzymes: No results for input(s): CKTOTAL, CKMB, CKMBINDEX, TROPONINI in the last 168 hours. BNP (last 3 results) No results for input(s): PROBNP in the last 8760 hours. HbA1C: No results for input(s): HGBA1C in the last 72 hours. CBG: No results for input(s): GLUCAP in the last 168 hours. Lipid Profile: No results for input(s): CHOL, HDL, LDLCALC, TRIG, CHOLHDL, LDLDIRECT in the last 72 hours. Thyroid Function Tests: No results for input(s): TSH, T4TOTAL, FREET4, T3FREE, THYROIDAB in the last 72 hours. Anemia Panel: No results for input(s): VITAMINB12, FOLATE, FERRITIN, TIBC, IRON, RETICCTPCT in the last 72 hours. Urine analysis:    Component Value Date/Time   COLORURINE YELLOW 03/01/2017 2331   APPEARANCEUR CLEAR 03/01/2017 2331   LABSPEC 1.023 03/01/2017 2331   PHURINE 6.0 03/01/2017 2331   GLUCOSEU NEGATIVE 03/01/2017 2331   HGBUR NEGATIVE 03/01/2017 2331   BILIRUBINUR NEGATIVE 03/01/2017 2331   KETONESUR NEGATIVE 03/01/2017 2331   PROTEINUR 30  (A) 03/01/2017 2331   UROBILINOGEN 1.0 06/06/2014 1311   NITRITE NEGATIVE 03/01/2017 2331   LEUKOCYTESUR NEGATIVE 03/01/2017 2331   Sepsis Labs: @LABRCNTIP (procalcitonin:4,lacticidven:4) )No results found for this or any previous visit (from the past 240 hour(s)).   Radiological Exams on Admission: Dg Chest Portable 1 View  Result Date: 02/16/2018 CLINICAL DATA:  Bilateral lower extremity edema. Concern for fluid overload. EXAM: PORTABLE CHEST 1 VIEW COMPARISON:  Radiographs 03/02/2017, 12/17/2016 FINDINGS: Similar mild cardiomegaly and tortuous aorta. Atherosclerosis of the aortic arch. No pulmonary edema. Minimal blunting of right costophrenic angle appears chronic and unchanged from prior exam, no definite pleural effusion. No focal airspace disease. No pneumothorax. Prominence of the right paratracheal stripe likely due to patient rotation and present on prior exam. Multilevel degenerative change in the spine. Degenerative change of both shoulders. IMPRESSION: 1. No pulmonary edema or evidence of fluid overload in the chest. 2. Chronic cardiomegaly.  Probable right basilar scarring. Electronically Signed   By: Jeb Levering M.D.   On: 02/16/2018 00:31   Dg Knee Complete 4 Views Right  Result Date: 02/15/2018 CLINICAL DATA:  Significant increase in RIGHT knee swelling and pain, chronic leg edema and venous insufficiency EXAM: RIGHT KNEE - COMPLETE 4+ VIEW COMPARISON:  06/06/2014 FINDINGS: Marked osseous demineralization. Significant degenerative changes of the RIGHT knee with joint space narrowing and spur formation greatest at lateral compartment where bone-on-bone appearance is seen. Bulky patellofemoral spur formation as well. Observed degenerative changes are progressive since prior exam. No acute fracture or dislocation. No knee joint effusion. Diffuse soft tissue swelling RIGHT leg. IMPRESSION: Diffuse soft tissue swelling RIGHT leg. Osseous demineralization with advanced degenerative  changes of the RIGHT knee, progressive since 2015. Electronically Signed   By: Lavonia Dana M.D.   On: 02/15/2018 16:47   Dg Foot Complete Left  Result Date: 02/15/2018 CLINICAL DATA:  Significant increase in swelling with blistering and drainage LEFT foot EXAM: LEFT FOOT - COMPLETE 3+ VIEW COMPARISON:  02/11/2013 FINDINGS: Diffuse osseous demineralization. Diffuse soft tissue  swelling. Joint spaces preserved. No acute fracture, dislocation, or bone destruction. Plantar and Achilles insertion calcaneal spur formation. IMPRESSION: Osseous demineralization and calcaneal spur formation. Significant soft tissue swelling without acute bony abnormalities. Electronically Signed   By: Lavonia Dana M.D.   On: 02/15/2018 16:48     Assessment/Plan Active Problems:   Bilateral lower extremity edema   Swelling of joint, knee, right   Venous insufficiency of both lower extremities, chronic   Edema    1. Bilateral lower extremity edema with worsening of swelling -patient's BNP is mildly elevated but patient denies any chest pain or shortness of breath.  Lasix 20 mg IV every 12 has been ordered.  We will get physical therapy and wound team consult.  For now patient is empirically placed on doxycycline for possible cellulitis.  Check Dopplers to rule out DVT. 2. Right knee swelling appears to be chronic with acute worsening -we will check sed rate and uric acid levels.  If pain persists may need to get a joint aspiration. 3. Previous history of diabetes mellitus off medications now. 4. Microcytic hypochromic anemia -follow CBC.  If no significant follow hemoglobin further work-up as outpatient.   DVT prophylaxis: Lovenox. Code Status: Full code. Family Communication: Discussed with patient. Disposition Plan: Home. Consults called: Physical therapy and wound team. Admission status: Observation.   Rise Patience MD [facility] Pager [phone].  If 7PM-7AM, please contact  night-coverage [email or web address] Password [email or web address]  02/16/2018, 2:51 AM

## 2018-02-17 DIAGNOSIS — R4189 Other symptoms and signs involving cognitive functions and awareness: Secondary | ICD-10-CM | POA: Diagnosis not present

## 2018-02-17 DIAGNOSIS — E669 Obesity, unspecified: Secondary | ICD-10-CM | POA: Diagnosis not present

## 2018-02-17 DIAGNOSIS — R413 Other amnesia: Secondary | ICD-10-CM | POA: Diagnosis not present

## 2018-02-17 DIAGNOSIS — R600 Localized edema: Secondary | ICD-10-CM | POA: Diagnosis not present

## 2018-02-17 DIAGNOSIS — E11620 Type 2 diabetes mellitus with diabetic dermatitis: Secondary | ICD-10-CM | POA: Diagnosis not present

## 2018-02-17 LAB — CBC
HCT: 32.7 % — ABNORMAL LOW (ref 36.0–46.0)
Hemoglobin: 9.8 g/dL — ABNORMAL LOW (ref 12.0–15.0)
MCH: 22.2 pg — AB (ref 26.0–34.0)
MCHC: 30 g/dL (ref 30.0–36.0)
MCV: 74 fL — ABNORMAL LOW (ref 78.0–100.0)
PLATELETS: 199 10*3/uL (ref 150–400)
RBC: 4.42 MIL/uL (ref 3.87–5.11)
RDW: 14.9 % (ref 11.5–15.5)
WBC: 4.5 10*3/uL (ref 4.0–10.5)

## 2018-02-17 LAB — BASIC METABOLIC PANEL
Anion gap: 5 (ref 5–15)
BUN: 15 mg/dL (ref 6–20)
CO2: 29 mmol/L (ref 22–32)
Calcium: 8.6 mg/dL — ABNORMAL LOW (ref 8.9–10.3)
Chloride: 107 mmol/L (ref 101–111)
Creatinine, Ser: 0.78 mg/dL (ref 0.44–1.00)
GFR calc Af Amer: >60 mL/min (ref >60)
Glucose, Bld: 85 mg/dL (ref 65–99)
POTASSIUM: 3.6 mmol/L (ref 3.5–5.1)
SODIUM: 141 mmol/L (ref 135–145)

## 2018-02-17 LAB — GLUCOSE, CAPILLARY
GLUCOSE-CAPILLARY: 76 mg/dL (ref 65–99)
GLUCOSE-CAPILLARY: 108 mg/dL — AB (ref 65–99)
Glucose-Capillary: 88 mg/dL (ref 65–99)
Glucose-Capillary: 176 mg/dL — ABNORMAL HIGH (ref 65–99)

## 2018-02-17 NOTE — Clinical Social Work Placement (Signed)
   CLINICAL SOCIAL WORK PLACEMENT  NOTE  Date:  02/17/2018  Patient Details  Name: [name] MRN: [id number] Date of Birth: February 24, 1946  Clinical Social Work is seeking post-discharge placement for this patient at the [place] level of care (*CSW will initial, date and re-position this form in  chart as items are completed):  Yes   Patient/family provided with [place] Work Department's list of facilities offering this level of care within the geographic area requested by the patient (or if unable, by the patient's family).  Yes   Patient/family informed of their freedom to choose among providers that offer the needed level of care, that participate in Medicare, Medicaid or managed care program needed by the patient, have an available bed and are willing to accept the patient.  Yes   Patient/family informed of [facility]'s ownership interest in [facility] and [facility], as well as of the fact that they are under no obligation to receive care at these facilities.  PASRR submitted to EDS on 02/17/18     PASRR number received on       Existing PASRR number confirmed on 02/17/18     FL2 transmitted to all facilities in geographic area requested by pt/family on 02/17/18     FL2 transmitted to all facilities within larger geographic area on       Patient informed that his/her managed care company has contracts with or will negotiate with certain facilities, including the following:            Patient/family informed of bed offers received.  Patient chooses bed at       Physician recommends and patient chooses bed at      Patient to be transferred to   on  .  Patient to be transferred to facility by       Patient family notified on   of transfer.  Name of family member notified:        PHYSICIAN Please sign FL2     Additional Comment:    _______________________________________________ Candie Chroman, LCSW 02/17/2018,  11:54 AM

## 2018-02-17 NOTE — Clinical Social Work Note (Signed)
Clinical Social Work Assessment  Patient Details  Name: [name] MRN: [id number] Date of Birth: [birth year]  Date of referral:  02/17/18               Reason for consult:  Facility Placement, Discharge Planning                Permission sought to share information with:  Chartered certified accountant granted to share information::  Yes, Verbal Permission Granted  Name::        Agency::  SNF's  Relationship::     Contact Information:     Housing/Transportation Living arrangements for the past 2 months:  Single Family Home Source of Information:  Patient, Medical Team Patient Interpreter Needed:  None Criminal Activity/Legal Involvement Pertinent to Current Situation/Hospitalization:  No - Comment as needed Significant Relationships:  Adult Children, Siblings, Other Family Members(Nephew) Lives with:  Adult Children, Siblings, Relatives(Nephew) Do you feel safe going back to the place where you live?  Yes Need for family participation in patient care:  Yes (Comment)  Care giving concerns:  PT recommending SNF once medically stable for discharge.   Social Worker assessment / plan:  CSW met with patient. No supports at bedside. CSW introduced role and explained that PT recommendations would be discussed. Patient unsure if she wants HHPT vs. SNF. She will think about it and let CSW know decision later this afternoon. If she goes to SNF, she does not want [facility]. Patient has been to [place] in the past and worked at [employer] (when they were under previous management). 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 She reports that she enjoyed her job. If patient returns home, she states she lives with her daughter, sister, and nephew. Her nephew works but someone is always at home with her. No further concerns. CSW encouraged patient to contact CSW as needed. CSW will continue to follow patient for support and facilitate  discharge to SNF, if agreeable, once medically stable.  Employment status:  Retired Nurse, adult PT Recommendations:  [place] / Referral to community resources:  [place]  Patient/Family's Response to care:  Patient not sure if she wants SNF vs. HHPT. Patient's family supportive and involved in patient's care. Patient appreciated social work intervention.  Patient/Family's Understanding of and Emotional Response to Diagnosis, Current Treatment, and Prognosis:  Patient has a good understanding of the reason for admission and PT recommendations. Patient appears happy with hospital care.  Emotional Assessment Appearance:  Appears stated age Attitude/Demeanor/Rapport:  Engaged, Gracious Affect (typically observed):  Accepting, Appropriate, Calm, Pleasant Orientation:  Oriented to Self, Oriented to Place, Oriented to  Time, Oriented to Situation Alcohol / Substance use:  Never Used Psych involvement (Current and /or in the community):  No (Comment)  Discharge Needs  Concerns to be addressed:  Care Coordination Readmission within the last 30 days:  No Current discharge risk:  Dependent with Mobility Barriers to Discharge:  Continued Medical Work up, [place], LCSW 02/17/2018, 11:50 AM

## 2018-02-17 NOTE — Progress Notes (Signed)
Progress Note    SHAYLI ALTEMOSE  WRU:[id number] DOB: 26-Oct-1945  DOA: 02/15/2018 PCP: Forrest Moron, MD    Brief Narrative:      Medical records reviewed and are as summarized below:  EVEREST HACKING is an 72 y.o. female with history of chronic venous stasis dermatitis and edema with previous history of diabetes mellitus off medications for last 6 months presents to the ER because of worsening swelling of the lower extremities.  Patient is a poor historian.  Patient states over the last few weeks patient's lower extremity edema has worsened with increasing swelling and seeping of fluid.  Denies any fever chills.  Patient states she did fall 4 weeks ago after which patient's right knee swelling also has worsened.  Patient does have a history of chronic right knee swelling.    Assessment/Plan:   Active Problems:   Bilateral lower extremity edema   Swelling of joint, knee, right   Venous insufficiency of both lower extremities, chronic   Edema 1.   Bilateral lower extremity edema with worsening of swelling  - Lasix 20 mg IV every 12 with good diuresis.   -duplexes negative for DVT -echo: Left ventricle: The cavity size was normal. Systolic function was   vigorous. The estimated ejection fraction was in the range of 65%   to 70%. Wall motion was normal; there were no regional wall   motion abnormalities. Doppler parameters are consistent with   abnormal left ventricular relaxation (grade 1 diastolic   dysfunction). Doppler parameters are consistent with elevated   ventricular end-diastolic filling pressure.  Right knee swelling appears to be chronic with acute worsening -orthopedic follow up X ray with bone on bone  Memory impairment -? Baseline PT recommending SNF  Anemia -add Fe panel for AM -MCV: 74  obesity Body mass index is 31.48 kg/m.   Family Communication/Anticipated D/C date and plan/Code Status   DVT prophylaxis: Lovenox ordered. Code Status:  Full Code.  Family Communication: none available Disposition Plan: SNF?   Medical Consultants:    None.     Subjective:   Cleaning out purse, no current complaints  Objective:    Vitals:   02/16/18 2005 02/16/18 2357 02/17/18 0418 02/17/18 1203  BP: 124/65 (!) 156/93 137/89 135/81  Pulse: 95 94 81 81  Resp: 18 18 18 18   Temp: 98.3 F (36.8 C) 98.2 F (36.8 C) 97.6 F (36.4 C) 98.5 F (36.9 C)  TempSrc: Oral Oral Oral Oral  SpO2: 98% 96% 99% 100%  Weight:   91.2 kg (201 lb)   Height:        Intake/Output Summary (Last 24 hours) at 02/17/2018 1512 Last data filed at 02/17/2018 1444 Gross per 24 hour  Intake 1080 ml  Output 1725 ml  Net -645 ml   Filed Weights   02/15/18 1449 02/16/18 0418 02/17/18 0418  Weight: 95.3 kg (210 lb) 92.5 kg (203 lb 14.4 oz) 91.2 kg (201 lb)    Exam: In bed, conversant but pleasantly confused rrr No increased work of breathing B/l LE edema but improved with areas showing darkening and firm spots  Data Reviewed:   I have personally reviewed following labs and imaging studies:  Labs: Labs show the following:   Basic Metabolic Panel: Recent Labs  Lab 02/15/18 1528 02/16/18 0521 02/17/18 0558  NA 141 142 141  K 3.7 3.7 3.6  CL 109 107 107  CO2 25 28 29   GLUCOSE 73 72 85  BUN 15  16 15  CREATININE 0.77 0.85 0.78  CALCIUM 9.0 8.7* 8.6*   GFR Estimated Creatinine Clearance: 71.5 mL/min (by C-G formula based on SCr of 0.78 mg/dL). Liver Function Tests: Recent Labs  Lab 02/15/18 1528 02/16/18 0521  AST 15 12*  ALT 12* 10*  ALKPHOS 93 73  BILITOT 0.7 0.7  PROT 8.0 6.3*  ALBUMIN 3.7 2.9*   No results for input(s): LIPASE, AMYLASE in the last 168 hours. No results for input(s): AMMONIA in the last 168 hours. Coagulation profile No results for input(s): INR, PROTIME in the last 168 hours.  CBC: Recent Labs  Lab 02/15/18 1528 02/16/18 0521 02/17/18 0558  WBC 5.2 5.0 4.5  NEUTROABS 3.4  --   --   HGB 11.4*  9.3* 9.8*  HCT 38.7 31.5* 32.7*  MCV 76.9* 75.0* 74.0*  PLT 216 190 199   Cardiac Enzymes: No results for input(s): CKTOTAL, CKMB, CKMBINDEX, TROPONINI in the last 168 hours. BNP (last 3 results) No results for input(s): PROBNP in the last 8760 hours. CBG: Recent Labs  Lab 02/16/18 1218 02/16/18 1625 02/16/18 2125 02/17/18 0755 02/17/18 1150  GLUCAP 119* 72 131* 76 88   D-Dimer: No results for input(s): DDIMER in the last 72 hours. Hgb A1c: No results for input(s): HGBA1C in the last 72 hours. Lipid Profile: No results for input(s): CHOL, HDL, LDLCALC, TRIG, CHOLHDL, LDLDIRECT in the last 72 hours. Thyroid function studies: No results for input(s): TSH, T4TOTAL, T3FREE, THYROIDAB in the last 72 hours.  Invalid input(s): FREET3 Anemia work up: No results for input(s): VITAMINB12, FOLATE, FERRITIN, TIBC, IRON, RETICCTPCT in the last 72 hours. Sepsis Labs: Recent Labs  Lab 02/15/18 1528 02/15/18 1534 02/16/18 0521 02/17/18 0558  WBC 5.2  --  5.0 4.5  LATICACIDVEN  --  0.83  --   --     Microbiology Recent Results (from the past 240 hour(s))  MRSA PCR Screening     Status: None   Collection Time: 02/16/18  4:25 AM  Result Value Ref Range Status   MRSA by PCR NEGATIVE NEGATIVE Final    Comment:        The GeneXpert MRSA Assay (FDA approved for NASAL specimens only), is one component of a comprehensive MRSA colonization surveillance program. It is not intended to diagnose MRSA infection nor to guide or monitor treatment for MRSA infections. Performed at [facility], [place] [address] [place],  [address]     Procedures and diagnostic studies:  Dg Chest Portable 1 View  Result Date: 02/16/2018 CLINICAL DATA:  Bilateral lower extremity edema. Concern for fluid overload. EXAM: PORTABLE CHEST 1 VIEW COMPARISON:  Radiographs 03/02/2017, 12/17/2016 FINDINGS: Similar mild cardiomegaly and tortuous aorta. Atherosclerosis of the aortic arch. No pulmonary  edema. Minimal blunting of right costophrenic angle appears chronic and unchanged from prior exam, no definite pleural effusion. No focal airspace disease. No pneumothorax. Prominence of the right paratracheal stripe likely due to patient rotation and present on prior exam. Multilevel degenerative change in the spine. Degenerative change of both shoulders. IMPRESSION: 1. No pulmonary edema or evidence of fluid overload in the chest. 2. Chronic cardiomegaly.  Probable right basilar scarring. Electronically Signed   By: Jeb Levering M.D.   On: 02/16/2018 00:31   Dg Knee Complete 4 Views Right  Result Date: 02/15/2018 CLINICAL DATA:  Significant increase in RIGHT knee swelling and pain, chronic leg edema and venous insufficiency EXAM: RIGHT KNEE - COMPLETE 4+ VIEW COMPARISON:  06/06/2014 FINDINGS: Marked osseous  demineralization. Significant degenerative changes of the RIGHT knee with joint space narrowing and spur formation greatest at lateral compartment where bone-on-bone appearance is seen. Bulky patellofemoral spur formation as well. Observed degenerative changes are progressive since prior exam. No acute fracture or dislocation. No knee joint effusion. Diffuse soft tissue swelling RIGHT leg. IMPRESSION: Diffuse soft tissue swelling RIGHT leg. Osseous demineralization with advanced degenerative changes of the RIGHT knee, progressive since 2015. Electronically Signed   By: Lavonia Dana M.D.   On: 02/15/2018 16:47   Dg Foot Complete Left  Result Date: 02/15/2018 CLINICAL DATA:  Significant increase in swelling with blistering and drainage LEFT foot EXAM: LEFT FOOT - COMPLETE 3+ VIEW COMPARISON:  02/11/2013 FINDINGS: Diffuse osseous demineralization. Diffuse soft tissue swelling. Joint spaces preserved. No acute fracture, dislocation, or bone destruction. Plantar and Achilles insertion calcaneal spur formation. IMPRESSION: Osseous demineralization and calcaneal spur formation. Significant soft tissue  swelling without acute bony abnormalities. Electronically Signed   By: Lavonia Dana M.D.   On: 02/15/2018 16:48    Medications:   . enoxaparin (LOVENOX) injection  40 mg Subcutaneous Daily  . furosemide  20 mg Intravenous Q12H  . insulin aspart  0-9 Units Subcutaneous TID WC  . pregabalin  50 mg Oral BID   Continuous Infusions:   LOS: 0 days   Geradine Girt  [facility]   *Please refer to [email or web address], password [email or web address] to get updated schedule on who will round on this patient, as hospitalists switch teams weekly. If 7PM-7AM, please contact night-coverage at [email or web address], password [email or web address] for any overnight needs.  02/17/2018, 3:12 PM

## 2018-02-17 NOTE — Clinical Social Work Note (Addendum)
CSW met with patient to provide bed offers. Patient very quick to forget what we are discussing and gets confused regarding what SNF is. She was talking about going to pick up her niece before she goes to the facility but when asked, does not know what SNF is for. CSW asked patient if I could call family. Patient is agreeable and wants CSW to call her sister first. CSW left Ms. Meadow a message.   , CSW [phone]  4:11 pm CSW spoke with patient's daughter about SNF. She is agreeable and chose [facility]. CSW left message for admissions coordinator to start auth. Patient stated patient has dementia but is not taking medications. She reports patient has hallucinations of someone sleeping on her couch an her deceased husband. Patient lives home alone. Her daughter came to the home on one occasion and patient had left hamburger meat in the microwave. When it was found there were maggots all over it. Patient's daughter voiced many concerns about patient living home alone. She states she has begged patient to move in with her but patient adamantly refuses. CSW has notified MD. Patient is on the wait list for [facility] and needs a ramp at home for her wheelchair.   , CSW [phone]  

## 2018-02-17 NOTE — NC FL2 (Signed)
[place] LEVEL OF CARE SCREENING TOOL     IDENTIFICATION  Patient Name: [name] Birthdate: 07/15/1946 Sex: female Admission Date (Current Location): 02/15/2018  [facility] and [place] Number:  Herbalist and Address:  The . [facility], [place] [address] [place], [place] [address]      Provider Number: [id number]  Attending Physician Name and Address:  Geradine Girt, DO  Relative Name and Phone Number:       Current Level of Care: Hospital Recommended Level of Care: [place] Prior Approval Number:    Date Approved/Denied:   PASRR Number: [id number]  Discharge Plan: SNF    Current Diagnoses: Patient Active Problem List   Diagnosis Date Noted  . Edema 02/16/2018  . Physical deconditioning 04/04/2017  . Eczema 03/24/2017  . Decreased independence with activities of daily living 03/24/2017  . Toenail deformity 03/24/2017  . Decreased mobility and endurance 02/07/2017  . Cellulitis   . [place] scabies 12/18/2016  . Community acquired pneumonia of right lower lobe of lung ([place])   . Dehydration   . Failure to thrive in adult   . Impetigo   . Rash of entire body 12/06/2016  . Localized swelling, mass, or lump of lower extremity, bilateral 12/06/2016  . Constipation 03/24/2016  . Fall 02/04/2016  . Chronic anemia 02/04/2016  . Cognitive decline 02/04/2016  . Cellulitis of right arm 01/19/2016  . Swelling of joint, knee, right 10/19/2015  . Venous insufficiency of both lower extremities, chronic 10/19/2015  . Bilateral lower extremity edema 05/24/2015  . Type 2 diabetes mellitus, controlled ([place]) 05/24/2015  . Malnutrition of moderate degree ([place]) 06/07/2014  . Acute pericarditis, unspecified 06/07/2014  . Hypoglycemia 06/06/2014  . Fever 06/06/2014  . Knee pain, chronic 06/06/2014  . Mass of right side of neck 06/06/2014  . Severe protein-calorie malnutrition ([place]) 06/06/2014  . Microcytic anemia  06/06/2014    Orientation RESPIRATION BLADDER Height & Weight     Self, Time, Situation, Place  Normal Incontinent, External catheter(At times) Weight: 201 lb (91.2 kg)(scale b) Height:  5\' 7"  (170.2 cm)  BEHAVIORAL SYMPTOMS/MOOD NEUROLOGICAL BOWEL NUTRITION STATUS  (None) (None) Continent Diet(Heart healthy.)  AMBULATORY STATUS COMMUNICATION OF NEEDS Skin   Limited Assist Verbally Other (Comment)(Blister)                       Personal Care Assistance Level of Assistance              Functional Limitations Info  Sight, Hearing, Speech Sight Info: Adequate Hearing Info: Adequate Speech Info: Adequate    SPECIAL CARE FACTORS FREQUENCY  PT (By licensed PT)     PT Frequency: 5 x week              Contractures Contractures Info: Not present    Additional Factors Info  Code Status, Allergies Code Status Info: Full Allergies Info: Orange Concentrate (Flavoring Agent), Peach (Prunus Persica), Penicillins, Strawberry Extract, Adhesive (Tape), Aspirin, Latex, Other, Pineapple, Strawberry Flavor           Current Medications (02/17/2018):  This is the current hospital active medication list Current Facility-Administered Medications  Medication Dose Route Frequency Provider Last Rate Last Dose  . acetaminophen (TYLENOL) tablet 650 mg  650 mg Oral Q6H PRN Rise Patience, MD       Or  . acetaminophen (TYLENOL) suppository 650 mg  650 mg Rectal Q6H PRN Rise Patience, MD      .  enoxaparin (LOVENOX) injection 40 mg  40 mg Subcutaneous Daily Rise Patience, MD   40 mg at 02/17/18 0912  . furosemide (LASIX) injection 20 mg  20 mg Intravenous Q12H Rise Patience, MD   20 mg at 02/17/18 0912  . HYDROcodone-acetaminophen (NORCO/VICODIN) 5-325 MG per tablet 1 tablet  1 tablet Oral Q6H PRN Eliseo Squires, Jessica U, DO      . insulin aspart (novoLOG) injection 0-9 Units  0-9 Units Subcutaneous TID WC Rise Patience, MD      . ondansetron [facility]) tablet 4  mg  4 mg Oral Q6H PRN Rise Patience, MD       Or  . ondansetron [facility]) injection 4 mg  4 mg Intravenous Q6H PRN Rise Patience, MD      . pregabalin (LYRICA) capsule 50 mg  50 mg Oral BID Rise Patience, MD   50 mg at 02/17/18 0912  . traMADol (ULTRAM) tablet 50 mg  50 mg Oral Q6H PRN Rise Patience, MD         Discharge Medications: Please see discharge summary for a list of discharge medications.  Relevant Imaging Results:  Relevant Lab Results:   Additional Information SS#: [id number]. DSS came by yesterday (6/10) and said a recent APS report had been made. Did not give details.  Candie Chroman, LCSW

## 2018-02-18 ENCOUNTER — Encounter (HOSPITAL_COMMUNITY): Payer: Self-pay

## 2018-02-18 DIAGNOSIS — R4189 Other symptoms and signs involving cognitive functions and awareness: Secondary | ICD-10-CM | POA: Diagnosis not present

## 2018-02-18 DIAGNOSIS — E11620 Type 2 diabetes mellitus with diabetic dermatitis: Secondary | ICD-10-CM | POA: Diagnosis not present

## 2018-02-18 DIAGNOSIS — R600 Localized edema: Secondary | ICD-10-CM | POA: Diagnosis not present

## 2018-02-18 LAB — IRON AND TIBC
IRON: 34 ug/dL (ref 28–170)
SATURATION RATIOS: 17 % (ref 10.4–31.8)
TIBC: 203 ug/dL — AB (ref 250–450)
UIBC: 169 ug/dL

## 2018-02-18 LAB — BASIC METABOLIC PANEL
Anion gap: 6 (ref 5–15)
BUN: 17 mg/dL (ref 6–20)
CALCIUM: 8.2 mg/dL — AB (ref 8.9–10.3)
CO2: 27 mmol/L (ref 22–32)
Chloride: 107 mmol/L (ref 101–111)
Creatinine, Ser: 0.88 mg/dL (ref 0.44–1.00)
GFR calc Af Amer: >60 mL/min (ref >60)
GFR calc non Af Amer: >60 mL/min (ref >60)
GLUCOSE: 81 mg/dL (ref 65–99)
Potassium: 3.6 mmol/L (ref 3.5–5.1)
Sodium: 140 mmol/L (ref 135–145)

## 2018-02-18 LAB — CBC
HEMATOCRIT: 32 % — AB (ref 36.0–46.0)
Hemoglobin: 9.7 g/dL — ABNORMAL LOW (ref 12.0–15.0)
MCH: 22.4 pg — AB (ref 26.0–34.0)
MCHC: 30.3 g/dL (ref 30.0–36.0)
MCV: 73.7 fL — AB (ref 78.0–100.0)
PLATELETS: 192 10*3/uL (ref 150–400)
RBC: 4.34 MIL/uL (ref 3.87–5.11)
RDW: 15 % (ref 11.5–15.5)
WBC: 4.8 10*3/uL (ref 4.0–10.5)

## 2018-02-18 LAB — GLUCOSE, CAPILLARY
GLUCOSE-CAPILLARY: 92 mg/dL (ref 65–99)
Glucose-Capillary: 76 mg/dL (ref 65–99)

## 2018-02-18 LAB — FERRITIN: Ferritin: 95 ng/mL (ref 11–307)

## 2018-02-18 MED ORDER — TRAMADOL HCL 50 MG PO TABS: 50.0000 mg | ORAL_TABLET | Freq: Four times a day (QID) | ORAL | 0 refills | Status: DC | PRN

## 2018-02-18 MED ORDER — ACETAMINOPHEN 325 MG PO TABS: 650.0000 mg | ORAL_TABLET | Freq: Four times a day (QID) | ORAL | Status: AC | PRN

## 2018-02-18 MED ORDER — POTASSIUM CHLORIDE CRYS ER 10 MEQ PO TBCR: 10.0000 meq | EXTENDED_RELEASE_TABLET | Freq: Every day | ORAL | Status: DC

## 2018-02-18 MED ORDER — FUROSEMIDE 40 MG PO TABS
40.0000 mg | ORAL_TABLET | Freq: Every day | ORAL | Status: DC
  Administered 2018-02-18: 40 mg via ORAL
  Filled 2018-02-18: qty 1

## 2018-02-18 MED ORDER — POTASSIUM CHLORIDE CRYS ER 10 MEQ PO TBCR
10.0000 meq | EXTENDED_RELEASE_TABLET | Freq: Every day | ORAL | Status: DC
  Administered 2018-02-18: 10 meq via ORAL
  Filled 2018-02-18: qty 1

## 2018-02-18 MED ORDER — FUROSEMIDE 40 MG PO TABS: 40.0000 mg | ORAL_TABLET | Freq: Every day | ORAL | Status: DC

## 2018-02-18 NOTE — Discharge Summary (Signed)
Physician Discharge Summary  [name] ZDG:[id number] DOB: Dec 01, 1945 DOA: 02/15/2018  PCP: Forrest Moron, MD  Admit date: 02/15/2018 Discharge date: 02/18/2018  Admitted From: home Discharge disposition: SNF   Recommendations for Outpatient Follow-Up:   1. Keep lower extremities elevated 2. Referral to neurology for memory impairment 3. BMP 1 week 4. Daily weights   Discharge Diagnosis:   Active Problems:   Bilateral lower extremity edema   Swelling of joint, knee, right   Venous insufficiency of both lower extremities, chronic   Edema    Discharge Condition: Improved.  Diet recommendation: Low sodium, heart healthy.  Carbohydrate-modified.  Wound care: Marland Kitchen  Code status: Full.   History of Present Illness:   [name] is an 72 y.o. female withhistory of chronic venous stasis dermatitis and edema with previous history of diabetes mellitus off medications for last 6 months presents to the ER because of worsening swelling of the lower extremities. Patient is a poor historian. Patient states over the last few weeks patient's lower extremity edema has worsened with increasing swelling and seeping of fluid. Denies any fever chills. Patient states she did fall 4 weeks ago after which patient's right knee swelling also has worsened. Patient does have a history of chronic right knee swelling.     Hospital Course by Problem:   Bilateral lower extremity edema with worsening of swelling -Lasix 20 mg IV every 12 with good diuresis- change to PO and monitor Cr at rehab -duplexes negative for DVT -echo: Left ventricle: The cavity size was normal. Systolic function was vigorous. The estimated ejection fraction was in the range of 65% to 70%. Wall motion was normal; there were no regional wall motion abnormalities. Doppler parameters are consistent with abnormal left ventricular relaxation (grade 1 diastolic dysfunction). Doppler  parameters are consistent with elevated ventricular end-diastolic filling pressure.  Right knee swelling appears to be chronic with acute worsening -orthopedic follow up X ray with bone on bone  Memory impairment -? Baseline PT recommending SNF -will need formal evaluation by neurology  Anemia -iron normal -MCV: 74  obesity Body mass index is 31.48 kg/m.      Medical Consultants:   none   Discharge Exam:   Vitals:   02/18/18 0804 02/18/18 1225  BP: 136/79 (!) 141/77  Pulse: 78 80  Resp: 18 17  Temp: 98.4 F (36.9 C) 97.9 F (36.6 C)  SpO2: 100% 99%   Vitals:   02/18/18 0453 02/18/18 0454 02/18/18 0804 02/18/18 1225  BP:  135/77 136/79 (!) 141/77  Pulse:  85 78 80  Resp:   18 17  Temp:  98.6 F (37 C) 98.4 F (36.9 C) 97.9 F (36.6 C)  TempSrc:  Oral Oral Oral  SpO2:  100% 100% 99%  Weight: 89.8 kg (197 lb 15.6 oz)   87.6 kg (193 lb 2 oz)  Height: 5\' 7"  (1.702 m)   5\' 7"  (1.702 m)    General exam: Appears calm and comfortable, excited for rehab  The results of significant diagnostics from this hospitalization (including imaging, microbiology, ancillary and laboratory) are listed below for reference.     Procedures and Diagnostic Studies:   Dg Chest Portable 1 View  Result Date: 02/16/2018 CLINICAL DATA:  Bilateral lower extremity edema. Concern for fluid overload. EXAM: PORTABLE CHEST 1 VIEW COMPARISON:  Radiographs 03/02/2017, 12/17/2016 FINDINGS: Similar mild cardiomegaly and tortuous aorta. Atherosclerosis of the aortic arch. No pulmonary edema. Minimal blunting of right costophrenic angle appears  chronic and unchanged from prior exam, no definite pleural effusion. No focal airspace disease. No pneumothorax. Prominence of the right paratracheal stripe likely due to patient rotation and present on prior exam. Multilevel degenerative change in the spine. Degenerative change of both shoulders. IMPRESSION: 1. No pulmonary edema or evidence of fluid  overload in the chest. 2. Chronic cardiomegaly.  Probable right basilar scarring. Electronically Signed   By: Jeb Levering M.D.   On: 02/16/2018 00:31   Dg Knee Complete 4 Views Right  Result Date: 02/15/2018 CLINICAL DATA:  Significant increase in RIGHT knee swelling and pain, chronic leg edema and venous insufficiency EXAM: RIGHT KNEE - COMPLETE 4+ VIEW COMPARISON:  06/06/2014 FINDINGS: Marked osseous demineralization. Significant degenerative changes of the RIGHT knee with joint space narrowing and spur formation greatest at lateral compartment where bone-on-bone appearance is seen. Bulky patellofemoral spur formation as well. Observed degenerative changes are progressive since prior exam. No acute fracture or dislocation. No knee joint effusion. Diffuse soft tissue swelling RIGHT leg. IMPRESSION: Diffuse soft tissue swelling RIGHT leg. Osseous demineralization with advanced degenerative changes of the RIGHT knee, progressive since 2015. Electronically Signed   By: Lavonia Dana M.D.   On: 02/15/2018 16:47   Dg Foot Complete Left  Result Date: 02/15/2018 CLINICAL DATA:  Significant increase in swelling with blistering and drainage LEFT foot EXAM: LEFT FOOT - COMPLETE 3+ VIEW COMPARISON:  02/11/2013 FINDINGS: Diffuse osseous demineralization. Diffuse soft tissue swelling. Joint spaces preserved. No acute fracture, dislocation, or bone destruction. Plantar and Achilles insertion calcaneal spur formation. IMPRESSION: Osseous demineralization and calcaneal spur formation. Significant soft tissue swelling without acute bony abnormalities. Electronically Signed   By: Lavonia Dana M.D.   On: 02/15/2018 16:48     Labs:   Basic Metabolic Panel: Recent Labs  Lab 02/15/18 1528 02/16/18 0521 02/17/18 0558 02/18/18 0436  NA 141 142 141 140  K 3.7 3.7 3.6 3.6  CL 109 107 107 107  CO2 25 28 29 27   GLUCOSE 73 72 85 81  BUN 15 16 15 17   CREATININE 0.77 0.85 0.78 0.88  CALCIUM 9.0 8.7* 8.6* 8.2*    GFR Estimated Creatinine Clearance: 63.8 mL/min (by C-G formula based on SCr of 0.88 mg/dL). Liver Function Tests: Recent Labs  Lab 02/15/18 1528 02/16/18 0521  AST 15 12*  ALT 12* 10*  ALKPHOS 93 73  BILITOT 0.7 0.7  PROT 8.0 6.3*  ALBUMIN 3.7 2.9*   No results for input(s): LIPASE, AMYLASE in the last 168 hours. No results for input(s): AMMONIA in the last 168 hours. Coagulation profile No results for input(s): INR, PROTIME in the last 168 hours.  CBC: Recent Labs  Lab 02/15/18 1528 02/16/18 0521 02/17/18 0558 02/18/18 0436  WBC 5.2 5.0 4.5 4.8  NEUTROABS 3.4  --   --   --   HGB 11.4* 9.3* 9.8* 9.7*  HCT 38.7 31.5* 32.7* 32.0*  MCV 76.9* 75.0* 74.0* 73.7*  PLT 216 190 199 192   Cardiac Enzymes: No results for input(s): CKTOTAL, CKMB, CKMBINDEX, TROPONINI in the last 168 hours. BNP: Invalid input(s): POCBNP CBG: Recent Labs  Lab 02/17/18 1150 02/17/18 1605 02/17/18 2116 02/18/18 0732 02/18/18 1238  GLUCAP 88 176* 108* 76 92   D-Dimer No results for input(s): DDIMER in the last 72 hours. Hgb A1c No results for input(s): HGBA1C in the last 72 hours. Lipid Profile No results for input(s): CHOL, HDL, LDLCALC, TRIG, CHOLHDL, LDLDIRECT in the last 72 hours. Thyroid function studies No results  for input(s): TSH, T4TOTAL, T3FREE, THYROIDAB in the last 72 hours.  Invalid input(s): FREET3 Anemia work up Recent Labs    02/18/18 0436  FERRITIN 95  TIBC 203*  IRON 34   Microbiology Recent Results (from the past 240 hour(s))  MRSA PCR Screening     Status: None   Collection Time: 02/16/18  4:25 AM  Result Value Ref Range Status   MRSA by PCR NEGATIVE NEGATIVE Final    Comment:        The GeneXpert MRSA Assay (FDA approved for NASAL specimens only), is one component of a comprehensive MRSA colonization surveillance program. It is not intended to diagnose MRSA infection nor to guide or monitor treatment for MRSA infections. Performed at [facility], [place] [address], [place],  [address]      Discharge Instructions:   Discharge Instructions    Ambulatory referral to Neurology   Complete by:  As directed    An appointment is requested in approximately: 2-4 weeks re: dementia evaluation   Diet - low sodium heart healthy   Complete by:  As directed    Diet Carb Modified   Complete by:  As directed    Discharge instructions   Complete by:  As directed    Keep lower extremities elevated Referral to neurology for memory impairment   Increase activity slowly   Complete by:  As directed      Allergies as of 02/18/2018      Reactions   Orange Concentrate [flavoring Agent] Shortness Of Breath, Itching, Swelling   Peach [prunus Persica] Shortness Of Breath, Itching, Swelling   Penicillins Anaphylaxis, Hives   All 'cillins' Has patient had a PCN reaction causing immediate rash, facial/tongue/throat swelling, SOB or lightheadedness with hypotension: Yes Has patient had a PCN reaction causing severe rash involving mucus membranes or skin necrosis: No Has patient had a PCN reaction that required hospitalization No Has patient had a PCN reaction occurring within the last 10 years: No If all of the above answers are "NO", then may proceed with Cephalosporin use.   Strawberry Extract Shortness Of Breath, Itching, Swelling   Adhesive [tape] Itching, Rash   Please use "paper" tape   Aspirin Itching   Latex Itching, Rash   Other Hives, Itching, Rash   NO "-CILLINS"   Pineapple Itching, Rash   Strawberry Flavor Itching, Rash      Medication List    STOP taking these medications   HYDROcodone-acetaminophen 5-325 MG tablet Commonly known as:  NORCO/VICODIN   mupirocin ointment 2 % Commonly known as:  BACTROBAN     TAKE these medications   acetaminophen 325 MG tablet Commonly known as:  TYLENOL Take 2 tablets (650 mg total) by mouth every 6 (six) hours as needed for mild pain (or Fever >/= 101).   furosemide 40 MG  tablet Commonly known as:  LASIX Take 1 tablet (40 mg total) by mouth daily. What changed:    medication strength  how much to take   potassium chloride 10 MEQ tablet Commonly known as:  K-DUR,KLOR-CON Take 1 tablet (10 mEq total) by mouth daily.   pregabalin 50 MG capsule Commonly known as:  LYRICA Take 1 capsule (50 mg total) by mouth 2 (two) times daily.   traMADol 50 MG tablet Commonly known as:  ULTRAM Take 1 tablet (50 mg total) by mouth every 6 (six) hours as needed for moderate pain.      Contact information for after-discharge care  Destination    [facility] .   Service:  Skilled Nursing Contact information: [address] [place] [phone]               Time coordinating discharge: 35 min  Signed:  Geradine Girt  [facility] 02/18/2018, 2:17 PM

## 2018-02-18 NOTE — Clinical Social Work Note (Signed)
CSW facilitated patient discharge including contacting patient family (Sister. Unable to reach daughter. Message says phone is not in service/disconnected/number has been changed. Tried several times.) and facility to confirm patient discharge plans. Clinical information faxed to facility and family agreeable with plan. CSW arranged ambulance transport via [facility] to [facility]. RN to call report prior to discharge [phone] Room [phone]).  CSW will sign off for now as social work intervention is no longer needed. Please consult [place] again if new needs arise.  Dayton Scrape, [place]

## 2018-02-18 NOTE — Clinical Social Work Placement (Signed)
   CLINICAL SOCIAL WORK PLACEMENT  NOTE  Date:  02/18/2018  Patient Details  Name: [name] MRN: [id number] Date of Birth: 17-Jan-1946  Clinical Social Work is seeking post-discharge placement for this patient at the [place] level of care (*CSW will initial, date and re-position this form in  chart as items are completed):  Yes   Patient/family provided with [place] Work Department's list of facilities offering this level of care within the geographic area requested by the patient (or if unable, by the patient's family).  Yes   Patient/family informed of their freedom to choose among providers that offer the needed level of care, that participate in Medicare, Medicaid or managed care program needed by the patient, have an available bed and are willing to accept the patient.  Yes   Patient/family informed of [facility]'s ownership interest in [facility] and [facility], as well as of the fact that they are under no obligation to receive care at these facilities.  PASRR submitted to EDS on 02/17/18     PASRR number received on       Existing PASRR number confirmed on 02/17/18     FL2 transmitted to all facilities in geographic area requested by pt/family on 02/17/18     FL2 transmitted to all facilities within larger geographic area on       Patient informed that his/her managed care company has contracts with or will negotiate with certain facilities, including the following:        Yes   Patient/family informed of bed offers received.  Patient chooses bed at [facility] recommends and patient chooses bed at      Patient to be transferred to [facility] on 02/18/18.  Patient to be transferred to facility by [facility]     Patient family notified on 02/18/18 of transfer.  Name of family member notified:  Caffie Pinto     PHYSICIAN Please prepare prescriptions     Additional  Comment:    _______________________________________________ Candie Chroman, LCSW 02/18/2018, 2:50 PM

## 2018-02-18 NOTE — Clinical Social Work Note (Addendum)
Patient has insurance approval to discharge to [facility] when stable.  Dayton Scrape, [place] [phone]  11:32 am Patient notified of plan to discharge to [facility] today and is agreeable. Admissions coordinator has spoken with patient's daughter to arrange a time for paperwork.  Dayton Scrape, [place]

## 2018-02-19 ENCOUNTER — Encounter: Payer: Self-pay | Admitting: Internal Medicine

## 2018-02-19 ENCOUNTER — Non-Acute Institutional Stay (SKILLED_NURSING_FACILITY): Payer: Medicare Other | Admitting: Internal Medicine

## 2018-02-19 DIAGNOSIS — R4189 Other symptoms and signs involving cognitive functions and awareness: Secondary | ICD-10-CM | POA: Diagnosis not present

## 2018-02-19 DIAGNOSIS — R5381 Other malaise: Secondary | ICD-10-CM

## 2018-02-19 DIAGNOSIS — R609 Edema, unspecified: Secondary | ICD-10-CM | POA: Diagnosis not present

## 2018-02-19 DIAGNOSIS — D649 Anemia, unspecified: Secondary | ICD-10-CM | POA: Diagnosis not present

## 2018-02-19 DIAGNOSIS — R221 Localized swelling, mass and lump, neck: Secondary | ICD-10-CM

## 2018-02-19 NOTE — Patient Instructions (Signed)
See assessment and plan under each diagnosis in the problem list and acutely for this visit 

## 2018-02-19 NOTE — Assessment & Plan Note (Signed)
Continue Lasix diuresis and sodium restriction

## 2018-02-19 NOTE — Assessment & Plan Note (Addendum)
02/19/18 patient unaware of mass; initially documented 06/06/14 (45 months ago) suggesting benign etiology

## 2018-02-19 NOTE — Assessment & Plan Note (Signed)
History of falling  PT/OT and SNF

## 2018-02-19 NOTE — Progress Notes (Signed)
NURSING HOME LOCATION:  [facility] ROOM NUMBER:  [id number]  CODE STATUS:  Full Code  PCP:  Forrest Moron, MD  [place] [address]  This is a comprehensive admission note to [facility] performed on this date less than 30 days from date of admission. Included are preadmission medical/surgical history; reconciled medication list; family history; social history and comprehensive review of systems.  Corrections and additions to the records were documented. Comprehensive physical exam was also performed. Additionally a clinical summary was entered for each active diagnosis pertinent to this admission in the Problem List to enhance continuity of care.  HPI:  Patient was hospitalized 6/9-6/12/19 with progressive lower extremity edema over several weeks prior to admission. It had progressed to the point that was associated with weeping of fluid. The patient also had swelling of right knee in the context of a fall approximate 4 weeks prior to admission. She does have a baseline history of chronic swelling in the right knee. Venous Doppler was negative for DVT. Echocardiogram revealed grade 1 diastolic dysfunction. She received Lasix 20 mg IV every 12 hours with a good diuretic response. This was transitioned oral Lasix. Baseline memory impairment was thought to be present. Neurologic follow-up after discharge from SNF was recommended. On 6/12 chemistry & electrolytes were normal except for calcium 8.2. Microcytic, normochromic anemia was present with hemoglobin 9.7 and hematocrit 32. Iron was low normal at 34 and ferritin at 95.  Past medical and surgical history: Includes type 2 diabetes, severe protein-caloric malnutrition, polio remotely, chronic anemia, essential hypertension, history of pericarditis, and neurocognitive decline. Procedures and surgeries include open reduction and internal fixation of distal radial fracture and abdominal hysterectomy.  Social history:  Nondrinker; former smoker, quitting in 1970  Family history: Reviewed   Review of systems:  Could not be completed due to dementia. Date given as "September 19, 20". I found her in the hallway in a wheelchair looking for her granddaughter in another resident's room. Her granddaughter was not at the SNF. She does complain of stiffness of the right knee. She states she has high blood pressure chronically. She also describes back being "uncomfortable" with some numbness and tingling and weakness in her legs.  Constitutional: No fever, significant weight change, fatigue  Eyes: No redness, discharge, pain, vision change ENT/mouth: No nasal congestion, purulent discharge, earache, change in hearing, sore throat  Cardiovascular: No chest pain, palpitations, paroxysmal nocturnal dyspnea, claudication, edema  Respiratory: No cough, sputum production, hemoptysis, DOE, significant snoring, apnea Gastrointestinal: No heartburn, dysphagia, abdominal pain, nausea /vomiting, rectal bleeding, melena, change in bowels Genitourinary: No dysuria, hematuria, pyuria, incontinence, nocturia Dermatologic: No rash, pruritus, change in appearance of skin Neurologic: No dizziness, headache, syncope, seizures Psychiatric: No significant anxiety, depression, insomnia, anorexia Endocrine: No change in hair/skin/nails, excessive thirst, excessive hunger, excessive urination  Hematologic/lymphatic: No significant bruising, lymphadenopathy, abnormal bleeding Allergy/immunology: No itchy/watery eyes, significant sneezing, urticaria, angioedema  Physical exam:  Pertinent or positive findings: She has a mobile 3 x 3" subcutaneous mass at the right posterior mandible. The maxilla is edentulous. She has a short grade 1 blowing systolic murmur at the base. Pedal pulses are decreased. Knees are enlarged particularly medially, the right knee is enlarged more than the left. She has thickened, hyperpigmented sclerotic changes over the  shins. Initially asymmetric weakness especially in the lower extremities was suggested clinically but subsequently she was able to perform opposition maneuvers adequately.  General appearance: Adequately nourished; no acute distress, increased work  of breathing is present.   Lymphatic: No lymphadenopathy about the head, neck, axilla. Eyes: No conjunctival inflammation or lid edema is present. There is no scleral icterus. Ears:  External ear exam shows no significant lesions or deformities.   Nose:  External nasal examination shows no deformity or inflammation. Nasal mucosa are pink and moist without lesions, exudates Oral exam: Lips and gums are healthy appearing.There is no oropharyngeal erythema or exudate. Neck:  No thyromegaly, masses (except at posterior mandible as noted), tenderness noted.    Heart:  Normal rate and regular rhythm. S1 and S2 normal without gallop, click, rub.  Lungs: Chest clear to auscultation without wheezes, rhonchi, rales, rubs. Abdomen: Bowel sounds are normal.  Abdomen is soft and nontender with no organomegaly, hernias, masses. GU: Deferred  Extremities:  No cyanosis, clubbing, edema. Neurologic exam:  Balance, Rhomberg, finger to nose testing could not be completed due to clinical state Skin: Warm & dry w/o tenting. No significant rash.  See clinical summary under each active problem in the Problem List with associated updated therapeutic plan

## 2018-02-19 NOTE — Assessment & Plan Note (Addendum)
02/19/18 patient profoundly confused, hallucinating that her granddaughter is in the facility; unable to provide date or coherent history yet SLUMS score 42/30 SLUMS is a mental status assessment of possible dementia published by the [employer]. The test differentiates between high school or less education level in reference to presence or absence of dementia. For high school education as she has  a score of 27-30 is normal, 21-26 is minimal neurocognitive deficit and 1-22 suggests dementia. With less than a high school education similar scoring is 25-30, 20-24, and 1-19. Formal neurocognitive evaluation indicated post discharge from SNF

## 2018-02-21 NOTE — Assessment & Plan Note (Signed)
Monitor for bleeding dyscrasias. 

## 2018-02-26 LAB — HEMOGLOBIN A1C: Hemoglobin A1C: 5.2

## 2018-03-09 ENCOUNTER — Encounter: Payer: Self-pay | Admitting: Neurology

## 2018-03-09 ENCOUNTER — Ambulatory Visit (INDEPENDENT_AMBULATORY_CARE_PROVIDER_SITE_OTHER): Payer: Medicare Other | Admitting: Neurology

## 2018-03-09 ENCOUNTER — Telehealth: Payer: Self-pay | Admitting: Neurology

## 2018-03-09 VITALS — BP 136/80 | HR 71 | Ht 67.0 in

## 2018-03-09 DIAGNOSIS — G309 Alzheimer's disease, unspecified: Secondary | ICD-10-CM | POA: Diagnosis not present

## 2018-03-09 DIAGNOSIS — F0391 Unspecified dementia with behavioral disturbance: Secondary | ICD-10-CM

## 2018-03-09 DIAGNOSIS — R410 Disorientation, unspecified: Secondary | ICD-10-CM

## 2018-03-09 DIAGNOSIS — F03918 Unspecified dementia, unspecified severity, with other behavioral disturbance: Secondary | ICD-10-CM | POA: Insufficient documentation

## 2018-03-09 DIAGNOSIS — E538 Deficiency of other specified B group vitamins: Secondary | ICD-10-CM | POA: Diagnosis not present

## 2018-03-09 NOTE — Progress Notes (Addendum)
[facility]    Provider:  Dr Jaynee Eagles Referring Provider: Forrest Moron, MD Primary Care Physician:  Forrest Moron, MD  CC:  Memory impairment  HPI:  [name] is a 72 y.o. female here as a referral from Dr. Nolon Rod for memory impairment. PMHx chronic venous stasis, DM (off medications),obesity. Poor historian. Not here with family only with caregiver. She is currently in rehabilitation and lives with daughter and 41- year old granddaughter in [place].  She does not drive. Caretaker does not know here. She does not feel like she has a memory problem. She denies any memory issues. Caretaker feels like there are memory issues. Unclear onset or progression. Caretaker says she doesn't know the day or date. Forgets tings she same conversation, repeats the same stories, hallucination and confusion. There has been no contact with the daughter in the last 3 weeks as far as caretaker knows. Patient feels cared for at home. She feels she gets along well. SNF was unable to reach daughter to come today.  Daughter works and baby goes to a nursery. Caregiver provides most information. Unknown inciting events, start of symptoms due to dementia and poos historian.    Reviewed notes, labs and imaging from outside physicians, which showed: Bmp unremarkable, hgba1c 5.2, cbc with anemia  Review of Systems: Patient complains of symptoms per HPI as well as the following symptoms: blurred vision, easy bruising, confusion, numbness. Pertinent negatives and positives per HPI. All others negative.   Social History   Socioeconomic History  . Marital status: Widowed    Spouse name: Not on file  . Number of children: 2  . Years of education: 54  . Highest education level: Not on file  Occupational History  . Not on file  Social Needs  . Financial resource strain: Not on file  . Food insecurity:    Worry: Not on file    Inability: Not on file  . Transportation needs:   Medical: Not on file    Non-medical: Not on file  Tobacco Use  . Smoking status: Former Smoker    Packs/day: 0.50    Types: Cigarettes    Last attempt to quit: 09/09/1968    Years since quitting: 49.5  . Smokeless tobacco: Never Used  Substance and Sexual Activity  . Alcohol use: No  . Drug use: No  . Sexual activity: Never  Lifestyle  . Physical activity:    Days per week: Not on file    Minutes per session: Not on file  . Stress: Not on file  Relationships  . Social connections:    Talks on phone: Not on file    Gets together: Not on file    Attends religious service: Not on file    Active member of club or organization: Not on file    Attends meetings of clubs or organizations: Not on file    Relationship status: Not on file  . Intimate partner violence:    Fear of current or ex partner: Not on file    Emotionally abused: Not on file    Physically abused: Not on file    Forced sexual activity: Not on file  Other Topics Concern  . Not on file  Social History Narrative   Has family who helps her get around.     Fun: Read   Denies abuse and feel safe at home.    Family History  Problem Relation Age of Onset  . Ovarian cancer Mother   .  COPD Father   . Hypertension Unknown   . High Cholesterol Unknown   . Diabetes Mellitus II Neg Hx   . Lupus Neg Hx   . Sarcoidosis Neg Hx     Past Medical History:  Diagnosis Date  . Acute pericarditis, unspecified   . Arthritis   . Bilateral swelling of feet   . Cellulitis   . Chronic anemia   . Chronic knee pain    right  . Closed fracture of styloid process of ulna   . Cognitive decline   . Decreased appetite   . Diabetes mellitus    A1C has been normal for past 2 years and takes no diabetic meds at present  . Distal radius fracture 02/04/2016  . ECG abnormal   . Fall   . Hypertension   . Hypoglycemia   . Knee pain, chronic   . Left scapholunate ligament tear   . Lower extremity edema   . Mass of right side of  neck   . Microcytic anemia   . Polio   . Radius fracture   . Severe protein-calorie malnutrition ([place])   . Swelling of joint, knee, right   . Type 2 diabetes mellitus, controlled ([place])   . Venous insufficiency     Past Surgical History:  Procedure Laterality Date  . ABDOMINAL HYSTERECTOMY    . OPEN REDUCTION INTERNAL FIXATION (ORIF) DISTAL RADIAL FRACTURE Left 02/29/2016   Procedure: LEFT DISTAL RADIUS REPAIR OF MALUNION WITH OPEN REDUCTION INTERNAL FIXATION (ORIF);  Surgeon: Iran Planas, MD;  Location: [facility];  Service: Orthopedics;  Laterality: Left;    Current Outpatient Medications  Medication Sig Dispense Refill  . acetaminophen (TYLENOL) 325 MG tablet Take 2 tablets (650 mg total) by mouth every 6 (six) hours as needed for mild pain (or Fever >/= 101).    . furosemide (LASIX) 40 MG tablet Take 1 tablet (40 mg total) by mouth daily. 30 tablet   . potassium chloride (K-DUR,KLOR-CON) 10 MEQ tablet Take 1 tablet (10 mEq total) by mouth daily.    . pregabalin (LYRICA) 50 MG capsule Take 1 capsule (50 mg total) by mouth 2 (two) times daily. 60 capsule 0  . traMADol (ULTRAM) 50 MG tablet Take 1 tablet (50 mg total) by mouth every 6 (six) hours as needed for moderate pain. 10 tablet 0   No current facility-administered medications for this visit.     Allergies as of 03/09/2018 - Review Complete 03/09/2018  Allergen Reaction Noted  . Orange concentrate [employer] agent] Shortness Of Breath, Itching, and Swelling 02/04/2015  . Peach [prunus persica] Shortness Of Breath, Itching, and Swelling 06/06/2014  . Penicillins Anaphylaxis and Hives 04/07/2012  . Strawberry extract Shortness Of Breath, Itching, and Swelling 06/06/2014  . Adhesive [tape] Itching and Rash 02/04/2016  . Aspirin Itching 05/24/2015  . Latex Itching and Rash 02/04/2016  . Other Hives, Itching, and Rash 12/17/2016  . Pineapple Itching and Rash 06/06/2014  . Strawberry flavor Itching and Rash 02/04/2016     Vitals: BP 136/80   Pulse 71   Ht 5\' 7"  (1.702 m)   BMI 30.25 kg/m  Last Weight:  Wt Readings from Last 1 Encounters:  02/19/18 193 lb 1.9 oz (87.6 kg)   Last Height:   Ht Readings from Last 1 Encounters:  03/09/18 5\' 7"  (1.702 m)   Physical exam: Exam: Gen: NAD, conversant, well nourised, obese, well groomed  CV: RRR, no MRG. No Carotid Bruits. + peripheral edema, warm, nontender Eyes: Conjunctivae clear without exudates or hemorrhage  Neuro: Detailed Neurologic Exam  Speech:    Speech is normal; fluent and spontaneous with impaired comprehension.  Cognition:  MMSE - Mini Mental State Exam 03/09/2018  Orientation to time 0  Orientation to Place 1  Registration 3  Attention/ Calculation 0  Recall 0  Language- name 2 objects 2  Language- repeat 1  Language- follow 3 step command 2  Language- read & follow direction 1  Write a sentence 1  Copy design 0  Total score 11    Cranial Nerves:    The pupils are equal, round, and reactive to light. Attempted fundoscopic exam could not visualize. Visual fields are full to finger confrontation. Extraocular movements are intact. Trigeminal sensation is intact and the muscles of mastication are normal. The face is symmetric. The palate elevates in the midline. Hearing intact. Voice is normal. Shoulder shrug is normal. The tongue has normal motion without fasciculations.   Coordination:    No dysmetria  Gait:    attempred then deferred, fall risk  Motor Observation:    No asymmetry, no atrophy, and no involuntary movements noted. Tone:    Normal muscle tone.    Posture:    Posture is normal in wheelchair    Strength:    Strength is antigravity and equal, . Unable to perform thorough motor exam due to dementia     Sensation: intact to LT     Reflex Exam:  DTR's:    Deep tendon reflexes in the upper extremities and lower extremities are symmetrical bilaterally.   Toes:    The toes are equiv  bilaterally.   Clonus:    Clonus is absent.     Assessment/Plan:  72 y.o. female here as a referral from Dr. Nolon Rod for memory impairment. PMHx chronic venous stasis, DM (off medications),obesity, dementia. Poor historian, MMSE 11/30. Not here with family only with caregiver. She is currently in rehabilitation and lives with daughter and 64- year old granddaughter in [place].  She does not drive. Caretaker does not know her except for last few weeks in rehab  - Patient with obvious dementia. Will perform a workup including MRI brain and labs to evaluate for reversible causes of dementia  - May suggest starting Aricept after workup, need family member here to discuss. Family needs to be here at appointment, asked facility to have daughter schedule appointment  - Needs follow up with primary care who likely knows her better and her living situation  - We can see hr back in 4 months but needs family member with her or we cannot see her  - Education officer, museum at the facility needs t0 address the home situation and ensure she is discharged to a safe environment with adequate care given her dementia  - fall risk : needs PT/OT  - follow up with pcp in 2-4 weeks who needs to also address living situation and safety at home  - My complete note provided with patient sign out instructions to caregiver instructed to provide to needed employees including social work at facility.   Orders Placed This Encounter  Procedures  . MR BRAIN WO CONTRAST  . B12 and Folate Panel  . Methylmalonic acid, serum  . HIV antibody  . RPR  . TSH      Sarina Ill, MD  [facility] [address] [place], [place] [address]  Phone [phone] Fax  [phone]  

## 2018-03-09 NOTE — Patient Instructions (Signed)
- Patient with obvious dementia. Will perform a workup including MRI brain and labs  - May suggest starting Aricept after workup, need family member here to discuss. Family needs to be here at appointment, asked facility to have daughter schedule appointment  - Needs follow up with primary care who likely knows her better and her living situation  - We can see hr back in 4 months but needs family member with her or we cannot see her  - Education officer, museum at the facility needs t0 address the home situation and ensure she is discharged to a safe environment with adequate care given her dementia  - fall risk : needs PT/OT  - follow up with pcp in 2-4 weeks who needs to also address living situation and safety at home   Dementia Dementia is the loss of two or more brain functions, such as:  Memory.  Decision making.  Behavior.  Speaking.  Thinking.  Problem solving.  There are many types of dementia. The most common type is called progressive dementia. Progressive dementia gets worse with time and it is irreversible. An example of this type of dementia is Alzheimer disease. What are the causes? This condition may be caused by:  Nerve cell damage in the brain.  Genetic mutations.  Certain medicines.  Multiple small strokes.  An infection, such as chronic meningitis.  A metabolic problem, such as vitamin B12 deficiency or thyroid disease.  Pressure on the brain, such as from a tumor or blood clot.  What are the signs or symptoms? Symptoms of this condition include:  Sudden changes in mood.  Depression.  Problems with balance.  Changes in personality.  Poor short-term memory.  Agitation.  Delusions.  Hallucinations.  Having a hard time: ? Speaking thoughts. ? Finding words. ? Solving problems. ? Doing familiar tasks. ? Understanding familiar ideas.  How is this diagnosed? This condition is diagnosed with an assessment by your health care provider. During  this assessment, your health care provider will talk with you and your family, friends, or caregivers about your symptoms. A thorough medical history will be taken, and you will have a physical exam and tests. Tests may include:  Lab tests, such as blood or urine tests.  Imaging tests, such as a CT scan, PET scan, or MRI.  A lumbar puncture. This test involves removing and testing a small amount of the fluid that surrounds the brain and spinal cord.  An electroencephalogram (EEG). In this test, small metal discs are used to measure electrical activity in the brain.  Memory tests, cognitive tests, and neuropsychological tests. These tests evaluate brain function.  How is this treated? Treatment depends on the cause of the dementia. It may involve taking medicines that may help:  To control the dementia.  To slow down the disease.  To manage symptoms.  In some cases, treating the cause of the dementia can improve symptoms, reverse symptoms, or slow down how quickly the dementia gets worse. Your health care provider can help direct you to support groups, organizations, and other health care providers who can help with decisions about your care. Follow these instructions at home: Medicine  Take over-the-counter and prescription medicines only as told by your health care provider.  Avoid taking medicines that can affect thinking, such as pain or sleeping medicines. Lifestyle   Make healthy lifestyle choices: ? Be physically active as told by your health care provider. ? Do not use any tobacco products, such as cigarettes, chewing tobacco, and  e-cigarettes. If you need help quitting, ask your health care provider. ? Eat a healthy diet. ? Practice stress-management techniques when you get stressed. ? Stay social.  Drink enough fluid to keep your urine clear or pale yellow.  Make sure to get quality sleep. These tips can help you to get a good night's rest: ? Avoid napping during  the day. ? Keep your sleeping area dark and cool. ? Avoid exercising during the few hours before you go to bed. ? Avoid caffeine products in the evening. General instructions  Work with your health care provider to determine what you need help with and what your safety needs are.  If you were given a bracelet that tracks your location, make sure to wear it.  Keep all follow-up visits as told by your health care provider. This is important. Contact a health care provider if:  You have any new symptoms.  You have problems with choking or swallowing.  You have any symptoms of a different illness. Get help right away if:  You develop a fever.  You have new or worsening confusion.  You have new or worsening sleepiness.  You have a hard time staying awake.  You or your family members become concerned for your safety. This information is not intended to replace advice given to you by your health care provider. Make sure you discuss any questions you have with your health care provider. Document Released: 02/19/2001 Document Revised: 01/04/2016 Document Reviewed: 05/24/2015 Elsevier Interactive Patient Education  [employer].

## 2018-03-09 NOTE — Telephone Encounter (Signed)
[facility] Medicare order sent to [facility]. No auth and they will reach out to the pt to schedule.

## 2018-03-13 LAB — METHYLMALONIC ACID, SERUM: METHYLMALONIC ACID: 282 nmol/L (ref 0–378)

## 2018-03-13 LAB — HIV ANTIBODY (ROUTINE TESTING W REFLEX): HIV SCREEN 4TH GENERATION: NONREACTIVE

## 2018-03-13 LAB — B12 AND FOLATE PANEL
FOLATE: 11.9 ng/mL (ref >3.0)
VITAMIN B 12: 378 pg/mL (ref 232–1245)

## 2018-03-13 LAB — TSH: TSH: 0.295 u[IU]/mL — ABNORMAL LOW (ref 0.450–4.500)

## 2018-03-13 LAB — RPR: RPR Ser Ql: NONREACTIVE

## 2018-03-16 ENCOUNTER — Telehealth: Payer: Self-pay | Admitting: *Deleted

## 2018-03-16 NOTE — Telephone Encounter (Signed)
-----   Message from Penni Bombard, MD sent at 03/12/2018 12:53 AM EDT ----- Unremarkable labs except low TSH. Follow up with PCP. Please call patient. -VRP

## 2018-03-16 NOTE — Telephone Encounter (Signed)
I spoke to pts sister, Arville Go (ok per DPR) that her lab results were unremarkable except for low TSH, pt needs to f/u with pcp.  She verbalized that her sister in [place] at [place] and to let them know.  I will fax lab results to them. [phone]  Lorelle Gibbs. [phone].  Received fax confirmation.

## 2018-03-23 ENCOUNTER — Telehealth: Payer: Self-pay

## 2018-03-23 ENCOUNTER — Telehealth: Payer: Self-pay | Admitting: Family Medicine

## 2018-03-23 NOTE — Telephone Encounter (Signed)
Copied from [place] [id number]. Topic: Quick Communication - See Telephone Encounter >> Mar 23, 2018  9:38 AM Ahmed Prima L wrote: CRM for notification. See Telephone encounter for: 03/23/18.  Patient's sister Yvone Neu ) said that she needs a copy of her condition for social security. It needs to be from Dr Nolon Rod. Please call when ready for pick up. Patient is currently in a rehab facility right now.

## 2018-03-23 NOTE — Telephone Encounter (Signed)
Please notify the patient's sister of the letter

## 2018-03-23 NOTE — Telephone Encounter (Signed)
Contacted [name] and advised letter ready for pickup.  She will come to [address] to p/u letter.   Please disregard prior telephone message that letter was sent via mail to pt home- it will not be mailed but p/u by [name]. Dgaddy, CMA

## 2018-03-23 NOTE — Telephone Encounter (Signed)
Pt lab letter sent via mail to pt home address. Dgaddy, CMA

## 2018-03-23 NOTE — Telephone Encounter (Signed)
Spoke with Ms. [name] and advised letter ready for p/u at [address].  Ms. [name] agreeable and will p/u today. Dgaddy, CMA

## 2018-03-25 NOTE — Telephone Encounter (Addendum)
Social Security will not accept letter, needs to states mental functions such as paying bills and house hold chores. Call back [phone]

## 2018-03-26 ENCOUNTER — Telehealth: Payer: Self-pay

## 2018-03-26 NOTE — Telephone Encounter (Signed)
Left message on voicemail new letter completed for Social Security.  Advised letter at [address] and to call office if letter doesn't suffice for SS Administration.  Dgaddy, CMA

## 2018-03-26 NOTE — Telephone Encounter (Signed)
Please notify the patient that the form is ready for pick up

## 2018-03-26 NOTE — Telephone Encounter (Signed)
Dr Nolon Rod advise please pt letter needs to address her mental functions as she is unable to pays etc.

## 2018-03-27 NOTE — Telephone Encounter (Signed)
Lm on vm on 03/26/18 letter ready for p/u at [address] [facility] c/o Ms. Meadow. Dgaddy, CMA

## 2018-04-15 ENCOUNTER — Other Ambulatory Visit: Payer: Self-pay | Admitting: Family Medicine

## 2018-04-15 NOTE — Telephone Encounter (Signed)
furosemide refill Last Refill:? Last OV: 01/19/18 PCP: Delia Chimes Pharmacy: [facility] [address] [place], [place]  Pt previously taking 20 mg daily per Dr Nolon Rod

## 2018-04-15 NOTE — Telephone Encounter (Signed)
Refill of Lyrica  LRF 01/19/18  #60  0 refills  LOV 01/19/18 Dr. Nolon Rod  [id number] - [place], [place]

## 2018-04-22 MED ORDER — FUROSEMIDE 40 MG PO TABS: 40.0000 mg | ORAL_TABLET | Freq: Every day | ORAL | 0 refills | Status: DC

## 2018-04-22 NOTE — Addendum Note (Signed)
Addended by: Delia Chimes A on: 04/22/2018 02:38 PM   Modules accepted: Orders

## 2018-04-22 NOTE — Telephone Encounter (Signed)
Please let the patient know that she will need to come in for labs and an office visit . She has to come in with a family member.

## 2018-04-22 NOTE — Telephone Encounter (Signed)
Dr Bridget Hartshorn please advise, medication discontinued by another provider.

## 2018-04-27 ENCOUNTER — Other Ambulatory Visit: Payer: Self-pay

## 2018-04-27 ENCOUNTER — Encounter: Payer: Self-pay | Admitting: Family Medicine

## 2018-04-27 ENCOUNTER — Ambulatory Visit (INDEPENDENT_AMBULATORY_CARE_PROVIDER_SITE_OTHER): Payer: Medicare Other | Admitting: Family Medicine

## 2018-04-27 VITALS — BP 127/82 | HR 86 | Temp 99.0°F | Resp 17 | Ht 67.0 in | Wt 196.6 lb

## 2018-04-27 DIAGNOSIS — Z658 Other specified problems related to psychosocial circumstances: Secondary | ICD-10-CM

## 2018-04-27 DIAGNOSIS — Z789 Other specified health status: Secondary | ICD-10-CM

## 2018-04-27 DIAGNOSIS — Z7409 Other reduced mobility: Secondary | ICD-10-CM | POA: Diagnosis not present

## 2018-04-27 DIAGNOSIS — I872 Venous insufficiency (chronic) (peripheral): Secondary | ICD-10-CM | POA: Diagnosis not present

## 2018-04-27 DIAGNOSIS — F0391 Unspecified dementia with behavioral disturbance: Secondary | ICD-10-CM | POA: Diagnosis not present

## 2018-04-27 DIAGNOSIS — N3945 Continuous leakage: Secondary | ICD-10-CM

## 2018-04-27 DIAGNOSIS — R220 Localized swelling, mass and lump, head: Secondary | ICD-10-CM

## 2018-04-27 MED ORDER — PREGABALIN 50 MG PO CAPS: ORAL_CAPSULE | ORAL | 3 refills | Status: DC

## 2018-04-27 NOTE — Progress Notes (Signed)
Chief Complaint  Patient presents with  . 3 month f/u    leg swelling getting worse, only giving furosemide and calcium.  Pain level 8/10.  Pt daughter needing to get FMLA  so she can care for mom and bring to doctor visits.    HPI  She has a history of diabetes as well as vascular insufficiency with severe pain and severe edema.  She is on a regimen of lasix and compression stocking. She is taking potassium with her Lasix 40mg  daily She has severe swelling of the lower extremity with venous stasis She would rate her pain as 8/10   She has incontinence of urine and wear adult diapers Her daughter needs prescriptions for this She denies rash or foul smelling urine or vaginal discharge   She is here with her daughter who needs an FMLA form completed so she can take her mother to the doctor   Past Medical History:  Diagnosis Date  . Acute pericarditis, unspecified   . Arthritis   . Bilateral swelling of feet   . Cellulitis   . Chronic anemia   . Chronic knee pain    right  . Closed fracture of styloid process of ulna   . Cognitive decline   . Decreased appetite   . Diabetes mellitus    A1C has been normal for past 2 years and takes no diabetic meds at present  . Distal radius fracture 02/04/2016  . ECG abnormal   . Fall   . Hypertension   . Hypoglycemia   . Knee pain, chronic   . Left scapholunate ligament tear   . Lower extremity edema   . Mass of right side of neck   . Microcytic anemia   . Polio   . Radius fracture   . Severe protein-calorie malnutrition ([place])   . Swelling of joint, knee, right   . Type 2 diabetes mellitus, controlled ([place])   . Venous insufficiency     Current Outpatient Medications  Medication Sig Dispense Refill  . furosemide (LASIX) 40 MG tablet Take 1 tablet (40 mg total) by mouth daily. Needs office visit. 90 tablet 0  . potassium chloride (K-DUR,KLOR-CON) 10 MEQ tablet Take 1 tablet (10 mEq total) by mouth daily.    Marland Kitchen acetaminophen  (TYLENOL) 325 MG tablet Take 2 tablets (650 mg total) by mouth every 6 (six) hours as needed for mild pain (or Fever >/= 101). (Patient not taking: Reported on 04/27/2018)    . pregabalin (LYRICA) 50 MG capsule TAKE 1 CAPSULE (50 MG TOTAL) BY MOUTH 2 (TWO) TIMES DAILY. 60 capsule 3  . traMADol (ULTRAM) 50 MG tablet Take 1 tablet (50 mg total) by mouth every 6 (six) hours as needed for moderate pain. (Patient not taking: Reported on 04/27/2018) 10 tablet 0   No current facility-administered medications for this visit.     Allergies:  Allergies  Allergen Reactions  . Orange Concentrate [Flavoring Agent] Shortness Of Breath, Itching and Swelling  . Peach [Prunus Persica] Shortness Of Breath, Itching and Swelling  . Penicillins Anaphylaxis and Hives    All 'cillins' Has patient had a PCN reaction causing immediate rash, facial/tongue/throat swelling, SOB or lightheadedness with hypotension: Yes Has patient had a PCN reaction causing severe rash involving mucus membranes or skin necrosis: No Has patient had a PCN reaction that required hospitalization No Has patient had a PCN reaction occurring within the last 10 years: No If all of the above answers are "NO", then may proceed with  Cephalosporin use.   . Strawberry Extract Shortness Of Breath, Itching and Swelling  . Adhesive [Tape] Itching and Rash    Please use "paper" tape  . Aspirin Itching  . Latex Itching and Rash  . Other Hives, Itching and Rash    NO "-CILLINS"  . Pineapple Itching and Rash  . Strawberry Flavor Itching and Rash    Past Surgical History:  Procedure Laterality Date  . ABDOMINAL HYSTERECTOMY    . OPEN REDUCTION INTERNAL FIXATION (ORIF) DISTAL RADIAL FRACTURE Left 02/29/2016   Procedure: LEFT DISTAL RADIUS REPAIR OF MALUNION WITH OPEN REDUCTION INTERNAL FIXATION (ORIF);  Surgeon: Iran Planas, MD;  Location: [facility];  Service: Orthopedics;  Laterality: Left;    Social History   Socioeconomic History  . Marital  status: Widowed    Spouse name: Not on file  . Number of children: 2  . Years of education: 43  . Highest education level: Not on file  Occupational History  . Not on file  Social Needs  . Financial resource strain: Not on file  . Food insecurity:    Worry: Not on file    Inability: Not on file  . Transportation needs:    Medical: Not on file    Non-medical: Not on file  Tobacco Use  . Smoking status: Former Smoker    Packs/day: 0.50    Types: Cigarettes    Last attempt to quit: 09/09/1968    Years since quitting: 49.7  . Smokeless tobacco: Never Used  Substance and Sexual Activity  . Alcohol use: No  . Drug use: No  . Sexual activity: Never  Lifestyle  . Physical activity:    Days per week: Not on file    Minutes per session: Not on file  . Stress: Not on file  Relationships  . Social connections:    Talks on phone: Not on file    Gets together: Not on file    Attends religious service: Not on file    Active member of club or organization: Not on file    Attends meetings of clubs or organizations: Not on file    Relationship status: Not on file  Other Topics Concern  . Not on file  Social History Narrative   Has family who helps her get around.     Fun: Read   Denies abuse and feel safe at home.    Family History  Problem Relation Age of Onset  . Ovarian cancer Mother   . COPD Father   . Hypertension Unknown   . High Cholesterol Unknown   . Diabetes Mellitus II Neg Hx   . Lupus Neg Hx   . Sarcoidosis Neg Hx      ROS Review of Systems See HPI Constitution: No fevers or chills No malaise No diaphoresis Skin: No rash or itching Eyes: no blurry vision, no double vision GU: no dysuria or hematuria Neuro: no dizziness or headaches  all others reviewed and negative   Objective: Vitals:   04/27/18 1350  BP: 127/82  Pulse: 86  Resp: 17  Temp: 99 F (37.2 C)  TempSrc: Oral  SpO2: 98%  Weight: 196 lb 9.6 oz (89.2 kg)  Height: 5\' 7"  (1.702 m)     Physical Exam  Constitutional: She is oriented to person, place, and time. She appears well-developed and well-nourished.  HENT:  Head: Normocephalic and atraumatic.  Right Ear: External ear normal.  Left Ear: External ear normal.  Submental mass, no fluctuance  Eyes: Conjunctivae  and EOM are normal.  Cardiovascular: Normal rate, regular rhythm and normal heart sounds.  Pulmonary/Chest: Effort normal and breath sounds normal. No respiratory distress.  Musculoskeletal:  Severe lower extremity edema  Neurological: She is alert and oriented to person, place, and time.      Assessment and Plan Alpa was seen today for 3 month f/u.  Diagnoses and all orders for this visit:  Venous insufficiency of both lower extremities, chronic- continue lasix with potassium, compression stockings and elevating lower extremities -     Compression stockings  Dementia with behavioral disturbance, unspecified dementia type- completed FMLA for patient's daughter  Decreased mobility and endurance -     DME Other see comment  Decreased independence with activities of daily living  Facial mass- uncertain etiology  Will refer to ENT -     Ambulatory referral to ENT  Continuous leakage of urine  -     DME Other see comment  Other orders -     pregabalin (LYRICA) 50 MG capsule; TAKE 1 CAPSULE (50 MG TOTAL) BY MOUTH 2 (TWO) TIMES DAILY.     [place]

## 2018-04-27 NOTE — Patient Instructions (Addendum)
If you have lab work done today you will be contacted with your lab results within the next 2 weeks.  If you have not heard from [place] then please contact us. The fastest way to get your results is to register for My Chart.   IF you received an x-ray today, you will receive an invoice from [facility]. Please contact [facility] at [phone] with questions or concerns regarding your invoice.   IF you received labwork today, you will receive an invoice from [facility]. Please contact [facility] at [phone] with questions or concerns regarding your invoice.   Our billing staff will not be able to assist you with questions regarding bills from these companies.  You will be contacted with the lab results as soon as they are available. The fastest way to get your results is to activate your My Chart account. Instructions are located on the last page of this paperwork. If you have not heard from [place] regarding the results in 2 weeks, please contact this office.     Chronic Venous Insufficiency Chronic venous insufficiency, also called venous stasis, is a condition that prevents blood from being pumped effectively through the veins in your legs. Blood may no longer be pumped effectively from the legs back to the heart. This condition can range from mild to severe. With proper treatment, you should be able to continue with an active life. What are the causes? Chronic venous insufficiency occurs when the vein walls become stretched, weakened, or damaged, or when valves within the vein are damaged. Some common causes of this include:  High blood pressure inside the veins (venous hypertension).  Increased blood pressure in the leg veins from long periods of sitting or standing.  A blood clot that blocks blood flow in a vein (deep vein thrombosis, DVT).  Inflammation of a vein (phlebitis) that causes a blood clot to form.  Tumors in the pelvis that cause blood to back  up.  What increases the risk? The following factors may make you more likely to develop this condition:  Having a family history of this condition.  Obesity.  Pregnancy.  Living without enough physical activity or exercise (sedentary lifestyle).  Smoking.  Having a job that requires long periods of standing or sitting in one place.  Being a certain age. Women in their 61s and 49s and men in their 56s are more likely to develop this condition.  What are the signs or symptoms? Symptoms of this condition include:  Veins that are enlarged, bulging, or twisted (varicose veins).  Skin breakdown or ulcers.  Reddened or discolored skin on the front of the leg.  Brown, smooth, tight, and painful skin just above the ankle, usually on the inside of the leg (lipodermatosclerosis).  Swelling.  How is this diagnosed? This condition may be diagnosed based on:  Your medical history.  A physical exam.  Tests, such as: ? A procedure that creates an image of a blood vessel and nearby organs and provides information about blood flow through the blood vessel (duplex ultrasound). ? A procedure that tests blood flow (plethysmography). ? A procedure to look at the veins using X-ray and dye (venogram).  How is this treated? The goals of treatment are to help you return to an active life and to minimize pain or disability. Treatment depends on the severity of your condition, and it may include:  Wearing compression stockings. These can help relieve symptoms and help prevent your condition from getting worse.  However, they do not cure the condition.  Sclerotherapy. This is a procedure involving an injection of a material that "dissolves" damaged veins.  Surgery. This may involve: ? Removing a diseased vein (vein stripping). ? Cutting off blood flow through the vein (laser ablation surgery). ? Repairing a valve.  Follow these instructions at home:  Wear compression stockings as told by  your health care provider. These stockings help to prevent blood clots and reduce swelling in your legs.  Take over-the-counter and prescription medicines only as told by your health care provider.  Stay active by exercising, walking, or doing different activities. Ask your health care provider what activities are safe for you and how much exercise you need.  Drink enough fluid to keep your urine clear or pale yellow.  Do not use any products that contain nicotine or tobacco, such as cigarettes and e-cigarettes. If you need help quitting, ask your health care provider.  Keep all follow-up visits as told by your health care provider. This is important. Contact a health care provider if:  You have redness, swelling, or more pain in the affected area.  You see a red streak or line that extends up or down from the affected area.  You have skin breakdown or a loss of skin in the affected area, even if the breakdown is small.  You get an injury in the affected area. Get help right away if:  You get an injury and an open wound in the affected area.  You have severe pain that does not get better with medicine.  You have sudden numbness or weakness in the foot or ankle below the affected area, or you have trouble moving your foot or ankle.  You have a fever and you have worse or persistent symptoms.  You have chest pain.  You have shortness of breath. Summary  Chronic venous insufficiency, also called venous stasis, is a condition that prevents blood from being pumped effectively through the veins in your legs.  Chronic venous insufficiency occurs when the vein walls become stretched, weakened, or damaged, or when valves within the vein are damaged.  Treatment for this condition depends on how severe your condition is, and it may involve wearing compression stockings or having a procedure.  Make sure you stay active by exercising, walking, or doing different activities. Ask your  health care provider what activities are safe for you and how much exercise you need. This information is not intended to replace advice given to you by your health care provider. Make sure you discuss any questions you have with your health care provider. Document Released: 12/30/2006 Document Revised: 07/15/2016 Document Reviewed: 07/15/2016 Elsevier Interactive Patient Education  2017 [employer].

## 2018-05-04 ENCOUNTER — Telehealth: Payer: Self-pay | Admitting: Family Medicine

## 2018-05-04 NOTE — Telephone Encounter (Signed)
Verbal given 

## 2018-05-04 NOTE — Telephone Encounter (Unsigned)
Copied from [place] [id number]. Topic: Quick Communication - See Telephone Encounter >> May 04, 2018  9:56 AM Neva Seat wrote: Wells Guiles w/ [facility] - [phone] - cell  / [phone] - office  Gave Speech Evaluation on Mon 04-27-18  Verbal Orders: Speech Therapy 2 times a  week for 5 weeks

## 2018-05-14 DIAGNOSIS — Z0271 Encounter for disability determination: Secondary | ICD-10-CM

## 2018-05-29 ENCOUNTER — Telehealth: Payer: Self-pay | Admitting: Family Medicine

## 2018-05-29 NOTE — Telephone Encounter (Signed)
Copied from [place] [id number]. Topic: Inquiry >> May 28, 2018  5:00 PM Oliver Pila B wrote: Reason for CRM: [facility] called to notify pcp that pt has met they're goals and she is being discharged; contact [phone] if needed

## 2018-06-24 ENCOUNTER — Other Ambulatory Visit: Payer: Self-pay

## 2018-06-24 DIAGNOSIS — I83891 Varicose veins of right lower extremities with other complications: Secondary | ICD-10-CM

## 2018-07-09 ENCOUNTER — Other Ambulatory Visit: Payer: Self-pay | Admitting: Family Medicine

## 2018-07-09 MED ORDER — FUROSEMIDE 40 MG PO TABS: 40.0000 mg | ORAL_TABLET | Freq: Every day | ORAL | 0 refills | Status: DC

## 2018-07-09 NOTE — Telephone Encounter (Signed)
Copied from [place]. Topic: Quick Communication - Rx Refill/Question >> Jul 09, 2018 11:01 AM Virl Axe D wrote: Medication: furosemide (LASIX) 40 MG tablet / potassium chloride (K-DUR,KLOR-CON) 10 MEQ tablet  Patient has been out for 2 weeks. Daughter states Pharmacy has been sending over refill requests with no response.  Has the patient contacted their pharmacy? Yes.   (Agent: If no, request that the patient contact the pharmacy for the refill.) (Agent: If yes, when and what did the pharmacy advise?)  Preferred Pharmacy (with phone number or street name): [facility] [phone] - [place], [address] AT [address] [phone] (Phone) [phone] (Fax)    Agent: Please be advised that RX refills may take up to 3 business days. We ask that you follow-up with your pharmacy.

## 2018-07-28 ENCOUNTER — Encounter: Payer: Self-pay | Admitting: Family Medicine

## 2018-07-28 ENCOUNTER — Other Ambulatory Visit: Payer: Self-pay

## 2018-07-28 ENCOUNTER — Ambulatory Visit (INDEPENDENT_AMBULATORY_CARE_PROVIDER_SITE_OTHER): Payer: Medicare Other | Admitting: Family Medicine

## 2018-07-28 VITALS — BP 138/72 | HR 63 | Temp 98.5°F | Resp 14 | Ht 67.0 in | Wt 200.6 lb

## 2018-07-28 DIAGNOSIS — Z658 Other specified problems related to psychosocial circumstances: Secondary | ICD-10-CM | POA: Diagnosis not present

## 2018-07-28 DIAGNOSIS — J301 Allergic rhinitis due to pollen: Secondary | ICD-10-CM

## 2018-07-28 DIAGNOSIS — R220 Localized swelling, mass and lump, head: Secondary | ICD-10-CM

## 2018-07-28 DIAGNOSIS — I872 Venous insufficiency (chronic) (peripheral): Secondary | ICD-10-CM

## 2018-07-28 DIAGNOSIS — Z789 Other specified health status: Secondary | ICD-10-CM

## 2018-07-28 MED ORDER — TRAMADOL HCL 50 MG PO TABS: 50.0000 mg | ORAL_TABLET | Freq: Four times a day (QID) | ORAL | 0 refills | Status: DC | PRN

## 2018-07-28 MED ORDER — POTASSIUM CHLORIDE CRYS ER 10 MEQ PO TBCR: 10.0000 meq | EXTENDED_RELEASE_TABLET | Freq: Every day | ORAL | Status: DC

## 2018-07-28 MED ORDER — FUROSEMIDE 40 MG PO TABS: 40.0000 mg | ORAL_TABLET | Freq: Every day | ORAL | 0 refills | Status: DC

## 2018-07-28 MED ORDER — TRIAMCINOLONE ACETONIDE 0.1 % EX CREA: 1.0000 "application " | TOPICAL_CREAM | Freq: Two times a day (BID) | CUTANEOUS | 0 refills | Status: DC

## 2018-07-28 MED ORDER — GABAPENTIN 300 MG PO CAPS: 300.0000 mg | ORAL_CAPSULE | Freq: Three times a day (TID) | ORAL | 0 refills | Status: DC

## 2018-07-28 MED ORDER — CETIRIZINE HCL 10 MG PO TABS: 10.0000 mg | ORAL_TABLET | Freq: Every day | ORAL | 11 refills | Status: DC

## 2018-07-28 NOTE — Patient Instructions (Signed)
° ° ° °  If you have lab work done today you will be contacted with your lab results within the next 2 weeks.  If you have not heard from us then please contact us. The fastest way to get your results is to register for My Chart. ° ° °IF you received an x-ray today, you will receive an invoice from [facility]. Please contact [facility] at [phone] with questions or concerns regarding your invoice.  ° °IF you received labwork today, you will receive an invoice from [facility]. Please contact [facility] at [phone] with questions or concerns regarding your invoice.  ° °Our billing staff will not be able to assist you with questions regarding bills from these companies. ° °You will be contacted with the lab results as soon as they are available. The fastest way to get your results is to activate your My Chart account. Instructions are located on the last page of this paperwork. If you have not heard from us regarding the results in 2 weeks, please contact this office. °  ° ° ° °

## 2018-07-28 NOTE — Progress Notes (Signed)
Chief Complaint  Patient presents with  . Follow-up    Venous insufficiency both lower extremities, has an appt with Vein specialist 07/31/2018 Home therapy stopped coming out till patient get a knee replacement  . Foot Swelling    knee and foot swelling and feet itching. Stopped the lyrica due to the rash on the right leg area. Once stopped the lyrica the area started to dry up a little  . Medication Refill    need refills on all meds  . Sinusitis    need something for the sinus    HPI  Chronic venous insufficiency She will be going to a vein specialist on 07/31/2018 She does not keep her legs propped up when she is sitting and does not wear her compression stockings She was brought in by her daughter who is not her POA. Her daughter states that she was having "sores from the lyrica so she stopped taking it".  When asked to describe the sores the patient states that "my knee was so swollen that it popped and was oozing and was just weeping. But then it dried up once I stopped the lyrica and it is scabbing over now".  She is wondering what else she can take for the chronic pain, itching and tingling she gets in her legs. She is taking the furosemide. She also needs refills on her medications   Patient reports that she has not followed up for her right facial mass due to the copay cost. She did not have the money She denies any pain with swallowing or changes in size of the lesion  Postnasal drip She gets postnasal drip and drainage especially at night It makes her throat sore She thinks it is allergies but did not want to take any otc antihistamine because she was always told to check with her doctor before taking otc meds  Past Medical History:  Diagnosis Date  . Acute pericarditis, unspecified   . Arthritis   . Bilateral swelling of feet   . Cellulitis   . Chronic anemia   . Chronic knee pain    right  . Closed fracture of styloid process of ulna   . Cognitive decline   .  Decreased appetite   . Diabetes mellitus    A1C has been normal for past 2 years and takes no diabetic meds at present  . Distal radius fracture 02/04/2016  . ECG abnormal   . Fall   . Hypertension   . Hypoglycemia   . Knee pain, chronic   . Left scapholunate ligament tear   . Lower extremity edema   . Mass of right side of neck   . Microcytic anemia   . Polio   . Radius fracture   . Severe protein-calorie malnutrition ([place])   . Swelling of joint, knee, right   . Type 2 diabetes mellitus, controlled ([place])   . Venous insufficiency     Current Outpatient Medications  Medication Sig Dispense Refill  . acetaminophen (TYLENOL) 325 MG tablet Take 2 tablets (650 mg total) by mouth every 6 (six) hours as needed for mild pain (or Fever >/= 101).    . furosemide (LASIX) 40 MG tablet Take 1 tablet (40 mg total) by mouth daily. Needs office visit. 90 tablet 0  . potassium chloride (K-DUR,KLOR-CON) 10 MEQ tablet Take 1 tablet (10 mEq total) by mouth daily.    . traMADol (ULTRAM) 50 MG tablet Take 1 tablet (50 mg total) by mouth every 6 (six) hours  as needed for moderate pain. 10 tablet 0  . cetirizine (ZYRTEC) 10 MG tablet Take 1 tablet (10 mg total) by mouth daily. 30 tablet 11  . gabapentin (NEURONTIN) 300 MG capsule Take 1 capsule (300 mg total) by mouth 3 (three) times daily. 90 capsule 0  . triamcinolone cream (KENALOG) 0.1 % Apply 1 application topically 2 (two) times daily. 80 g 0   No current facility-administered medications for this visit.     Allergies:  Allergies  Allergen Reactions  . Orange Concentrate [Flavoring Agent] Shortness Of Breath, Itching and Swelling  . Peach [Prunus Persica] Shortness Of Breath, Itching and Swelling  . Penicillins Anaphylaxis and Hives    All 'cillins' Has patient had a PCN reaction causing immediate rash, facial/tongue/throat swelling, SOB or lightheadedness with hypotension: Yes Has patient had a PCN reaction causing severe rash involving mucus  membranes or skin necrosis: No Has patient had a PCN reaction that required hospitalization No Has patient had a PCN reaction occurring within the last 10 years: No If all of the above answers are "NO", then may proceed with Cephalosporin use.   . Strawberry Extract Shortness Of Breath, Itching and Swelling  . Adhesive [Tape] Itching and Rash    Please use "paper" tape  . Aspirin Itching  . Latex Itching and Rash  . Other Hives, Itching and Rash    NO "-CILLINS"  . Pineapple Itching and Rash  . Strawberry Flavor Itching and Rash    Past Surgical History:  Procedure Laterality Date  . ABDOMINAL HYSTERECTOMY    . OPEN REDUCTION INTERNAL FIXATION (ORIF) DISTAL RADIAL FRACTURE Left 02/29/2016   Procedure: LEFT DISTAL RADIUS REPAIR OF MALUNION WITH OPEN REDUCTION INTERNAL FIXATION (ORIF);  Surgeon: Iran Planas, MD;  Location: [facility];  Service: Orthopedics;  Laterality: Left;    Social History   Socioeconomic History  . Marital status: Widowed    Spouse name: Not on file  . Number of children: 2  . Years of education: 57  . Highest education level: Not on file  Occupational History  . Not on file  Social Needs  . Financial resource strain: Not on file  . Food insecurity:    Worry: Not on file    Inability: Not on file  . Transportation needs:    Medical: Not on file    Non-medical: Not on file  Tobacco Use  . Smoking status: Former Smoker    Packs/day: 0.50    Types: Cigarettes    Last attempt to quit: 09/09/1968    Years since quitting: 49.9  . Smokeless tobacco: Never Used  Substance and Sexual Activity  . Alcohol use: No  . Drug use: No  . Sexual activity: Never  Lifestyle  . Physical activity:    Days per week: Not on file    Minutes per session: Not on file  . Stress: Not on file  Relationships  . Social connections:    Talks on phone: Not on file    Gets together: Not on file    Attends religious service: Not on file    Active member of club or organization:  Not on file    Attends meetings of clubs or organizations: Not on file    Relationship status: Not on file  Other Topics Concern  . Not on file  Social History Narrative   Has family who helps her get around.     Fun: Read   Denies abuse and feel safe at home.  Family History  Problem Relation Age of Onset  . Ovarian cancer Mother   . COPD Father   . Hypertension Unknown   . High Cholesterol Unknown   . Diabetes Mellitus II Neg Hx   . Lupus Neg Hx   . Sarcoidosis Neg Hx      ROS Review of Systems See HPI Constitution: No fevers or chills No malaise No diaphoresis Skin: No rash or itching Eyes: no blurry vision, no double vision GU: no dysuria or hematuria Neuro: no dizziness or headaches  all others reviewed and negative   Objective: Vitals:   07/28/18 1204  BP: 138/72  Pulse: 63  Resp: 14  Temp: 98.5 F (36.9 C)  TempSrc: Oral  SpO2: 99%  Weight: 200 lb 9.6 oz (91 kg)  Height: 5\' 7"  (1.702 m)    Physical Exam    Physical Exam  Constitutional: He is oriented to person, place, and time. He appears well-developed and well-nourished.  HENT:  Head: Normocephalic and atraumatic.  Eyes: Conjunctivae and EOM are normal.  Neck mass soft and well circumscribed on the right submental area Cardiovascular: Normal rate, regular rhythm, normal heart sounds and intact distal pulses.  No murmur heard. Pulmonary/Chest: Effort normal and breath sounds normal. No stridor. No respiratory distress. He has no wheezes.  Neurological: He is alert and oriented to person, place, and time.  Skin: Severe edema pitting to the knee hyperkeratinazation and hypergimentation of both extremities Warm Palpable pulse Psychiatric: He has a normal mood and affect. His behavior is normal. Judgment and thought content normal.    Lower extremity edema Assessment and Plan Kylene was seen today for follow-up, foot swelling, medication refill and sinusitis.  Diagnoses and all orders  for this visit:  Facial mass -     Ambulatory referral to ENT  Venous insufficiency of both lower extremities, chronic  Decreased independence with activities of daily living  Seasonal allergic rhinitis due to pollen  Other orders -     cetirizine (ZYRTEC) 10 MG tablet; Take 1 tablet (10 mg total) by mouth daily. -     furosemide (LASIX) 40 MG tablet; Take 1 tablet (40 mg total) by mouth daily. Needs office visit. -     traMADol (ULTRAM) 50 MG tablet; Take 1 tablet (50 mg total) by mouth every 6 (six) hours as needed for moderate pain. -     potassium chloride (K-DUR,KLOR-CON) 10 MEQ tablet; Take 1 tablet (10 mEq total) by mouth daily. -     gabapentin (NEURONTIN) 300 MG capsule; Take 1 capsule (300 mg total) by mouth 3 (three) times daily. -     triamcinolone cream (KENALOG) 0.1 %; Apply 1 application topically 2 (two) times daily.   Dependent Edema/vascular insufficiency She was noncompliant with the lasix for the past month due to not getting her refills She sits at home with the legs handing down and without wearing her compression stockings They reports a rash from lyrica she was treated with gabapentin for a trial She is avoiding salty foods She was referred to Vascular Clinic and will follow up Sent in triamcinolone for topical relief I do not believe the described oozing was from the lyrica but from the severe edema that caused an ulcer   Facial Mass She did not get to see ENT due to copay issues Referral closed Sent a new referral Mass is unchanged Discussed follow up for evaluation   Allergies She should try zyrtec for her allergic rhinitis   Jaydis Duchene A  Nolon Rod

## 2018-07-29 ENCOUNTER — Telehealth: Payer: Self-pay | Admitting: Family Medicine

## 2018-07-29 NOTE — Telephone Encounter (Signed)
Dr Nolon Rod advise.  If you advise me on what to tell sister I will call her. Dgaddy, CMA

## 2018-07-29 NOTE — Telephone Encounter (Signed)
Mignon Pine stopped in to ask that Dr.Stallings call her and update her on her Susters most recent visit .  at [phone]

## 2018-07-31 ENCOUNTER — Encounter: Payer: Self-pay | Admitting: Vascular Surgery

## 2018-07-31 ENCOUNTER — Ambulatory Visit (INDEPENDENT_AMBULATORY_CARE_PROVIDER_SITE_OTHER): Payer: Medicare Other | Admitting: Vascular Surgery

## 2018-07-31 ENCOUNTER — Telehealth: Payer: Self-pay | Admitting: Family Medicine

## 2018-07-31 ENCOUNTER — Ambulatory Visit (HOSPITAL_COMMUNITY)
Admission: RE | Admit: 2018-07-31 | Discharge: 2018-07-31 | Disposition: A | Discharge status: Home / Self Care | Payer: Medicare Other | Source: Ambulatory Visit | Attending: Family Medicine | Admitting: Family Medicine

## 2018-07-31 ENCOUNTER — Other Ambulatory Visit: Payer: Self-pay

## 2018-07-31 VITALS — BP 153/85 | HR 78 | Temp 96.8°F | Resp 16 | Ht 67.0 in | Wt 200.0 lb

## 2018-07-31 DIAGNOSIS — I872 Venous insufficiency (chronic) (peripheral): Secondary | ICD-10-CM

## 2018-07-31 DIAGNOSIS — I83891 Varicose veins of right lower extremities with other complications: Secondary | ICD-10-CM | POA: Diagnosis present

## 2018-07-31 NOTE — Progress Notes (Signed)
Patient ID: [name], female   DOB: 1945-11-20, 72 y.o.   MRN: [id number]  Reason for Consult: Venous Insufficiency   Referred by Rosemary Holms, DPM  Subjective:     HPI:  [name] is a 72 y.o. female presents for evaluation bilateral lower extremity swelling worse on the right.  She has some dry skin on the right.  She has never had venous ulceration.  She walks minimally mostly confined to wheelchair.  She sits in a wheelchair most the day.  She does not elevate her legs.  She does not wear compression stockings.  She does not know that she is ever had a DVT although the recent study did not demonstrate any evidence.  She does not take blood thinners.  She was recently seen by podiatry and is now here for evaluation.  Past Medical History:  Diagnosis Date  . Acute pericarditis, unspecified   . Arthritis   . Bilateral swelling of feet   . Cellulitis   . Chronic anemia   . Chronic knee pain    right  . Closed fracture of styloid process of ulna   . Cognitive decline   . Decreased appetite   . Diabetes mellitus    A1C has been normal for past 2 years and takes no diabetic meds at present  . Distal radius fracture 02/04/2016  . ECG abnormal   . Fall   . Hypertension   . Hypoglycemia   . Knee pain, chronic   . Left scapholunate ligament tear   . Lower extremity edema   . Mass of right side of neck   . Microcytic anemia   . Polio   . Radius fracture   . Severe protein-calorie malnutrition ([place])   . Swelling of joint, knee, right   . Type 2 diabetes mellitus, controlled ([place])   . Venous insufficiency    Family History  Problem Relation Age of Onset  . Ovarian cancer Mother   . COPD Father   . Hypertension Unknown   . High Cholesterol Unknown   . Diabetes Mellitus II Neg Hx   . Lupus Neg Hx   . Sarcoidosis Neg Hx    Past Surgical History:  Procedure Laterality Date  . ABDOMINAL HYSTERECTOMY    . OPEN REDUCTION INTERNAL FIXATION (ORIF) DISTAL  RADIAL FRACTURE Left 02/29/2016   Procedure: LEFT DISTAL RADIUS REPAIR OF MALUNION WITH OPEN REDUCTION INTERNAL FIXATION (ORIF);  Surgeon: Iran Planas, MD;  Location: [facility];  Service: Orthopedics;  Laterality: Left;    Short Social History:  Social History   Tobacco Use  . Smoking status: Former Smoker    Packs/day: 0.50    Types: Cigarettes    Last attempt to quit: 09/09/1968    Years since quitting: 49.9  . Smokeless tobacco: Never Used  Substance Use Topics  . Alcohol use: No    Allergies  Allergen Reactions  . Orange Concentrate [Flavoring Agent] Shortness Of Breath, Itching and Swelling  . Peach [Prunus Persica] Shortness Of Breath, Itching and Swelling  . Penicillins Anaphylaxis and Hives    All 'cillins' Has patient had a PCN reaction causing immediate rash, facial/tongue/throat swelling, SOB or lightheadedness with hypotension: Yes Has patient had a PCN reaction causing severe rash involving mucus membranes or skin necrosis: No Has patient had a PCN reaction that required hospitalization No Has patient had a PCN reaction occurring within the last 10 years: No If all of the above answers are "NO", then may proceed  with Cephalosporin use.   . Strawberry Extract Shortness Of Breath, Itching and Swelling  . Adhesive [Tape] Itching and Rash    Please use "paper" tape  . Aspirin Itching  . Latex Itching and Rash  . Other Hives, Itching and Rash    NO "-CILLINS"  . Pineapple Itching and Rash  . Strawberry Flavor Itching and Rash    Current Outpatient Medications  Medication Sig Dispense Refill  . acetaminophen (TYLENOL) 325 MG tablet Take 2 tablets (650 mg total) by mouth every 6 (six) hours as needed for mild pain (or Fever >/= 101).    . cetirizine (ZYRTEC) 10 MG tablet Take 1 tablet (10 mg total) by mouth daily. 30 tablet 11  . clobetasol cream (TEMOVATE) 0.05 % Apply topically.    . furosemide (LASIX) 40 MG tablet Take 1 tablet (40 mg total) by mouth daily. Needs  office visit. 90 tablet 0  . gabapentin (NEURONTIN) 300 MG capsule Take 1 capsule (300 mg total) by mouth 3 (three) times daily. 90 capsule 0  . potassium chloride (K-DUR,KLOR-CON) 10 MEQ tablet Take 1 tablet (10 mEq total) by mouth daily.    . traMADol (ULTRAM) 50 MG tablet Take 1 tablet (50 mg total) by mouth every 6 (six) hours as needed for moderate pain. 10 tablet 0  . triamcinolone cream (KENALOG) 0.1 % Apply 1 application topically 2 (two) times daily. 80 g 0  . silver sulfADIAZINE (SILVADENE) 1 % cream Apply topically.     No current facility-administered medications for this visit.     Review of Systems  Constitutional:  Constitutional negative. HENT: HENT negative.  Eyes: Eyes negative.  Respiratory: Respiratory negative.  Cardiovascular: Positive for leg swelling.  GI: Gastrointestinal negative.  Musculoskeletal: Positive for leg pain.  Skin: Positive for rash.  Neurological: Neurological negative. Hematologic: Hematologic/lymphatic negative.  Psychiatric: Psychiatric negative.        Objective:  Objective   Vitals:   07/31/18 1345  BP: (!) 153/85  Pulse: 78  Resp: 16  Temp: (!) 96.8 F (36 C)  TempSrc: Oral  SpO2: 100%  Weight: 200 lb (90.7 kg)  Height: 5\' 7"  (1.702 m)   Body mass index is 31.32 kg/m.  Physical Exam  Constitutional: She is oriented to person, place, and time. She appears well-developed.  HENT:  Head: Normocephalic.  Eyes: Pupils are equal, round, and reactive to light.  Neck: Normal range of motion.  Cardiovascular: Normal rate and regular rhythm.  Pulses:      Radial pulses are 2+ on the right side, and 2+ on the left side.       Dorsalis pedis pulses are 2+ on the right side, and 2+ on the left side.  Pulmonary/Chest: Effort normal.  Musculoskeletal: She exhibits edema.  Neurological: She is alert and oriented to person, place, and time.  Skin: Capillary refill takes less than 2 seconds.  Woody changes to bilateral lower  extremities right greater than left  Psychiatric: She has a normal mood and affect. Her behavior is normal. Judgment and thought content normal.    Data: I have independently interpreted her right lower extremity venous reflux study which demonstrates no discernible reflux in her greater saphenous vein mid thigh maximal diameter 0.35 cm.  Study was limited by patient compliance issues and immobility     Assessment/Plan:    72 year old female with bilateral lower extremity swelling right greater than left appears to have C4 venous disease although we cannot demonstrate any reflux.  Saphenous vein  is really not that large particularly on the right side where she is most affected as we do not evaluate the left side today.  Given that she is mostly wheelchair-bound keeps her legs in a dependent position I have recommended conservative measures.  Conservative measures would mean elevating her legs when recumbent as well as increasing her activity level and wearing at least knee-high compression stockings.  Hopefully these changes will help prevent ulceration in her bilateral lower extremities.  She demonstrates good understanding in the presence of her family.      Waynetta Sandy MD [facility]

## 2018-07-31 NOTE — Telephone Encounter (Signed)
TALK TO CAREGIVER AND GAVE HER THE NUMBER FOR DR [facility]    Copied from [place] [phone]. Topic: Referral - Status >> Jul 31, 2018 10:48 AM Conception Chancy, NT wrote: Reason for CRM: Marlowe Kays is calling from Dr. Lucia Gaskins office and states they received another fax in regards to this patient but the patient no showed her appointment on Aug 26th and they have tried reaching her the past 2 days to reschedule but are unable to reach her. I informed Marlowe Kays if the numbers we have listed on file and she is going to try and reach her again.

## 2018-08-02 NOTE — Telephone Encounter (Signed)
Please let her know that I advised Ms [name] and her daughter to keep the compression stockings on and to follow up with vascular surgery. Also it is extremely important that she keeps her legs elevated and avoid salty foods.  A topical ointment called triamcinolone was prescribed to help with the skin itching.

## 2018-08-03 NOTE — Telephone Encounter (Signed)
Tried contacting pt at [phone] and voicemail not setup, tried pt's sister at [phone] and recording advises this number is unreachable and tried daughter at [phone] with a busy signal and no voicemail to leave message.  Will try pt number again later. Dgaddy, CMA

## 2018-08-04 ENCOUNTER — Telehealth: Payer: Self-pay | Admitting: Family Medicine

## 2018-08-04 NOTE — Telephone Encounter (Signed)
Copied from [place] [phone]. Topic: General - Other >> Aug 04, 2018  8:58 AM Conception Chancy, NT wrote: Reason for CRM: patient sister Arville Go is calling and requesting a status health update on the patient.

## 2018-08-04 NOTE — Telephone Encounter (Signed)
Tried calling sister joann at [phone] and recording says not reachable.  Unable to leave a message. Dgaddy, CMA

## 2018-08-14 ENCOUNTER — Ambulatory Visit: Payer: Medicare Other | Admitting: Podiatry

## 2018-09-07 ENCOUNTER — Telehealth: Payer: Self-pay | Admitting: Family Medicine

## 2018-09-07 DIAGNOSIS — F0391 Unspecified dementia with behavioral disturbance: Secondary | ICD-10-CM

## 2018-09-07 DIAGNOSIS — Z7409 Other reduced mobility: Secondary | ICD-10-CM

## 2018-09-07 NOTE — Telephone Encounter (Unsigned)
Copied from [place] [id number]. Topic: Referral - Request for Referral >> Sep 07, 2018 11:05 AM Parke Poisson wrote: Has patient seen PCP for this complaint? yes *If NO, is insurance requiring patient see PCP for this issue before PCP can refer them? Referral for which specialty: home health care [facility]erred provider/office: [facility] or [facility] Reason for referral: weakness,hard to get around Can contact SIL Glean Salvo at [phone] or daughter [name] [phone]

## 2018-09-08 NOTE — Telephone Encounter (Signed)
Referral completed. Will notify referral team.

## 2018-09-14 ENCOUNTER — Telehealth: Payer: Self-pay | Admitting: Family Medicine

## 2018-09-14 DIAGNOSIS — Z993 Dependence on wheelchair: Secondary | ICD-10-CM

## 2018-09-14 DIAGNOSIS — I872 Venous insufficiency (chronic) (peripheral): Secondary | ICD-10-CM

## 2018-09-14 DIAGNOSIS — R4189 Other symptoms and signs involving cognitive functions and awareness: Secondary | ICD-10-CM

## 2018-09-14 NOTE — Telephone Encounter (Signed)
Copied from [place] [phone]. Topic: Referral - Request for Referral >> Sep 14, 2018 11:43 AM Lionel December wrote: Has patient seen PCP for this complaint? Yes.   *If NO, is insurance requiring patient see PCP for this issue before PCP can refer them? Referral for which specialty: Home Health Preferred provider/office: [place] .Marland KitchenMarland KitchenFax- [phone] or [facility] .... [phone] Reason for referral: Family needing assistance with pt

## 2018-09-17 NOTE — Telephone Encounter (Signed)
Referral placed. Thank you

## 2018-09-17 NOTE — Telephone Encounter (Signed)
Please advise 

## 2018-09-18 ENCOUNTER — Telehealth: Payer: Self-pay | Admitting: Family Medicine

## 2018-09-18 NOTE — Telephone Encounter (Signed)
Copied from [place] [phone]. Topic: Quick Communication - See Telephone Encounter >> Sep 18, 2018  1:42 PM Blase Mess A wrote: CRM for notification. See Telephone encounter for: 09/18/18.   Mellisa with [facility] is calling to say that they are max out in the area of the patient. They are unable to serve her at this time. [phone]

## 2018-09-21 NOTE — Telephone Encounter (Signed)
Spoke with referrals today and a new referral will be sent to a different home health care service. They will call to follow-up with pt.

## 2018-10-06 ENCOUNTER — Telehealth: Payer: Self-pay | Admitting: Family Medicine

## 2018-10-06 NOTE — Telephone Encounter (Signed)
LVM for pt to call the office and reschedule their appt that was scheduled for 10/28/18 with Dr. Nolon Rod. Due to a provider meeting, Dr. Nolon Rod will be unavailable. When pt calls back, please reschedule at their convenience. Thank you!

## 2018-10-19 ENCOUNTER — Telehealth: Payer: Self-pay | Admitting: Family Medicine

## 2018-10-19 NOTE — Telephone Encounter (Signed)
Copied from [place] [phone]. Topic: Referral - Question >> Oct 19, 2018  3:15 PM Vernona Rieger wrote: Reason for CRM: Patient's sister said that [facility] needs an updated referral sent to them for someone to help her bathe a few times a week [phone]

## 2018-10-20 NOTE — Telephone Encounter (Signed)
I have called the [facility] and they stated that they are in communication with pts sister Arville Go and they are arranging a schedule to come out. I stated understanding.  [facility] = [phone]   Attempted to call sister to notify her. There was no answer and the busy signal.

## 2018-10-28 ENCOUNTER — Ambulatory Visit: Payer: Medicare Other | Admitting: Family Medicine

## 2018-10-29 ENCOUNTER — Telehealth: Payer: Self-pay | Admitting: Family Medicine

## 2018-10-29 NOTE — Telephone Encounter (Signed)
LVM for pt regarding their appt scheduled for 4/15 with Dr. Nolon Rod. Due to provider being out of the office, we will need to get the pt rescheduled. When pt calls back, please reschedule that appt at their convenience. Thank you!

## 2018-11-09 ENCOUNTER — Ambulatory Visit: Payer: Self-pay

## 2018-11-09 NOTE — Telephone Encounter (Addendum)
POA - Joann, called and left a message that pt. Was having "burning sensation in her legs x 2 weeks." She was not with the pt. Asked we call and talk to [name] (daughter) who lives with pt. Called and spoke with [name]. [name] reports pt. Has not complained of any leg burning or pain. States "she has dementia and forgets a lot, but she has not complained to me." Appointment for follow up made with Dr. Nolon Rod. Called [facility]) and left a message to call back in regard to above.Arville Go would like pt. Worked in Saturday if possible. Her number is [phone]. Would like her legs evaluated for pain and swelling.

## 2018-12-16 DIAGNOSIS — M2550 Pain in unspecified joint: Secondary | ICD-10-CM | POA: Diagnosis not present

## 2018-12-16 DIAGNOSIS — S52502D Unspecified fracture of the lower end of left radius, subsequent encounter for closed fracture with routine healing: Secondary | ICD-10-CM | POA: Diagnosis not present

## 2018-12-16 DIAGNOSIS — I872 Venous insufficiency (chronic) (peripheral): Secondary | ICD-10-CM | POA: Diagnosis not present

## 2018-12-16 DIAGNOSIS — M1380 Other specified arthritis, unspecified site: Secondary | ICD-10-CM | POA: Diagnosis not present

## 2018-12-23 ENCOUNTER — Telehealth: Payer: Self-pay

## 2018-12-23 ENCOUNTER — Ambulatory Visit: Payer: Medicare Other | Admitting: Family Medicine

## 2018-12-23 NOTE — Telephone Encounter (Signed)
Spoke with sister Yvone Neu [phone] who wants 04/17 appt to be in office.  Advised we are strongly encouraging patients to have telemed or virtual appts to help decrease exposure to COVID esp at pt's age.  Sister states pt would be able to have appt via phone - unsure as to what needed discussed at appt other than it is follow up.  States pt may have trouble answering phone.  Call to [facility] [phone].  States CNA can be there at appt time to help pt answer phone but CNA will not be able to provide answers to questions. States pt can answer questions although they may not always be accurate due to her memory issues.  Pt's case manager is Lattie Haw and can be reached by calling [place] at number above.

## 2018-12-25 ENCOUNTER — Encounter: Payer: Medicare Other | Admitting: Family Medicine

## 2018-12-25 ENCOUNTER — Other Ambulatory Visit: Payer: Self-pay

## 2018-12-25 NOTE — Progress Notes (Signed)
CC: f/u,  Open wound on back of legs and cream prescribed is not helping.  Wants to discuss getting a new provider that can be more attentive to pt needs.  Needs refill on cetirizine.  Requesting medication lotion for dry skin and open wounds.  No travel outside the [place] or [place] within the last 3 weeks.

## 2018-12-25 NOTE — Patient Instructions (Signed)
° ° ° °  If you have lab work done today you will be contacted with your lab results within the next 2 weeks.  If you have not heard from us then please contact us. The fastest way to get your results is to register for My Chart. ° ° °IF you received an x-ray today, you will receive an invoice from [facility]. Please contact [facility] at [phone] with questions or concerns regarding your invoice.  ° °IF you received labwork today, you will receive an invoice from [facility]. Please contact [facility] at [phone] with questions or concerns regarding your invoice.  ° °Our billing staff will not be able to assist you with questions regarding bills from these companies. ° °You will be contacted with the lab results as soon as they are available. The fastest way to get your results is to activate your My Chart account. Instructions are located on the last page of this paperwork. If you have not heard from us regarding the results in 2 weeks, please contact this office. °  ° ° ° °

## 2018-12-25 NOTE — Progress Notes (Signed)
Patient daughter was unable to do televisit. This encounter was created in error - please disregard.

## 2018-12-29 ENCOUNTER — Other Ambulatory Visit: Payer: Self-pay

## 2018-12-29 ENCOUNTER — Ambulatory Visit: Payer: Medicare Other | Admitting: Family Medicine

## 2019-01-01 ENCOUNTER — Telehealth: Payer: Self-pay | Admitting: *Deleted

## 2019-01-01 ENCOUNTER — Other Ambulatory Visit: Payer: Self-pay

## 2019-01-01 ENCOUNTER — Encounter: Payer: Self-pay | Admitting: Family Medicine

## 2019-01-01 ENCOUNTER — Ambulatory Visit (INDEPENDENT_AMBULATORY_CARE_PROVIDER_SITE_OTHER): Payer: Medicare Other | Admitting: Family Medicine

## 2019-01-01 VITALS — BP 167/93 | HR 93 | Temp 98.3°F | Resp 16 | Wt 194.2 lb

## 2019-01-01 DIAGNOSIS — E8809 Other disorders of plasma-protein metabolism, not elsewhere classified: Secondary | ICD-10-CM

## 2019-01-01 DIAGNOSIS — F0391 Unspecified dementia with behavioral disturbance: Secondary | ICD-10-CM

## 2019-01-01 DIAGNOSIS — R4189 Other symptoms and signs involving cognitive functions and awareness: Secondary | ICD-10-CM

## 2019-01-01 DIAGNOSIS — I872 Venous insufficiency (chronic) (peripheral): Secondary | ICD-10-CM

## 2019-01-01 DIAGNOSIS — Z7409 Other reduced mobility: Secondary | ICD-10-CM

## 2019-01-01 DIAGNOSIS — E11620 Type 2 diabetes mellitus with diabetic dermatitis: Secondary | ICD-10-CM | POA: Diagnosis not present

## 2019-01-01 MED ORDER — TRIAMCINOLONE ACETONIDE 0.1 % EX CREA: 1.0000 "application " | TOPICAL_CREAM | Freq: Two times a day (BID) | CUTANEOUS | 0 refills | Status: DC

## 2019-01-01 MED ORDER — FUROSEMIDE 40 MG PO TABS: 40.0000 mg | ORAL_TABLET | Freq: Every day | ORAL | 1 refills | Status: DC

## 2019-01-01 MED ORDER — POTASSIUM CHLORIDE CRYS ER 10 MEQ PO TBCR: 10.0000 meq | EXTENDED_RELEASE_TABLET | Freq: Every day | ORAL | 1 refills | Status: DC

## 2019-01-01 MED ORDER — GABAPENTIN 300 MG PO CAPS: 300.0000 mg | ORAL_CAPSULE | Freq: Three times a day (TID) | ORAL | 1 refills | Status: DC

## 2019-01-01 NOTE — Telephone Encounter (Signed)
Faxed documents to [facility]. Confirmation page received at 2:40 pm.

## 2019-01-01 NOTE — Telephone Encounter (Signed)
Faxed signed order for hospital bed to [facility]. Confirmation page received at 12:19 pm.

## 2019-01-01 NOTE — Patient Instructions (Addendum)
If you have lab work done today you will be contacted with your lab results within the next 2 weeks.  If you have not heard from [place] then please contact us. The fastest way to get your results is to register for My Chart.   IF you received an x-ray today, you will receive an invoice from [facility]. Please contact [facility] at [phone] with questions or concerns regarding your invoice.   IF you received labwork today, you will receive an invoice from [facility]. Please contact [facility] at [phone] with questions or concerns regarding your invoice.   Our billing staff will not be able to assist you with questions regarding bills from these companies.  You will be contacted with the lab results as soon as they are available. The fastest way to get your results is to activate your My Chart account. Instructions are located on the last page of this paperwork. If you have not heard from [place] regarding the results in 2 weeks, please contact this office.     Stasis Dermatitis Stasis dermatitis is a long-term (chronic) skin condition that happens when veins can no longer pump blood back to the heart (poor circulation). This condition causes a red or brown scaly rash or sores (ulcers) from the pooling of blood (stasis). This condition usually affects the lower legs. It may affect one leg or both legs. Without treatment, severe stasis dermatitis can lead to other skin conditions and infections. What are the causes? This condition is caused by poor circulation. What increases the risk? You are more likely to develop this condition if:  You are not very active.  You stand for long periods of time.  You have veins that have become enlarged and twisted (varicose veins).  You have leg veins that are not strong enough to send blood back to the heart (venous insufficiency).  You have had a blood clot.  You have been pregnant many times.  You have had vein  surgery.  You are obese.  You have heart or kidney failure.  You are 61 years of age or older.  You have had injuries to your legs in the past. What are the signs or symptoms? Common early symptoms of this condition include:  Itchiness in one or both of your legs.  Swelling in your ankle or leg. This might get better overnight but be worse again during the day.  Skin that looks thin on your ankle and leg.  Red or brown marks that develop slowly.  Skin that is dry, cracked, or easily irritated.  Red, swollen skin that is sore or has a burning feeling.  An achy or heavy feeling after you walk or stand for long periods of time.  Pain. Later and more severe symptoms of this condition include:  Skin that looks shiny.  Small, open sores (ulcers). These are often red or purple and leak fluid.  Skin that feels hard.  Severe itching.  A change in the shape or color of your lower legs.  Severe pain.  Difficulty walking. How is this diagnosed? This condition may be diagnosed based on:  Your symptoms and medical history.  A physical exam. You may also have tests, including:  Blood tests.  Imaging tests to check blood flow (Doppler ultrasound).  Allergy tests. You may need to see a health care provider who specializes in skin diseases (dermatologist). How is this treated? This condition may be treated with:  Compression stockings or an elastic wrap  to improve circulation.  Medicines, such as: ? Corticosteroid creams and ointments. ? Non-corticosteroid medicines applied to the skin (topical). ? Medicine to reduce swelling in the legs (diuretics). ? Antibiotics. ? Medicine to relieve itching (antihistamines).  A bandage (dressing).  A wrap that contains zinc and gelatin (Unna boot). Follow these instructions at home: Skin care  Moisturize your skin as told by your health care provider. Do not use moisturizers with fragrance. This can irritate your  skin.  Apply a cool, wet cloth (cool compress) to the affected areas.  Do not scratch your skin.  Do not rub your skin dry after a bath or shower. Gently pat your skin dry.  Do not use scented soaps, detergents, or perfumes. Medicines  Take or use over-the-counter and prescription medicines only as told by your health care provider.  If you were prescribed an antibiotic medicine, take or use it as told by your health care provider. Do not stop taking or using the antibiotic even if your condition improves. Activity  Walk as told by your health care provider. Walking increases blood flow.  Do calf and ankle exercises throughout the day as told by your health care provider. This will help increase blood flow.  Raise (elevate) your legs above the level of your heart when you are sitting or lying down. Lifestyle  Work with your health care provider to lose weight, if needed.  Do not cross your legs when you sit.  Do not stand or sit in one position for long periods of time.  Wear comfortable, loose-fitting clothing. Circulation in your legs will be worse if you wear tight pants, belts, and waistbands.  Do not use any products that contain nicotine or tobacco, such as cigarettes, e-cigarettes, and chewing tobacco. If you need help quitting, ask your health care provider. General instructions  If you were asked to use one of the following to help with your condition, follow instructions from your health care provider on how to: ? Remove and change any dressing. ? Wear compression stockings. These stockings help to prevent blood clots and reduce swelling in your legs. ? Wear the [employer].  Keep all follow-up visits as told by your health care provider. This is important. Contact a health care provider if:  Your condition does not improve with treatment.  Your condition gets worse.  You have signs of infection in the affected area. Watch  for: ? Swelling. ? Tenderness. ? Redness. ? Soreness. ? Warmth.  You have a fever. Get help right away if:  You notice red streaks coming from the affected area.  Your bone or joint underneath the affected area becomes painful after the skin has healed.  The affected area turns darker.  You feel a deep pain in your leg or groin.  You are short of breath. Summary  Stasis dermatitis is a long-term (chronic) skin condition that happens when veins can no longer pump blood back to the heart (poor circulation).  Wear compression stockings as told by your health care provider. These stockings help to prevent blood clots and reduce swelling in your legs.  Follow instructions from your health care provider about activity, medicines, and lifestyle.  Contact a health care provider if you have a fever or have signs of infection in the affected area.  Keep all follow-up visits as told by your health care provider. This is important. This information is not intended to replace advice given to you by your health care provider. Make sure you  discuss any questions you have with your health care provider. Document Released: 12/05/2005 Document Revised: 01/26/2018 Document Reviewed: 01/26/2018 Elsevier Interactive Patient Education  2019 [employer].

## 2019-01-01 NOTE — Progress Notes (Addendum)
Established Patient Office Visit  Subjective:  Patient ID: [name], female    DOB: [birth year]  Age: 73 y.o. MRN: [id number]  CC:  Chief Complaint  Patient presents with  . Leg Pain    RIGHT WITH WOUNDS  that go and come AND SWELLING x 1 year  . Medication Refill    HPI Sura Canul presents for   Medication refills  MMSE 19/30  Lydia from [facility] in person  [name] her daughter on the phone  Venous Insufficiency -Chronic with reflux Patient has been sitting in a wheelchair most of the day and night She states that she cannot get the compression stockings on Her daughter works and the patient has a home health aid from [place] that comes to the home and takes her to appointments The patient states that it is difficulty to bend her leg and her right leg is mostly affected She states that it just feels tight all the time  She has a history of chronic venous insufficiency with venous reflux and has seen Vascular surgery  Her vascular surgery appointments they advised compression stockings She ran out of her lasix and has not been taking it   Limited Mobility She also had right knee pain and has history of arthritis of the right knee. She has a limp and walks with a cane but is hunched over the cane. She states that it is just very stiff and hard to walk.  Dementia Patient has cognitive decline and cannot remember if she has eaten  She does not eat alone because she has lunch with her aid and again has dinner with her daughter She sits in the chair most of the day 19/30 mini mental status exam     Past Medical History:  Diagnosis Date  . Acute pericarditis, unspecified   . Arthritis   . Bilateral swelling of feet   . Cellulitis   . Chronic anemia   . Chronic knee pain    right  . Closed fracture of styloid process of ulna   . Cognitive decline   . Decreased appetite   . Diabetes mellitus    A1C has been normal for past 2 years and  takes no diabetic meds at present  . Distal radius fracture 02/04/2016  . ECG abnormal   . Fall   . Hypertension   . Hypoglycemia   . Knee pain, chronic   . Left scapholunate ligament tear   . Lower extremity edema   . Mass of right side of neck   . Microcytic anemia   . Polio   . Radius fracture   . Severe protein-calorie malnutrition ([place])   . Swelling of joint, knee, right   . Type 2 diabetes mellitus, controlled ([place])   . Venous insufficiency     Past Surgical History:  Procedure Laterality Date  . ABDOMINAL HYSTERECTOMY    . OPEN REDUCTION INTERNAL FIXATION (ORIF) DISTAL RADIAL FRACTURE Left 02/29/2016   Procedure: LEFT DISTAL RADIUS REPAIR OF MALUNION WITH OPEN REDUCTION INTERNAL FIXATION (ORIF);  Surgeon: Iran Planas, MD;  Location: [facility];  Service: Orthopedics;  Laterality: Left;    Family History  Problem Relation Age of Onset  . Ovarian cancer Mother   . COPD Father   . Hypertension Unknown   . High Cholesterol Unknown   . Diabetes Mellitus II Neg Hx   . Lupus Neg Hx   . Sarcoidosis Neg Hx     Social History   Socioeconomic History  .  Marital status: Widowed    Spouse name: Not on file  . Number of children: 2  . Years of education: 51  . Highest education level: Not on file  Occupational History  . Not on file  Social Needs  . Financial resource strain: Not on file  . Food insecurity:    Worry: Not on file    Inability: Not on file  . Transportation needs:    Medical: Not on file    Non-medical: Not on file  Tobacco Use  . Smoking status: Former Smoker    Packs/day: 0.50    Types: Cigarettes    Last attempt to quit: 09/09/1968    Years since quitting: 50.3  . Smokeless tobacco: Never Used  Substance and Sexual Activity  . Alcohol use: No  . Drug use: No  . Sexual activity: Never  Lifestyle  . Physical activity:    Days per week: Not on file    Minutes per session: Not on file  . Stress: Not on file  Relationships  . Social connections:     Talks on phone: Not on file    Gets together: Not on file    Attends religious service: Not on file    Active member of club or organization: Not on file    Attends meetings of clubs or organizations: Not on file    Relationship status: Not on file  . Intimate partner violence:    Fear of current or ex partner: Not on file    Emotionally abused: Not on file    Physically abused: Not on file    Forced sexual activity: Not on file  Other Topics Concern  . Not on file  Social History Narrative   Has family who helps her get around.     Fun: Read   Denies abuse and feel safe at home.    Outpatient Medications Prior to Visit  Medication Sig Dispense Refill  . acetaminophen (TYLENOL) 325 MG tablet Take 2 tablets (650 mg total) by mouth every 6 (six) hours as needed for mild pain (or Fever >/= 101).    . clobetasol cream (TEMOVATE) 0.05 % Apply topically.    . silver sulfADIAZINE (SILVADENE) 1 % cream Apply topically.    . furosemide (LASIX) 40 MG tablet Take 1 tablet (40 mg total) by mouth daily. Needs office visit. 90 tablet 0  . cetirizine (ZYRTEC) 10 MG tablet Take 1 tablet (10 mg total) by mouth daily. (Patient not taking: Reported on 01/01/2019) 30 tablet 11  . traMADol (ULTRAM) 50 MG tablet Take 1 tablet (50 mg total) by mouth every 6 (six) hours as needed for moderate pain. (Patient not taking: Reported on 01/01/2019) 10 tablet 0  . gabapentin (NEURONTIN) 300 MG capsule Take 1 capsule (300 mg total) by mouth 3 (three) times daily. (Patient not taking: Reported on 01/01/2019) 90 capsule 0  . potassium chloride (K-DUR,KLOR-CON) 10 MEQ tablet Take 1 tablet (10 mEq total) by mouth daily. (Patient not taking: Reported on 01/01/2019)    . triamcinolone cream (KENALOG) 0.1 % Apply 1 application topically 2 (two) times daily. 80 g 0   No facility-administered medications prior to visit.     Allergies  Allergen Reactions  . Orange Concentrate [Flavoring Agent] Shortness Of Breath, Itching  and Swelling  . Peach [Prunus Persica] Shortness Of Breath, Itching and Swelling  . Penicillins Anaphylaxis and Hives    All 'cillins' Has patient had a PCN reaction causing immediate rash, facial/tongue/throat swelling, SOB  or lightheadedness with hypotension: Yes Has patient had a PCN reaction causing severe rash involving mucus membranes or skin necrosis: No Has patient had a PCN reaction that required hospitalization No Has patient had a PCN reaction occurring within the last 10 years: No If all of the above answers are "NO", then may proceed with Cephalosporin use.   . Strawberry Extract Shortness Of Breath, Itching and Swelling  . Tylenol [Acetaminophen] Itching    Per patient makes her itch  . Adhesive [Tape] Itching and Rash    Please use "paper" tape  . Aspirin Itching  . Latex Itching and Rash  . Other Hives, Itching and Rash    NO "-CILLINS"  . Pineapple Itching and Rash  . Strawberry Flavor Itching and Rash    ROS Review of Systems     Review of Systems  Constitutional: Negative for activity change, appetite change, chills and fever.  HENT: Negative for congestion, nosebleeds, trouble swallowing and voice change.   Respiratory: Negative for cough, shortness of breath and wheezing.   Gastrointestinal: Negative for diarrhea, nausea and vomiting.  Genitourinary: Negative for difficulty urinating, dysuria, flank pain and hematuria.  Musculoskeletal: see hpi Neurological: Negative for dizziness, speech difficulty, light-headedness and numbness.  See HPI. All other review of systems negative.   Objective:    Physical Exam  BP (!) 167/93   Pulse 93   Temp 98.3 F (36.8 C) (Oral)   Resp 16   Wt 194 lb 3.2 oz (88.1 kg)   SpO2 94%   BMI 30.42 kg/m  Wt Readings from Last 3 Encounters:  01/01/19 194 lb 3.2 oz (88.1 kg)  07/31/18 200 lb (90.7 kg)  07/28/18 200 lb 9.6 oz (91 kg)        Physical Exam  Constitutional: Oriented to person, place, and time.  Appears well-developed and well-nourished.  HENT:  Head: Normocephalic and atraumatic.  Eyes: Conjunctivae and EOM are normal.  Cardiovascular: Normal rate, regular rhythm, normal heart sounds and intact distal pulses.  No murmur heard. Pulmonary/Chest: Effort normal and breath sounds normal. No stridor. No respiratory distress. Has no wheezes.  Neurological: Is alert and oriented to person, and place  Skin: dry with chronic stasis changes Psychiatric: Has a normal mood and affect. Behavior is normal. Judgment and thought content normal.    Health Maintenance Due  Topic Date Due  . FOOT EXAM  11/22/1953  . OPHTHALMOLOGY EXAM  11/22/1953  . URINE MICROALBUMIN  11/22/1953  . TETANUS/TDAP  11/23/1962  . COLONOSCOPY  11/22/1993  . DEXA SCAN  11/22/2008  . PNA vac Low Risk Adult (1 of 2 - PCV13) 11/22/2008  . HEMOGLOBIN A1C  08/28/2018    There are no preventive care reminders to display for this patient.  Lab Results  Component Value Date   TSH 0.295 (L) 03/09/2018   Lab Results  Component Value Date   WBC 4.8 02/18/2018   HGB 9.7 (L) 02/18/2018   HCT 32.0 (L) 02/18/2018   MCV 73.7 (L) 02/18/2018   PLT 192 02/18/2018   Lab Results  Component Value Date   NA 140 02/18/2018   K 3.6 02/18/2018   CO2 27 02/18/2018   GLUCOSE 81 02/18/2018   BUN 17 02/18/2018   CREATININE 0.88 02/18/2018   BILITOT 0.7 02/16/2018   ALKPHOS 73 02/16/2018   AST 12 (L) 02/16/2018   ALT 10 (L) 02/16/2018   PROT 6.3 (L) 02/16/2018   ALBUMIN 2.9 (L) 02/16/2018   CALCIUM 8.2 (L) 02/18/2018  ANIONGAP 6 02/18/2018   GFR 102.02 03/24/2017   Lab Results  Component Value Date   CHOL 228 (H) 01/19/2018   Lab Results  Component Value Date   HDL 61 01/19/2018   Lab Results  Component Value Date   LDLCALC 149 (H) 01/19/2018   Lab Results  Component Value Date   TRIG 90 01/19/2018   Lab Results  Component Value Date   CHOLHDL 3.7 01/19/2018   Lab Results  Component Value Date    HGBA1C 5.2 02/26/2018      Assessment & Plan:   Problem List Items Addressed This Visit      Cardiovascular and Mediastinum   Venous insufficiency of both lower extremities, chronic Will order leg pumps from [facility]  Fax: [phone] Phone: [phone]   Relevant Medications   furosemide (LASIX) 40 MG tablet   Other Relevant Orders   DME Other see comment   CBC with Differential/Platelet   Lipid panel   CMP14+EGFR   TSH     Endocrine   Type 2 diabetes mellitus, controlled ([place])  - previously at goal and diet controlled  Will monitor Lab Results  Component Value Date   HGBA1C 5.2 02/26/2018      Relevant Orders   Lipid panel   CMP14+EGFR   POCT glycosylated hemoglobin (Hb A1C)     Nervous and Auditory   Dementia with behavioral problem ([place])  - no recent combative behavior but poor sleep    Relevant Medications   gabapentin (NEURONTIN) 300 MG capsule     Other   Cognitive decline  - with severe dementia and poor nutrition   Decreased mobility and endurance - Primary  Ordered a hospital bed to improve her quality of rest and get patient up from the chair for sleep This is not to have the patient bedbound  Semi-electric hospital bed would be best  Patient requires re-positioning of the body in ways that cannot be achieved with an ordinary bed or wedge pillow, to eliminate pain, reduce pressure, or treat existence of pressure sores.  Patient requires the head of the bed to be elevated at lease 30 degrees or more most of the time. Patient requires frequent/immediate changes in body positions which cannot be achieved with a a normal bed.     Relevant Orders   DME Other see comment    Other Visit Diagnoses    Hypoalbuminemia    -  Discussed the issue of nutrition Patient does not seem to be getting the right nutritients She is slowly losing weight       Meds ordered this encounter  Medications  . furosemide (LASIX) 40 MG tablet    Sig: Take  1 tablet (40 mg total) by mouth daily.    Dispense:  90 tablet    Refill:  1    Needs office visit  . potassium chloride (K-DUR) 10 MEQ tablet    Sig: Take 1 tablet (10 mEq total) by mouth daily.    Dispense:  90 tablet    Refill:  1  . gabapentin (NEURONTIN) 300 MG capsule    Sig: Take 1 capsule (300 mg total) by mouth 3 (three) times daily.    Dispense:  90 capsule    Refill:  1  . triamcinolone cream (KENALOG) 0.1 %    Sig: Apply 1 application topically 2 (two) times daily.    Dispense:  80 g    Refill:  0    Follow-up: Return in about 6  months (around 07/03/2019) for venous insufficiency.   A total of 45 minutes were spent face-to-face with the patient during this encounter and over half of that time was spent on counseling and coordination of care.  Forrest Moron, MD

## 2019-01-02 LAB — CBC WITH DIFFERENTIAL/PLATELET
Basophils Absolute: 0 10*3/uL (ref 0.0–0.2)
Basos: 0 %
EOS (ABSOLUTE): 0.1 10*3/uL (ref 0.0–0.4)
Eos: 3 %
Hematocrit: 32.2 % — ABNORMAL LOW (ref 34.0–46.6)
Hemoglobin: 10.1 g/dL — ABNORMAL LOW (ref 11.1–15.9)
Immature Grans (Abs): 0 10*3/uL (ref 0.0–0.1)
Immature Granulocytes: 0 %
Lymphocytes Absolute: 1.4 10*3/uL (ref 0.7–3.1)
Lymphs: 30 %
MCH: 22.9 pg — ABNORMAL LOW (ref 26.6–33.0)
MCHC: 31.4 g/dL — ABNORMAL LOW (ref 31.5–35.7)
MCV: 73 fL — ABNORMAL LOW (ref 79–97)
Monocytes Absolute: 0.3 10*3/uL (ref 0.1–0.9)
Monocytes: 7 %
Neutrophils Absolute: 2.7 10*3/uL (ref 1.4–7.0)
Neutrophils: 60 %
Platelets: 227 10*3/uL (ref 150–450)
RBC: 4.42 x10E6/uL (ref 3.77–5.28)
RDW: 14.5 % (ref 11.7–15.4)
WBC: 4.6 10*3/uL (ref 3.4–10.8)

## 2019-01-02 LAB — CMP14+EGFR
ALT: 7 IU/L (ref 0–32)
AST: 10 IU/L (ref 0–40)
Albumin/Globulin Ratio: 1.1 — ABNORMAL LOW (ref 1.2–2.2)
Albumin: 3.6 g/dL — ABNORMAL LOW (ref 3.7–4.7)
Alkaline Phosphatase: 92 IU/L (ref 39–117)
BUN/Creatinine Ratio: 24 (ref 12–28)
BUN: 19 mg/dL (ref 8–27)
Bilirubin Total: 0.3 mg/dL (ref 0.0–1.2)
CO2: 25 mmol/L (ref 20–29)
Calcium: 9 mg/dL (ref 8.7–10.3)
Chloride: 108 mmol/L — ABNORMAL HIGH (ref 96–106)
Creatinine, Ser: 0.8 mg/dL (ref 0.57–1.00)
GFR calc Af Amer: 83 mL/min/{1.73_m2} (ref >59)
GFR calc non Af Amer: 72 mL/min/{1.73_m2} (ref >59)
Globulin, Total: 3.3 g/dL (ref 1.5–4.5)
Glucose: 96 mg/dL (ref 65–99)
Potassium: 4 mmol/L (ref 3.5–5.2)
Sodium: 145 mmol/L — ABNORMAL HIGH (ref 134–144)
Total Protein: 6.9 g/dL (ref 6.0–8.5)

## 2019-01-02 LAB — LIPID PANEL
Chol/HDL Ratio: 3.9 ratio (ref 0.0–4.4)
Cholesterol, Total: 203 mg/dL — ABNORMAL HIGH (ref 100–199)
HDL: 52 mg/dL (ref >39)
LDL Calculated: 131 mg/dL — ABNORMAL HIGH (ref 0–99)
Triglycerides: 102 mg/dL (ref 0–149)
VLDL Cholesterol Cal: 20 mg/dL (ref 5–40)

## 2019-01-02 LAB — HEMOGLOBIN A1C
Est. average glucose Bld gHb Est-mCnc: 103 mg/dL
Hgb A1c MFr Bld: 5.2 % (ref 4.8–5.6)

## 2019-01-02 LAB — TSH: TSH: 0.896 u[IU]/mL (ref 0.450–4.500)

## 2019-01-05 ENCOUNTER — Telehealth: Payer: Self-pay | Admitting: Family Medicine

## 2019-01-05 ENCOUNTER — Ambulatory Visit: Payer: Self-pay

## 2019-01-05 NOTE — Telephone Encounter (Signed)
Per agent: "Pt's daughter discovered there was a positive tested person for covid at her place of employment. She lives with the Pt and wanted a letter for work to [employer]. Questioning if she needs to be screened due to possible contact with positive covid person and due to the fact that she lives with her mother that has a home health nurse from [place]. The home health nurse will not come out to the home until after the Pt's daughter has quarantined for 14 days or after (may 18th) "

## 2019-01-05 NOTE — Telephone Encounter (Signed)
Copied from [place] [phone]. Topic: General - Inquiry >> Jan 05, 2019  2:30 PM Virl Axe D wrote: Reason for CRM: Pt's sister Mechele Claude stated she would like to speak with a nurse about pt's recent appointment on 01/01/19 and her state of health. Sister on [place]. Please advise. CB#[phone]

## 2019-01-05 NOTE — Telephone Encounter (Signed)
Patient's daughter [name] called, left VM to return call to [place], phone number left on VM.

## 2019-01-06 ENCOUNTER — Telehealth: Payer: Self-pay | Admitting: Family Medicine

## 2019-01-06 NOTE — Telephone Encounter (Signed)
Pts daughter dropped off FMLA paperwork. Due to someone at her work contracting COVID-19, the patients daughter is out of work and she is taking care of ms. Boom at this time. She is going to call in the morning 01/07/2019 to pay $15 fee for FMLA. So paperwork will be at the front desk until patient pays fee.

## 2019-01-06 NOTE — Telephone Encounter (Signed)
Copied from [place] [phone]. Topic: General - Other >> Jan 05, 2019  5:27 PM Alanda Slim E wrote: Reason for CRM: daughter called and stated that their was someone that tested positive at her place of employment at [employer]. Pt lives with the daughter and has home health nurse from [place] that will not be able to come back out to the home until the daughter has quarantined for 57 days(May 18th). Pt's daughter wants to know if she can get a letter for her employer stating that she has to quarantine and stay at home due to the out break and her caring for the Pt/ please advise.   Pt also has FMLA paper work that needs to be filled out

## 2019-01-07 NOTE — Telephone Encounter (Signed)
@   11:15 am Spoke with daughter [name] and she advises Dr. Holly Bodily gave her a note for quarantine. Dgaddy, CMA

## 2019-01-08 NOTE — Telephone Encounter (Signed)
Waiting for patient's daughter to pay 15 dollar fee to get forms filled out for FMLA for daughter due to her being caretaker.

## 2019-01-15 DIAGNOSIS — I872 Venous insufficiency (chronic) (peripheral): Secondary | ICD-10-CM | POA: Diagnosis not present

## 2019-01-15 DIAGNOSIS — M2550 Pain in unspecified joint: Secondary | ICD-10-CM | POA: Diagnosis not present

## 2019-01-15 DIAGNOSIS — S52502D Unspecified fracture of the lower end of left radius, subsequent encounter for closed fracture with routine healing: Secondary | ICD-10-CM | POA: Diagnosis not present

## 2019-01-15 DIAGNOSIS — M1380 Other specified arthritis, unspecified site: Secondary | ICD-10-CM | POA: Diagnosis not present

## 2019-01-27 ENCOUNTER — Telehealth: Payer: Self-pay | Admitting: Family Medicine

## 2019-01-27 NOTE — Telephone Encounter (Signed)
Copied from [place] [id number]. Topic: Medical Record Request - Provider/Facility Request >> Jan 27, 2019  9:53 AM Erick Blinks wrote: Patient Name/DOB/MRN #: [name]. [place] / [birth year] / [id number]  Requestor Name/Agency: [facility]  Call Back #: [phone] Information Requested: Inquiring to see if fax for compression pump prescription has been received. Please advise    Route to [place] for [place] clinics. For all other clinics, route to the clinic's PEC Pool.

## 2019-01-29 NOTE — Telephone Encounter (Signed)
[place] with [facility] called to see if the fax for compression pump was received. Cb# [phone]  Lovena Le

## 2019-02-02 NOTE — Telephone Encounter (Signed)
Dr. Nolon Rod has forms for completion and will be placed in the "to be faxed box". Dgaddy, CMA

## 2019-02-15 ENCOUNTER — Telehealth: Payer: Self-pay | Admitting: Family Medicine

## 2019-02-15 NOTE — Telephone Encounter (Signed)
Spoke with dr. Nolon Rod she will call [facility] at [phone] to give final approval so hospital bed can be delivered to pt. Dr Nolon Rod agreeable and is calling now to given final approval.  Ms. Theda Sers pt's daughter is calling for assistance with this -advised as soon as dr Nolon Rod gives final approval and will have her let me know and call her back.  Agreeable. Ms. Theda Sers call back [phone] Dgaddy, CMA

## 2019-02-15 NOTE — Telephone Encounter (Signed)
Copied from [place] [phone]. Topic: General - Other >> Feb 15, 2019  2:14 PM Celene Kras A wrote: Reason for CRM: Pts sister called requesting a hospital bed for pt. Please advise.  Callback # [phone] [facility].

## 2019-02-15 NOTE — Telephone Encounter (Signed)
Lm on vm for [name] at [phone] that Dr. Nolon Rod got the ok that everything had been finalized on the hospital bed and the issue was with the address on the insurance card but all has been finalized.  Advised to c/b with any further questions or concerns. Dgaddy, CMA

## 2019-02-18 ENCOUNTER — Telehealth: Payer: Self-pay | Admitting: Family Medicine

## 2019-02-18 NOTE — Telephone Encounter (Signed)
Copied from [place] [phone]. Topic: General - Other >> Feb 17, 2019  2:03 PM Rainey Pines A wrote: Patient ordered a new hospital bed and needs doctor or nurse to contact [place] in regards to bed being delivered. [facility] will not deliver the bed until the address has been verified by staff.

## 2019-02-20 DIAGNOSIS — M2550 Pain in unspecified joint: Secondary | ICD-10-CM | POA: Diagnosis not present

## 2019-02-20 DIAGNOSIS — M1380 Other specified arthritis, unspecified site: Secondary | ICD-10-CM | POA: Diagnosis not present

## 2019-02-20 DIAGNOSIS — I872 Venous insufficiency (chronic) (peripheral): Secondary | ICD-10-CM | POA: Diagnosis not present

## 2019-02-20 DIAGNOSIS — S52502D Unspecified fracture of the lower end of left radius, subsequent encounter for closed fracture with routine healing: Secondary | ICD-10-CM | POA: Diagnosis not present

## 2019-02-23 NOTE — Telephone Encounter (Signed)
Spoke with [name] at [facility] and he advises bed was delivered on 02/20/2019 and was signed for by C. Collins.

## 2019-03-02 NOTE — Telephone Encounter (Signed)
Called no answer will try again later

## 2019-03-02 NOTE — Telephone Encounter (Signed)
[name] calling to advise her employer did not receive copy of her FMLA paperwork.  There is not a fax number and she believes this is was never faxed. Can you resend to   [name] Fax:  [phone]   [name] had to call out for her intermittant FMLA. And they told her she did not have, they never received.

## 2019-03-03 ENCOUNTER — Telehealth: Payer: Self-pay | Admitting: Family Medicine

## 2019-03-03 NOTE — Telephone Encounter (Signed)
Copied from [place] [phone]. Topic: General - Other >> Mar 02, 2019  4:30 PM Yvette Rack wrote: Reason for CRM: Pt stated she had a call from the office. Pt requested call back.

## 2019-03-04 ENCOUNTER — Telehealth: Payer: Self-pay | Admitting: Family Medicine

## 2019-03-04 NOTE — Telephone Encounter (Signed)
Called and LVM for pt daugther to give our office a call about paper work.

## 2019-03-04 NOTE — Telephone Encounter (Signed)
PT daughter is calling fmla needs to be for 6 month not 4/28-2020 until 01-18-2019. Please fax [phone] attn sabrena. The FMLA forms need to be redone. Pt daughter already paid for the forms to be complete. The form was complete lisa corum

## 2019-03-10 NOTE — Telephone Encounter (Signed)
Caller name:  Allena Earing / [place]  Relation to pt: daughter / son in law Call back number: [phone]    Reason for call:  Checking on the status of FMLA paperwork and states employer informed patient FMLA is no longer valid, patient daughter would like to discuss further. Informed caller to please call employer directly to find out what exactly is needed to reinstate.

## 2019-03-22 DIAGNOSIS — S52502D Unspecified fracture of the lower end of left radius, subsequent encounter for closed fracture with routine healing: Secondary | ICD-10-CM | POA: Diagnosis not present

## 2019-03-22 DIAGNOSIS — M2550 Pain in unspecified joint: Secondary | ICD-10-CM | POA: Diagnosis not present

## 2019-03-22 DIAGNOSIS — I872 Venous insufficiency (chronic) (peripheral): Secondary | ICD-10-CM | POA: Diagnosis not present

## 2019-03-22 DIAGNOSIS — M1380 Other specified arthritis, unspecified site: Secondary | ICD-10-CM | POA: Diagnosis not present

## 2019-03-31 ENCOUNTER — Other Ambulatory Visit: Payer: Self-pay

## 2019-03-31 ENCOUNTER — Emergency Department (HOSPITAL_COMMUNITY): Payer: Medicare Other

## 2019-03-31 ENCOUNTER — Emergency Department (HOSPITAL_COMMUNITY)
Admission: EM | Admit: 2019-03-31 | Discharge: 2019-03-31 | Disposition: A | Discharge status: Home / Self Care | Payer: Medicare Other | Attending: Emergency Medicine | Admitting: Emergency Medicine

## 2019-03-31 DIAGNOSIS — W19XXXA Unspecified fall, initial encounter: Secondary | ICD-10-CM

## 2019-03-31 DIAGNOSIS — M79651 Pain in right thigh: Secondary | ICD-10-CM | POA: Diagnosis not present

## 2019-03-31 DIAGNOSIS — M79604 Pain in right leg: Secondary | ICD-10-CM | POA: Diagnosis present

## 2019-03-31 DIAGNOSIS — Z79899 Other long term (current) drug therapy: Secondary | ICD-10-CM | POA: Diagnosis not present

## 2019-03-31 DIAGNOSIS — I872 Venous insufficiency (chronic) (peripheral): Secondary | ICD-10-CM | POA: Insufficient documentation

## 2019-03-31 DIAGNOSIS — Z91040 Latex allergy status: Secondary | ICD-10-CM | POA: Insufficient documentation

## 2019-03-31 DIAGNOSIS — I83018 Varicose veins of right lower extremity with ulcer other part of lower leg: Secondary | ICD-10-CM | POA: Insufficient documentation

## 2019-03-31 DIAGNOSIS — S8991XA Unspecified injury of right lower leg, initial encounter: Secondary | ICD-10-CM | POA: Diagnosis not present

## 2019-03-31 DIAGNOSIS — I83219 Varicose veins of right lower extremity with both ulcer of unspecified site and inflammation: Secondary | ICD-10-CM | POA: Diagnosis not present

## 2019-03-31 DIAGNOSIS — I83022 Varicose veins of left lower extremity with ulcer of calf: Secondary | ICD-10-CM | POA: Diagnosis not present

## 2019-03-31 DIAGNOSIS — L97909 Non-pressure chronic ulcer of unspecified part of unspecified lower leg with unspecified severity: Secondary | ICD-10-CM

## 2019-03-31 DIAGNOSIS — I83009 Varicose veins of unspecified lower extremity with ulcer of unspecified site: Secondary | ICD-10-CM

## 2019-03-31 DIAGNOSIS — M7989 Other specified soft tissue disorders: Secondary | ICD-10-CM | POA: Diagnosis not present

## 2019-03-31 DIAGNOSIS — I10 Essential (primary) hypertension: Secondary | ICD-10-CM | POA: Insufficient documentation

## 2019-03-31 DIAGNOSIS — F0390 Unspecified dementia without behavioral disturbance: Secondary | ICD-10-CM | POA: Insufficient documentation

## 2019-03-31 DIAGNOSIS — L03115 Cellulitis of right lower limb: Secondary | ICD-10-CM | POA: Diagnosis not present

## 2019-03-31 DIAGNOSIS — Z87891 Personal history of nicotine dependence: Secondary | ICD-10-CM | POA: Insufficient documentation

## 2019-03-31 DIAGNOSIS — S79921A Unspecified injury of right thigh, initial encounter: Secondary | ICD-10-CM | POA: Diagnosis not present

## 2019-03-31 DIAGNOSIS — L97819 Non-pressure chronic ulcer of other part of right lower leg with unspecified severity: Secondary | ICD-10-CM | POA: Diagnosis not present

## 2019-03-31 DIAGNOSIS — E119 Type 2 diabetes mellitus without complications: Secondary | ICD-10-CM | POA: Diagnosis not present

## 2019-03-31 DIAGNOSIS — L97229 Non-pressure chronic ulcer of left calf with unspecified severity: Secondary | ICD-10-CM | POA: Insufficient documentation

## 2019-03-31 DIAGNOSIS — M79661 Pain in right lower leg: Secondary | ICD-10-CM | POA: Diagnosis not present

## 2019-03-31 MED ORDER — DOXYCYCLINE HYCLATE 100 MG PO CAPS: 100.0000 mg | ORAL_CAPSULE | Freq: Two times a day (BID) | ORAL | 0 refills | Status: DC

## 2019-03-31 NOTE — ED Notes (Signed)
Cam (daughter) [phone] Please call for updates Gates Rigg (nurse aide) [phone]

## 2019-03-31 NOTE — ED Triage Notes (Signed)
Pt reports she fell on Wednesday, she tripped over a stool. Pt has 2 wounds to back of R leg. She reports pain to R leg and knee. Pt able to bear weight to get out of car, unable to walk. Pt has home nurse with her

## 2019-03-31 NOTE — ED Provider Notes (Signed)
[facility] EMERGENCY DEPARTMENT Provider Note   CSN: [id number] Arrival date & time: 03/31/19  1256    History   Chief Complaint Chief Complaint  Patient presents with  . Fall    HPI [name] is a 73 y.o. female.     HPI Patient reports she has had chronic problems with swelling and pain in her legs.  She reports she had polio as a child and really has had some different types of issues for most of her life.  She was a right leg is often worse.  She has limited mobility but reports does function assistance and sometimes with a walker.  Her daughter reports at baseline her right knee is extremely stiff, hard to bend and makes transportation difficult.  The patient did fall 5 days ago.  She reports her leg seems like it is gotten more swollen and red since that time.  She reports a lot of pain to the right knee again.  Patient does have in-home nursing assistant.  No other injury reported. Past Medical History:  Diagnosis Date  . Acute pericarditis, unspecified   . Arthritis   . Bilateral swelling of feet   . Cellulitis   . Chronic anemia   . Chronic knee pain    right  . Closed fracture of styloid process of ulna   . Cognitive decline   . Decreased appetite   . Diabetes mellitus    A1C has been normal for past 2 years and takes no diabetic meds at present  . Distal radius fracture 02/04/2016  . ECG abnormal   . Fall   . Hypertension   . Hypoglycemia   . Knee pain, chronic   . Left scapholunate ligament tear   . Lower extremity edema   . Mass of right side of neck   . Microcytic anemia   . Polio   . Radius fracture   . Severe protein-calorie malnutrition ([place])   . Swelling of joint, knee, right   . Type 2 diabetes mellitus, controlled ([place])   . Venous insufficiency     Patient Active Problem List   Diagnosis Date Noted  . Dementia with behavioral problem ([place]) 03/09/2018  . Edema 02/16/2018  . Physical deconditioning 04/04/2017   . Eczema 03/24/2017  . Decreased independence with activities of daily living 03/24/2017  . Toenail deformity 03/24/2017  . Decreased mobility and endurance 02/07/2017  . Cellulitis   . [place] scabies 12/18/2016  . Community acquired pneumonia of right lower lobe of lung ([place])   . Dehydration   . Failure to thrive in adult   . Impetigo   . Rash of entire body 12/06/2016  . Localized swelling, mass, or lump of lower extremity, bilateral 12/06/2016  . Constipation 03/24/2016  . Fall 02/04/2016  . Chronic anemia 02/04/2016  . Cognitive decline 02/04/2016  . Cellulitis of right arm 01/19/2016  . Swelling of joint, knee, right 10/19/2015  . Venous insufficiency of both lower extremities, chronic 10/19/2015  . Bilateral lower extremity edema 05/24/2015  . Type 2 diabetes mellitus, controlled ([place]) 05/24/2015  . Malnutrition of moderate degree ([place]) 06/07/2014  . Acute pericarditis, unspecified 06/07/2014  . Hypoglycemia 06/06/2014  . Fever 06/06/2014  . Knee pain, chronic 06/06/2014  . Mass of right side of neck 06/06/2014  . Severe protein-calorie malnutrition ([place]) 06/06/2014  . Microcytic anemia 06/06/2014    Past Surgical History:  Procedure Laterality Date  . ABDOMINAL HYSTERECTOMY    . OPEN REDUCTION  INTERNAL FIXATION (ORIF) DISTAL RADIAL FRACTURE Left 02/29/2016   Procedure: LEFT DISTAL RADIUS REPAIR OF MALUNION WITH OPEN REDUCTION INTERNAL FIXATION (ORIF);  Surgeon: Iran Planas, MD;  Location: [facility];  Service: Orthopedics;  Laterality: Left;     OB History   No obstetric history on file.      Home Medications    Prior to Admission medications   Medication Sig Start Date End Date Taking? Authorizing Provider  acetaminophen (TYLENOL) 325 MG tablet Take 2 tablets (650 mg total) by mouth every 6 (six) hours as needed for mild pain (or Fever >/= 101). 02/18/18   Geradine Girt, DO  cetirizine (ZYRTEC) 10 MG tablet Take 1 tablet (10 mg total) by mouth daily. 07/28/18    Forrest Moron, MD  clobetasol cream (TEMOVATE) 0.05 % Apply topically. 12/22/16   [provider]  furosemide (LASIX) 40 MG tablet Take 1 tablet (40 mg total) by mouth daily. 01/01/19   Forrest Moron, MD  gabapentin (NEURONTIN) 300 MG capsule Take 1 capsule (300 mg total) by mouth 3 (three) times daily. 01/01/19   Forrest Moron, MD  potassium chloride (K-DUR) 10 MEQ tablet Take 1 tablet (10 mEq total) by mouth daily. 01/01/19   Forrest Moron, MD  silver sulfADIAZINE (SILVADENE) 1 % cream Apply topically. 12/22/16   [provider]  traMADol (ULTRAM) 50 MG tablet Take 1 tablet (50 mg total) by mouth every 6 (six) hours as needed for moderate pain. 07/28/18   Forrest Moron, MD  triamcinolone cream (KENALOG) 0.1 % Apply 1 application topically 2 (two) times daily. 01/01/19   Forrest Moron, MD    Family History Family History  Problem Relation Age of Onset  . Ovarian cancer Mother   . COPD Father   . Hypertension Unknown   . High Cholesterol Unknown   . Diabetes Mellitus II Neg Hx   . Lupus Neg Hx   . Sarcoidosis Neg Hx     Social History Social History   Tobacco Use  . Smoking status: Former Smoker    Packs/day: 0.50    Types: Cigarettes    Quit date: 09/09/1968    Years since quitting: 50.5  . Smokeless tobacco: Never Used  Substance Use Topics  . Alcohol use: No  . Drug use: No     Allergies   Orange concentrate [flavoring agent], Peach [prunus persica], Penicillins, Strawberry extract, Tylenol [acetaminophen], Adhesive [tape], Aspirin, Latex, Other, Pineapple, and Strawberry flavor   Review of Systems Review of Systems 10 Systems reviewed and are negative for acute change except as noted in the HPI.   Physical Exam Updated Vital Signs BP (!) 154/84 (BP Location: Right Arm)   Pulse 90   Temp 99.4 F (37.4 C) (Oral)   Resp 16   SpO2 100%   Physical Exam Constitutional:      Comments: Patient is alert and interactive.  She is  pleasant and cooperative.  She is eating lunch and feeding herself.  No respiratory distress.  Patient does appear very physically deconditioned.  HENT:     Head: Normocephalic and atraumatic.     Mouth/Throat:     Pharynx: Oropharynx is clear.  Eyes:     Extraocular Movements: Extraocular movements intact.  Cardiovascular:     Rate and Rhythm: Normal rate and regular rhythm.  Pulmonary:     Effort: Pulmonary effort is normal.     Breath sounds: Normal breath sounds.  Abdominal:     General:  There is no distension.     Palpations: Abdomen is soft.     Tenderness: There is no abdominal tenderness. There is no guarding.  Musculoskeletal:     Comments: Patient has significant swelling of both lower extremities consistent with chronic venous stasis.  Right slightly greater than left.  Patient also has some internal rotation at the right knee.  She reports this is at baseline for her.  Knees are very large and appear to have significant underlying arthritis.  Patient does have skin changes of chronic venous stasis over all of the lower portions of both extremities but on the right also has some warm blanching erythema over the foot and lower leg.  To the posterior aspect of the upper left calf and behind the knee on the right there are 2 superficial ulcers which appear to be chronic wounds that at this time are mostly healed but have some serous drainage.  There is no significant amount of erosion at this time and skin is closed.  Predominant appearance is for being intact and smooth but fragile scar tissue.  Neurological:     Comments: Patient is alert and interactive.  (She seems to be a fairly coherent historian and her speech is mostly on subject.  Her caregiver reports that she has significant dementia and has good days and bad days.  At this time, patient does not seem strikingly demented.  Cognitive function seems fairly good.)  Patient assists and moving in the stretcher by using upper  extremities.  She however has significant physical deconditioning and limitations to physical capacity.  Psychiatric:        Mood and Affect: Mood normal.      ED Treatments / Results  Labs (all labs ordered are listed, but only abnormal results are displayed) Labs Reviewed - No data to display  EKG None  Radiology Dg Tibia/fibula Right  Result Date: 03/31/2019 CLINICAL DATA:  Right lower extremity pain, swelling and immobility. Fell last night. EXAM: RIGHT TIBIA AND FIBULA - 2 VIEW COMPARISON:  Radiograph 02/15/2018 FINDINGS: Severe degenerative changes noted at the knee joint. No obvious fracture. The tibia and fibula are intact. The ankle joint is maintained. Diffuse soft tissue swelling noted. IMPRESSION: Severe degenerative changes at the knee appears stable. No obvious fracture. No acute fracture of the tibia or fibula is identified. Electronically Signed   By: Marijo Sanes M.D.   On: 03/31/2019 14:19   Dg Femur, Min 2 Views Right  Result Date: 03/31/2019 CLINICAL DATA:  [place] last evening.  Pain and swelling. EXAM: RIGHT FEMUR 2 VIEWS COMPARISON:  None. FINDINGS: The hip and knee joints are maintained. No acute femur fracture is identified. Extensive calcification noted along the course of the iliopsoas tendon IMPRESSION: No acute bony findings. Electronically Signed   By: Marijo Sanes M.D.   On: 03/31/2019 14:21    Procedures Procedures (including critical care time)  Medications Ordered in ED Medications - No data to display   Initial Impression / Assessment and Plan / ED Course  I have reviewed the triage vital signs and the nursing notes.  Pertinent labs & imaging results that were available during my care of the patient were reviewed by me and considered in my medical decision making (see chart for details).        I have reviewed EMR.  Patient's right lower extremity has had chronic problems with waxing and waning venous stasis and at times open wounds.  At this  time no sign  of acute fracture related to patient's reported fall from 5 days ago.  On exam, the right lower extremity does seem more erythematous than the left.  She also has a intact but chronic appearing wound with scant drainage on the back of the leg.  Due to increased pain, erythema and a site for cellulitis development, will opt to treat empirically with antibiotics.  I do feel best management will be for patient to go back to wound care clinic for specialty nursing care dressings and wraps.  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

## 2019-03-31 NOTE — ED Notes (Signed)
Pt in XR. 

## 2019-04-02 ENCOUNTER — Other Ambulatory Visit: Payer: Self-pay | Admitting: *Deleted

## 2019-04-02 ENCOUNTER — Encounter: Payer: Self-pay | Admitting: *Deleted

## 2019-04-02 ENCOUNTER — Other Ambulatory Visit: Payer: Self-pay

## 2019-04-02 NOTE — Patient Outreach (Addendum)
[facility]) Care Management  04/02/2019  Tasheema Perrone 20-Sep-1945 [id number]   Telephone Screen  Referral Date: 04/02/19  Medium Referral Source:UHC UM referral  Referral Reason: [facility] permission to speak with [name] , Daughter at [phone] Daughter states the member has dementia. The member does have a home health aide that they pay for out of pocket. [facility] health aide [facility] from [facility]. Member used to have a home health aide with [facility] but they were expensive. Daughter would like to find resources for assistance with home health. Daughter states the members pcp, Dr Bridget Hartshorn does not do anything for the member nor does she provide any advice . Daughter states Dr Nolon Rod was the one who diagnosed member with dementia but she did not refer the member to a specialist. Daughter says she was told to call [facility] because they will evaluate the member. The daughter states she has called [facility] and is waiting on a call back from them regarding an evaluation. Member fell a few days ago and now has a wound on her right leg. Went to ER being treated with doxycyline.  A [place]  Insurance: united health care medicare  No admissions in the last 6 months ED visit x 1 Last on 03/31/19 for a fall final ED  dx chronic venous stasis dermatitis of RLE, cellulitis of RLE, Chronic cutaneous venous stasis ulcer   Outreach attempt # 1 successful to the daughter, [name] while she is eating dinner  Permission given to speak with [name], daughter as Mrs Mcdermid has dementia. [name] is able to verify HIPAA Reviewed and addressed referral to [facility] with Sherald Hess reports the primary needs for Mrs Heckert at this time is a home health medical staff to complete Mrs Stephen's wound care and dressing changes and a need for home ramp  [name] confirms Mrs Wayment does have a home care aide from [facility] 3 days a week who visits on Mondays, wednesdays and Fridays from 10 am to 2  pm for about 4 hours.   [name] reports she has difficulty reaching Dr Bridget Hartshorn because "she stays busy" and there is not generally time to speak with Dr Nolon Rod  [facility] RN CM reviewed the differences in personal care services, home health and [facility] services as related to cost, time spent in the home and coverage by insurance. CM discussed that [facility] does not provide home health care services and home visits for care coordination is not available at this time related to the covid 19 pandemic. [name] voiced understanding.   [facility] RN CM discussed Medicare guidelines for face to face encounters for home health services with possible waivers during the pandemic but CM would have to consult home care staff/MD office to verify this. [name] voiced understanding of face to face encounters per medicare guidelines [facility] RN CM discussed options for ramp assistance to include having Dr Bridget Hartshorn to order for a DME company like [facility] to assist with a ramp or [facility] SW referral to see if there are any available ramp resources during this covid pandemic time. [name] informs [facility] RN CM she prefers a referral to [place] RN CM sent an Epic in basket message to Dr Bridget Hartshorn requesting assistance with possible home health orders for [facility] RN and [facility] PT for Mrs Rabadi. In the message Dr Minette Brine made aware that [name] voiced she was willing to bringing pt in for office visit prn   [name] denies Mrs Kanaan has  any covid s/s nor has been in contact with andy covid positive person  Social: Mrs Pavia is widowed, retired and lives with her daughter, [name]. She also has support of her sister, Caffie Pinto, but she is 73 per [name]. Her daughter reports requesting FMLA to assist in Mrs Stephen's care recently [name] reports Mrs Pacha has to be assisted with all care needs She is assist to dependent. Mrs Darrough is not able to walk at this time related to her wound on the back of her right leg. [name] reports  that it is tasking to get Mrs Zaragoza out of the home to medical appointments.     Conditions:hx of CAP RLL, hypoglycemia, DM type 2, Dementia, [place] scabies, pericarditis, chronic venous insufficiency of BLE, anemia, edema, right knee effusion, right knee arthritis, hx of closed Fx of styloid process of ulna, HTN, mass on right side of neck, hx of polio, hx of radius Fx, hx of severe protein calorie malnutrition   DME: walker, hospital bed, w/c, compression stockings, eyeglasses  Medications: [name] denies concerns with Mrs Theall taking medications as prescribed, affording medications, side effects of medications and questions about medications [name] reports she had flu shot in 2019 but No flu shot found documented in Epic nor KPN  Appointments: Last time pt seen face to face was possibly in January 2020 per her daughter but had a virtual visit in April 2020 with primary care MD, Dr Burnett Harry  04/21/19 1315 wound care visit   07/02/19 1100 6 month f/u with Dr Nolon Rod Primary care MD  Advance Directives: daughter believes the sister has POA but not completely sure. Unable to tell CM if POA or living will forms available Daughter is interested in getting advance directives started  [facility] RN CM discussed the importance of Mrs Tomkinson needing to be able to comprehend and answer questions for completion of advance directives. [name] states she believes the patient will be able to comprehend and answer questions for completion of advance directives.  Consent: [facility] RN CM reviewed [facility] services with patient. Patient gave verbal consent for services [facility] telephonic RN CM and [facility] SW.   Plan: [facility] RN CM referred Mrs Nusbaum to [facility] SW for assistance with advance directives,  Transportation benefits for united health care and for resources or information related to ramps  [facility] RN CM will consult with primary MD office, collaborate with [facility] SW and follow up with Mrs Nolon Rod and her daughter  within the next 7 days  Pt encouraged to return a call to [place] CM prn  [facility] RN CM sent a successful outreach letter as discussed with [facility] brochure enclosed for review  Routed note to MD  [place]. Lavina Hamman, RN, BSN, [place] Coordinator Office number [phone] Mobile number [phone]  Main [facility] number [phone] Fax number [phone]

## 2019-04-02 NOTE — Telephone Encounter (Signed)
Dr Nolon Rod I do not see the FLMA paper worked scanned in the chart and I do not see this form in your box> Checking to see if this form has been filled out. Patient would like to know the status on this form

## 2019-04-05 ENCOUNTER — Other Ambulatory Visit: Payer: Self-pay | Admitting: *Deleted

## 2019-04-05 ENCOUNTER — Other Ambulatory Visit: Payer: Self-pay

## 2019-04-05 NOTE — Patient Outreach (Signed)
[facility]) Care Management  04/05/2019  Marigrace Mccole Nov 07, 1945 [id number]   Care coordination.Case closure   [facility] RN CM made the second attempt to engage with Mrs Lusignan, Daughter, [name] today 04/05/19   [name], daughter, again informs [facility] RN CM she was not able to speak long She reports being out of town with family.  CM briefly informed her that Dr Nolon Rod office was contacted. She informed CM that the office had contacted her to provided a 04/07/19 1400 appointment for ED hospital f/u and to address all other needs. She thanked [facility] RN CM for services rendered [facility] RN CM without the opportunity to offer suggestions for a better communication with the primary MD after the inquiry about pt other needs as the daughter needed to conclude the call  Plan Case closure agreed upon and completed with Daughter [name]. [facility] RN CM encouraged her to call [facility] RN CM prn if there are other needs for Mrs Besse and reminded her of the pending call from [facility]    Routed note to MD   [place]. Lavina Hamman, RN, BSN, [place] Coordinator Office number [phone] Mobile number [phone]  Main [facility] number [phone] Fax number [phone]

## 2019-04-05 NOTE — Patient Outreach (Signed)
[facility]) Care Management  04/05/2019  Caylei Sperry [birth year] [id number]   Care coordination  Attempt to update pt/daughter cameron Outreach #1  [name] is not able to speak with RN at this time Requests a possible return call later in the day  Plan [facility] RN CM follow up with Mrs Nolon Rod and her daughter within the next 7 days  Chioma Mukherjee L. Lavina Hamman, RN, BSN, [place] Coordinator Office number [phone] Mobile number [phone]  Main [facility] number [phone] Fax number [phone]

## 2019-04-05 NOTE — Patient Outreach (Signed)
[facility]) Care Management  04/05/2019  Chrystel Barefield May 28, 1946 [id number]   Care coordination  [facility] RN CM called Dr Nolon Rod office to update staff on patient concerns (medical [facility] staff for wound care, ramp) voiced by her daughter, [name] on 04/02/19 and to request assist prn  [facility] RN CM was able to speak with Edwena Blow, RN to review the [facility] referral, the above mentioned concerns and also an ED f/u appointment. Edwena Blow was able to confirm the Mrs Petrella was seen last for an office visit on 01/01/19 (they took pictures of her wound) and agrees to assist with an office visit for ED f/u. [facility] RN CM made her aware that [place] will be offering information on a ramp and CM will work with [name] on possible [employer] with MD  Plan [facility] RN CM follow up with Mrs Nolon Rod and her daughter within the next 7 days   Demetra Moya L. Lavina Hamman, RN, BSN, [place] Coordinator Office number [phone] Mobile number [phone]  Main [facility] number [phone] Fax number [phone]

## 2019-04-06 ENCOUNTER — Other Ambulatory Visit: Payer: Self-pay

## 2019-04-06 NOTE — Patient Outreach (Signed)
[name]) Care Management  04/06/2019  [name] [birth year] [id number]   Social work referral received from [facility], Joellyn Quails.  "Patient needs for assistance with advance directives, Transportation benefits for united health care (has not used benefit per daughter) and for possible available resources or information related to ramps" Successful outreach to patient's daughter today.   BSW and daughter discussed [facility] POA and Living Will.  Daughter also inquired about financial POA.  BSW encouraged her to contact Legal Aid and provided her with contact information.  BSW will mail Advance Directive EMMI and packet.   Daughter reports that patient has transportation, however, it is very difficult getting her to and from vehicle due to lack of ramp.  BSW educated daughter about [address].  Three way call was conducted and referral was submitted.  Daughter will receive paperwork via mail to complete and send back. BSW also educated daughter about services through [place] and she consented to referral being submitted.   BSW will follow up with daughter within the next two weeks to ensure receipt of Advance Directives and to follow up on ramp referrals to [facility] and Independent Living.  Ronn Melena, BSW Social Worker [phone]

## 2019-04-07 ENCOUNTER — Ambulatory Visit (INDEPENDENT_AMBULATORY_CARE_PROVIDER_SITE_OTHER): Payer: Medicare Other | Admitting: Family Medicine

## 2019-04-07 ENCOUNTER — Other Ambulatory Visit: Payer: Self-pay

## 2019-04-07 ENCOUNTER — Encounter: Payer: Self-pay | Admitting: Family Medicine

## 2019-04-07 VITALS — BP 122/74 | HR 93 | Temp 99.0°F | Resp 17 | Ht 67.0 in

## 2019-04-07 DIAGNOSIS — G4709 Other insomnia: Secondary | ICD-10-CM | POA: Diagnosis not present

## 2019-04-07 DIAGNOSIS — I872 Venous insufficiency (chronic) (peripheral): Secondary | ICD-10-CM | POA: Diagnosis not present

## 2019-04-07 DIAGNOSIS — R609 Edema, unspecified: Secondary | ICD-10-CM | POA: Diagnosis not present

## 2019-04-07 DIAGNOSIS — Z7409 Other reduced mobility: Secondary | ICD-10-CM | POA: Diagnosis not present

## 2019-04-07 DIAGNOSIS — R221 Localized swelling, mass and lump, neck: Secondary | ICD-10-CM

## 2019-04-07 DIAGNOSIS — Z1211 Encounter for screening for malignant neoplasm of colon: Secondary | ICD-10-CM

## 2019-04-07 DIAGNOSIS — E11620 Type 2 diabetes mellitus with diabetic dermatitis: Secondary | ICD-10-CM

## 2019-04-07 MED ORDER — QUETIAPINE FUMARATE 50 MG PO TABS: 25.0000 mg | ORAL_TABLET | Freq: Every day | ORAL | 3 refills | Status: DC

## 2019-04-07 NOTE — Patient Instructions (Signed)
° ° ° °  If you have lab work done today you will be contacted with your lab results within the next 2 weeks.  If you have not heard from us then please contact us. The fastest way to get your results is to register for My Chart. ° ° °IF you received an x-ray today, you will receive an invoice from [facility]. Please contact [facility] at [phone] with questions or concerns regarding your invoice.  ° °IF you received labwork today, you will receive an invoice from [facility]. Please contact [facility] at [phone] with questions or concerns regarding your invoice.  ° °Our billing staff will not be able to assist you with questions regarding bills from these companies. ° °You will be contacted with the lab results as soon as they are available. The fastest way to get your results is to activate your My Chart account. Instructions are located on the last page of this paperwork. If you have not heard from us regarding the results in 2 weeks, please contact this office. °  ° ° ° °

## 2019-04-07 NOTE — Progress Notes (Signed)
Established Patient Office Visit  Subjective:  Patient ID: [name], female    DOB: 1946-03-29  Age: 73 y.o. MRN: [id number]  CC:  Chief Complaint  Patient presents with  . Hospitalization Follow-up    fall.  per pt her weight is 130 lbs. she was 194 lbs. 3 months ago.  Pt unable to stand to check her weight it 130 lbs is correct pt has lost a lot of weight over a short period of time.  . Medication Refill    lasix, k-dur, ultram and kenalog    HPI [name] presents for  Patient's aide [name] is here with patient   Venous Insufficiency Patient reports that she was told she was 130 lbs and is concerned about this weight loss She was 194 lbs 3 months ago.  She cannot stand due to the lymphedema and neuropathy. She is concerned about this weight loss.  Wt Readings from Last 3 Encounters:  01/01/19 194 lb 3.2 oz (88.1 kg)  07/31/18 200 lb (90.7 kg)  07/28/18 200 lb 9.6 oz (91 kg)     She reports that she has continued pain and was told that she should wear her compression stockings but they are too uncomfortable and itchy.  She states that the skin is so tight and is opening She spend most of the day sitting in a chair at home without the leg elevated.   Diabetes Mellitus: Patient presents for follow up of diabetes. Symptoms: paresthesia of the feet. Symptoms have stabilized. Patient denies foot ulcerations, hyperglycemia, hypoglycemia , increase appetite and nausea.  Evaluation to date has been included: hemoglobin A1C.  Home sugars: patient does not check sugars.  Lab Results  Component Value Date   HGBA1C 5.2 01/01/2019     Insomnia She has a difficulty time with sleeping and reports that she naps during the day She states that she would like to get something to help her with her sleep routine She denies apnea and her caregiver also denies episodes of witnessed apnea There is no alcohol use She has no hallucination   Neck  mass Pt reports that her neck mass looks about the same She feel no pain or difficulty swallowing She denies pressure and mostly is sitting up so there is not mass effect noted She states that it might be slightly bigger but she is not sure  IMPRESSION: 1. 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 There are no aggressive features. 2. No significant adenopathy. 3. Minimal interval growth of a thyroid goiter. 4. Minimal interval growth of a right peritracheal mass. This is most likely related to the thyroid goiter although a direct connection is difficult to identify.   Electronically Signed   By: [name] M.D.   On: 06/07/2014 11:45  Past Medical History:  Diagnosis Date  . Acute pericarditis, unspecified   . Arthritis   . Bilateral swelling of feet   . Cellulitis   . Chronic anemia   . Chronic knee pain    right  . Closed fracture of styloid process of ulna   . Cognitive decline   . Decreased appetite   . Diabetes mellitus    A1C has been normal for past 2 years and takes no diabetic meds at present  . Distal radius fracture 02/04/2016  . ECG abnormal   .  Fall   . Hypertension   . Hypoglycemia   . Knee pain, chronic   . Left scapholunate ligament tear   . Lower extremity edema   . Mass of right side of neck   . Microcytic anemia   . Polio   . Radius fracture   . Severe protein-calorie malnutrition ([place])   . Swelling of joint, knee, right   . Type 2 diabetes mellitus, controlled ([place])   . Venous insufficiency     Past Surgical History:  Procedure Laterality Date  . ABDOMINAL HYSTERECTOMY    . OPEN REDUCTION INTERNAL FIXATION (ORIF) DISTAL RADIAL FRACTURE Left 02/29/2016   Procedure: LEFT DISTAL RADIUS REPAIR OF MALUNION WITH OPEN REDUCTION INTERNAL FIXATION (ORIF);  Surgeon: Janet Planas, MD;  Location: [facility];   Service: Orthopedics;  Laterality: Left;    Family History  Problem Relation Age of Onset  . Ovarian cancer Mother   . COPD Father   . Hypertension Unknown   . High Cholesterol Unknown   . Diabetes Mellitus II Neg Hx   . Lupus Neg Hx   . Sarcoidosis Neg Hx     Social History   Socioeconomic History  . Marital status: Widowed    Spouse name: Not on file  . Number of children: 2  . Years of education: 10  . Highest education level: Not on file  Occupational History  . Occupation: retired  Scientific laboratory technician  . Financial resource strain: Not hard at all  . Food insecurity    Worry: Never true    Inability: Never true  . Transportation needs    Medical: No    Non-medical: No  Tobacco Use  . Smoking status: Former Smoker    Packs/day: 0.50    Types: Cigarettes    Quit date: 09/09/1968    Years since quitting: 50.6  . Smokeless tobacco: Never Used  Substance and Sexual Activity  . Alcohol use: No  . Drug use: No  . Sexual activity: Never  Lifestyle  . Physical activity    Days per week: 0 days    Minutes per session: 0 min  . Stress: Not on file  Relationships  . Social Herbalist on phone: Not on file    Gets together: Not on file    Attends religious service: Not on file    Active member of club or organization: Not on file    Attends meetings of clubs or organizations: Not on file    Relationship status: Not on file  . Intimate partner violence    Fear of current or ex partner: Not on file    Emotionally abused: Not on file    Physically abused: Not on file    Forced sexual activity: Not on file  Other Topics Concern  . Not on file  Social History Narrative   Has family who helps her get around.     Fun: Read   Denies abuse and feel safe at home.    [name] is widowed, retired and lives with her daughter, [name]. She also has support of her sister, [name], but she is 59 per [name].   [name] reports [name] has to be assisted with  all care needs She is assist to dependent.    Outpatient Medications Prior to Visit  Medication Sig Dispense Refill  . acetaminophen (TYLENOL) 325 MG tablet Take 2 tablets (650 mg total) by mouth every 6 (six) hours as needed for mild pain (or  Fever >/= 101).    . cetirizine (ZYRTEC) 10 MG tablet Take 1 tablet (10 mg total) by mouth daily. 30 tablet 11  . clobetasol cream (TEMOVATE) 0.05 % Apply topically.    Marland Kitchen doxycycline (VIBRAMYCIN) 100 MG capsule Take 1 capsule (100 mg total) by mouth 2 (two) times daily. One po bid x 7 days 14 capsule 0  . furosemide (LASIX) 40 MG tablet Take 1 tablet (40 mg total) by mouth daily. 90 tablet 1  . gabapentin (NEURONTIN) 300 MG capsule Take 1 capsule (300 mg total) by mouth 3 (three) times daily. 90 capsule 1  . potassium chloride (K-DUR) 10 MEQ tablet Take 1 tablet (10 mEq total) by mouth daily. 90 tablet 1  . silver sulfADIAZINE (SILVADENE) 1 % cream Apply topically.    . traMADol (ULTRAM) 50 MG tablet Take 1 tablet (50 mg total) by mouth every 6 (six) hours as needed for moderate pain. 10 tablet 0  . triamcinolone cream (KENALOG) 0.1 % Apply 1 application topically 2 (two) times daily. 80 g 0   No facility-administered medications prior to visit.     Allergies  Allergen Reactions  . Orange Concentrate [Flavoring Agent] Shortness Of Breath, Itching and Swelling  . Peach [Prunus Persica] Shortness Of Breath, Itching and Swelling  . Penicillins Anaphylaxis and Hives    All 'cillins' Has patient had a PCN reaction causing immediate rash, facial/tongue/throat swelling, SOB or lightheadedness with hypotension: Yes Has patient had a PCN reaction causing severe rash involving mucus membranes or skin necrosis: No Has patient had a PCN reaction that required hospitalization No Has patient had a PCN reaction occurring within the last 10 years: No If all of the above answers are "NO", then may proceed with Cephalosporin use.   . Strawberry Extract Shortness Of  Breath, Itching and Swelling  . Tylenol [Acetaminophen] Itching    Per patient makes her itch  . Adhesive [Tape] Itching and Rash    Please use "paper" tape  . Aspirin Itching  . Latex Itching and Rash  . Other Hives, Itching and Rash    NO "-CILLINS"  . Pineapple Itching and Rash  . Strawberry Flavor Itching and Rash    ROS Review of Systems Review of Systems  Constitutional: Negative for activity change, appetite change, chills and fever.  HENT: Negative for congestion, nosebleeds, trouble swallowing and voice change.   Respiratory: Negative for cough, shortness of breath and wheezing.   Gastrointestinal: Negative for diarrhea, nausea and vomiting.  Genitourinary: Negative for difficulty urinating, dysuria, flank pain and hematuria.  Musculoskeletal: Negative for back pain, joint swelling and neck pain.  Neurological: Negative for dizziness, speech difficulty, light-headedness and numbness.  See HPI. All other review of systems negative.     Objective:    Physical Exam  BP 122/74 (BP Location: Right Arm, Patient Position: Sitting, Cuff Size: Normal)   Pulse 93   Temp 99 F (37.2 C) (Oral)   Resp 17   Ht 5\' 7"  (1.702 m)   SpO2 99%   BMI 30.42 kg/m  Wt Readings from Last 3 Encounters:  01/01/19 194 lb 3.2 oz (88.1 kg)  07/31/18 200 lb (90.7 kg)  07/28/18 200 lb 9.6 oz (91 kg)    Physical Exam  Constitutional: Oriented to person, place, and time. Appears well-developed and well-nourished. Patient with difficulty with standing, with 2 people assisting she is unable to weight-bear. HENT:  Head: Normocephalic and atraumatic.  Eyes: Conjunctivae and EOM are normal.  Cardiovascular: Normal rate,  regular rhythm, normal heart sounds and intact distal pulses.  No murmur heard. Pulmonary/Chest: Effort normal and breath sounds normal. No stridor. No respiratory distress. Has no wheezes.  Abdomen: non-distended, normoactive bs, soft, non-tender Neurological: Is alert and  oriented to person, place, and time. difficulty with balance and gait due to severe edema pitting to the knee Skin: Skin is warm. Capillary refill takes less than 2 seconds.  Skin with breakdown superficially without exposure of the dermis. Psychiatric: Has a normal mood and affect. Behavior is normal. Judgment and thought content normal.     Health Maintenance Due  Topic Date Due  . FOOT EXAM  11/22/1953  . OPHTHALMOLOGY EXAM  11/22/1953  . URINE MICROALBUMIN  11/22/1953  . TETANUS/TDAP  11/23/1962  . COLONOSCOPY  11/22/1993  . DEXA SCAN  11/22/2008  . PNA vac Low Risk Adult (1 of 2 - PCV13) 11/22/2008    There are no preventive care reminders to display for this patient.  Lab Results  Component Value Date   TSH 0.896 01/01/2019   Lab Results  Component Value Date   WBC 4.6 01/01/2019   HGB 10.1 (L) 01/01/2019   HCT 32.2 (L) 01/01/2019   MCV 73 (L) 01/01/2019   PLT 227 01/01/2019   Lab Results  Component Value Date   NA 145 (H) 01/01/2019   K 4.0 01/01/2019   CO2 25 01/01/2019   GLUCOSE 96 01/01/2019   BUN 19 01/01/2019   CREATININE 0.80 01/01/2019   BILITOT 0.3 01/01/2019   ALKPHOS 92 01/01/2019   AST 10 01/01/2019   ALT 7 01/01/2019   PROT 6.9 01/01/2019   ALBUMIN 3.6 (L) 01/01/2019   CALCIUM 9.0 01/01/2019   ANIONGAP 6 02/18/2018   GFR 102.02 03/24/2017   Lab Results  Component Value Date   CHOL 203 (H) 01/01/2019   Lab Results  Component Value Date   HDL 52 01/01/2019   Lab Results  Component Value Date   LDLCALC 131 (H) 01/01/2019   Lab Results  Component Value Date   TRIG 102 01/01/2019   Lab Results  Component Value Date   CHOLHDL 3.9 01/01/2019   Lab Results  Component Value Date   HGBA1C 5.2 01/01/2019      Assessment & Plan:   Problem List Items Addressed This Visit      Cardiovascular and Mediastinum   Venous insufficiency of both lower extremities, chronic - Primary   Relevant Orders   Ambulatory referral to Home Health      Endocrine   Type 2 diabetes mellitus, controlled ([place])   Relevant Orders   Ambulatory referral to [facility]     Other   Edema   Relevant Orders   Ambulatory referral to [place]    Other Visit Diagnoses    Decreased ambulation status       Relevant Orders   Ambulatory referral to [facility]   Other insomnia       Relevant Medications   QUEtiapine (SEROQUEL) 50 MG tablet   Other Relevant Orders   Ambulatory referral to ENT   Neck mass         NECK MASS - Two CTs documenting parotid neck mass Will refer to ENT Without wig in place the mass is very visible and well circumscribed It still appears benign but will refer for formal evaulation  Type 2 Diabetes Diabetes stable  Chronic venous insufficiency Venous ulcers  Pt has appt 04/21/2019 with wound clinic Will offer home health Completed FMLA  so patient's daughter can go with her to appt Patient advised that home health will help with wound care  Weight is stable Unlike the 130# weight reported by family  Patient's weight is quite stable Without evidence of malnourishment    Wt Readings from Last 3 Encounters:  01/01/19 194 lb 3.2 oz (88.1 kg)  07/31/18 200 lb (90.7 kg)  07/28/18 200 lb 9.6 oz (91 kg)    Meds ordered this encounter  Medications  . QUEtiapine (SEROQUEL) 50 MG tablet    Sig: Take 0.5-1 tablets (25-50 mg total) by mouth at bedtime.    Dispense:  30 tablet    Refill:  3    Follow-up: No follow-ups on file.   A total of 45 minutes were spent face-to-face with the patient during this encounter and over half of that time was spent on counseling and coordination of care.  Forrest Moron, MD

## 2019-04-08 LAB — CBC
Hematocrit: 32.2 % — ABNORMAL LOW (ref 34.0–46.6)
Hemoglobin: 9.7 g/dL — ABNORMAL LOW (ref 11.1–15.9)
MCH: 22 pg — ABNORMAL LOW (ref 26.6–33.0)
MCHC: 30.1 g/dL — ABNORMAL LOW (ref 31.5–35.7)
MCV: 73 fL — ABNORMAL LOW (ref 79–97)
Platelets: 256 10*3/uL (ref 150–450)
RBC: 4.41 x10E6/uL (ref 3.77–5.28)
RDW: 14.5 % (ref 11.7–15.4)
WBC: 5.2 10*3/uL (ref 3.4–10.8)

## 2019-04-08 LAB — CMP14+EGFR
ALT: 8 IU/L (ref 0–32)
AST: 13 IU/L (ref 0–40)
Albumin/Globulin Ratio: 1.1 — ABNORMAL LOW (ref 1.2–2.2)
Albumin: 3.7 g/dL (ref 3.7–4.7)
Alkaline Phosphatase: 89 IU/L (ref 39–117)
BUN/Creatinine Ratio: 20 (ref 12–28)
BUN: 16 mg/dL (ref 8–27)
Bilirubin Total: 0.3 mg/dL (ref 0.0–1.2)
CO2: 24 mmol/L (ref 20–29)
Calcium: 8.9 mg/dL (ref 8.7–10.3)
Chloride: 105 mmol/L (ref 96–106)
Creatinine, Ser: 0.79 mg/dL (ref 0.57–1.00)
GFR calc Af Amer: 85 mL/min/{1.73_m2} (ref >59)
GFR calc non Af Amer: 73 mL/min/{1.73_m2} (ref >59)
Globulin, Total: 3.5 g/dL (ref 1.5–4.5)
Glucose: 134 mg/dL — ABNORMAL HIGH (ref 65–99)
Potassium: 3.7 mmol/L (ref 3.5–5.2)
Sodium: 144 mmol/L (ref 134–144)
Total Protein: 7.2 g/dL (ref 6.0–8.5)

## 2019-04-12 NOTE — Progress Notes (Signed)
Spoke with daughter Queen Blossom. Also informed daughter that she will need to fill out another release form for her mom. She will be in soon to do so. [name] can be reached at [phone]

## 2019-04-20 ENCOUNTER — Other Ambulatory Visit: Payer: Self-pay

## 2019-04-20 ENCOUNTER — Ambulatory Visit: Payer: Self-pay

## 2019-04-20 NOTE — Patient Outreach (Signed)
[facility]) Care Management  04/20/2019  [name] [birth year] [id number]   Secure message sent to [name] with Independent [facility] regarding status of referral for ramp. Will follow up with patient when response is received.   Ronn Melena, BSW Social Worker [phone]

## 2019-04-21 ENCOUNTER — Encounter (HOSPITAL_BASED_OUTPATIENT_CLINIC_OR_DEPARTMENT_OTHER): Payer: Medicare Other | Attending: Physician Assistant

## 2019-04-21 DIAGNOSIS — E11622 Type 2 diabetes mellitus with other skin ulcer: Secondary | ICD-10-CM | POA: Diagnosis not present

## 2019-04-21 DIAGNOSIS — Z87891 Personal history of nicotine dependence: Secondary | ICD-10-CM | POA: Insufficient documentation

## 2019-04-21 DIAGNOSIS — I872 Venous insufficiency (chronic) (peripheral): Secondary | ICD-10-CM | POA: Diagnosis not present

## 2019-04-21 DIAGNOSIS — L539 Erythematous condition, unspecified: Secondary | ICD-10-CM | POA: Diagnosis not present

## 2019-04-21 DIAGNOSIS — E1151 Type 2 diabetes mellitus with diabetic peripheral angiopathy without gangrene: Secondary | ICD-10-CM | POA: Diagnosis not present

## 2019-04-21 DIAGNOSIS — L97512 Non-pressure chronic ulcer of other part of right foot with fat layer exposed: Secondary | ICD-10-CM | POA: Diagnosis not present

## 2019-04-21 DIAGNOSIS — I890 Lymphedema, not elsewhere classified: Secondary | ICD-10-CM | POA: Diagnosis not present

## 2019-04-21 DIAGNOSIS — I10 Essential (primary) hypertension: Secondary | ICD-10-CM | POA: Insufficient documentation

## 2019-04-21 DIAGNOSIS — L97812 Non-pressure chronic ulcer of other part of right lower leg with fat layer exposed: Secondary | ICD-10-CM | POA: Diagnosis not present

## 2019-04-22 DIAGNOSIS — I872 Venous insufficiency (chronic) (peripheral): Secondary | ICD-10-CM | POA: Diagnosis not present

## 2019-04-22 DIAGNOSIS — S52502D Unspecified fracture of the lower end of left radius, subsequent encounter for closed fracture with routine healing: Secondary | ICD-10-CM | POA: Diagnosis not present

## 2019-04-22 DIAGNOSIS — I87311 Chronic venous hypertension (idiopathic) with ulcer of right lower extremity: Secondary | ICD-10-CM | POA: Diagnosis not present

## 2019-04-22 DIAGNOSIS — M1380 Other specified arthritis, unspecified site: Secondary | ICD-10-CM | POA: Diagnosis not present

## 2019-04-22 DIAGNOSIS — M2550 Pain in unspecified joint: Secondary | ICD-10-CM | POA: Diagnosis not present

## 2019-04-23 ENCOUNTER — Other Ambulatory Visit: Payer: Self-pay

## 2019-04-23 NOTE — Patient Outreach (Addendum)
[facility]) Care Management  04/23/2019  [name] [birth year] [id number]   Follow up call to patient's daughter today regarding referrals to [facility] and Independent Living for ramp and to ensure receipt of Advance Directives. Daughter was contacted by [name] with Independent [facility] and paperwork is being sent to her. Daughter did receive paperwork from [facility] but misplaced it.  Contact  [facility] and requested that they mail it again. Daughter confirmed receipt of Advance Directives and said that she needs assistance with completing it.  She requested a call back later today.   Called daughter back at 2:40 PM but had to leave voicemail message.      Ronn Melena, BSW Social Worker [phone]

## 2019-04-27 ENCOUNTER — Other Ambulatory Visit: Payer: Self-pay | Admitting: *Deleted

## 2019-04-28 ENCOUNTER — Other Ambulatory Visit: Payer: Self-pay | Admitting: Physician Assistant

## 2019-04-28 DIAGNOSIS — L97819 Non-pressure chronic ulcer of other part of right lower leg with unspecified severity: Secondary | ICD-10-CM | POA: Diagnosis not present

## 2019-04-28 DIAGNOSIS — E11622 Type 2 diabetes mellitus with other skin ulcer: Secondary | ICD-10-CM | POA: Diagnosis not present

## 2019-04-28 DIAGNOSIS — L97812 Non-pressure chronic ulcer of other part of right lower leg with fat layer exposed: Secondary | ICD-10-CM | POA: Diagnosis not present

## 2019-04-28 DIAGNOSIS — I10 Essential (primary) hypertension: Secondary | ICD-10-CM | POA: Diagnosis not present

## 2019-04-28 DIAGNOSIS — I872 Venous insufficiency (chronic) (peripheral): Secondary | ICD-10-CM | POA: Diagnosis not present

## 2019-04-28 DIAGNOSIS — I890 Lymphedema, not elsewhere classified: Secondary | ICD-10-CM | POA: Diagnosis not present

## 2019-04-28 DIAGNOSIS — E1151 Type 2 diabetes mellitus with diabetic peripheral angiopathy without gangrene: Secondary | ICD-10-CM | POA: Diagnosis not present

## 2019-04-28 DIAGNOSIS — L97512 Non-pressure chronic ulcer of other part of right foot with fat layer exposed: Secondary | ICD-10-CM | POA: Diagnosis not present

## 2019-04-28 DIAGNOSIS — Z87891 Personal history of nicotine dependence: Secondary | ICD-10-CM | POA: Diagnosis not present

## 2019-04-28 DIAGNOSIS — M25461 Effusion, right knee: Secondary | ICD-10-CM

## 2019-05-05 DIAGNOSIS — I739 Peripheral vascular disease, unspecified: Secondary | ICD-10-CM | POA: Diagnosis not present

## 2019-05-05 DIAGNOSIS — B351 Tinea unguium: Secondary | ICD-10-CM | POA: Diagnosis not present

## 2019-05-05 DIAGNOSIS — D37030 Neoplasm of uncertain behavior of the parotid salivary glands: Secondary | ICD-10-CM | POA: Diagnosis not present

## 2019-05-07 ENCOUNTER — Other Ambulatory Visit: Payer: Self-pay

## 2019-05-07 NOTE — Patient Outreach (Signed)
[facility]) Care Management  05/07/2019  [name] 09-Dec-1945 [id number]   Successful follow up call to patient's daughter, [name].  Daughter confirmed receipt of paperwork from [place] and [facility]; paperwork has been completed and sent back.  Reviewed HC POA and Living Will documentation.  Daughter verbalized understanding of how to complete documentation and to ensure they are uploaded to EHR once completed.  Daughter requested another follow up call next week in case she has other questions.  Ronn Melena, BSW Social Worker [phone]

## 2019-05-12 ENCOUNTER — Encounter (HOSPITAL_BASED_OUTPATIENT_CLINIC_OR_DEPARTMENT_OTHER): Payer: Medicare Other | Attending: Physician Assistant

## 2019-05-12 ENCOUNTER — Ambulatory Visit: Payer: Medicare Other

## 2019-05-12 ENCOUNTER — Other Ambulatory Visit: Payer: Self-pay

## 2019-05-12 NOTE — Patient Outreach (Signed)
[facility]) Care Management  05/12/2019  [name] Mar 08, 1946 [id number]   During last outreach, daughter asked for follow up call this week in case she has additional questions regarding Advance Directive documentation or referrals to [facility] and Independent Living for ramp. Successful outreach to daughter today, however, she denied need for additional help at this time.  Will follow up next month regarding referrals for ramp.  Ronn Melena, BSW Social Worker [phone]

## 2019-05-20 ENCOUNTER — Ambulatory Visit
Admission: RE | Admit: 2019-05-20 | Discharge: 2019-05-20 | Disposition: A | Discharge status: Home / Self Care | Payer: Medicare Other | Source: Ambulatory Visit | Attending: Physician Assistant | Admitting: Physician Assistant

## 2019-05-20 DIAGNOSIS — M7989 Other specified soft tissue disorders: Secondary | ICD-10-CM | POA: Diagnosis not present

## 2019-05-20 DIAGNOSIS — M25561 Pain in right knee: Secondary | ICD-10-CM | POA: Diagnosis not present

## 2019-05-20 DIAGNOSIS — M25461 Effusion, right knee: Secondary | ICD-10-CM

## 2019-05-23 DIAGNOSIS — S52502D Unspecified fracture of the lower end of left radius, subsequent encounter for closed fracture with routine healing: Secondary | ICD-10-CM | POA: Diagnosis not present

## 2019-05-23 DIAGNOSIS — M1380 Other specified arthritis, unspecified site: Secondary | ICD-10-CM | POA: Diagnosis not present

## 2019-05-23 DIAGNOSIS — M2550 Pain in unspecified joint: Secondary | ICD-10-CM | POA: Diagnosis not present

## 2019-05-23 DIAGNOSIS — I872 Venous insufficiency (chronic) (peripheral): Secondary | ICD-10-CM | POA: Diagnosis not present

## 2019-06-03 ENCOUNTER — Other Ambulatory Visit: Payer: Self-pay | Admitting: Otolaryngology

## 2019-06-17 ENCOUNTER — Other Ambulatory Visit: Payer: Self-pay

## 2019-06-21 ENCOUNTER — Other Ambulatory Visit: Payer: Self-pay

## 2019-06-21 ENCOUNTER — Ambulatory Visit: Payer: Self-pay

## 2019-06-21 NOTE — Patient Outreach (Signed)
[facility]) Care Management  06/21/2019  [name] July 31, 1946 [id number]   Message sent to [name] with Independent [facility] regarding status of referral for ramp.  Ronn Melena, BSW Social Worker [phone]

## 2019-06-22 DIAGNOSIS — S52502D Unspecified fracture of the lower end of left radius, subsequent encounter for closed fracture with routine healing: Secondary | ICD-10-CM | POA: Diagnosis not present

## 2019-06-22 DIAGNOSIS — M2550 Pain in unspecified joint: Secondary | ICD-10-CM | POA: Diagnosis not present

## 2019-06-22 DIAGNOSIS — M1380 Other specified arthritis, unspecified site: Secondary | ICD-10-CM | POA: Diagnosis not present

## 2019-06-22 DIAGNOSIS — I872 Venous insufficiency (chronic) (peripheral): Secondary | ICD-10-CM | POA: Diagnosis not present

## 2019-06-28 ENCOUNTER — Encounter (HOSPITAL_BASED_OUTPATIENT_CLINIC_OR_DEPARTMENT_OTHER): Payer: Self-pay | Admitting: *Deleted

## 2019-06-28 ENCOUNTER — Other Ambulatory Visit: Payer: Self-pay

## 2019-06-29 ENCOUNTER — Encounter (HOSPITAL_BASED_OUTPATIENT_CLINIC_OR_DEPARTMENT_OTHER)
Admission: RE | Admit: 2019-06-29 | Discharge: 2019-06-29 | Disposition: A | Discharge status: Home / Self Care | Payer: Medicare Other | Source: Ambulatory Visit | Attending: Otolaryngology | Admitting: Otolaryngology

## 2019-06-29 ENCOUNTER — Other Ambulatory Visit (HOSPITAL_COMMUNITY)
Admission: RE | Admit: 2019-06-29 | Discharge: 2019-06-29 | Disposition: A | Discharge status: Home / Self Care | Payer: Medicare Other | Source: Ambulatory Visit | Attending: Otolaryngology | Admitting: Otolaryngology

## 2019-06-29 DIAGNOSIS — Z01818 Encounter for other preprocedural examination: Secondary | ICD-10-CM | POA: Insufficient documentation

## 2019-06-29 DIAGNOSIS — Z20828 Contact with and (suspected) exposure to other viral communicable diseases: Secondary | ICD-10-CM | POA: Diagnosis not present

## 2019-06-29 DIAGNOSIS — K118 Other diseases of salivary glands: Secondary | ICD-10-CM | POA: Insufficient documentation

## 2019-06-29 DIAGNOSIS — D110 Benign neoplasm of parotid gland: Secondary | ICD-10-CM | POA: Diagnosis not present

## 2019-06-29 LAB — BASIC METABOLIC PANEL
Anion gap: 10 (ref 5–15)
BUN: 16 mg/dL (ref 8–23)
CO2: 24 mmol/L (ref 22–32)
Calcium: 8.8 mg/dL — ABNORMAL LOW (ref 8.9–10.3)
Chloride: 107 mmol/L (ref 98–111)
Creatinine, Ser: 0.83 mg/dL (ref 0.44–1.00)
GFR calc Af Amer: >60 mL/min (ref >60)
GFR calc non Af Amer: >60 mL/min (ref >60)
Glucose, Bld: 108 mg/dL — ABNORMAL HIGH (ref 70–99)
Potassium: 3.7 mmol/L (ref 3.5–5.1)
Sodium: 141 mmol/L (ref 135–145)

## 2019-06-30 LAB — NOVEL CORONAVIRUS, NAA (HOSP ORDER, SEND-OUT TO REF LAB; TAT 18-24 HRS): SARS-CoV-2, NAA: NOT DETECTED

## 2019-06-30 NOTE — Progress Notes (Signed)
EKG reviewed by Dr. Carignan, will proceed with surgery as scheduled. 

## 2019-07-01 NOTE — Anesthesia Preprocedure Evaluation (Addendum)
Anesthesia Evaluation  Patient identified by MRN, date of birth, ID band Patient awake    Reviewed: Allergy & Precautions, NPO status , Patient's Chart, lab work & pertinent test results  Airway Mallampati: II  TM Distance: >3 FB Neck ROM: Full    Dental no notable dental hx. (+) Dental Advisory Given, Partial Lower, Partial Upper   Pulmonary former smoker,    Pulmonary exam normal breath sounds clear to auscultation       Cardiovascular hypertension, Pt. on medications Normal cardiovascular exam Rhythm:Regular Rate:Normal     Neuro/Psych PSYCHIATRIC DISORDERS Dementia negative neurological ROS     GI/Hepatic negative GI ROS, Neg liver ROS,   Endo/Other  diabetes  Renal/GU negative Renal ROSK  3.7 Cr 0.83     Musculoskeletal negative musculoskeletal ROS (+)   Abdominal   Peds  Hematology negative hematology ROS (+)   Anesthesia Other Findings   Reproductive/Obstetrics negative OB ROS                            Anesthesia Physical Anesthesia Plan  ASA: III  Anesthesia Plan: General   Post-op Pain Management:    Induction: Intravenous  PONV Risk Score and Plan: 4 or greater and Treatment may vary due to age or medical condition, Dexamethasone, Ondansetron and Midazolam  Airway Management Planned: Oral ETT  Additional Equipment: None  Intra-op Plan:   Post-operative Plan: Extubation in OR  Informed Consent: I have reviewed the patients History and Physical, chart, labs and discussed the procedure including the risks, benefits and alternatives for the proposed anesthesia with the patient or authorized representative who has indicated his/her understanding and acceptance.     Dental advisory given  Plan Discussed with: CRNA  Anesthesia Plan Comments:        Anesthesia Quick Evaluation

## 2019-07-02 ENCOUNTER — Other Ambulatory Visit: Payer: Self-pay

## 2019-07-02 ENCOUNTER — Encounter (HOSPITAL_BASED_OUTPATIENT_CLINIC_OR_DEPARTMENT_OTHER): Payer: Self-pay | Admitting: Emergency Medicine

## 2019-07-02 ENCOUNTER — Ambulatory Visit: Payer: Medicare Other | Admitting: Family Medicine

## 2019-07-02 ENCOUNTER — Encounter (HOSPITAL_BASED_OUTPATIENT_CLINIC_OR_DEPARTMENT_OTHER)
Admission: RE | Disposition: A | Discharge status: Home / Self Care | Payer: Self-pay | Source: Home / Self Care | Attending: Otolaryngology

## 2019-07-02 ENCOUNTER — Ambulatory Visit (HOSPITAL_BASED_OUTPATIENT_CLINIC_OR_DEPARTMENT_OTHER): Payer: Medicare Other | Admitting: Anesthesiology

## 2019-07-02 ENCOUNTER — Ambulatory Visit (HOSPITAL_BASED_OUTPATIENT_CLINIC_OR_DEPARTMENT_OTHER)
Admission: RE | Admit: 2019-07-02 | Discharge: 2019-07-03 | Disposition: A | Discharge status: Home / Self Care | Payer: Medicare Other | Attending: Otolaryngology | Admitting: Otolaryngology

## 2019-07-02 DIAGNOSIS — D110 Benign neoplasm of parotid gland: Secondary | ICD-10-CM | POA: Insufficient documentation

## 2019-07-02 DIAGNOSIS — K118 Other diseases of salivary glands: Secondary | ICD-10-CM | POA: Diagnosis not present

## 2019-07-02 DIAGNOSIS — E119 Type 2 diabetes mellitus without complications: Secondary | ICD-10-CM | POA: Insufficient documentation

## 2019-07-02 DIAGNOSIS — Z87891 Personal history of nicotine dependence: Secondary | ICD-10-CM | POA: Diagnosis not present

## 2019-07-02 DIAGNOSIS — I10 Essential (primary) hypertension: Secondary | ICD-10-CM | POA: Diagnosis not present

## 2019-07-02 DIAGNOSIS — Z9049 Acquired absence of other specified parts of digestive tract: Secondary | ICD-10-CM

## 2019-07-02 DIAGNOSIS — F0390 Unspecified dementia without behavioral disturbance: Secondary | ICD-10-CM | POA: Insufficient documentation

## 2019-07-02 DIAGNOSIS — R220 Localized swelling, mass and lump, head: Secondary | ICD-10-CM | POA: Diagnosis present

## 2019-07-02 DIAGNOSIS — D37030 Neoplasm of uncertain behavior of the parotid salivary glands: Secondary | ICD-10-CM | POA: Diagnosis not present

## 2019-07-02 DIAGNOSIS — D509 Iron deficiency anemia, unspecified: Secondary | ICD-10-CM | POA: Diagnosis not present

## 2019-07-02 HISTORY — PX: PAROTIDECTOMY: SHX2163

## 2019-07-02 SURGERY — EXCISION, PAROTID GLAND
Anesthesia: General | Site: Neck | Laterality: Right

## 2019-07-02 MED ORDER — SCOPOLAMINE 1 MG/3DAYS TD PT72: 1.0000 | MEDICATED_PATCH | Freq: Once | TRANSDERMAL | Status: DC

## 2019-07-02 MED ORDER — EPHEDRINE 5 MG/ML INJ
INTRAVENOUS | Status: AC
  Filled 2019-07-02: qty 10

## 2019-07-02 MED ORDER — ONDANSETRON HCL 4 MG/2ML IJ SOLN
INTRAMUSCULAR | Status: AC
  Filled 2019-07-02: qty 2

## 2019-07-02 MED ORDER — OXYCODONE-ACETAMINOPHEN 5-325 MG PO TABS: 1.0000 | ORAL_TABLET | ORAL | 0 refills | Status: AC | PRN

## 2019-07-02 MED ORDER — FENTANYL CITRATE (PF) 100 MCG/2ML IJ SOLN
INTRAMUSCULAR | Status: DC | PRN
  Administered 2019-07-02 (×3): 50 ug via INTRAVENOUS

## 2019-07-02 MED ORDER — MIDAZOLAM HCL 2 MG/2ML IJ SOLN: 1.0000 mg | INTRAMUSCULAR | Status: DC | PRN

## 2019-07-02 MED ORDER — GABAPENTIN 300 MG PO CAPS
ORAL_CAPSULE | ORAL | Status: AC
  Filled 2019-07-02: qty 1

## 2019-07-02 MED ORDER — ROCURONIUM BROMIDE 10 MG/ML (PF) SYRINGE
PREFILLED_SYRINGE | INTRAVENOUS | Status: AC
  Filled 2019-07-02: qty 10

## 2019-07-02 MED ORDER — CLINDAMYCIN HCL 300 MG PO CAPS: 300.0000 mg | ORAL_CAPSULE | Freq: Three times a day (TID) | ORAL | 0 refills | Status: AC

## 2019-07-02 MED ORDER — FENTANYL CITRATE (PF) 100 MCG/2ML IJ SOLN: 25.0000 ug | INTRAMUSCULAR | Status: DC | PRN

## 2019-07-02 MED ORDER — PROPOFOL 10 MG/ML IV BOLUS
INTRAVENOUS | Status: AC
  Filled 2019-07-02: qty 20

## 2019-07-02 MED ORDER — LIDOCAINE 2% (20 MG/ML) 5 ML SYRINGE
INTRAMUSCULAR | Status: AC
  Filled 2019-07-02: qty 5

## 2019-07-02 MED ORDER — FENTANYL CITRATE (PF) 100 MCG/2ML IJ SOLN
INTRAMUSCULAR | Status: AC
  Filled 2019-07-02: qty 2

## 2019-07-02 MED ORDER — BACITRACIN ZINC 500 UNIT/GM EX OINT
TOPICAL_OINTMENT | CUTANEOUS | Status: AC
  Filled 2019-07-02: qty 28.35

## 2019-07-02 MED ORDER — ONDANSETRON HCL 4 MG/2ML IJ SOLN
INTRAMUSCULAR | Status: DC | PRN
  Administered 2019-07-02: 4 mg via INTRAVENOUS

## 2019-07-02 MED ORDER — PHENYLEPHRINE 40 MCG/ML (10ML) SYRINGE FOR IV PUSH (FOR BLOOD PRESSURE SUPPORT)
PREFILLED_SYRINGE | INTRAVENOUS | Status: AC
  Filled 2019-07-02: qty 20

## 2019-07-02 MED ORDER — ZOLPIDEM TARTRATE 5 MG PO TABS: 5.0000 mg | ORAL_TABLET | Freq: Every evening | ORAL | Status: DC | PRN

## 2019-07-02 MED ORDER — PHENYLEPHRINE 40 MCG/ML (10ML) SYRINGE FOR IV PUSH (FOR BLOOD PRESSURE SUPPORT)
PREFILLED_SYRINGE | INTRAVENOUS | Status: AC
  Filled 2019-07-02: qty 10

## 2019-07-02 MED ORDER — LIDOCAINE HCL (CARDIAC) PF 100 MG/5ML IV SOSY
PREFILLED_SYRINGE | INTRAVENOUS | Status: DC | PRN
  Administered 2019-07-02: 80 mg via INTRAVENOUS

## 2019-07-02 MED ORDER — SUCCINYLCHOLINE CHLORIDE 200 MG/10ML IV SOSY
PREFILLED_SYRINGE | INTRAVENOUS | Status: DC | PRN
  Administered 2019-07-02: 100 mg via INTRAVENOUS

## 2019-07-02 MED ORDER — LIDOCAINE-EPINEPHRINE 1 %-1:100000 IJ SOLN
INTRAMUSCULAR | Status: DC | PRN
  Administered 2019-07-02: 6 mL

## 2019-07-02 MED ORDER — MIDAZOLAM HCL 2 MG/2ML IJ SOLN
INTRAMUSCULAR | Status: DC | PRN
  Administered 2019-07-02: 2 mg via INTRAVENOUS

## 2019-07-02 MED ORDER — LIDOCAINE-EPINEPHRINE 1 %-1:100000 IJ SOLN
INTRAMUSCULAR | Status: AC
  Filled 2019-07-02: qty 1

## 2019-07-02 MED ORDER — FENTANYL CITRATE (PF) 100 MCG/2ML IJ SOLN: 50.0000 ug | INTRAMUSCULAR | Status: DC | PRN

## 2019-07-02 MED ORDER — ACETAMINOPHEN 500 MG PO TABS: 1000.0000 mg | ORAL_TABLET | Freq: Once | ORAL | Status: DC

## 2019-07-02 MED ORDER — GABAPENTIN 300 MG PO CAPS
300.0000 mg | ORAL_CAPSULE | Freq: Three times a day (TID) | ORAL | Status: DC
  Administered 2019-07-02 (×2): 300 mg via ORAL
  Filled 2019-07-02: qty 1

## 2019-07-02 MED ORDER — EPHEDRINE SULFATE-NACL 50-0.9 MG/10ML-% IV SOSY
PREFILLED_SYRINGE | INTRAVENOUS | Status: DC | PRN
  Administered 2019-07-02 (×3): 5 mg via INTRAVENOUS
  Administered 2019-07-02 (×2): 7.5 mg via INTRAVENOUS

## 2019-07-02 MED ORDER — POTASSIUM CHLORIDE CRYS ER 10 MEQ PO TBCR: 10.0000 meq | EXTENDED_RELEASE_TABLET | Freq: Every day | ORAL | Status: DC

## 2019-07-02 MED ORDER — ACETAMINOPHEN 10 MG/ML IV SOLN: 1000.0000 mg | Freq: Once | INTRAVENOUS | Status: DC | PRN

## 2019-07-02 MED ORDER — ONDANSETRON HCL 4 MG/2ML IJ SOLN: 4.0000 mg | Freq: Once | INTRAMUSCULAR | Status: DC | PRN

## 2019-07-02 MED ORDER — LACTATED RINGERS IV SOLN
INTRAVENOUS | Status: DC
  Administered 2019-07-02 (×2): via INTRAVENOUS

## 2019-07-02 MED ORDER — DEXAMETHASONE SODIUM PHOSPHATE 10 MG/ML IJ SOLN
INTRAMUSCULAR | Status: AC
  Filled 2019-07-02: qty 1

## 2019-07-02 MED ORDER — KCL IN DEXTROSE-NACL 20-5-0.45 MEQ/L-%-% IV SOLN
INTRAVENOUS | Status: DC
  Administered 2019-07-02: 15:00:00 via INTRAVENOUS
  Filled 2019-07-02: qty 1000

## 2019-07-02 MED ORDER — DEXAMETHASONE SODIUM PHOSPHATE 10 MG/ML IJ SOLN
INTRAMUSCULAR | Status: DC | PRN
  Administered 2019-07-02: 6 mg via INTRAVENOUS

## 2019-07-02 MED ORDER — PROPOFOL 10 MG/ML IV BOLUS
INTRAVENOUS | Status: DC | PRN
  Administered 2019-07-02: 30 mg via INTRAVENOUS
  Administered 2019-07-02: 150 mg via INTRAVENOUS

## 2019-07-02 MED ORDER — FUROSEMIDE 40 MG PO TABS: 40.0000 mg | ORAL_TABLET | Freq: Every day | ORAL | Status: DC

## 2019-07-02 MED ORDER — OXYCODONE HCL 5 MG PO TABS
5.0000 mg | ORAL_TABLET | ORAL | Status: DC | PRN
  Administered 2019-07-02 (×2): 5 mg via ORAL
  Filled 2019-07-02 (×2): qty 1

## 2019-07-02 MED ORDER — MIDAZOLAM HCL 2 MG/2ML IJ SOLN
INTRAMUSCULAR | Status: AC
  Filled 2019-07-02: qty 2

## 2019-07-02 MED ORDER — PHENYLEPHRINE 40 MCG/ML (10ML) SYRINGE FOR IV PUSH (FOR BLOOD PRESSURE SUPPORT)
PREFILLED_SYRINGE | INTRAVENOUS | Status: DC | PRN
  Administered 2019-07-02: 120 ug via INTRAVENOUS
  Administered 2019-07-02: 100 ug via INTRAVENOUS
  Administered 2019-07-02: 120 ug via INTRAVENOUS

## 2019-07-02 MED ORDER — QUETIAPINE FUMARATE 25 MG PO TABS
25.0000 mg | ORAL_TABLET | Freq: Every day | ORAL | Status: DC
  Administered 2019-07-02: 25 mg via ORAL

## 2019-07-02 MED ORDER — CLINDAMYCIN PHOSPHATE 600 MG/50ML IV SOLN
INTRAVENOUS | Status: DC | PRN
  Administered 2019-07-02: 600 mg via INTRAVENOUS

## 2019-07-02 MED ORDER — MORPHINE SULFATE (PF) 4 MG/ML IV SOLN: 2.0000 mg | INTRAVENOUS | Status: DC | PRN

## 2019-07-02 SURGICAL SUPPLY — 68 items
ADH SKN CLS APL DERMABOND .7 (GAUZE/BANDAGES/DRESSINGS) ×1
APL SRG 3 HI ABS STRL LF PLS (MISCELLANEOUS) ×1
APPLICATOR DR MATTHEWS STRL (MISCELLANEOUS) ×3 IMPLANT
ATTRACTOMAT 16X20 MAGNETIC DRP (DRAPES) IMPLANT
BALL CTTN LRG ABS STRL LF (GAUZE/BANDAGES/DRESSINGS) ×1
BLADE SURG 15 STRL LF DISP TIS (BLADE) ×1 IMPLANT
BLADE SURG 15 STRL SS (BLADE) ×3
CANISTER SUCT 1200ML W/VALVE (MISCELLANEOUS) ×3 IMPLANT
CORD BIPOLAR FORCEPS 12FT (ELECTRODE) ×3 IMPLANT
COTTONBALL LRG STERILE PKG (GAUZE/BANDAGES/DRESSINGS) ×3 IMPLANT
COVER BACK TABLE REUSABLE LG (DRAPES) ×3 IMPLANT
COVER MAYO STAND REUSABLE (DRAPES) ×3 IMPLANT
COVER WAND RF STERILE (DRAPES) IMPLANT
DECANTER SPIKE VIAL GLASS SM (MISCELLANEOUS) ×3 IMPLANT
DERMABOND ADVANCED (GAUZE/BANDAGES/DRESSINGS) ×2
DERMABOND ADVANCED .7 DNX12 (GAUZE/BANDAGES/DRESSINGS) ×1 IMPLANT
DRAIN CHANNEL 10F 3/8 F FF (DRAIN) ×2 IMPLANT
DRAPE SURG 17X23 STRL (DRAPES) IMPLANT
DRAPE U-SHAPE 76X120 STRL (DRAPES) ×3 IMPLANT
ELECT COATED BLADE 2.86 ST (ELECTRODE) ×3 IMPLANT
ELECT PAIRED SUBDERMAL (MISCELLANEOUS) ×3
ELECT REM PT RETURN 9FT ADLT (ELECTROSURGICAL) ×3
ELECTRODE PAIRED SUBDERMAL (MISCELLANEOUS) ×1 IMPLANT
ELECTRODE REM PT RTRN 9FT ADLT (ELECTROSURGICAL) ×1 IMPLANT
EVACUATOR SILICONE 100CC (DRAIN) ×2 IMPLANT
FORCEPS BIPOLAR SPETZLER 8 1.0 (NEUROSURGERY SUPPLIES) ×3 IMPLANT
GAUZE 4X4 16PLY RFD (DISPOSABLE) ×7 IMPLANT
GLOVE BIO SURGEON STRL SZ 6.5 (GLOVE) IMPLANT
GLOVE BIO SURGEON STRL SZ7.5 (GLOVE) ×1 IMPLANT
GLOVE BIO SURGEONS STRL SZ 6.5 (GLOVE)
GLOVE BIOGEL PI IND STRL 7.0 (GLOVE) IMPLANT
GLOVE BIOGEL PI INDICATOR 7.0 (GLOVE) ×2
GLOVE SURG SS PI 7.0 STRL IVOR (GLOVE) ×6 IMPLANT
GLOVE SURG SYN 7.5  E (GLOVE) ×2
GLOVE SURG SYN 7.5 E (GLOVE) ×1 IMPLANT
GLOVE SURG SYN 7.5 PF PI (GLOVE) IMPLANT
GOWN STRL REUS W/ TWL LRG LVL3 (GOWN DISPOSABLE) ×2 IMPLANT
GOWN STRL REUS W/TWL LRG LVL3 (GOWN DISPOSABLE) ×9
LOCATOR NERVE 3 VOLT (DISPOSABLE) IMPLANT
NDL HYPO 25X1 1.5 SAFETY (NEEDLE) ×1 IMPLANT
NEEDLE HYPO 25X1 1.5 SAFETY (NEEDLE) ×3 IMPLANT
NS IRRIG 1000ML POUR BTL (IV SOLUTION) ×3 IMPLANT
PACK BASIN DAY SURGERY FS (CUSTOM PROCEDURE TRAY) ×3 IMPLANT
PAD ALCOHOL SWAB (MISCELLANEOUS) ×10 IMPLANT
PENCIL BUTTON HOLSTER BLD 10FT (ELECTRODE) ×3 IMPLANT
PIN SAFETY STERILE (MISCELLANEOUS) IMPLANT
PROBE NERVBE PRASS .33 (MISCELLANEOUS) ×3 IMPLANT
SHEARS HARMONIC 9CM CVD (BLADE) ×3 IMPLANT
SLEEVE SCD COMPRESS KNEE MED (MISCELLANEOUS) ×1 IMPLANT
SPONGE INTESTINAL PEANUT (DISPOSABLE) ×7 IMPLANT
STAPLER VISISTAT 35W (STAPLE) IMPLANT
STOCKING KNEE LG LNG (STOCKING) ×2 IMPLANT
SUT ETHILON 3 0 PS 1 (SUTURE) ×2 IMPLANT
SUT PROLENE 5 0 PS 2 (SUTURE) ×3 IMPLANT
SUT SILK 2 0 PERMA HAND 18 BK (SUTURE) ×9 IMPLANT
SUT SILK 2 0 TIES 17X18 (SUTURE)
SUT SILK 2-0 18XBRD TIE BLK (SUTURE) IMPLANT
SUT SILK 3 0 TIES 17X18 (SUTURE) ×3
SUT SILK 3-0 18XBRD TIE BLK (SUTURE) ×1 IMPLANT
SUT SILK 4 0 TIES 17X18 (SUTURE) IMPLANT
SUT VIC AB 3-0 FS2 27 (SUTURE) IMPLANT
SUT VICRYL 4-0 PS2 18IN ABS (SUTURE) ×5 IMPLANT
SYR BULB 3OZ (MISCELLANEOUS) ×3 IMPLANT
SYR CONTROL 10ML LL (SYRINGE) ×3 IMPLANT
TOWEL GREEN STERILE FF (TOWEL DISPOSABLE) ×3 IMPLANT
TRAY DSU PREP LF (CUSTOM PROCEDURE TRAY) ×3 IMPLANT
TUBE CONNECTING 20'X1/4 (TUBING) ×1
TUBE CONNECTING 20X1/4 (TUBING) ×2 IMPLANT

## 2019-07-02 NOTE — Progress Notes (Signed)
Pt received in phase I of PACU, report was given by the OR RN and CRNA. Both of patients lower extremities are swollen and warm to touch. According to the pre-op nurse, the pts daughter stated that her legs have been like this for the last year and half and the pts PCP is aware. I called Dr. Benjamine Mola who was also already aware of the situation to re-ensure that no further evaluation was needed. Because this has been an ongoing issue for the pt he felt no need for any further follow up. Will continue to monitor and report off to [facility] RN.

## 2019-07-02 NOTE — H&P (Signed)
Cc: Right facial mass  HPI: The patient is a 73 y/o female who presents today with her daughter for evaluation of a right facial mass. The patient is seen in consultation requested by [facility]. The patient first noticed the mass over 5 years ago. CT at that time showed a 4.2 cm right parotid mass. According to the patient, since the mass was not causing any difficulty, observation was recommended. The daughter states the mass has significantly increased in size over the past several months. The patient denies pain or erythema. No dysphagia or odynophagia is noted. Previous ENT surgery is denied.   The patient's review of systems (constitutional, eyes, ENT, cardiovascular, respiratory, GI, musculoskeletal, skin, neurologic, psychiatric, endocrine, hematologic, allergic) is noted in the ROS questionnaire.  It is reviewed with the patient and her daughter.   Family health history: COPD,  ovarian cancer.  Major events: Hysterectomy.  Ongoing medical problems: Arthritis, diabetes, hypertension, lower extremity edema.  Social history: The patient is a widow.   She is a former smoker. She denies the use of alcohol or illegal drugs.   Exam: General: Communicates without difficulty, well nourished, no acute distress. Head: Normocephalic, no evidence injury, no tenderness, facial buttresses intact without stepoff. Eyes: PERRL, EOMI. No scleral icterus, conjunctivae clear. Neuro: CN II exam reveals vision grossly intact.  No nystagmus at any point of gaze. Ears: Auricles well formed without lesions.  Ear canals are intact without mass or lesion.  No erythema or edema is appreciated.  The TMs are intact without fluid. Nose: External evaluation reveals normal support and skin without lesions.  Dorsum is intact.  Anterior rhinoscopy reveals healthy pink mucosa over anterior aspect of inferior turbinates and intact septum.  No purulence noted. Oral:  Oral cavity and oropharynx are intact, symmetric, without  erythema or edema.  Mucosa is moist without lesions. Neck: Full range of motion without pain.  There is no significant lymphadenopathy.  No masses palpable.  Thyroid bed within normal limits to palpation.  Submandibular glands equal bilaterally without mass. A 6 cm inferior right parotid mass is noted.  Trachea is midline. Neuro:  CN 2-12 grossly intact. Gait antalgic. Vestibular: No nystagmus at any point of gaze. The cerebellar examination is unremarkable.   Assessment The patient is noted to have a 6 cm inferior right parotid mass, extending from the superior to the deep lobe of the right parotid gland. No facial weakness is noted.   Plan 1. The physical exam and the CT images are reviewed with the patient and her daughter.  2. In light of the large size of the mass, recommend total right parotidectomy. The risks, benefits, alternatives, and details of the procedure are reviewed with the patient and her daughter. This includes potential injury to her facial nerve. Questions are invited and answered. 3. The patient is interested in proceeding with the procedure.  We will schedule the procedure in accordance with the family schedule.

## 2019-07-02 NOTE — Anesthesia Procedure Notes (Signed)
Procedure Name: Intubation Date/Time: 07/02/2019 9:43 AM Performed by: Raenette Rover, CRNA Pre-anesthesia Checklist: Patient identified, Emergency Drugs available, Suction available and Patient being monitored Patient Re-evaluated:Patient Re-evaluated prior to induction Oxygen Delivery Method: Circle system utilized Preoxygenation: Pre-oxygenation with 100% oxygen Induction Type: IV induction Ventilation: Mask ventilation without difficulty Laryngoscope Size: Mac and 3 Grade View: Grade I Tube type: Oral Tube size: 7.0 mm Number of attempts: 1 Airway Equipment and Method: Stylet Placement Confirmation: ETT inserted through vocal cords under direct vision,  positive ETCO2 and breath sounds checked- equal and bilateral Secured at: 21 cm Tube secured with: Tape Dental Injury: Teeth and Oropharynx as per pre-operative assessment

## 2019-07-02 NOTE — Discharge Instructions (Signed)
Parotidectomy, Care After °This sheet gives you information about how to care for yourself after your procedure. Your health care provider may also give you more specific instructions. If you have problems or questions, contact your health care provider. °What can I expect after the procedure? °After the procedure, it is common to have: °· Pain and mild swelling at the incision site. °· Numbness along the incision. °· Numbness in part or all of your ear. °· Mild jaw discomfort on the surgical side when you are eating or chewing. This may last up to 2-4 weeks. °Follow these instructions at home: °Medicines ° °· Take over-the-counter and prescription medicines only as told by your health care provider. °· If you were prescribed an antibiotic medicine, take it as told by your health care provider. Do not stop taking the antibiotic even if you start to feel better. °· Ask your health care provider if the medicine prescribed to you: °? Requires you to avoid driving or using heavy machinery. °? Can cause constipation. You may need to take actions to prevent or treat constipation, such as: °§ Drink enough fluid to keep your urine pale yellow. °§ Take over-the-counter or prescription medicines. °§ Eat foods that are high in fiber, such as beans, whole grains, and fresh fruits and vegetables. °§ Limit foods that are high in fat and processed sugars, such as fried or sweet foods. °Incision care ° °· Follow instructions from your health care provider about how to take care of your incision. Make sure you: °? Leave stitches (sutures), skin glue, or adhesive strips in place. These skin closures may need to be in place for 2 weeks or longer. If adhesive strip edges start to loosen and curl up, you may trim the loose edges. Do not remove adhesive strips completely unless your health care provider tells you to do that. °· Check your incision area every day for signs of infection. Check for: °? More redness, swelling, or  pain. °? Fluid or blood. °? Warmth. °? Pus or a bad smell. °· Follow your health care provider's instructions about cleaning and maintaining the drain that was placed near your incision. °Eating and drinking °· Follow instructions from your health care provider about eating or drinking restrictions. °· If your mouth or jaw is sore, try eating soft foods until you feel better. °Activity °· Return to your normal activities as told by your health care provider. Ask your health care provider what activities are safe for you. °· Rest as told by your health care provider. °· Avoid sitting for a long time without moving. Get up to take short walks every 1-2 hours. This is important to improve blood flow and breathing. Ask for help if you feel weak or unsteady. °· Do not lift anything that is heavier than 10 lb (4.5 kg), or the limit that you are told, until your health care provider says that it is safe. °General instructions °· Keep your head raised (elevated) when you lie down during the first few weeks after surgery. This will help prevent increased swelling. °· Do not take baths, swim, or use a hot tub until your health care provider approves. Ask your health care provider if you may take showers. You may only be allowed to take sponge baths. °· Keep all follow-up visits as told by your health care provider. This is important. °Contact a health care provider if: °· You have pain that does not get better with medicine. °· You have more redness, swelling,   or pain around your incision. °· You have fluid or blood coming from your incision. °· Your incision feels warm to the touch. °· You have pus or a bad smell coming from your incision. °· You vomit or feel nauseous. °· You have a fever. °Get help right away if: °· You have more pain, swelling, or redness that suddenly gets worse at the incision site. °· You have increasing numbness or weakness in your face. °· You have severe pain. °Summary °· After the procedure, it is  common to have mild jaw discomfort on the surgical side when you are eating or chewing. This may last up to 2-4 weeks. °· Follow instructions from your health care provider about how to take care of your incision. °· If your mouth or jaw is sore, try eating soft foods until you feel better. °· Return to your normal activities as told by your health care provider. Ask your health care provider what activities are safe for you. °This information is not intended to replace advice given to you by your health care provider. Make sure you discuss any questions you have with your health care provider. °Document Released: 09/28/2010 Document Revised: 06/15/2018 Document Reviewed: 06/17/2018 °Elsevier Patient Education © 2020 Elsevier Inc[employer] ° °

## 2019-07-02 NOTE — Anesthesia Postprocedure Evaluation (Signed)
Anesthesia Post Note  [name]  Procedure(s) Performed: RIGHT TOTAL PAROTIDECTOMY (Right Neck)     Patient location during evaluation: PACU Anesthesia Type: General Level of consciousness: awake and alert Pain management: pain level controlled Vital Signs Assessment: post-procedure vital signs reviewed and stable Respiratory status: spontaneous breathing, nonlabored ventilation, respiratory function stable and patient connected to nasal cannula oxygen Cardiovascular status: blood pressure returned to baseline and stable Postop Assessment: no apparent nausea or vomiting Anesthetic complications: no    Last Vitals:  Vitals:   07/02/19 1330 07/02/19 1345  BP: 118/71 128/68  Pulse: 99   Resp: 15   Temp:    SpO2: 96%     Last Pain:  Vitals:   07/02/19 1330  TempSrc:   PainSc: [place] A [place]

## 2019-07-02 NOTE — Transfer of Care (Signed)
Immediate Anesthesia Transfer of Care Note  [name]  Procedure(s) Performed: RIGHT TOTAL PAROTIDECTOMY (Right Neck)  Patient Location: PACU  Anesthesia Type:General  Level of Consciousness: awake  Airway & Oxygen Therapy: Patient Spontanous Breathing and Patient connected to face mask oxygen  Post-op Assessment: Report given to RN and Post -op Vital signs reviewed and stable  Post vital signs: Reviewed and stable  Last Vitals:  Vitals Value Taken Time  BP 155/69 07/02/19 1205  Temp    Pulse 101 07/02/19 1211  Resp 14 07/02/19 1211  SpO2 100 % 07/02/19 1211  Vitals shown include unvalidated device data.  Last Pain:  Vitals:   07/02/19 0837  TempSrc: Oral  PainSc: 2          Complications: No apparent anesthesia complications

## 2019-07-02 NOTE — Op Note (Signed)
DATE OF PROCEDURE:  07/02/2019                              OPERATIVE REPORT  SURGEON:  Leta Baptist, MD  PREOPERATIVE DIAGNOSES: 1. Right parotid mass  POSTOPERATIVE DIAGNOSES: 1. Right parotid mass  PROCEDURE PERFORMED:  Right total parotidectomy  ANESTHESIA:  General endotracheal tube anesthesia.  COMPLICATIONS:  None.  ESTIMATED BLOOD LOSS:  Minimal.  INDICATION FOR PROCEDURE:  [name] is a 73 y.o. female with a history of an enlarging right facial mass.  The patient first noticed the right facial mass 5 years ago.  It has gradually increased in size.  Her recent CT scan showed a 4.2 cm right parotid mass.  The large mass extended from the lateral to the deep lobe of the right parotid gland.  Due to the progressive enlargement of the mass, the patient would like to have the tumor removed.  Based on the above findings, the decision was made for the patient to undergo the adenotonsillectomy procedure. Likelihood of success in reducing symptoms was also discussed.  The risks, benefits, alternatives, and details of the procedure were discussed with the patient and her daughter.  Questions were invited and answered.  Informed consent was obtained.  DESCRIPTION:  The patient was taken to the operating room and placed supine on the operating table.  General endotracheal tube anesthesia was administered by the anesthesiologist.  The patient was positioned and prepped and draped in a standard fashion for right parotidectomy surgery.  The facial nerve monitoring electrodes were placed.  The facial nerve monitoring system was functional throughout the case.  1% lidocaine with 1-100,000 epinephrine was infiltrated at the planned site of incision.  A curvilinear facelift incision was made on the right side. The incision was carried down to the level of the SMAS. The SMAS skin flap was elevated in the standard fashion. The large 4.2 cm parotid mass was noted. Dissection was carried out  anterior to the auricular cartilage. The main trunk and the branches of the facial nerve were identified and preserved. The tumor was lateral and deep to the nerve. The facial nerve branches were dissected free from the tumor. The entire tumor was removed and sent to the pathology department. The facial nerve was functional at the end of the case.  A #10 JP drain was placed. The surgical site was copiously irrigated. The incision was closed in layers with 4-0 vicryl, 5-0 prolene, and dermabond.  The care of the patient was turned over to the anesthesiologist.  The patient was awakened from anesthesia without difficulty.  The patient was extubated and transferred to the recovery room in good condition.  OPERATIVE FINDINGS:  A large 4.2cm right parotid mass.  SPECIMEN:  Right parotid mass  FOLLOWUP CARE:  The patient will be admitted for overnight observation.  [name] W Morine Kohlman 07/02/2019 11:52 AM

## 2019-07-03 NOTE — Discharge Summary (Signed)
Physician Discharge Summary  Patient ID: [name] MRN: [id number] DOB/AGE: October 01, 1945 73 y.o.  Admit date: 07/02/2019 Discharge date: 07/03/2019  Admission Diagnoses: Right parotid mass  Discharge Diagnoses: Right parotid mass Active Problems:   H/O parotidectomy   Discharged Condition: good  Hospital Course: Pt had an uneventful overnight stay. Pt tolerated po well. No bleeding. No stridor. Facial nerve intact.  Consults: None  Significant Diagnostic Studies: None  Treatments: surgery: Right parotidectomy  Discharge Exam: Blood pressure 101/65, pulse 73, temperature (!) 97.3 F (36.3 C), resp. rate 16, height 5' 7.01" (1.702 m), weight 84.2 kg, SpO2 98 %. Incision/Wound:c/d/i  Disposition: Discharge disposition: 01-Home or Self Care       Discharge Instructions    Activity as tolerated - No restrictions   Complete by: As directed    Diet general   Complete by: As directed      Allergies as of 07/03/2019      Reactions   Orange Concentrate [flavoring Agent] Shortness Of Breath, Itching, Swelling   Peach [prunus Persica] Shortness Of Breath, Itching, Swelling   Penicillins Anaphylaxis, Hives   All 'cillins' Has patient had a PCN reaction causing immediate rash, facial/tongue/throat swelling, SOB or lightheadedness with hypotension: Yes Has patient had a PCN reaction causing severe rash involving mucus membranes or skin necrosis: No Has patient had a PCN reaction that required hospitalization No Has patient had a PCN reaction occurring within the last 10 years: No If all of the above answers are "NO", then may proceed with Cephalosporin use.   Strawberry Extract Shortness Of Breath, Itching, Swelling   Tylenol [acetaminophen] Itching   Per patient makes her itch   Adhesive [tape] Itching, Rash   Please use "paper" tape   Aspirin Itching   Latex Itching, Rash   Other Hives, Itching, Rash   NO "-CILLINS"   Pineapple Itching, Rash   Strawberry  Flavor Itching, Rash      Medication List    TAKE these medications   acetaminophen 325 MG tablet Commonly known as: TYLENOL Take 2 tablets (650 mg total) by mouth every 6 (six) hours as needed for mild pain (or Fever >/= 101).   cetirizine 10 MG tablet Commonly known as: ZYRTEC Take 1 tablet (10 mg total) by mouth daily.   clindamycin 300 MG capsule Commonly known as: CLEOCIN Take 1 capsule (300 mg total) by mouth 3 (three) times daily for 3 days.   clobetasol cream 0.05 % Commonly known as: TEMOVATE Apply topically.   doxycycline 100 MG capsule Commonly known as: VIBRAMYCIN Take 1 capsule (100 mg total) by mouth 2 (two) times daily. One po bid x 7 days   furosemide 40 MG tablet Commonly known as: LASIX Take 1 tablet (40 mg total) by mouth daily.   gabapentin 300 MG capsule Commonly known as: NEURONTIN Take 1 capsule (300 mg total) by mouth 3 (three) times daily.   oxyCODONE-acetaminophen 5-325 MG tablet Commonly known as: Percocet Take 1 tablet by mouth every 4 (four) hours as needed for up to 4 days for severe pain.   potassium chloride 10 MEQ tablet Commonly known as: KLOR-CON Take 1 tablet (10 mEq total) by mouth daily.   QUEtiapine 50 MG tablet Commonly known as: SEROquel Take 0.5-1 tablets (25-50 mg total) by mouth at bedtime.   silver sulfADIAZINE 1 % cream Commonly known as: SILVADENE Apply topically.   traMADol 50 MG tablet Commonly known as: ULTRAM Take 1 tablet (50 mg total) by mouth every 6 (six)  hours as needed for moderate pain.   triamcinolone cream 0.1 % Commonly known as: KENALOG Apply 1 application topically 2 (two) times daily.      Follow-up Information    Leta Baptist, MD On 07/09/2019.   Specialty: Otolaryngology Why: at [employer] information: [address] [place] [address] [phone]           Signed: Burley Saver 07/03/2019, 7:54 AM

## 2019-07-05 ENCOUNTER — Encounter (HOSPITAL_BASED_OUTPATIENT_CLINIC_OR_DEPARTMENT_OTHER): Payer: Self-pay | Admitting: Otolaryngology

## 2019-07-05 LAB — SURGICAL PATHOLOGY

## 2019-07-12 ENCOUNTER — Other Ambulatory Visit: Payer: Self-pay

## 2019-07-12 NOTE — Patient Outreach (Signed)
[place] [facility]) Care Management  07/12/2019  [name] 08/17/1946 [id number]   [place] with [facility] regarding status of referral for ramp.  Ronn Melena, BSW Social Worker [phone]

## 2019-07-23 DIAGNOSIS — M1380 Other specified arthritis, unspecified site: Secondary | ICD-10-CM | POA: Diagnosis not present

## 2019-07-23 DIAGNOSIS — S52502D Unspecified fracture of the lower end of left radius, subsequent encounter for closed fracture with routine healing: Secondary | ICD-10-CM | POA: Diagnosis not present

## 2019-07-23 DIAGNOSIS — M2550 Pain in unspecified joint: Secondary | ICD-10-CM | POA: Diagnosis not present

## 2019-07-23 DIAGNOSIS — I872 Venous insufficiency (chronic) (peripheral): Secondary | ICD-10-CM | POA: Diagnosis not present

## 2019-08-11 ENCOUNTER — Other Ambulatory Visit: Payer: Self-pay

## 2019-08-11 NOTE — Patient Outreach (Signed)
[place] [facility]) Care Management  08/11/2019  [name] 11/18/1945 [id number]   Communicated with Bill Salinas from [facility] regarding status of referral for ramp.  Per Caren Griffins, "we are still waiting on medical documentation from  doctor, and I will refax consents on my next office visit on Friday" Will follow up again within the next two months.  Ronn Melena, BSW Social Worker [phone]

## 2019-08-22 DIAGNOSIS — M2550 Pain in unspecified joint: Secondary | ICD-10-CM | POA: Diagnosis not present

## 2019-08-22 DIAGNOSIS — M1380 Other specified arthritis, unspecified site: Secondary | ICD-10-CM | POA: Diagnosis not present

## 2019-08-22 DIAGNOSIS — I872 Venous insufficiency (chronic) (peripheral): Secondary | ICD-10-CM | POA: Diagnosis not present

## 2019-08-22 DIAGNOSIS — S52502D Unspecified fracture of the lower end of left radius, subsequent encounter for closed fracture with routine healing: Secondary | ICD-10-CM | POA: Diagnosis not present

## 2019-09-21 DIAGNOSIS — I10 Essential (primary) hypertension: Secondary | ICD-10-CM | POA: Diagnosis not present

## 2019-09-21 DIAGNOSIS — E119 Type 2 diabetes mellitus without complications: Secondary | ICD-10-CM | POA: Diagnosis not present

## 2019-09-22 DIAGNOSIS — M2550 Pain in unspecified joint: Secondary | ICD-10-CM | POA: Diagnosis not present

## 2019-09-22 DIAGNOSIS — I872 Venous insufficiency (chronic) (peripheral): Secondary | ICD-10-CM | POA: Diagnosis not present

## 2019-09-22 DIAGNOSIS — M1380 Other specified arthritis, unspecified site: Secondary | ICD-10-CM | POA: Diagnosis not present

## 2019-09-22 DIAGNOSIS — S52502D Unspecified fracture of the lower end of left radius, subsequent encounter for closed fracture with routine healing: Secondary | ICD-10-CM | POA: Diagnosis not present

## 2019-10-12 ENCOUNTER — Other Ambulatory Visit: Payer: Self-pay

## 2019-10-12 NOTE — Patient Outreach (Signed)
[place] [facility]) Care Management  10/12/2019  [place] 1945/09/30 [id number]   Messaged Tracey McMasters from [facility] regarding status of referral for ramp.  Awaiting response.  Ronn Melena, BSW Social Worker [phone]

## 2019-10-18 ENCOUNTER — Ambulatory Visit (INDEPENDENT_AMBULATORY_CARE_PROVIDER_SITE_OTHER): Payer: Medicare Other | Admitting: Family Medicine

## 2019-10-18 ENCOUNTER — Other Ambulatory Visit: Payer: Self-pay

## 2019-10-18 VITALS — BP 137/72 | Ht 67.5 in | Wt 185.0 lb

## 2019-10-18 DIAGNOSIS — Z0000 Encounter for general adult medical examination without abnormal findings: Secondary | ICD-10-CM | POA: Diagnosis not present

## 2019-10-18 NOTE — Patient Instructions (Addendum)
Thank you for taking time to come for your Medicare Wellness Visit. I appreciate your ongoing commitment to your health goals. Please review the following plan we discussed and let me know if I can assist you in the future.  Clayden Withem LPN  Preventive Care 76 Years and Older, Female Preventive care refers to lifestyle choices and visits with your health care provider that can promote health and wellness. This includes:  A yearly physical exam. This is also called an annual well check.  Regular dental and eye exams.  Immunizations.  Screening for certain conditions.  Healthy lifestyle choices, such as diet and exercise. What can I expect for my preventive care visit? Physical exam Your health care provider will check:  Height and weight. These may be used to calculate body mass index (BMI), which is a measurement that tells if you are at a healthy weight.  Heart rate and blood pressure.  Your skin for abnormal spots. Counseling Your health care provider may ask you questions about:  Alcohol, tobacco, and drug use.  Emotional well-being.  Home and relationship well-being.  Sexual activity.  Eating habits.  History of falls.  Memory and ability to understand (cognition).  Work and work environment.  Pregnancy and menstrual history. What immunizations do I need?  Influenza (flu) vaccine  This is recommended every year. Tetanus, diphtheria, and pertussis (Tdap) vaccine  You may need a Td booster every 10 years. Varicella (chickenpox) vaccine  You may need this vaccine if you have not already been vaccinated. Zoster (shingles) vaccine  You may need this after age 76. Pneumococcal conjugate (PCV13) vaccine  One dose is recommended after age 76. Pneumococcal polysaccharide (PPSV23) vaccine  One dose is recommended after age 76. Measles, mumps, and rubella (MMR) vaccine  You may need at least one dose of MMR if you were born in 1957 or later. You may also  need a second dose. Meningococcal conjugate (MenACWY) vaccine  You may need this if you have certain conditions. Hepatitis A vaccine  You may need this if you have certain conditions or if you travel or work in places where you may be exposed to hepatitis A. Hepatitis B vaccine  You may need this if you have certain conditions or if you travel or work in places where you may be exposed to hepatitis B. Haemophilus influenzae type b (Hib) vaccine  You may need this if you have certain conditions. You may receive vaccines as individual doses or as more than one vaccine together in one shot (combination vaccines). Talk with your health care provider about the risks and benefits of combination vaccines. What tests do I need? Blood tests  Lipid and cholesterol levels. These may be checked every 5 years, or more frequently depending on your overall health.  Hepatitis C test.  Hepatitis B test. Screening  Lung cancer screening. You may have this screening every year starting at age 76 if you have a 30-pack-year history of smoking and currently smoke or have quit within the past 15 years.  Colorectal cancer screening. All adults should have this screening starting at age 50 and continuing until age 76. Your health care provider may recommend screening at age 45 if you are at increased risk. You will have tests every 1-10 years, depending on your results and the type of screening test.  Diabetes screening. This is done by checking your blood sugar (glucose) after you have not eaten for a while (fasting). You may have this done every 1-3   years.  Mammogram. This may be done every 1-2 years. Talk with your health care provider about how often you should have regular mammograms.  BRCA-related cancer screening. This may be done if you have a family history of breast, ovarian, tubal, or peritoneal cancers. Other tests  Sexually transmitted disease (STD) testing.  Bone density scan. This is done  to screen for osteoporosis. You may have this done starting at age 94. Follow these instructions at home: Eating and drinking  Eat a diet that includes fresh fruits and vegetables, whole grains, lean protein, and low-fat dairy products. Limit your intake of foods with high amounts of sugar, saturated fats, and salt.  Take vitamin and mineral supplements as recommended by your health care provider.  Do not drink alcohol if your health care provider tells you not to drink.  If you drink alcohol: ? Limit how much you have to 0-1 drink a day. ? Be aware of how much alcohol is in your drink. In the [place] one drink equals one 12 oz bottle of beer (355 mL), one 5 oz glass of wine (148 mL), or one 1 oz glass of hard liquor (44 mL). Lifestyle  Take daily care of your teeth and gums.  Stay active. Exercise for at least 30 minutes on 5 or more days each week.  Do not use any products that contain nicotine or tobacco, such as cigarettes, e-cigarettes, and chewing tobacco. If you need help quitting, ask your health care provider.  If you are sexually active, practice safe sex. Use a condom or other form of protection in order to prevent STIs (sexually transmitted infections).  Talk with your health care provider about taking a low-dose aspirin or statin. What's next?  Go to your health care provider once a year for a well check visit.  Ask your health care provider how often you should have your eyes and teeth checked.  Stay up to date on all vaccines. This information is not intended to replace advice given to you by your health care provider. Make sure you discuss any questions you have with your health care provider. Document Revised: 08/20/2018 Document Reviewed: 08/20/2018 Elsevier Patient Education  2020 [employer].

## 2019-10-18 NOTE — Progress Notes (Signed)
Presents today for [employer] Visit   Date of last exam: 04/07/2019  Interpreter used for this visit?  No  I connected with  Laurita Quint on 10/18/19 by a telephone application and verified that I am speaking with the correct person using two identifiers.   I discussed the limitations of evaluation and management by telemedicine. The patient expressed understanding and agreed to proceed.    Patient Care Team: Forrest Moron, MD as PCP - General (Internal Medicine) Chrismon, Amber as [facility]   Other items to address today:  Discussed memory loss with daughter Follow up scheduled with Dr. Kaleen Mask 2-16 @ 10:00 am Discussed immunizations Discussed Eye/Dental   Other Screening: Last screening for diabetes: 01/01/2019 Last lipid screening:01/01/2019  ADVANCE DIRECTIVES: Discussed:yes On File: no Materials Provided: yes  Immunization status:  Immunization History  Administered Date(s) Administered  . PPD Test 04/03/2017     Health Maintenance Due  Topic Date Due  . FOOT EXAM  11/22/1955  . OPHTHALMOLOGY EXAM  11/22/1953  . URINE MICROALBUMIN  11/22/1953  . TETANUS/TDAP  11/23/1962  . COLONOSCOPY  11/22/1993  . DEXA SCAN  11/22/2008  . PNA vac Low Risk Adult (1 of 2 - PCV13) 11/22/2008  . INFLUENZA VACCINE  04/10/2019  . HEMOGLOBIN A1C  07/03/2019     Functional Status Survey: Is the patient deaf or have difficulty hearing?: No Does the patient have difficulty seeing, even when wearing glasses/contacts?: No Does the patient have difficulty concentrating, remembering, or making decisions?: Yes(dementia) Does the patient have difficulty walking or climbing stairs?: Yes Does the patient have difficulty dressing or bathing?: Yes Does the patient have difficulty doing errands alone such as visiting a doctor's office or shopping?: Yes   6CIT Screen 10/18/2019  What Year? 4 points  What month? 3  points  What time? 0 points  Count back from 20 2 points  Months in reverse 2 points  Repeat phrase 6 points  Total Score 17        Clinical Support from 10/18/2019 in [facility]  AUDIT-C Score  0       Home Environment:   Lives in one story home with family Family does all care for patient Yes scattered rugs No grab bars Yes trouble climbing stairs Adequate lighting / no clutter   Timed Warm up N/A   Patient Active Problem List   Diagnosis Date Noted  . H/O parotidectomy 07/02/2019  . Dementia with behavioral problem ([place]) 03/09/2018  . Edema 02/16/2018  . Physical deconditioning 04/04/2017  . Eczema 03/24/2017  . Decreased independence with activities of daily living 03/24/2017  . Toenail deformity 03/24/2017  . Decreased mobility and endurance 02/07/2017  . Cellulitis   . [place] scabies 12/18/2016  . Community acquired pneumonia of right lower lobe of lung   . Dehydration   . Failure to thrive in adult   . Impetigo   . Rash of entire body 12/06/2016  . Localized swelling, mass, or lump of lower extremity, bilateral 12/06/2016  . Constipation 03/24/2016  . Fall 02/04/2016  . Chronic anemia 02/04/2016  . Cognitive decline 02/04/2016  . Cellulitis of right arm 01/19/2016  . Swelling of joint, knee, right 10/19/2015  . Venous insufficiency of both lower extremities, chronic 10/19/2015  . Bilateral lower extremity edema 05/24/2015  . Type 2 diabetes mellitus, controlled ([place]) 05/24/2015  . Malnutrition of moderate degree ([place]) 06/07/2014  . Acute pericarditis, unspecified 06/07/2014  .  Hypoglycemia 06/06/2014  . Fever 06/06/2014  . Knee pain, chronic 06/06/2014  . Mass of right side of neck 06/06/2014  . Severe protein-calorie malnutrition ([place]) 06/06/2014  . Microcytic anemia 06/06/2014     Past Medical History:  Diagnosis Date  . Acute pericarditis, unspecified   . Arthritis   . Bilateral swelling of feet   . Cellulitis   . Chronic  anemia   . Chronic knee pain    right  . Closed fracture of styloid process of ulna   . Cognitive decline   . Decreased appetite   . Diabetes mellitus    A1C has been normal for past 2 years and takes no diabetic meds at present  . Distal radius fracture 02/04/2016  . ECG abnormal   . Fall   . Hypertension   . Hypoglycemia   . Knee pain, chronic   . Left scapholunate ligament tear   . Lower extremity edema   . Mass of right side of neck   . Microcytic anemia   . Polio   . Radius fracture   . Severe protein-calorie malnutrition ([place])   . Swelling of joint, knee, right   . Type 2 diabetes mellitus, controlled ([place])   . Venous insufficiency      Past Surgical History:  Procedure Laterality Date  . ABDOMINAL HYSTERECTOMY    . OPEN REDUCTION INTERNAL FIXATION (ORIF) DISTAL RADIAL FRACTURE Left 02/29/2016   Procedure: LEFT DISTAL RADIUS REPAIR OF MALUNION WITH OPEN REDUCTION INTERNAL FIXATION (ORIF);  Surgeon: Iran Planas, MD;  Location: [facility];  Service: Orthopedics;  Laterality: Left;  . PAROTIDECTOMY Right 07/02/2019   Procedure: RIGHT TOTAL PAROTIDECTOMY;  Surgeon: Leta Baptist, MD;  Location: [facility];  Service: ENT;  Laterality: Right;     Family History  Problem Relation Age of Onset  . Ovarian cancer Mother   . COPD Father   . Hypertension Other   . High Cholesterol Other   . Diabetes Mellitus II Neg Hx   . Lupus Neg Hx   . Sarcoidosis Neg Hx      Social History   Socioeconomic History  . Marital status: Widowed    Spouse name: Not on file  . Number of children: 2  . Years of education: 24  . Highest education level: Not on file  Occupational History  . Occupation: retired  Tobacco Use  . Smoking status: Former Smoker    Packs/day: 0.50    Types: Cigarettes    Quit date: 09/09/1968    Years since quitting: 51.1  . Smokeless tobacco: Never Used  Substance and Sexual Activity  . Alcohol use: No  . Drug use: No  . Sexual activity: Never   Other Topics Concern  . Not on file  Social History Narrative   Has family who helps her get around.     Fun: Read   Denies abuse and feel safe at home.    Mrs Desch is widowed, retired and lives with her daughter, [name]. She also has support of her sister, Caffie Pinto, but she is 57 per [name].   [name] reports Mrs Funkhouser has to be assisted with all care needs She is assist to dependent.   Social Determinants of Health   Financial Resource Strain: Low Risk   . Difficulty of Paying Living Expenses: Not hard at all  Food Insecurity: No Food Insecurity  . Worried About Charity fundraiser in the Last Year: Never true  . Ran Out of  Food in the Last Year: Never true  Transportation Needs: No Transportation Needs  . Lack of Transportation (Medical): No  . Lack of Transportation (Non-Medical): No  Physical Activity: Inactive  . Days of Exercise per Week: 0 days  . Minutes of Exercise per Session: 0 min  Stress:   . Feeling of Stress : Not on file  Social Connections:   . Frequency of Communication with Friends and Family: Not on file  . Frequency of Social Gatherings with Friends and Family: Not on file  . Attends Religious Services: Not on file  . Active Member of Clubs or Organizations: Not on file  . Attends Archivist Meetings: Not on file  . Marital Status: Not on file  Intimate Partner Violence:   . Fear of Current or Ex-Partner: Not on file  . Emotionally Abused: Not on file  . Physically Abused: Not on file  . Sexually Abused: Not on file     Allergies  Allergen Reactions  . Orange Concentrate [Flavoring Agent] Shortness Of Breath, Itching and Swelling  . Peach [Prunus Persica] Shortness Of Breath, Itching and Swelling  . Penicillins Anaphylaxis and Hives    All 'cillins' Has patient had a PCN reaction causing immediate rash, facial/tongue/throat swelling, SOB or lightheadedness with hypotension: Yes Has patient had a PCN reaction causing severe  rash involving mucus membranes or skin necrosis: No Has patient had a PCN reaction that required hospitalization No Has patient had a PCN reaction occurring within the last 10 years: No If all of the above answers are "NO", then may proceed with Cephalosporin use.   . Strawberry Extract Shortness Of Breath, Itching and Swelling  . Tylenol [Acetaminophen] Itching    Per patient makes her itch  . Adhesive [Tape] Itching and Rash    Please use "paper" tape  . Aspirin Itching  . Latex Itching and Rash  . Other Hives, Itching and Rash    NO "-CILLINS"  . Pineapple Itching and Rash  . Strawberry Flavor Itching and Rash     Prior to Admission medications   Medication Sig Start Date End Date Taking? Authorizing Provider  acetaminophen (TYLENOL) 325 MG tablet Take 2 tablets (650 mg total) by mouth every 6 (six) hours as needed for mild pain (or Fever >/= 101). 02/18/18  Yes Vann, Jessica U, DO  cetirizine (ZYRTEC) 10 MG tablet Take 1 tablet (10 mg total) by mouth daily. 07/28/18  Yes Stallings, Zoe A, MD  clobetasol cream (TEMOVATE) 0.05 % Apply topically. 12/22/16  Yes [provider]  doxycycline (VIBRAMYCIN) 100 MG capsule Take 1 capsule (100 mg total) by mouth 2 (two) times daily. One po bid x 7 days 03/31/19  Yes Pfeiffer, Jeannie Done, MD  furosemide (LASIX) 40 MG tablet Take 1 tablet (40 mg total) by mouth daily. 01/01/19  Yes Delia Chimes A, MD  gabapentin (NEURONTIN) 300 MG capsule Take 1 capsule (300 mg total) by mouth 3 (three) times daily. 01/01/19  Yes Stallings, Zoe A, MD  potassium chloride (K-DUR) 10 MEQ tablet Take 1 tablet (10 mEq total) by mouth daily. 01/01/19  Yes Forrest Moron, MD  QUEtiapine (SEROQUEL) 50 MG tablet Take 0.5-1 tablets (25-50 mg total) by mouth at bedtime. 04/07/19  Yes Forrest Moron, MD  silver sulfADIAZINE (SILVADENE) 1 % cream Apply topically. 12/22/16  Yes [provider]  traMADol (ULTRAM) 50 MG tablet Take 1 tablet (50 mg total) by mouth  every 6 (six) hours as needed for moderate pain. 07/28/18  Yes Stallings, Zoe A, MD  triamcinolone cream (KENALOG) 0.1 % Apply 1 application topically 2 (two) times daily. 01/01/19  Yes Forrest Moron, MD     Depression screen [facility] 2/9 10/18/2019 04/07/2019 04/02/2019 04/02/2019 01/01/2019  Decreased Interest 0 0 0 0 0  Down, Depressed, Hopeless 0 0 0 0 0  PHQ - 2 Score 0 0 0 0 0     Fall Risk  10/18/2019 04/07/2019 04/02/2019 01/01/2019 12/25/2018  Falls in the past year? 0 1 1 1  0  Number falls in past yr: 1 0 0 1 0  Comment no injurys - - - -  Injury with Fall? - 0 0 0 0  Comment - - - - -  Risk Factor Category  - - - - -  Risk for fall due to : - - History of fall(s);Impaired mobility;Mental status change - -  Follow up Falls evaluation completed;Education provided Falls evaluation completed Falls prevention discussed Falls evaluation completed Falls evaluation completed      PHYSICAL EXAM: BP 137/72   Ht 5' 7.5" (1.715 m)   Wt 185 lb (83.9 kg)   BMI 28.55 kg/m    Wt Readings from Last 3 Encounters:  10/18/19 185 lb (83.9 kg)  07/02/19 185 lb 10 oz (84.2 kg)  01/01/19 194 lb 3.2 oz (88.1 kg)       Education/Counseling provided regarding diet and exercise, prevention of chronic diseases, smoking/tobacco cessation, if applicable, and reviewed "Covered Medicare Preventive Services."

## 2019-10-18 NOTE — Patient Outreach (Signed)
[facility] Care Management  10/18/2019  Korinne Diiorio 10-30-1945 [id number]   [facility] Social Work case being transferred to [employer].   Ronn Melena, BSW Social Worker [phone]

## 2019-10-26 ENCOUNTER — Ambulatory Visit: Payer: Self-pay | Admitting: Family Medicine

## 2019-10-27 ENCOUNTER — Ambulatory Visit: Payer: Self-pay | Admitting: Family Medicine

## 2019-10-27 ENCOUNTER — Encounter: Payer: Self-pay | Admitting: Family Medicine

## 2019-10-29 ENCOUNTER — Encounter: Payer: Self-pay | Admitting: Neurology

## 2019-11-08 ENCOUNTER — Ambulatory Visit: Payer: Self-pay | Admitting: Family Medicine

## 2020-01-21 ENCOUNTER — Emergency Department (HOSPITAL_COMMUNITY): Payer: Medicare Other

## 2020-01-21 ENCOUNTER — Other Ambulatory Visit: Payer: Self-pay

## 2020-01-21 ENCOUNTER — Inpatient Hospital Stay (HOSPITAL_COMMUNITY)
Admission: EM | Admit: 2020-01-21 | Discharge: 2020-01-27 | DRG: 312 | Disposition: A | Discharge status: Skilled Nursing Facility | Payer: Medicare Other | Attending: Family Medicine | Admitting: Family Medicine

## 2020-01-21 ENCOUNTER — Encounter (HOSPITAL_COMMUNITY): Payer: Self-pay

## 2020-01-21 DIAGNOSIS — E119 Type 2 diabetes mellitus without complications: Secondary | ICD-10-CM

## 2020-01-21 DIAGNOSIS — Z888 Allergy status to other drugs, medicaments and biological substances status: Secondary | ICD-10-CM

## 2020-01-21 DIAGNOSIS — F0391 Unspecified dementia with behavioral disturbance: Secondary | ICD-10-CM | POA: Diagnosis not present

## 2020-01-21 DIAGNOSIS — Z6829 Body mass index (BMI) 29.0-29.9, adult: Secondary | ICD-10-CM

## 2020-01-21 DIAGNOSIS — Z79899 Other long term (current) drug therapy: Secondary | ICD-10-CM

## 2020-01-21 DIAGNOSIS — E11649 Type 2 diabetes mellitus with hypoglycemia without coma: Secondary | ICD-10-CM | POA: Diagnosis present

## 2020-01-21 DIAGNOSIS — R600 Localized edema: Secondary | ICD-10-CM | POA: Diagnosis present

## 2020-01-21 DIAGNOSIS — Z91018 Allergy to other foods: Secondary | ICD-10-CM

## 2020-01-21 DIAGNOSIS — I10 Essential (primary) hypertension: Secondary | ICD-10-CM | POA: Diagnosis present

## 2020-01-21 DIAGNOSIS — Z880 Allergy status to penicillin: Secondary | ICD-10-CM

## 2020-01-21 DIAGNOSIS — R55 Syncope and collapse: Secondary | ICD-10-CM | POA: Diagnosis not present

## 2020-01-21 DIAGNOSIS — G8929 Other chronic pain: Secondary | ICD-10-CM | POA: Diagnosis present

## 2020-01-21 DIAGNOSIS — Z886 Allergy status to analgesic agent status: Secondary | ICD-10-CM

## 2020-01-21 DIAGNOSIS — Z87891 Personal history of nicotine dependence: Secondary | ICD-10-CM

## 2020-01-21 DIAGNOSIS — Z91040 Latex allergy status: Secondary | ICD-10-CM

## 2020-01-21 DIAGNOSIS — F03918 Unspecified dementia, unspecified severity, with other behavioral disturbance: Secondary | ICD-10-CM | POA: Diagnosis present

## 2020-01-21 DIAGNOSIS — D649 Anemia, unspecified: Secondary | ICD-10-CM | POA: Diagnosis present

## 2020-01-21 DIAGNOSIS — Z20822 Contact with and (suspected) exposure to covid-19: Secondary | ICD-10-CM | POA: Diagnosis present

## 2020-01-21 DIAGNOSIS — E43 Unspecified severe protein-calorie malnutrition: Secondary | ICD-10-CM | POA: Diagnosis present

## 2020-01-21 DIAGNOSIS — M1990 Unspecified osteoarthritis, unspecified site: Secondary | ICD-10-CM | POA: Diagnosis present

## 2020-01-21 DIAGNOSIS — Z8249 Family history of ischemic heart disease and other diseases of the circulatory system: Secondary | ICD-10-CM

## 2020-01-21 DIAGNOSIS — G4709 Other insomnia: Secondary | ICD-10-CM

## 2020-01-21 LAB — CBG MONITORING, ED: Glucose-Capillary: 77 mg/dL (ref 70–99)

## 2020-01-21 LAB — BASIC METABOLIC PANEL
Anion gap: 12 (ref 5–15)
BUN: 12 mg/dL (ref 8–23)
CO2: 22 mmol/L (ref 22–32)
Calcium: 8.7 mg/dL — ABNORMAL LOW (ref 8.9–10.3)
Chloride: 101 mmol/L (ref 98–111)
Creatinine, Ser: 0.59 mg/dL (ref 0.44–1.00)
GFR calc Af Amer: >60 mL/min (ref >60)
GFR calc non Af Amer: >60 mL/min (ref >60)
Glucose, Bld: 78 mg/dL (ref 70–99)
Potassium: 4 mmol/L (ref 3.5–5.1)
Sodium: 135 mmol/L (ref 135–145)

## 2020-01-21 LAB — URINALYSIS, ROUTINE W REFLEX MICROSCOPIC
Bacteria, UA: NONE SEEN
Bilirubin Urine: NEGATIVE
Glucose, UA: NEGATIVE mg/dL
Ketones, ur: 5 mg/dL — AB
Leukocytes,Ua: NEGATIVE
Nitrite: NEGATIVE
Protein, ur: 30 mg/dL — AB
Specific Gravity, Urine: 1.011 (ref 1.005–1.030)
pH: 7 (ref 5.0–8.0)

## 2020-01-21 LAB — CBC
HCT: 39.3 % (ref 36.0–46.0)
Hemoglobin: 11.5 g/dL — ABNORMAL LOW (ref 12.0–15.0)
MCH: 22.3 pg — ABNORMAL LOW (ref 26.0–34.0)
MCHC: 29.3 g/dL — ABNORMAL LOW (ref 30.0–36.0)
MCV: 76.2 fL — ABNORMAL LOW (ref 80.0–100.0)
Platelets: 190 10*3/uL (ref 150–400)
RBC: 5.16 MIL/uL — ABNORMAL HIGH (ref 3.87–5.11)
RDW: 14.9 % (ref 11.5–15.5)
WBC: 5.9 10*3/uL (ref 4.0–10.5)
nRBC: 0 % (ref 0.0–0.2)

## 2020-01-21 LAB — TROPONIN I (HIGH SENSITIVITY): Troponin I (High Sensitivity): 5 ng/L (ref <18)

## 2020-01-21 MED ORDER — SODIUM CHLORIDE 0.9% FLUSH
3.0000 mL | Freq: Two times a day (BID) | INTRAVENOUS | Status: DC
  Administered 2020-01-22 – 2020-01-26 (×8): 3 mL via INTRAVENOUS

## 2020-01-21 MED ORDER — ONDANSETRON HCL 4 MG PO TABS: 4.0000 mg | ORAL_TABLET | Freq: Four times a day (QID) | ORAL | Status: DC | PRN

## 2020-01-21 MED ORDER — ENOXAPARIN SODIUM 40 MG/0.4ML ~~LOC~~ SOLN
40.0000 mg | SUBCUTANEOUS | Status: DC
  Administered 2020-01-22 – 2020-01-27 (×6): 40 mg via SUBCUTANEOUS
  Filled 2020-01-21 (×4): qty 0.4

## 2020-01-21 MED ORDER — ACETAMINOPHEN 325 MG PO TABS: 650.0000 mg | ORAL_TABLET | Freq: Four times a day (QID) | ORAL | Status: DC | PRN

## 2020-01-21 MED ORDER — ONDANSETRON HCL 4 MG/2ML IJ SOLN: 4.0000 mg | Freq: Four times a day (QID) | INTRAMUSCULAR | Status: DC | PRN

## 2020-01-21 MED ORDER — ACETAMINOPHEN 650 MG RE SUPP: 650.0000 mg | Freq: Four times a day (QID) | RECTAL | Status: DC | PRN

## 2020-01-21 NOTE — H&P (Signed)
History and Physical    [name] [id number] DOB: 1946-06-16 DOA: 01/21/2020  PCP: Forrest Moron, MD  Patient coming from: Home  I have personally briefly reviewed patient's old medical records in [place]  Chief Complaint: Syncope  HPI: [name] is a 74 y.o. female with medical history significant of dementia.  Pt takes no chronic home meds per daughter.  HTN but on no HTN meds and nl BPs. DM2 but on no DM2 meds and A1Cs normal for past 6+ years.  Pt presents to ED after syncope episode at home that occurred while being given a bath.  No injury.  Pt reportedly complaining of SOB while getting bath, then LOC, short time of LOC, woke up less responsive than her baseline.  At baseline she is more alert than she is now.   ED Course: No SIRS, labs WNL, UA nl, CXR ? RLL PNA vs just actelectasis.  Hospitalist asked to admit for syncope obs.   Review of Systems: unable to perform due to dementia, pt without acute complaints.  Past Medical History:  Diagnosis Date  . Acute pericarditis, unspecified   . Arthritis   . Bilateral swelling of feet   . Cellulitis   . Chronic anemia   . Chronic knee pain    right  . Closed fracture of styloid process of ulna   . Cognitive decline   . Decreased appetite   . Diabetes mellitus    A1C has been normal for past 2 years and takes no diabetic meds at present  . Distal radius fracture 02/04/2016  . ECG abnormal   . Fall   . Hypertension   . Hypoglycemia   . Knee pain, chronic   . Left scapholunate ligament tear   . Lower extremity edema   . Mass of right side of neck   . Microcytic anemia   . Polio   . Radius fracture   . Severe protein-calorie malnutrition ([place])   . Swelling of joint, knee, right   . Type 2 diabetes mellitus, controlled ([place])   . Venous insufficiency     Past Surgical History:  Procedure Laterality Date  . ABDOMINAL HYSTERECTOMY    . OPEN REDUCTION INTERNAL FIXATION  (ORIF) DISTAL RADIAL FRACTURE Left 02/29/2016   Procedure: LEFT DISTAL RADIUS REPAIR OF MALUNION WITH OPEN REDUCTION INTERNAL FIXATION (ORIF);  Surgeon: Iran Planas, MD;  Location: [facility] [facility];  Service: Orthopedics;  Laterality: Left;  . PAROTIDECTOMY Right 07/02/2019   Procedure: RIGHT TOTAL PAROTIDECTOMY;  Surgeon: Leta Baptist, MD;  Location: [facility];  Service: ENT;  Laterality: Right;     reports that she quit smoking about 51 years ago. Her smoking use included cigarettes. She smoked 0.50 packs per day. She has never used smokeless tobacco. She reports that she does not drink alcohol or use drugs.  Allergies  Allergen Reactions  . Orange Concentrate [Flavoring Agent] Shortness Of Breath, Itching and Swelling  . Peach [Prunus Persica] Shortness Of Breath, Itching and Swelling  . Penicillins Anaphylaxis and Hives    All 'cillins' Has patient had a PCN reaction causing immediate rash, facial/tongue/throat swelling, SOB or lightheadedness with hypotension: Yes Has patient had a PCN reaction causing severe rash involving mucus membranes or skin necrosis: No Has patient had a PCN reaction that required hospitalization No Has patient had a PCN reaction occurring within the last 10 years: No If all of the above answers are "NO", then may proceed with Cephalosporin use.   Marland Kitchen  Strawberry Extract Shortness Of Breath, Itching and Swelling  . Tylenol [Acetaminophen] Itching    Per patient makes her itch  . Adhesive [Tape] Itching and Rash    Please use "paper" tape  . Aspirin Itching  . Latex Itching and Rash  . Other Hives, Itching and Rash    NO "-CILLINS"  . Pineapple Itching and Rash  . Strawberry Flavor Itching and Rash    Family History  Problem Relation Age of Onset  . Ovarian cancer Mother   . COPD Father   . Hypertension Other   . High Cholesterol Other   . Diabetes Mellitus II Neg Hx   . Lupus Neg Hx   . Sarcoidosis Neg Hx      Prior to Admission medications     Medication Sig Start Date End Date Taking? Authorizing Provider  acetaminophen (TYLENOL) 325 MG tablet Take 2 tablets (650 mg total) by mouth every 6 (six) hours as needed for mild pain (or Fever >/= 101). 02/18/18   Geradine Girt, DO  cetirizine (ZYRTEC) 10 MG tablet Take 1 tablet (10 mg total) by mouth daily. 07/28/18   Forrest Moron, MD  clobetasol cream (TEMOVATE) 0.05 % Apply topically. 12/22/16   [provider]  doxycycline (VIBRAMYCIN) 100 MG capsule Take 1 capsule (100 mg total) by mouth 2 (two) times daily. One po bid x 7 days 03/31/19   Charlesetta Shanks, MD  furosemide (LASIX) 40 MG tablet Take 1 tablet (40 mg total) by mouth daily. 01/01/19   Forrest Moron, MD  gabapentin (NEURONTIN) 300 MG capsule Take 1 capsule (300 mg total) by mouth 3 (three) times daily. 01/01/19   Forrest Moron, MD  potassium chloride (K-DUR) 10 MEQ tablet Take 1 tablet (10 mEq total) by mouth daily. 01/01/19   Forrest Moron, MD  QUEtiapine (SEROQUEL) 50 MG tablet Take 0.5-1 tablets (25-50 mg total) by mouth at bedtime. 04/07/19   Forrest Moron, MD  silver sulfADIAZINE (SILVADENE) 1 % cream Apply topically. 12/22/16   [provider]  traMADol (ULTRAM) 50 MG tablet Take 1 tablet (50 mg total) by mouth every 6 (six) hours as needed for moderate pain. 07/28/18   Forrest Moron, MD  triamcinolone cream (KENALOG) 0.1 % Apply 1 application topically 2 (two) times daily. 01/01/19   Forrest Moron, MD    Physical Exam: Vitals:   01/21/20 2025 01/21/20 2029 01/21/20 2115 01/21/20 2245  BP:   (!) 104/59 120/70  Pulse:   87 86  Resp:   18 13  Temp:   (!) 97.3 F (36.3 C)   TempSrc:   Rectal   SpO2: 99%  100% 99%  Weight:  84 kg    Height:  5' 7.5" (1.715 m)      Constitutional: NAD, calm, comfortable Eyes: PERRL, lids and conjunctivae normal ENMT: Mucous membranes are moist. Posterior pharynx clear of any exudate or lesions.Normal dentition.  Neck: normal, supple, no masses, no  thyromegaly Respiratory: clear to auscultation bilaterally, no wheezing, no crackles. Normal respiratory effort. No accessory muscle use.  Cardiovascular: Regular rate and rhythm, no murmurs / rubs / gallops. 1-2+ BLE edema 2+ pedal pulses. No carotid bruits.  Abdomen: no tenderness, no masses palpated. No hepatosplenomegaly. Bowel sounds positive.  Musculoskeletal: no clubbing / cyanosis. No joint deformity upper and lower extremities. Good ROM, no contractures. Normal muscle tone.  Skin: no rashes, lesions, ulcers. No induration Neurologic: CN 2-12 grossly intact. Sensation intact, DTR normal. Strength 5/5  in all 4.  Psychiatric: Sleepy but wakes up to light touch before going back to sleep.   Labs on Admission: I have personally reviewed following labs and imaging studies  CBC: Recent Labs  Lab 01/21/20 2136  WBC 5.9  HGB 11.5*  HCT 39.3  MCV 76.2*  PLT [id number]   Basic Metabolic Panel: Recent Labs  Lab 01/21/20 2136  NA 135  K 4.0  CL 101  CO2 22  GLUCOSE 78  BUN 12  CREATININE 0.59  CALCIUM 8.7*   GFR: Estimated Creatinine Clearance: 67.3 mL/min (by C-G formula based on SCr of 0.59 mg/dL). Liver Function Tests: No results for input(s): AST, ALT, ALKPHOS, BILITOT, PROT, ALBUMIN in the last 168 hours. No results for input(s): LIPASE, AMYLASE in the last 168 hours. No results for input(s): AMMONIA in the last 168 hours. Coagulation Profile: No results for input(s): INR, PROTIME in the last 168 hours. Cardiac Enzymes: No results for input(s): CKTOTAL, CKMB, CKMBINDEX, TROPONINI in the last 168 hours. BNP (last 3 results) No results for input(s): PROBNP in the last 8760 hours. HbA1C: No results for input(s): HGBA1C in the last 72 hours. CBG: Recent Labs  Lab 01/21/20 2114  GLUCAP 77   Lipid Profile: No results for input(s): CHOL, HDL, LDLCALC, TRIG, CHOLHDL, LDLDIRECT in the last 72 hours. Thyroid Function Tests: No results for input(s): TSH, T4TOTAL, FREET4,  T3FREE, THYROIDAB in the last 72 hours. Anemia Panel: No results for input(s): VITAMINB12, FOLATE, FERRITIN, TIBC, IRON, RETICCTPCT in the last 72 hours. Urine analysis:    Component Value Date/Time   COLORURINE STRAW (A) 01/21/2020 2055   APPEARANCEUR CLEAR 01/21/2020 2055   LABSPEC 1.011 01/21/2020 2055   PHURINE 7.0 01/21/2020 2055   GLUCOSEU NEGATIVE 01/21/2020 2055   HGBUR SMALL (A) 01/21/2020 2055   BILIRUBINUR NEGATIVE 01/21/2020 2055   KETONESUR 5 (A) 01/21/2020 2055   PROTEINUR 30 (A) 01/21/2020 2055   UROBILINOGEN 1.0 06/06/2014 1311   NITRITE NEGATIVE 01/21/2020 2055   LEUKOCYTESUR NEGATIVE 01/21/2020 2055    Radiological Exams on Admission: DG Chest Portable 1 View  Result Date: 01/21/2020 CLINICAL DATA:  Shortness of breath EXAM: PORTABLE CHEST 1 VIEW COMPARISON:  September 18, 2017 FINDINGS: The heart size and mediastinal contours are within normal limits. Aortic knob calcifications. Hazy rounded patchy airspace opacity seen at the left lung base. The right lung is clear. No acute osseous abnormality. IMPRESSION: Hazy airspace opacity at the left lung base which may be due to atelectasis and/or early infectious etiology. Electronically Signed   By: Prudencio Pair M.D.   On: 01/21/2020 22:34    EKG: Independently reviewed.  Assessment/Plan Principal Problem:   Syncope Active Problems:   Type 2 diabetes mellitus, controlled ([place])   Dementia with behavioral problem ([place])    1. Syncope - 1. Syncope pathway 2. 2d echo in AM 3. Tele monitor 4. Repeat CBC/BMP in AM 2. ? RLL PNA vs atelectasis on CXR - 1. Pt satting 100% on RA 2. No SIRS 3. Watch for development of SIRS, cough, hypoxia or other signs or symptoms that could indicate PNA 4. Checking procalcitonin 3. Dementia - 1. Chronic 2. More sleepy than baseline 3. obs for now 4. On no home meds 4. DM2 - 1. Chart diagnosis 2. On no home meds and A1C nl for past 6 years  DVT prophylaxis: Lovenox Code Status:  Full Family Communication: Spoke with daughter on phone Disposition Plan: Home after syncope rule out Consults called: None Admission status: Place  in obs    Makailey Hodgkin, [place] [facility]  How to contact the [facility] Attending or Consulting provider [place] or covering provider during after hours [place], for this patient?  1. Check the care team in [facility] and look for a) attending/consulting [facility] provider listed and b) the [facility] team listed 2. Log into [email or web address]  Amion Physician Scheduling and messaging for groups and whole hospitals  On call and physician scheduling software for group practices, residents, hospitalists and other medical providers for call, clinic, rotation and shift schedules. On[employer] is a hospital-wide system for scheduling doctors and paging doctors on call. EasyPlot is for scientific plotting and data analysis.  [email or web address]  and use 's universal password to access. If you do not have the password, please contact the hospital operator.  3. Locate the [facility] provider you are looking for under [facility] and page to a number that you can be directly reached. 4. If you still have difficulty reaching the provider, please page the [facility] (Director on Call) for the Hospitalists listed on amion for assistance.  01/21/2020, 11:41 PM

## 2020-01-21 NOTE — ED Triage Notes (Signed)
Pt BIB [facility] for eval of syncopal episode during bath. Family reports pt lost consciousness during bedbath, pt was assisted to laying position in bed. No fall, no thinners. Pt is currently at cognitive baseline,  dementia w/ minimal verbalization. No s/sx of injury. EMS reports foul smelling urine

## 2020-01-21 NOTE — ED Notes (Signed)
Vincente Liberty, son in law, [phone] would like an update when available (pt lives with him)

## 2020-01-21 NOTE — ED Notes (Signed)
4 oz of juice given for CBG 77

## 2020-01-21 NOTE — ED Provider Notes (Signed)
[facility] EMERGENCY DEPARTMENT Provider Note   CSN: [id number] Arrival date & time: 01/21/20  2024     History Chief Complaint  Patient presents with   Loss of Consciousness    [name] is a 74 y.o. female.  HPI Level 5 caveat due to dementia with minimal verbalization at baseline. Patient reportedly had a 5-minute syncopal episode during a bath in the bed today.  Reportedly no injury.  Reportedly then returned to baseline.  History of dementia.  Question of foul-smelling urine.  Family reportedly with patient but has not been able to make it back to the room yet.  Discussed the patient's daughter.  Patient reportedly had been complaining shortness of breath while she is getting clean.  Then passed out.  Reportedly unconscious for a little while.  Woke up less responsive.  Patient's baseline is talking and joking with you.    Past Medical History:  Diagnosis Date   Acute pericarditis, unspecified    Arthritis    Bilateral swelling of feet    Cellulitis    Chronic anemia    Chronic knee pain    right   Closed fracture of styloid process of ulna    Cognitive decline    Decreased appetite    Diabetes mellitus    A1C has been normal for past 2 years and takes no diabetic meds at present   Distal radius fracture 02/04/2016   ECG abnormal    Fall    Hypertension    Hypoglycemia    Knee pain, chronic    Left scapholunate ligament tear    Lower extremity edema    Mass of right side of neck    Microcytic anemia    Polio    Radius fracture    Severe protein-calorie malnutrition (HCC)    Swelling of joint, knee, right    Type 2 diabetes mellitus, controlled ([place])    Venous insufficiency     Patient Active Problem List   Diagnosis Date Noted   H/O parotidectomy 07/02/2019   Dementia with behavioral problem ([place]) 03/09/2018   Edema 02/16/2018   Physical deconditioning 04/04/2017   Eczema 03/24/2017    Decreased independence with activities of daily living 03/24/2017   Toenail deformity 03/24/2017   Decreased mobility and endurance 02/07/2017   Cellulitis    [place] scabies 12/18/2016   Community acquired pneumonia of right lower lobe of lung    Dehydration    Failure to thrive in adult    Impetigo    Rash of entire body 12/06/2016   Localized swelling, mass, or lump of lower extremity, bilateral 12/06/2016   Constipation 03/24/2016   Fall 02/04/2016   Chronic anemia 02/04/2016   Cognitive decline 02/04/2016   Cellulitis of right arm 01/19/2016   Swelling of joint, knee, right 10/19/2015   Venous insufficiency of both lower extremities, chronic 10/19/2015   Bilateral lower extremity edema 05/24/2015   Type 2 diabetes mellitus, controlled ([place]) 05/24/2015   Malnutrition of moderate degree ([place]) 06/07/2014   Acute pericarditis, unspecified 06/07/2014   Hypoglycemia 06/06/2014   Fever 06/06/2014   Knee pain, chronic 06/06/2014   Mass of right side of neck 06/06/2014   Severe protein-calorie malnutrition ([place]) 06/06/2014   Microcytic anemia 06/06/2014    Past Surgical History:  Procedure Laterality Date   ABDOMINAL HYSTERECTOMY     OPEN REDUCTION INTERNAL FIXATION (ORIF) DISTAL RADIAL FRACTURE Left 02/29/2016   Procedure: LEFT DISTAL RADIUS REPAIR OF MALUNION WITH OPEN REDUCTION INTERNAL  FIXATION (ORIF);  Surgeon: Iran Planas, MD;  Location: [facility];  Service: Orthopedics;  Laterality: Left;   PAROTIDECTOMY Right 07/02/2019   Procedure: RIGHT TOTAL PAROTIDECTOMY;  Surgeon: Leta Baptist, MD;  Location: [place];  Service: ENT;  Laterality: Right;     OB History   No obstetric history on file.     Family History  Problem Relation Age of Onset   Ovarian cancer Mother    COPD Father    Hypertension Other    High Cholesterol Other    Diabetes Mellitus II Neg Hx    Lupus Neg Hx    Sarcoidosis Neg Hx     Social History    Tobacco Use   Smoking status: Former Smoker    Packs/day: 0.50    Types: Cigarettes    Quit date: 09/09/1968    Years since quitting: 51.4   Smokeless tobacco: Never Used  Substance Use Topics   Alcohol use: No   Drug use: No    Home Medications Prior to Admission medications   Medication Sig Start Date End Date Taking? Authorizing Provider  acetaminophen (TYLENOL) 325 MG tablet Take 2 tablets (650 mg total) by mouth every 6 (six) hours as needed for mild pain (or Fever >/= 101). 02/18/18   Geradine Girt, DO  cetirizine (ZYRTEC) 10 MG tablet Take 1 tablet (10 mg total) by mouth daily. 07/28/18   Forrest Moron, MD  clobetasol cream (TEMOVATE) 0.05 % Apply topically. 12/22/16   [provider]  doxycycline (VIBRAMYCIN) 100 MG capsule Take 1 capsule (100 mg total) by mouth 2 (two) times daily. One po bid x 7 days 03/31/19   Charlesetta Shanks, MD  furosemide (LASIX) 40 MG tablet Take 1 tablet (40 mg total) by mouth daily. 01/01/19   Forrest Moron, MD  gabapentin (NEURONTIN) 300 MG capsule Take 1 capsule (300 mg total) by mouth 3 (three) times daily. 01/01/19   Forrest Moron, MD  potassium chloride (K-DUR) 10 MEQ tablet Take 1 tablet (10 mEq total) by mouth daily. 01/01/19   Forrest Moron, MD  QUEtiapine (SEROQUEL) 50 MG tablet Take 0.5-1 tablets (25-50 mg total) by mouth at bedtime. 04/07/19   Forrest Moron, MD  silver sulfADIAZINE (SILVADENE) 1 % cream Apply topically. 12/22/16   [provider]  traMADol (ULTRAM) 50 MG tablet Take 1 tablet (50 mg total) by mouth every 6 (six) hours as needed for moderate pain. 07/28/18   Forrest Moron, MD  triamcinolone cream (KENALOG) 0.1 % Apply 1 application topically 2 (two) times daily. 01/01/19   Forrest Moron, MD    Allergies    Orange concentrate [flavoring agent], Peach [prunus persica], Penicillins, Strawberry extract, Tylenol [acetaminophen], Adhesive [tape], Aspirin, Latex, Other, Pineapple, and Strawberry  flavor  Review of Systems   Review of Systems  Unable to perform ROS: Dementia    Physical Exam Updated Vital Signs BP 120/70    Pulse 86    Temp (!) 97.3 F (36.3 C) (Rectal)    Resp 13    Ht 5' 7.5" (1.715 m)    Wt 84 kg    SpO2 99%    BMI 28.58 kg/m   Physical Exam Vitals reviewed.  HENT:     Head: Atraumatic.  Cardiovascular:     Rate and Rhythm: Regular rhythm.  Pulmonary:     Breath sounds: No wheezing or rhonchi.  Abdominal:     Tenderness: There is no abdominal tenderness.  Musculoskeletal:  Right lower leg: Edema present.     Left lower leg: Edema present.     Comments: Chronic edema bilateral lower extremities.  Skin:    Capillary Refill: Capillary refill takes less than 2 seconds.  Neurological:     Comments: Patient laying in bed on her left side.  Somewhat curled up.  No apparent tenderness.  Mostly nonverbal and minimal responsiveness for me.   On reexam patient is more awake.  Answering questions.  Moves extremities.  Does have dementia but able to tell me that she was feeling like there was choking. ED Results / Procedures / Treatments   Labs (all labs ordered are listed, but only abnormal results are displayed) Labs Reviewed  URINALYSIS, ROUTINE W REFLEX MICROSCOPIC - Abnormal; Notable for the following components:      Result Value   Color, Urine STRAW (*)    Hgb urine dipstick SMALL (*)    Ketones, ur 5 (*)    Protein, ur 30 (*)    All other components within normal limits  BASIC METABOLIC PANEL - Abnormal; Notable for the following components:   Calcium 8.7 (*)    All other components within normal limits  CBC - Abnormal; Notable for the following components:   RBC 5.16 (*)    Hemoglobin 11.5 (*)    MCV 76.2 (*)    MCH 22.3 (*)    MCHC 29.3 (*)    All other components within normal limits  URINE CULTURE  CBG MONITORING, ED  TROPONIN I (HIGH SENSITIVITY)  TROPONIN I (HIGH SENSITIVITY)    EKG EKG Interpretation  Date/Time:  Friday  Jan 21 2020 21:16:17 EDT Ventricular Rate:  90 PR Interval:    QRS Duration: 97 QT Interval:  373 QTC Calculation: 457 R Axis:   28 Text Interpretation: Sinus rhythm Left atrial enlargement Anteroseptal infarct, old Confirmed by Davonna Belling [id number]) on 01/21/2020 9:18:12 PM   Radiology DG Chest Portable 1 View  Result Date: 01/21/2020 CLINICAL DATA:  Shortness of breath EXAM: PORTABLE CHEST 1 VIEW COMPARISON:  September 18, 2017 FINDINGS: The heart size and mediastinal contours are within normal limits. Aortic knob calcifications. Hazy rounded patchy airspace opacity seen at the left lung base. The right lung is clear. No acute osseous abnormality. IMPRESSION: Hazy airspace opacity at the left lung base which may be due to atelectasis and/or early infectious etiology. Electronically Signed   By: Prudencio Pair M.D.   On: 01/21/2020 22:34    Procedures Procedures (including critical care time)  Medications Ordered in ED Medications - No data to display  ED Course  I have reviewed the triage vital signs and the nursing notes.  Pertinent labs & imaging results that were available during my care of the patient were reviewed by me and considered in my medical decision making (see chart for details).    MDM Rules/Calculators/A&P                      Patient presented after syncopal episode.  Was short of breath then passed out.  X-ray showed atelectasis versus pneumonia.  Not hypoxic.  However the episode happened while she was getting cleaned.  With the shortness of breath potentially could have been other cause such as arrhythmia.  Will discuss with admitting team about admission for monitoring of the syncope. Final Clinical Impression(s) / ED Diagnoses Final diagnoses:  Syncope, unspecified syncope type    Rx / DC Orders ED Discharge Orders    None  Davonna Belling, MD 01/21/20 2342

## 2020-01-22 ENCOUNTER — Observation Stay (HOSPITAL_COMMUNITY): Payer: Medicare Other

## 2020-01-22 ENCOUNTER — Observation Stay (HOSPITAL_BASED_OUTPATIENT_CLINIC_OR_DEPARTMENT_OTHER): Payer: Medicare Other

## 2020-01-22 ENCOUNTER — Other Ambulatory Visit: Payer: Self-pay

## 2020-01-22 DIAGNOSIS — I361 Nonrheumatic tricuspid (valve) insufficiency: Secondary | ICD-10-CM | POA: Diagnosis not present

## 2020-01-22 DIAGNOSIS — I340 Nonrheumatic mitral (valve) insufficiency: Secondary | ICD-10-CM

## 2020-01-22 DIAGNOSIS — F0391 Unspecified dementia with behavioral disturbance: Secondary | ICD-10-CM | POA: Diagnosis not present

## 2020-01-22 DIAGNOSIS — E11620 Type 2 diabetes mellitus with diabetic dermatitis: Secondary | ICD-10-CM

## 2020-01-22 DIAGNOSIS — R7989 Other specified abnormal findings of blood chemistry: Secondary | ICD-10-CM

## 2020-01-22 DIAGNOSIS — R55 Syncope and collapse: Secondary | ICD-10-CM | POA: Diagnosis not present

## 2020-01-22 LAB — CBC
HCT: 34.8 % — ABNORMAL LOW (ref 36.0–46.0)
Hemoglobin: 10.3 g/dL — ABNORMAL LOW (ref 12.0–15.0)
MCH: 22.4 pg — ABNORMAL LOW (ref 26.0–34.0)
MCHC: 29.6 g/dL — ABNORMAL LOW (ref 30.0–36.0)
MCV: 75.7 fL — ABNORMAL LOW (ref 80.0–100.0)
Platelets: 215 10*3/uL (ref 150–400)
RBC: 4.6 MIL/uL (ref 3.87–5.11)
RDW: 14.9 % (ref 11.5–15.5)
WBC: 5.8 10*3/uL (ref 4.0–10.5)
nRBC: 0 % (ref 0.0–0.2)

## 2020-01-22 LAB — BASIC METABOLIC PANEL
Anion gap: 5 (ref 5–15)
BUN: 10 mg/dL (ref 8–23)
CO2: 29 mmol/L (ref 22–32)
Calcium: 8.9 mg/dL (ref 8.9–10.3)
Chloride: 106 mmol/L (ref 98–111)
Creatinine, Ser: 0.65 mg/dL (ref 0.44–1.00)
GFR calc Af Amer: >60 mL/min (ref >60)
GFR calc non Af Amer: >60 mL/min (ref >60)
Glucose, Bld: 85 mg/dL (ref 70–99)
Potassium: 3.7 mmol/L (ref 3.5–5.1)
Sodium: 140 mmol/L (ref 135–145)

## 2020-01-22 LAB — IRON AND TIBC
Iron: 74 ug/dL (ref 28–170)
Saturation Ratios: 40 % — ABNORMAL HIGH (ref 10.4–31.8)
TIBC: 186 ug/dL — ABNORMAL LOW (ref 250–450)
UIBC: 112 ug/dL

## 2020-01-22 LAB — TROPONIN I (HIGH SENSITIVITY): Troponin I (High Sensitivity): 7 ng/L (ref <18)

## 2020-01-22 LAB — SARS CORONAVIRUS 2 BY RT PCR (HOSPITAL ORDER, PERFORMED IN ~~LOC~~ HOSPITAL LAB): SARS Coronavirus 2: NEGATIVE

## 2020-01-22 LAB — CBG MONITORING, ED
Glucose-Capillary: 51 mg/dL — ABNORMAL LOW (ref 70–99)
Glucose-Capillary: 72 mg/dL (ref 70–99)

## 2020-01-22 LAB — GLUCOSE, CAPILLARY
Glucose-Capillary: 51 mg/dL — ABNORMAL LOW (ref 70–99)
Glucose-Capillary: 74 mg/dL (ref 70–99)
Glucose-Capillary: 153 mg/dL — ABNORMAL HIGH (ref 70–99)

## 2020-01-22 LAB — VITAMIN B12: Vitamin B-12: 204 pg/mL (ref 180–914)

## 2020-01-22 LAB — ECHOCARDIOGRAM COMPLETE
Height: 67.5 in
Weight: 2962.98 oz

## 2020-01-22 LAB — PROCALCITONIN: Procalcitonin: <0.1 ng/mL

## 2020-01-22 LAB — MRSA PCR SCREENING: MRSA by PCR: NEGATIVE

## 2020-01-22 LAB — D-DIMER, QUANTITATIVE: D-Dimer, Quant: 1.66 ug/mL-FEU — ABNORMAL HIGH (ref 0.00–0.50)

## 2020-01-22 LAB — FERRITIN: Ferritin: 137 ng/mL (ref 11–307)

## 2020-01-22 LAB — FOLATE: Folate: 8.8 ng/mL (ref >5.9)

## 2020-01-22 NOTE — Plan of Care (Signed)
  Problem: Health Behavior/Discharge Planning: Goal: Ability to manage health-related needs will improve Outcome: Progressing   Problem: Clinical Measurements: Goal: Will remain free from infection Outcome: Progressing   

## 2020-01-22 NOTE — Progress Notes (Signed)
Hypoglycemic Event  CBG: 51  Treatment: OJ  Symptoms:drowsy   Follow-up CBG: [id number] CBG Result:74  Possible Reasons for Event: Poor po intake  Comments/MD notified:Dr [place]     Celester Lech, Jolene Schimke

## 2020-01-22 NOTE — Progress Notes (Signed)
VASCULAR LAB PRELIMINARY  PRELIMINARY  PRELIMINARY  PRELIMINARY  Bilateral lower extremity venous duplex completed.    Preliminary report:  See CV proc for preliminary results.  KANADY, CANDACE, RVT 01/22/2020, 6:21 PM

## 2020-01-22 NOTE — ED Notes (Signed)
Breakfast Ordered 

## 2020-01-22 NOTE — TOC Initial Note (Signed)
Transition of Care [facility]) - Initial/Assessment Note    Patient Details  Name: [name] MRN: [id number] Date of Birth: 1945-10-30  [facility] CM/SW Contact:    Verdell Carmine, RN Phone Number: 01/22/2020, 3:07 PM  Clinical Narrative:                 Admitted for syncope, had this syncopal episode in the bathtub. Has some dementia and HPTN , but takes no chronic medications at home. Currently lives with daughter. PT and OT consulted to  Assess for need and recommendations  Expected Discharge Plan: [place] Barriers to Discharge: Continued Medical Work up   Patient Goals and CMS Choice        Expected Discharge Plan and Services Expected Discharge Plan: [place]                                              Prior Living Arrangements/Services   Lives with:: Adult Children          Need for Family Participation in Patient Care: Yes (Comment) Care giver support system in place?: Yes (comment)   Criminal Activity/Legal Involvement Pertinent to Current Situation/Hospitalization: No - Comment as needed  Activities of Daily Living      Permission Sought/Granted                  Emotional Assessment Appearance:: Appears stated age     Orientation: : Oriented to Self, Fluctuating Orientation (Suspected and/or reported Sundowners) Alcohol / Substance Use: Tobacco Use Psych Involvement: No (comment)  Admission diagnosis:  Syncope [R55] Syncope, unspecified syncope type [R55] Patient Active Problem List   Diagnosis Date Noted  . Syncope 01/21/2020  . H/O parotidectomy 07/02/2019  . Dementia with behavioral problem ([place]) 03/09/2018  . Edema 02/16/2018  . Physical deconditioning 04/04/2017  . Eczema 03/24/2017  . Decreased independence with activities of daily living 03/24/2017  . Toenail deformity 03/24/2017  . Decreased mobility and endurance 02/07/2017  . Cellulitis   . [place]  scabies 12/18/2016  . Community acquired pneumonia of right lower lobe of lung   . Dehydration   . Failure to thrive in adult   . Impetigo   . Rash of entire body 12/06/2016  . Localized swelling, mass, or lump of lower extremity, bilateral 12/06/2016  . Constipation 03/24/2016  . Fall 02/04/2016  . Chronic anemia 02/04/2016  . Cognitive decline 02/04/2016  . Cellulitis of right arm 01/19/2016  . Swelling of joint, knee, right 10/19/2015  . Venous insufficiency of both lower extremities, chronic 10/19/2015  . Bilateral lower extremity edema 05/24/2015  . Type 2 diabetes mellitus, controlled ([place]) 05/24/2015  . Malnutrition of moderate degree ([place]) 06/07/2014  . Acute pericarditis, unspecified 06/07/2014  . Hypoglycemia 06/06/2014  . Fever 06/06/2014  . Knee pain, chronic 06/06/2014  . Mass of right side of neck 06/06/2014  . Severe protein-calorie malnutrition ([place]) 06/06/2014  . Microcytic anemia 06/06/2014   PCP:  Forrest Moron, MD Pharmacy:   [facility] [place], [place] - [address] [place] [address] Phone: [phone] Fax: [phone]     Social Determinants of Health (SDOH) Interventions    Readmission Risk Interventions No flowsheet data found.

## 2020-01-22 NOTE — ED Notes (Signed)
Pt given to 2 orange juices for glucose

## 2020-01-22 NOTE — Progress Notes (Signed)
PROGRESS NOTE    [name]  [id number] DOB: September 30, 1945 DOA: 01/21/2020 PCP: Forrest Moron, MD    Brief Narrative:  [name] is a 74 year old female with past medical history remarkable for dementia who presented to the ED following a syncopal episode at home during a bath.  Patient was reportedly complaining of some shortness of breath while getting in the bath and had a short moment of loss of consciousness; upon awaking, she was less responsive than her normal baseline.  Patient with previous history of hypertension and diabetes no longer on any outpatient treatment.  ED, afebrile without leukocytosis.  Hemoglobin 10.3, sodium 140, potassium 3.7, creatinine 0.65, procalcitonin less than 0.10, troponin V, 7.  Urinalysis unrevealing.  Covid-19 PCR negative.  Chest x-ray with left lower base haziness consistent with atelectasis versus early infection.   Assessment & Plan:   Principal Problem:   Syncope Active Problems:   Type 2 diabetes mellitus, controlled ([place])   Dementia with behavioral problem ([place])  Syncope Patient presented following syncopal episode while having a bed bath.  Daughter denies any shaking moments, loss of bowel or bladder or tongue biting during the event.  Daughter reports that mother has been complaining of some leg pain, uncertain laterality.  Patient is afebrile without leukocytosis.  Urinalysis, checks x-ray unrevealing.  TTE with preserved EF, no LV wall motion abnormalities, grade 1 diastolic dysfunction, normal IVC, normal RV function no aortic stenosis. --Suspect etiology vasovagal while taking a bath --Continue to monitor on telemetry --PT/OT evaluation  Right lower extremity edema with elevated D-dimer Right lower extremity slightly asymmetric versus left.  D-dimer elevated 1.66. --Check vascular duplex ultrasound bilateral lower extremity  History of type 2 diabetes mellitus Diet controlled.  Hemoglobin A1c 5.2  01/01/2020.  Patient with mild hypoglycemic event this morning, improved with orange juice. --Continue monitor CBGs before every meal/at bedtime --Sensitive insulin sliding scale for further coverage  History of essential hypertension Currently not on any home antihypertensive regimen.  BP 124/58.  --Continue to monitor blood pressure closely  Microcytic anemia Hemoglobin 10.3, MCV 75.7.  Iron level 74, TIBC 186, ferritin 137, B12 204. --Stable  Dementia --Continue supportive care  Weakness, debility: Daughter reports ambulatory with assistance at baseline. --PT/OT evaluation  DVT prophylaxis: Lovenox Code Status: Full code Family Communication: Updated patient's daughter, [name] via telephone this afternoon  Disposition Plan:  Status is: Observation  The patient remains OBS appropriate and will d/c before 2 midnights.  Dispo: The patient is from: Home              Anticipated d/c is to: Home              Anticipated d/c date is: 1 day              Patient currently is not medically stable to d/c.  Consultants:   None  Procedures:  TTE IMPRESSIONS    1. Left ventricular ejection fraction, by estimation, is 60 to 65%. The  left ventricle has normal function. The left ventricle has no regional  wall motion abnormalities. There is moderate left ventricular hypertrophy.  Left ventricular diastolic  parameters are consistent with Grade I diastolic dysfunction (impaired  relaxation).  2. Right ventricular systolic function is normal. The right ventricular  size is normal.  3. Nodular calcification of anterior leaflet tip. The mitral valve is  normal in structure. Mild to moderate mitral valve regurgitation. No  evidence of mitral stenosis.  4. Tricuspid valve regurgitation  is moderate.  5. The aortic valve is tricuspid. Aortic valve regurgitation is trivial.  Mild to moderate aortic valve sclerosis/calcification is present, without  any evidence of aortic  stenosis.  6. The inferior vena cava is normal in size with greater than 50%  respiratory variability, suggesting right atrial pressure of 3 mmHg.   Vascular duplex venous ultrasound bilateral lower extremities: Pending  Antimicrobials:   None   Subjective: Patient seen and examined bedside, resting comfortably.  Pleasantly confused.  No family present.  No specific complaints at this time.  Denies headache, no chest pain, no palpitations, no current shortness of breath, no nausea cefonicid/diarrhea, no fever/chills/night sweats, no abdominal pain.  No acute events overnight per nursing staff.  Objective: Vitals:   01/22/20 0700 01/22/20 0800 01/22/20 0900 01/22/20 1100  BP: 126/77 136/70 (!) 134/58 (!) 124/57  Pulse: 93 87 91 90  Resp: 18 16 16 14   Temp:      TempSrc:      SpO2: 99% 100% 100% 99%  Weight:      Height:       No intake or output data in the 24 hours ending 01/22/20 1209 Filed Weights   01/21/20 2029  Weight: 84 kg    Examination:  General exam: Appears calm and comfortable, pleasantly confused Respiratory system: Clear to auscultation. Respiratory effort normal.  Oxygenating well on room air Cardiovascular system: S1 & S2 heard, RRR. No JVD, murmurs, rubs, gallops or clicks.  Right lower extremity asymmetric in terms of edema versus left Gastrointestinal system: Abdomen is nondistended, soft and nontender. No organomegaly or masses felt. Normal bowel sounds heard. Central nervous system: Alert.  Not oriented to person/place/time/situation, no focal neurological deficits. Extremities: Symmetric 5 x 5 power. Skin: No rashes, lesions or ulcers Psychiatry: Judgement and insight appear poor. Mood & affect appropriate.     Data Reviewed: I have personally reviewed following labs and imaging studies  CBC: Recent Labs  Lab 01/21/20 2136 01/22/20 0210  WBC 5.9 5.8  HGB 11.5* 10.3*  HCT 39.3 34.8*  MCV 76.2* 75.7*  PLT 190 [id number]   Basic Metabolic  Panel: Recent Labs  Lab 01/21/20 2136 01/22/20 0210  NA 135 140  K 4.0 3.7  CL 101 106  CO2 22 29  GLUCOSE 78 85  BUN 12 10  CREATININE 0.59 0.65  CALCIUM 8.7* 8.9   GFR: Estimated Creatinine Clearance: 67.3 mL/min (by C-G formula based on SCr of 0.65 mg/dL). Liver Function Tests: No results for input(s): AST, ALT, ALKPHOS, BILITOT, PROT, ALBUMIN in the last 168 hours. No results for input(s): LIPASE, AMYLASE in the last 168 hours. No results for input(s): AMMONIA in the last 168 hours. Coagulation Profile: No results for input(s): INR, PROTIME in the last 168 hours. Cardiac Enzymes: No results for input(s): CKTOTAL, CKMB, CKMBINDEX, TROPONINI in the last 168 hours. BNP (last 3 results) No results for input(s): PROBNP in the last 8760 hours. HbA1C: No results for input(s): HGBA1C in the last 72 hours. CBG: Recent Labs  Lab 01/21/20 2114 01/22/20 0739 01/22/20 0938  GLUCAP 77 51* 72   Lipid Profile: No results for input(s): CHOL, HDL, LDLCALC, TRIG, CHOLHDL, LDLDIRECT in the last 72 hours. Thyroid Function Tests: No results for input(s): TSH, T4TOTAL, FREET4, T3FREE, THYROIDAB in the last 72 hours. Anemia Panel: Recent Labs    01/22/20 0920  VITAMINB12 204  FOLATE 8.8  FERRITIN 137  TIBC 186*  IRON 74   Sepsis Labs: Recent Labs  Lab 01/22/20  0116  PROCALCITON <0.10    Recent Results (from the past 240 hour(s))  SARS Coronavirus 2 by RT PCR (hospital order, performed in [facility] hospital lab) Nasopharyngeal Nasopharyngeal Swab     Status: None   Collection Time: 01/22/20  7:42 AM   Specimen: Nasopharyngeal Swab  Result Value Ref Range Status   SARS Coronavirus 2 NEGATIVE NEGATIVE Final    Comment: (NOTE) SARS-CoV-2 target nucleic acids are NOT DETECTED. The SARS-CoV-2 RNA is generally detectable in upper and lower respiratory specimens during the acute phase of infection. The lowest concentration of SARS-CoV-2 viral copies this assay can detect is  250 copies / mL. A negative result does not preclude SARS-CoV-2 infection and should not be used as the sole basis for treatment or other patient management decisions.  A negative result may occur with improper specimen collection / handling, submission of specimen other than nasopharyngeal swab, presence of viral mutation(s) within the areas targeted by this assay, and inadequate number of viral copies (<250 copies / mL). A negative result must be combined with clinical observations, patient history, and epidemiological information. Fact Sheet for Patients:   StrictlyIdeas.no Fact Sheet for Healthcare Providers: BankingDealers.co.za This test is not yet approved or cleared  by the [place] FDA and has been authorized for detection and/or diagnosis of SARS-CoV-2 by FDA under an Emergency Use Authorization (EUA).  This EUA will remain in effect (meaning this test can be used) for the duration of the COVID-19 declaration under Section 564(b)(1) of the Act, 21 U.S.C. section 360bbb-3(b)(1), unless the authorization is terminated or revoked sooner. Performed at [facility], [place] [address], [place], [place] [address]          Radiology Studies: DG Chest Portable 1 View  Result Date: 01/21/2020 CLINICAL DATA:  Shortness of breath EXAM: PORTABLE CHEST 1 VIEW COMPARISON:  September 18, 2017 FINDINGS: The heart size and mediastinal contours are within normal limits. Aortic knob calcifications. Hazy rounded patchy airspace opacity seen at the left lung base. The right lung is clear. No acute osseous abnormality. IMPRESSION: Hazy airspace opacity at the left lung base which may be due to atelectasis and/or early infectious etiology. Electronically Signed   By: Prudencio Pair M.D.   On: 01/21/2020 22:34   ECHOCARDIOGRAM COMPLETE  Result Date: 01/22/2020    ECHOCARDIOGRAM REPORT   Patient Name:   [name] Date of Exam: 01/22/2020  Medical Rec #:  [id number]         Height:       67.5 in Accession #:    [id number]        Weight:       185.2 lb Date of Birth:  1946/08/19         BSA:          1.968 m Patient Age:    23 years          BP:           134/58 mmHg Patient Gender: F                 HR:           78 bpm. Exam Location:  Inpatient Procedure: 2D Echo, Color Doppler and Cardiac Doppler Indications:    R55 Syncope  History:        Patient has prior history of Echocardiogram examinations, most                 recent 02/16/2018.  Sonographer:    Merrie Roof RDCS Referring Phys: [place]  1. Left ventricular ejection fraction, by estimation, is 60 to 65%. The left ventricle has normal function. The left ventricle has no regional wall motion abnormalities. There is moderate left ventricular hypertrophy. Left ventricular diastolic parameters are consistent with Grade I diastolic dysfunction (impaired relaxation).  2. Right ventricular systolic function is normal. The right ventricular size is normal.  3. Nodular calcification of anterior leaflet tip. The mitral valve is normal in structure. Mild to moderate mitral valve regurgitation. No evidence of mitral stenosis.  4. Tricuspid valve regurgitation is moderate.  5. The aortic valve is tricuspid. Aortic valve regurgitation is trivial. Mild to moderate aortic valve sclerosis/calcification is present, without any evidence of aortic stenosis.  6. The inferior vena cava is normal in size with greater than 50% respiratory variability, suggesting right atrial pressure of 3 mmHg. FINDINGS  Left Ventricle: Left ventricular ejection fraction, by estimation, is 60 to 65%. The left ventricle has normal function. The left ventricle has no regional wall motion abnormalities. The left ventricular internal cavity size was normal in size. There is  moderate left ventricular hypertrophy. Left ventricular diastolic parameters are consistent with Grade I diastolic dysfunction (impaired  relaxation). Right Ventricle: The right ventricular size is normal. No increase in right ventricular wall thickness. Right ventricular systolic function is normal. Left Atrium: Left atrial size was normal in size. Right Atrium: Right atrial size was normal in size. Pericardium: There is no evidence of pericardial effusion. Mitral Valve: Nodular calcification of anterior leaflet tip. The mitral valve is normal in structure. There is moderate thickening of the mitral valve leaflet(s). There is moderate calcification of the mitral valve leaflet(s). Normal mobility of the mitral valve leaflets. Mild to moderate mitral valve regurgitation. No evidence of mitral valve stenosis. Tricuspid Valve: The tricuspid valve is normal in structure. Tricuspid valve regurgitation is moderate . No evidence of tricuspid stenosis. Aortic Valve: The aortic valve is tricuspid. Aortic valve regurgitation is trivial. Mild to moderate aortic valve sclerosis/calcification is present, without any evidence of aortic stenosis. Pulmonic Valve: The pulmonic valve was normal in structure. Pulmonic valve regurgitation is mild. No evidence of pulmonic stenosis. Aorta: The aortic root is normal in size and structure. Venous: The inferior vena cava is normal in size with greater than 50% respiratory variability, suggesting right atrial pressure of 3 mmHg. IAS/Shunts: No atrial level shunt detected by color flow Doppler.  LEFT VENTRICLE PLAX 2D LVIDd:         4.50 cm     Diastology LVIDs:         2.40 cm     LV e' lateral:   7.62 cm/s LV PW:         1.40 cm     LV E/e' lateral: 10.6 LV IVS:        1.40 cm     LV e' medial:    6.96 cm/s LVOT diam:     2.00 cm     LV E/e' medial:  11.6 LV SV:         67 LV SV Index:   34 LVOT Area:     3.14 cm  LV Volumes (MOD) LV vol d, MOD A4C: 72.3 ml LV vol s, MOD A4C: 23.3 ml LV SV MOD A4C:     72.3 ml RIGHT VENTRICLE RV Basal diam:  4.90 cm RV Mid diam:    3.80 cm LEFT ATRIUM  Index       RIGHT ATRIUM            Index LA diam:        3.30 cm 1.68 cm/m  RA Area:     18.10 cm LA Vol (A2C):   85.2 ml 43.29 ml/m RA Volume:   56.80 ml  28.86 ml/m LA Vol (A4C):   39.7 ml 20.17 ml/m LA Biplane Vol: 62.0 ml 31.50 ml/m  AORTIC VALVE LVOT Vmax:   106.00 cm/s LVOT Vmean:  72.600 cm/s LVOT VTI:    0.214 m  AORTA Ao Root diam: 3.30 cm Ao Asc diam:  3.70 cm MITRAL VALVE MV Area (PHT): 4.63 cm     SHUNTS MV Decel Time: 164 msec     Systemic VTI:  0.21 m MV E velocity: 80.60 cm/s   Systemic Diam: 2.00 cm MV A velocity: 105.00 cm/s MV E/A ratio:  0.77 Jenkins Rouge MD Electronically signed by Jenkins Rouge MD Signature Date/Time: 01/22/2020/10:14:38 AM    Final         Scheduled Meds:  enoxaparin (LOVENOX) injection  40 mg Subcutaneous Q24H   sodium chloride flush  3 mL Intravenous Q12H   Continuous Infusions:   LOS: 0 days    Time spent: 38 minutes spent on chart review, discussion with nursing staff, consultants, updating family and interview/physical exam; more than 50% of that time was spent in counseling and/or coordination of care.    Ming Kunka J [place] DO [facility] Available via Epic secure chat 7am-7pm After these hours, please refer to coverage provider listed on [email or web address] 01/22/2020, 12:09 PM

## 2020-01-23 DIAGNOSIS — Z91040 Latex allergy status: Secondary | ICD-10-CM | POA: Diagnosis not present

## 2020-01-23 DIAGNOSIS — E43 Unspecified severe protein-calorie malnutrition: Secondary | ICD-10-CM | POA: Diagnosis present

## 2020-01-23 DIAGNOSIS — G8929 Other chronic pain: Secondary | ICD-10-CM | POA: Diagnosis present

## 2020-01-23 DIAGNOSIS — F0391 Unspecified dementia with behavioral disturbance: Secondary | ICD-10-CM | POA: Diagnosis present

## 2020-01-23 DIAGNOSIS — E11649 Type 2 diabetes mellitus with hypoglycemia without coma: Secondary | ICD-10-CM | POA: Diagnosis present

## 2020-01-23 DIAGNOSIS — Z79899 Other long term (current) drug therapy: Secondary | ICD-10-CM | POA: Diagnosis not present

## 2020-01-23 DIAGNOSIS — Z87891 Personal history of nicotine dependence: Secondary | ICD-10-CM | POA: Diagnosis not present

## 2020-01-23 DIAGNOSIS — Z888 Allergy status to other drugs, medicaments and biological substances status: Secondary | ICD-10-CM | POA: Diagnosis not present

## 2020-01-23 DIAGNOSIS — D649 Anemia, unspecified: Secondary | ICD-10-CM | POA: Diagnosis present

## 2020-01-23 DIAGNOSIS — R600 Localized edema: Secondary | ICD-10-CM | POA: Diagnosis present

## 2020-01-23 DIAGNOSIS — Z6829 Body mass index (BMI) 29.0-29.9, adult: Secondary | ICD-10-CM | POA: Diagnosis not present

## 2020-01-23 DIAGNOSIS — I10 Essential (primary) hypertension: Secondary | ICD-10-CM | POA: Diagnosis present

## 2020-01-23 DIAGNOSIS — Z20822 Contact with and (suspected) exposure to covid-19: Secondary | ICD-10-CM | POA: Diagnosis present

## 2020-01-23 DIAGNOSIS — Z886 Allergy status to analgesic agent status: Secondary | ICD-10-CM | POA: Diagnosis not present

## 2020-01-23 DIAGNOSIS — M1990 Unspecified osteoarthritis, unspecified site: Secondary | ICD-10-CM | POA: Diagnosis present

## 2020-01-23 DIAGNOSIS — R55 Syncope and collapse: Secondary | ICD-10-CM | POA: Diagnosis present

## 2020-01-23 DIAGNOSIS — Z880 Allergy status to penicillin: Secondary | ICD-10-CM | POA: Diagnosis not present

## 2020-01-23 DIAGNOSIS — E11620 Type 2 diabetes mellitus with diabetic dermatitis: Secondary | ICD-10-CM | POA: Diagnosis not present

## 2020-01-23 DIAGNOSIS — R531 Weakness: Secondary | ICD-10-CM | POA: Diagnosis not present

## 2020-01-23 DIAGNOSIS — Z8249 Family history of ischemic heart disease and other diseases of the circulatory system: Secondary | ICD-10-CM | POA: Diagnosis not present

## 2020-01-23 DIAGNOSIS — Z91018 Allergy to other foods: Secondary | ICD-10-CM | POA: Diagnosis not present

## 2020-01-23 LAB — GLUCOSE, CAPILLARY
Glucose-Capillary: 58 mg/dL — ABNORMAL LOW (ref 70–99)
Glucose-Capillary: 103 mg/dL — ABNORMAL HIGH (ref 70–99)
Glucose-Capillary: 83 mg/dL (ref 70–99)
Glucose-Capillary: 102 mg/dL — ABNORMAL HIGH (ref 70–99)
Glucose-Capillary: 147 mg/dL — ABNORMAL HIGH (ref 70–99)

## 2020-01-23 LAB — URINE CULTURE: Culture: NO GROWTH

## 2020-01-23 NOTE — TOC Progression Note (Signed)
Transition of Care [facility]) - Progression Note    Patient Details  Name: [name] MRN: [id number] Date of Birth: January 04, 1946  [facility] CM/SW [place], [place] Phone Number: 01/23/2020, 2:16 PM  Clinical Narrative:    CSW consulted by MD for SNF placement. CSW contacted patient's legal guardian and daughter [name] to discuss discharge plan. Daughter expressed she is in agreement with recommendation for SNF placement. She expressed patient has been to [facility] in the past and she does not want her to return there. Daughter provided permission for patient to be faxed out to [facility]  TOC team will continue to follow and assist with discharge planning needs.    Expected Discharge Plan: Skilled Nursing Facility Barriers to Discharge: Ship broker, Continued Medical Work up  Expected Discharge Plan and Services Expected Discharge Plan: [place] arrangements for the past 2 months: Single Family Home                                       Social Determinants of Health (SDOH) Interventions    Readmission Risk Interventions No flowsheet data found.

## 2020-01-23 NOTE — Progress Notes (Addendum)
PROGRESS NOTE    [name]  [id number] DOB: 10-13-1945 DOA: 01/21/2020 PCP: Forrest Moron, MD    Brief Narrative:  [name] is a 74 year old female with past medical history remarkable for dementia who presented to the ED following a syncopal episode at home during a bath.  Patient was reportedly complaining of some shortness of breath while getting in the bath and had a short moment of loss of consciousness; upon awaking, she was less responsive than her normal baseline.  Patient with previous history of hypertension and diabetes no longer on any outpatient treatment.  ED, afebrile without leukocytosis.  Hemoglobin 10.3, sodium 140, potassium 3.7, creatinine 0.65, procalcitonin less than 0.10, troponin V, 7.  Urinalysis unrevealing.  Covid-19 PCR negative.  Chest x-ray with left lower base haziness consistent with atelectasis versus early infection.   Assessment & Plan:   Principal Problem:   Syncope Active Problems:   Type 2 diabetes mellitus, controlled ([place])   Dementia with behavioral problem ([place])  Syncope Patient presented following syncopal episode while having a bed bath.  Daughter denies any shaking moments, loss of bowel or bladder or tongue biting during the event.  Daughter reports that mother has been complaining of some leg pain, uncertain laterality.  Patient is afebrile without leukocytosis.  Urinalysis, checks x-ray unrevealing.  TTE with preserved EF, no LV wall motion abnormalities, grade 1 diastolic dysfunction, normal IVC, normal RV function no aortic stenosis. --Suspect etiology vasovagal while taking a bath --Continue to monitor on telemetry --Continue to await PT/OT evaluation  Right lower extremity edema with elevated D-dimer Right lower extremity slightly asymmetric versus left.  D-dimer elevated 1.66. Vascular duplex ultrasound bilateral lower extremities negative for DVT.  History of type 2 diabetes mellitus Diet  controlled.  Hemoglobin A1c 5.2 01/01/2020.  Patient with mild hypoglycemic event this morning, MI: Improved with orange juice. --Liberalize diet to regular today --Continue monitor CBGs before every meal/at bedtime --Discontinue insulin sliding scale  History of essential hypertension Currently not on any home antihypertensive regimen.  BP 124/58.  --Continue to monitor blood pressure closely  Microcytic anemia Hemoglobin 10.3, MCV 75.7.  Iron level 74, TIBC 186, ferritin 137, B12 204. --Stable  Dementia --Continue supportive care  Weakness, debility: Daughter reports ambulatory with assistance at baseline. --Awaiting PT/OT evaluation  DVT prophylaxis: Lovenox Code Status: Full code Family Communication: Updated patient's daughter, [name] via telephone this afternoon  Disposition Plan:  Status is: Observation  The patient remains OBS appropriate and will d/c before 2 midnights. Barriers to discharge include still awaiting PT/OT evaluation  Dispo: The patient is from: Home              Anticipated d/c is to: Home              Anticipated d/c date is: 1 day              Patient currently is not medically stable to d/c.  Patient is currently unsafe discharge as she was previously ambulatory with minimal assistance per daughter's report.  Suspect worsening dementia with associated progressive weakness.  Consultants:   None  Procedures:  TTE IMPRESSIONS    1. Left ventricular ejection fraction, by estimation, is 60 to 65%. The  left ventricle has normal function. The left ventricle has no regional  wall motion abnormalities. There is moderate left ventricular hypertrophy.  Left ventricular diastolic  parameters are consistent with Grade I diastolic dysfunction (impaired  relaxation).  2. Right ventricular systolic function  is normal. The right ventricular  size is normal.  3. Nodular calcification of anterior leaflet tip. The mitral valve is  normal in structure.  Mild to moderate mitral valve regurgitation. No  evidence of mitral stenosis.  4. Tricuspid valve regurgitation is moderate.  5. The aortic valve is tricuspid. Aortic valve regurgitation is trivial.  Mild to moderate aortic valve sclerosis/calcification is present, without  any evidence of aortic stenosis.  6. The inferior vena cava is normal in size with greater than 50%  respiratory variability, suggesting right atrial pressure of 3 mmHg.   Vascular duplex venous ultrasound bilateral lower extremities: Pending  Antimicrobials:   None   Subjective: Patient seen and examined bedside, resting comfortably.  Pleasantly confused.  No family present.  No specific complaints at this time.  Denies headache, no chest pain, no palpitations, no current shortness of breath, no nausea cefonicid/diarrhea, no fever/chills/night sweats, no abdominal pain.  No acute events overnight per nursing staff.  Objective: Vitals:   01/22/20 1535 01/22/20 2125 01/23/20 0528 01/23/20 1000  BP: (!) 151/78 (!) 141/97 126/80 128/82  Pulse: (!) 101 (!) 109 91 88  Resp: 18 15 16 18   Temp: 98.6 F (37 C) 98.2 F (36.8 C) 98.2 F (36.8 C) 98.4 F (36.9 C)  TempSrc: Oral   Oral  SpO2: 100% 100% 97% 98%  Weight: 86.8 kg 86.8 kg    Height:        Intake/Output Summary (Last 24 hours) at 01/23/2020 1122 Last data filed at 01/23/2020 0900 Gross per 24 hour  Intake 240 ml  Output 600 ml  Net -360 ml   Filed Weights   01/21/20 2029 01/22/20 1535 01/22/20 2125  Weight: 84 kg 86.8 kg 86.8 kg    Examination:  General exam: Appears calm and comfortable, pleasantly confused Respiratory system: Clear to auscultation. Respiratory effort normal.  Oxygenating well on room air Cardiovascular system: S1 & S2 heard, RRR. No JVD, murmurs, rubs, gallops or clicks.  Right lower extremity asymmetric in terms of edema versus left Gastrointestinal system: Abdomen is nondistended, soft and nontender. No organomegaly or  masses felt. Normal bowel sounds heard. Central nervous system: Alert.  Not oriented to person/place/time/situation, no focal neurological deficits. Extremities: Symmetric 5 x 5 power. Skin: No rashes, lesions or ulcers Psychiatry: Judgement and insight appear poor. Mood & affect appropriate.     Data Reviewed: I have personally reviewed following labs and imaging studies  CBC: Recent Labs  Lab 01/21/20 2136 01/22/20 0210  WBC 5.9 5.8  HGB 11.5* 10.3*  HCT 39.3 34.8*  MCV 76.2* 75.7*  PLT 190 [id number]   Basic Metabolic Panel: Recent Labs  Lab 01/21/20 2136 01/22/20 0210  NA 135 140  K 4.0 3.7  CL 101 106  CO2 22 29  GLUCOSE 78 85  BUN 12 10  CREATININE 0.59 0.65  CALCIUM 8.7* 8.9   GFR: Estimated Creatinine Clearance: 68.4 mL/min (by C-G formula based on SCr of 0.65 mg/dL). Liver Function Tests: No results for input(s): AST, ALT, ALKPHOS, BILITOT, PROT, ALBUMIN in the last 168 hours. No results for input(s): LIPASE, AMYLASE in the last 168 hours. No results for input(s): AMMONIA in the last 168 hours. Coagulation Profile: No results for input(s): INR, PROTIME in the last 168 hours. Cardiac Enzymes: No results for input(s): CKTOTAL, CKMB, CKMBINDEX, TROPONINI in the last 168 hours. BNP (last 3 results) No results for input(s): PROBNP in the last 8760 hours. HbA1C: No results for input(s): HGBA1C in the last  72 hours. CBG: Recent Labs  Lab 01/22/20 1600 01/22/20 1646 01/22/20 2125 01/23/20 0653 01/23/20 0733  GLUCAP 51* 74 153* 58* 103*   Lipid Profile: No results for input(s): CHOL, HDL, LDLCALC, TRIG, CHOLHDL, LDLDIRECT in the last 72 hours. Thyroid Function Tests: No results for input(s): TSH, T4TOTAL, FREET4, T3FREE, THYROIDAB in the last 72 hours. Anemia Panel: Recent Labs    01/22/20 0920  VITAMINB12 204  FOLATE 8.8  FERRITIN 137  TIBC 186*  IRON 74   Sepsis Labs: Recent Labs  Lab 01/22/20 0116  PROCALCITON <0.10    Recent Results (from  the past 240 hour(s))  Urine culture     Status: None   Collection Time: 01/21/20 10:11 PM   Specimen: Urine, Catheterized  Result Value Ref Range Status   Specimen Description URINE, CATHETERIZED  Final   Special Requests NONE  Final   Culture   Final    NO GROWTH Performed at [facility], [address], [place], [place] [address]    Report Status 01/23/2020 FINAL  Final  SARS Coronavirus 2 by RT PCR (hospital order, performed in [facility] hospital lab) Nasopharyngeal Nasopharyngeal Swab     Status: None   Collection Time: 01/22/20  7:42 AM   Specimen: Nasopharyngeal Swab  Result Value Ref Range Status   SARS Coronavirus 2 NEGATIVE NEGATIVE Final    Comment: (NOTE) SARS-CoV-2 target nucleic acids are NOT DETECTED. The SARS-CoV-2 RNA is generally detectable in upper and lower respiratory specimens during the acute phase of infection. The lowest concentration of SARS-CoV-2 viral copies this assay can detect is 250 copies / mL. A negative result does not preclude SARS-CoV-2 infection and should not be used as the sole basis for treatment or other patient management decisions.  A negative result may occur with improper specimen collection / handling, submission of specimen other than nasopharyngeal swab, presence of viral mutation(s) within the areas targeted by this assay, and inadequate number of viral copies (<250 copies / mL). A negative result must be combined with clinical observations, patient history, and epidemiological information. Fact Sheet for Patients:   StrictlyIdeas.no Fact Sheet for Healthcare Providers: BankingDealers.co.za This test is not yet approved or cleared  by the [place] FDA and has been authorized for detection and/or diagnosis of SARS-CoV-2 by FDA under an Emergency Use Authorization (EUA).  This EUA will remain in effect (meaning this test can be used) for the duration of the COVID-19  declaration under Section 564(b)(1) of the Act, 21 U.S.C. section 360bbb-3(b)(1), unless the authorization is terminated or revoked sooner. Performed at [facility], [place] [address] [place], [place] [address]   MRSA PCR Screening     Status: None   Collection Time: 01/22/20  6:43 PM   Specimen: Nasal Mucosa; Nasopharyngeal  Result Value Ref Range Status   MRSA by PCR NEGATIVE NEGATIVE Final    Comment:        The GeneXpert MRSA Assay (FDA approved for NASAL specimens only), is one component of a comprehensive MRSA colonization surveillance program. It is not intended to diagnose MRSA infection nor to guide or monitor treatment for MRSA infections. Performed at [facility], [place] [address], [place], [place] [address]          Radiology Studies: DG Chest Portable 1 View  Result Date: 01/21/2020 CLINICAL DATA:  Shortness of breath EXAM: PORTABLE CHEST 1 VIEW COMPARISON:  September 18, 2017 FINDINGS: The heart size and mediastinal contours are within  normal limits. Aortic knob calcifications. Hazy rounded patchy airspace opacity seen at the left lung base. The right lung is clear. No acute osseous abnormality. IMPRESSION: Hazy airspace opacity at the left lung base which may be due to atelectasis and/or early infectious etiology. Electronically Signed   By: Prudencio Pair M.D.   On: 01/21/2020 22:34   ECHOCARDIOGRAM COMPLETE  Result Date: 01/22/2020    ECHOCARDIOGRAM REPORT   Patient Name:   TULLY BOAKYE Date of Exam: 01/22/2020 Medical Rec #:  [id number]         Height:       67.5 in Accession #:    [id number]        Weight:       185.2 lb Date of Birth:  28-Apr-1946         BSA:          1.968 m Patient Age:    36 years          BP:           134/58 mmHg Patient Gender: F                 HR:           78 bpm. Exam Location:  Inpatient Procedure: 2D Echo, Color Doppler and Cardiac Doppler Indications:    R55 Syncope  History:        Patient has prior history of  Echocardiogram examinations, most                 recent 02/16/2018.  Sonographer:    Merrie Roof RDCS Referring Phys: [place]  1. Left ventricular ejection fraction, by estimation, is 60 to 65%. The left ventricle has normal function. The left ventricle has no regional wall motion abnormalities. There is moderate left ventricular hypertrophy. Left ventricular diastolic parameters are consistent with Grade I diastolic dysfunction (impaired relaxation).  2. Right ventricular systolic function is normal. The right ventricular size is normal.  3. Nodular calcification of anterior leaflet tip. The mitral valve is normal in structure. Mild to moderate mitral valve regurgitation. No evidence of mitral stenosis.  4. Tricuspid valve regurgitation is moderate.  5. The aortic valve is tricuspid. Aortic valve regurgitation is trivial. Mild to moderate aortic valve sclerosis/calcification is present, without any evidence of aortic stenosis.  6. The inferior vena cava is normal in size with greater than 50% respiratory variability, suggesting right atrial pressure of 3 mmHg. FINDINGS  Left Ventricle: Left ventricular ejection fraction, by estimation, is 60 to 65%. The left ventricle has normal function. The left ventricle has no regional wall motion abnormalities. The left ventricular internal cavity size was normal in size. There is  moderate left ventricular hypertrophy. Left ventricular diastolic parameters are consistent with Grade I diastolic dysfunction (impaired relaxation). Right Ventricle: The right ventricular size is normal. No increase in right ventricular wall thickness. Right ventricular systolic function is normal. Left Atrium: Left atrial size was normal in size. Right Atrium: Right atrial size was normal in size. Pericardium: There is no evidence of pericardial effusion. Mitral Valve: Nodular calcification of anterior leaflet tip. The mitral valve is normal in structure. There is moderate  thickening of the mitral valve leaflet(s). There is moderate calcification of the mitral valve leaflet(s). Normal mobility of the mitral valve leaflets. Mild to moderate mitral valve regurgitation. No evidence of mitral valve stenosis. Tricuspid Valve: The tricuspid valve is normal in structure. Tricuspid valve regurgitation is moderate . No  evidence of tricuspid stenosis. Aortic Valve: The aortic valve is tricuspid. Aortic valve regurgitation is trivial. Mild to moderate aortic valve sclerosis/calcification is present, without any evidence of aortic stenosis. Pulmonic Valve: The pulmonic valve was normal in structure. Pulmonic valve regurgitation is mild. No evidence of pulmonic stenosis. Aorta: The aortic root is normal in size and structure. Venous: The inferior vena cava is normal in size with greater than 50% respiratory variability, suggesting right atrial pressure of 3 mmHg. IAS/Shunts: No atrial level shunt detected by color flow Doppler.  LEFT VENTRICLE PLAX 2D LVIDd:         4.50 cm     Diastology LVIDs:         2.40 cm     LV e' lateral:   7.62 cm/s LV PW:         1.40 cm     LV E/e' lateral: 10.6 LV IVS:        1.40 cm     LV e' medial:    6.96 cm/s LVOT diam:     2.00 cm     LV E/e' medial:  11.6 LV SV:         67 LV SV Index:   34 LVOT Area:     3.14 cm  LV Volumes (MOD) LV vol d, MOD A4C: 72.3 ml LV vol s, MOD A4C: 23.3 ml LV SV MOD A4C:     72.3 ml RIGHT VENTRICLE RV Basal diam:  4.90 cm RV Mid diam:    3.80 cm LEFT ATRIUM             Index       RIGHT ATRIUM           Index LA diam:        3.30 cm 1.68 cm/m  RA Area:     18.10 cm LA Vol (A2C):   85.2 ml 43.29 ml/m RA Volume:   56.80 ml  28.86 ml/m LA Vol (A4C):   39.7 ml 20.17 ml/m LA Biplane Vol: 62.0 ml 31.50 ml/m  AORTIC VALVE LVOT Vmax:   106.00 cm/s LVOT Vmean:  72.600 cm/s LVOT VTI:    0.214 m  AORTA Ao Root diam: 3.30 cm Ao Asc diam:  3.70 cm MITRAL VALVE MV Area (PHT): 4.63 cm     SHUNTS MV Decel Time: 164 msec     Systemic VTI:   0.21 m MV E velocity: 80.60 cm/s   Systemic Diam: 2.00 cm MV A velocity: 105.00 cm/s MV E/A ratio:  0.77 Jenkins Rouge MD Electronically signed by Jenkins Rouge MD Signature Date/Time: 01/22/2020/10:14:38 AM    Final    VAS [place] LOWER EXTREMITY VENOUS (DVT)  Result Date: 01/23/2020  Lower Venous DVTStudy Indications: Edema. Other Indications: Syncope. Limitations: Positioning and woody edema. Comparison Study: Prior study from 02/16/18 is available for comparison Performing Technologist: Sharion Dove RVS  Examination Guidelines: A complete evaluation includes B-mode imaging, spectral Doppler, color Doppler, and power Doppler as needed of all accessible portions of each vessel. Bilateral testing is considered an integral part of a complete examination. Limited examinations for reoccurring indications may be performed as noted. The reflux portion of the exam is performed with the patient in reverse Trendelenburg.  +---------+---------------+---------+-----------+----------+-------------------+ RIGHT    CompressibilityPhasicitySpontaneityPropertiesThrombus Aging      +---------+---------------+---------+-----------+----------+-------------------+ CFV      Full           Yes      Yes                                      +---------+---------------+---------+-----------+----------+-------------------+  SFJ      Full                                                             +---------+---------------+---------+-----------+----------+-------------------+ FV Prox  Full                                                             +---------+---------------+---------+-----------+----------+-------------------+ FV Mid                                                Not visualized      +---------+---------------+---------+-----------+----------+-------------------+ FV Distal                                             Not visualized       +---------+---------------+---------+-----------+----------+-------------------+ PFV                                                   Not visualized      +---------+---------------+---------+-----------+----------+-------------------+ POP                     Yes      Yes                  patent by color and                                                       Doppler             +---------+---------------+---------+-----------+----------+-------------------+ PTV                                                   Not visualized      +---------+---------------+---------+-----------+----------+-------------------+ PERO                                                  Not visualized      +---------+---------------+---------+-----------+----------+-------------------+   +---------+---------------+---------+-----------+----------+--------------+ LEFT     CompressibilityPhasicitySpontaneityPropertiesThrombus Aging +---------+---------------+---------+-----------+----------+--------------+ CFV      Full           Yes      Yes                                 +---------+---------------+---------+-----------+----------+--------------+  SFJ      Full                                                        +---------+---------------+---------+-----------+----------+--------------+ FV Prox  Full           Yes      Yes                                 +---------+---------------+---------+-----------+----------+--------------+ FV Mid   Full           Yes      Yes                                 +---------+---------------+---------+-----------+----------+--------------+ FV DistalFull           Yes      Yes                                 +---------+---------------+---------+-----------+----------+--------------+ PFV      Full                                                         +---------+---------------+---------+-----------+----------+--------------+ POP      Full                                                        +---------+---------------+---------+-----------+----------+--------------+ PTV                                                   Not visualized +---------+---------------+---------+-----------+----------+--------------+ PERO                                                  Not visualized +---------+---------------+---------+-----------+----------+--------------+     Summary: RIGHT: - There is no evidence of deep vein thrombosis in the lower extremity. However, portions of this examination were limited- see technologist comments above.  LEFT: - Findings appear essentially unchanged compared to previous examination. - There is no evidence of deep vein thrombosis in the lower extremity. However, portions of this examination were limited- see technologist comments above.  *See table(s) above for measurements and observations. Electronically signed by Ruta Hinds MD on 01/23/2020 at 8:50:47 AM.    Final         Scheduled Meds: . enoxaparin (LOVENOX) injection  40 mg Subcutaneous Q24H  . sodium chloride flush  3 mL Intravenous Q12H   Continuous Infusions:   LOS: 0 days    Time spent: 34 minutes spent on chart review, discussion with nursing staff, consultants,  updating family and interview/physical exam; more than 50% of that time was spent in counseling and/or coordination of care.    Fatimah Sundquist J [place] DO [facility] Available via Epic secure chat 7am-7pm After these hours, please refer to coverage provider listed on [email or web address] 01/23/2020, 11:22 AM

## 2020-01-23 NOTE — Evaluation (Signed)
Occupational Therapy Evaluation Patient Details Name: [name] MRN: [id number] DOB: 1946/06/10 Today's Date: 01/23/2020    History of Present Illness Hinda Kostiuk is a 74 year old female with past medical history remarkable for dementia who presented to the ED following a syncopal episode at home during a bath. PMH includes DM and HTN.    Clinical Impression   No family/caregiver present to provide pt's prior level of functioning. Pt with hx of dementia, unable to provide reliable information. Pt currently follows simple 1 step commands <10% of the time. She was able to drink from a water bottle after therapist opened it. Pt required maxA-totalA for bed mobility. Pt with physical and cognitive limitations impacting safety and independence with ADL/IADL and functional mobility. Pt will continue to benefit from skilled OT services to maximize safety and independence with ADL/IADL and functional mobility. Will continue to follow acutely and progress as tolerated. Currently recommend d/c to SNF.     Follow Up Recommendations  SNF;Supervision/Assistance - 24 hour    Equipment Recommendations  3 in 1 bedside commode;Hospital bed(hoyer lift)    Recommendations for Other Services       Precautions / Restrictions Precautions Precautions: Fall Restrictions Weight Bearing Restrictions: No      Mobility Bed Mobility Overal bed mobility: Needs Assistance Bed Mobility: Supine to Sit;Sit to Supine     Supine to sit: Max assist;HOB elevated Sit to supine: Total assist   General bed mobility comments: pt assisting with progressing BLE to EOB;required maxA to progress trunk to upright posture and continue to progress hips to EOB  Transfers                 General transfer comment: deferred    Balance Overall balance assessment: Needs assistance Sitting-balance support: Bilateral upper extremity supported;Feet unsupported Sitting balance-Leahy Scale:  Zero Sitting balance - Comments: maxA-modA for sitting balance, due to position of trunk, pt unable to get feet on ground Postural control: Posterior lean                                 ADL either performed or assessed with clinical judgement   ADL Overall ADL's : Needs assistance/impaired Eating/Feeding: Minimal assistance;Sitting   Grooming: Maximal assistance   Upper Body Bathing: Maximal assistance   Lower Body Bathing: Total assistance   Upper Body Dressing : Maximal assistance   Lower Body Dressing: Total assistance   Toilet Transfer: Total assistance Toilet Transfer Details (indicate cue type and reason): deferred         Functional mobility during ADLs: Maximal assistance;+2 for physical assistance;+2 for safety/equipment General ADL Comments: pt limited to sitting EOB, required BUE support and maxA from therapist to remain upright posture, pt with posterior lean     Vision Baseline Vision/History: Wears glasses       Perception     Praxis      Pertinent Vitals/Pain Pain Assessment: Faces Faces Pain Scale: Hurts whole lot Pain Location: generalized with movement Pain Descriptors / Indicators: Grimacing;Guarding;Moaning Pain Intervention(s): Limited activity within patient's tolerance;Monitored during session;Repositioned     Hand Dominance Right   Extremity/Trunk Assessment Upper Extremity Assessment Upper Extremity Assessment: Generalized weakness   Lower Extremity Assessment Lower Extremity Assessment: Defer to PT evaluation;RLE deficits/detail RLE Deficits / Details: knee extension contracture;hips posterior pelvic tilt contracture;poor skin integrity feet to mid calf;pt reports she has hx of peripheral neuropathy RLE Sensation: decreased light touch;history of peripheral  neuropathy RLE Coordination: decreased fine motor;decreased gross motor   Cervical / Trunk Assessment Cervical / Trunk Assessment: Kyphotic;Other  exceptions Cervical / Trunk Exceptions: hips in post.pelvic tilt contracute;trunk forward flexion contracture   Communication Communication Communication: No difficulties   Cognition Arousal/Alertness: Awake/alert Behavior During Therapy: WFL for tasks assessed/performed Overall Cognitive Status: No family/caregiver present to determine baseline cognitive functioning                                 General Comments: pt with dementia at baseline;pt following simple one step commands less than 10% of the time;pt perseverating on adjusting the blankets and her gown;pt not oriented to place, oriented to person   General Comments  HR 122 sitting EOB, RN aware    Exercises     Shoulder Instructions      Home Living Family/patient expects to be discharged to:: Private residence Living Arrangements: Children Available Help at Discharge: Family Type of Home: House Home Access: Stairs to enter Technical brewer of Steps: 8 Entrance Stairs-Rails: Right;Left Home Layout: Two level;Bed/bath upstairs;1/2 bath on main level Alternate Level Stairs-Number of Steps: flight Alternate Level Stairs-Rails: Right;Left Bathroom Shower/Tub: Retail buyer Accessibility: Yes       Additional Comments: pt is a poor historian, information per chart      Prior Functioning/Environment Level of Independence: Needs assistance  Gait / Transfers Assistance Needed: per RN, pt was w/c at baseline, required 3 people to get her in/out of house     Comments: pt is a poor historian, no family available to provide information        OT Problem List: Decreased strength;Decreased range of motion;Decreased activity tolerance;Impaired balance (sitting and/or standing);Decreased safety awareness;Decreased knowledge of use of DME or AE;Cardiopulmonary status limiting activity;Decreased cognition;Impaired sensation;Pain      OT Treatment/Interventions:  Self-care/ADL training;Therapeutic exercise;DME and/or AE instruction;Therapeutic activities;Cognitive remediation/compensation;Patient/family education;Balance training    OT Goals(Current goals can be found in the care plan section) Acute Rehab OT Goals Patient Stated Goal: pt did not state OT Goal Formulation: With patient Time For Goal Achievement: 02/06/20 Potential to Achieve Goals: Fair ADL Goals Pt Will Perform Eating: with supervision;sitting Pt Will Perform Grooming: with set-up;sitting  OT Frequency: Min 2X/week   Barriers to D/C: Decreased caregiver support;Inaccessible home environment  unsure level of assistance pt has at home       Co-evaluation              AM-PAC OT "6 Clicks" Daily Activity     Outcome Measure Help from another person eating meals?: A Lot Help from another person taking care of personal grooming?: A Lot Help from another person toileting, which includes using toliet, bedpan, or urinal?: Total Help from another person bathing (including washing, rinsing, drying)?: A Lot Help from another person to put on and taking off regular upper body clothing?: A Lot Help from another person to put on and taking off regular lower body clothing?: Total 6 Click Score: 10   End of Session Nurse Communication: Mobility status(HR)  Activity Tolerance: Patient tolerated treatment well;Patient limited by pain Patient left: in bed;with call bell/phone within reach;with bed alarm set  OT Visit Diagnosis: Unsteadiness on feet (R26.81);Other abnormalities of gait and mobility (R26.89);Muscle weakness (generalized) (M62.81);Other symptoms and signs involving cognitive function;Pain Pain - part of body: (generalized)  Time: 1024-1040 OT Time Calculation (min): 16 min Charges:  OT General Charges $OT Visit: 1 Visit OT Evaluation $OT Eval Moderate Complexity: 1 Mod  Deneene Tarver OTR/L Acute Rehabilitation Services Office: [place] 01/23/2020, 11:54 AM

## 2020-01-23 NOTE — Progress Notes (Signed)
CBG=58 @ [id number]. Pt. Asymptomatic. 120cc juice given. Will recheck CBG.

## 2020-01-23 NOTE — Evaluation (Signed)
Physical Therapy Evaluation Patient Details Name: [name] MRN: [id number] DOB: 11-26-1945 Today's Date: 01/23/2020   History of Present Illness  [name] is a 74 year old female with past medical history remarkable for dementia who presented to the ED following a syncopal episode at home during a bath.  Clinical Impression  Pt oriented to herself, information collected from prior charting. PTA pt was wheelchair bound and required 3 family members to get her in and out of the car. Pt is currently severely limited in safe mobility by decreased short-term memory and internal distraction in presence of bilateral LE contractures and overall decreased strength. Attempted to have pt move to EoB with assist however lunch arrived and pt could not be redirected to working with therapist on mobility. Pt set up for lunch. PT recommends SNF level rehab at discharge due to pt's decreased strength and safety with mobility. PT will continue to follow acutely.     Follow Up Recommendations SNF    Equipment Recommendations  Other (comment)(TBD at next venue)       Precautions / Restrictions Precautions Precautions: Fall Restrictions Weight Bearing Restrictions: No      Mobility  Bed Mobility Overal bed mobility: Needs Assistance Bed Mobility: Supine to Sit     Supine to sit: Max assist;HOB elevated Sit to supine: Total assist   General bed mobility comments: pt was moving LE across bed to sit EoB when lunch arrived, unable to regain pt focus  Transfers                 General transfer comment: deferred                         Balance Overall balance assessment: Needs assistance Sitting-balance support: Bilateral upper extremity supported;Feet unsupported Sitting balance-Leahy Scale: Zero Sitting balance - Comments: maxA-modA for sitting balance, due to position of trunk, pt unable to get feet on ground Postural control: Posterior lean                                    Pertinent Vitals/Pain Pain Assessment: Faces Faces Pain Scale: Hurts whole lot Pain Location: generalized with movement Pain Descriptors / Indicators: Grimacing;Guarding;Moaning Pain Intervention(s): Limited activity within patient's tolerance;Monitored during session;Repositioned    Home Living Family/patient expects to be discharged to:: Private residence Living Arrangements: Children Available Help at Discharge: Family Type of Home: House Home Access: Stairs to enter Entrance Stairs-Rails: Psychiatric nurse of Steps: 8 Home Layout: Two level;Bed/bath upstairs;1/2 bath on main level   Additional Comments: pt is a poor historian, information per chart    Prior Function Level of Independence: Needs assistance   Gait / Transfers Assistance Needed: per RN, pt was w/c at baseline, required 3 people to get her in/out of house     Comments: pt is a poor historian, no family available to provide information     Hand Dominance   Dominant Hand: Right    Extremity/Trunk Assessment   Upper Extremity Assessment Upper Extremity Assessment: Defer to OT evaluation    Lower Extremity Assessment Lower Extremity Assessment: RLE deficits/detail;LLE deficits/detail RLE Deficits / Details: R LE contracted in full knee extension, pt with increased grimace with attempt to bend, pelvis in posterior tilt contracture limiting hip ROM AB/ADduction > flexion, unable to perform strength assessment RLE Sensation: decreased light touch;history of peripheral neuropathy RLE Coordination: decreased fine motor;decreased  gross motor LLE Deficits / Details: L LE with knee flexion contracture at approximately 120 degrees,AAROM 110-130 degrees with grimace in end range, hip ROM limited by posterior pelvic tilt, AB/ADductin >flexion  LLE Sensation: decreased light touch LLE Coordination: decreased fine motor;decreased gross motor    Cervical /  Trunk Assessment Cervical / Trunk Assessment: Kyphotic;Other exceptions Cervical / Trunk Exceptions: hips in post.pelvic tilt contracute;trunk forward flexion contracture  Communication   Communication: No difficulties  Cognition Arousal/Alertness: Awake/alert Behavior During Therapy: WFL for tasks assessed/performed Overall Cognitive Status: No family/caregiver present to determine baseline cognitive functioning                                 General Comments: pt repeatly remarking to theapist that she had Polio and that it lasted a long time and it is why she can not move well, oriented only to self, internally destracted when trying to follow simple commands      General Comments General comments (skin integrity, edema, etc.): skin dry and darkened from calf down on bilateral LE, VSS on RA    Exercises General Exercises - Lower Extremity Ankle Circles/Pumps: AAROM;Both;5 reps;Supine Heel Slides: AAROM;Left;10 reps;Supine(attempting to increase ROM) Hip ABduction/ADduction: AAROM;Both;5 reps;Supine   Assessment/Plan    PT Assessment Patient needs continued PT services  PT Problem List Decreased strength;Decreased range of motion;Decreased activity tolerance;Decreased balance;Decreased mobility;Decreased coordination;Decreased cognition;Decreased safety awareness;Decreased knowledge of use of DME;Decreased knowledge of precautions;Impaired sensation;Impaired tone;Decreased skin integrity;Pain       PT Treatment Interventions DME instruction;Functional mobility training;Therapeutic activities;Therapeutic exercise;Balance training;Neuromuscular re-education;Cognitive remediation;Patient/family education    PT Goals (Current goals can be found in the Care Plan section)  Acute Rehab PT Goals Patient Stated Goal: pt did not state PT Goal Formulation: Patient unable to participate in goal setting Time For Goal Achievement: 02/06/20    Frequency Min 2X/week   Barriers  to discharge Inaccessible home environment         AM-PAC PT "6 Clicks" Mobility  Outcome Measure Help needed turning from your back to your side while in a flat bed without using bedrails?: A Lot Help needed moving from lying on your back to sitting on the side of a flat bed without using bedrails?: Total Help needed moving to and from a bed to a chair (including a wheelchair)?: Total Help needed standing up from a chair using your arms (e.g., wheelchair or bedside chair)?: Total Help needed to walk in hospital room?: Total Help needed climbing 3-5 steps with a railing? : Total 6 Click Score: 7    End of Session   Activity Tolerance: Patient limited by pain Patient left: in bed;with call bell/phone within reach;with bed alarm set Nurse Communication: Mobility status PT Visit Diagnosis: Other abnormalities of gait and mobility (R26.89);History of falling (Z91.81);Muscle weakness (generalized) (M62.81);Difficulty in walking, not elsewhere classified (R26.2);Pain;Other symptoms and signs involving the nervous system (R29.898) Pain - Right/Left: (bilateral ) Pain - part of body: Leg    Time: [id number] PT Time Calculation (min) (ACUTE ONLY): 16 min   Charges:   PT Evaluation $PT Eval Moderate Complexity: 1 Mod          [name] B. Migdalia Dk PT, DPT Acute Rehabilitation Services Pager [phone] Office [phone]   [place] 01/23/2020, 12:47 PM

## 2020-01-23 NOTE — NC FL2 (Signed)
[place] LEVEL OF CARE SCREENING TOOL     IDENTIFICATION  Patient Name: [name] Birthdate: 29-Apr-1946 Sex: female Admission Date (Current Location): 01/21/2020  [facility] and [place] Number:  Herbalist and Address:  [facility] [place] [address] [place], [place] [address]      Provider Number: [id number]  Attending Physician Name and Address:  [place] Eric J, DO  Relative Name and Phone Number:  [name]    Current Level of Care: Hospital Recommended Level of Care: [place] Prior Approval Number:    Date Approved/Denied:   PASRR Number: [id number]  Discharge Plan: SNF    Current Diagnoses: Patient Active Problem List   Diagnosis Date Noted  . Syncope 01/21/2020  . H/O parotidectomy 07/02/2019  . Dementia with behavioral problem ([place]) 03/09/2018  . Edema 02/16/2018  . Physical deconditioning 04/04/2017  . Eczema 03/24/2017  . Decreased independence with activities of daily living 03/24/2017  . Toenail deformity 03/24/2017  . Decreased mobility and endurance 02/07/2017  . Cellulitis   . [place] scabies 12/18/2016  . Community acquired pneumonia of right lower lobe of lung   . Dehydration   . Failure to thrive in adult   . Impetigo   . Rash of entire body 12/06/2016  . Localized swelling, mass, or lump of lower extremity, bilateral 12/06/2016  . Constipation 03/24/2016  . Fall 02/04/2016  . Chronic anemia 02/04/2016  . Cognitive decline 02/04/2016  . Cellulitis of right arm 01/19/2016  . Swelling of joint, knee, right 10/19/2015  . Venous insufficiency of both lower extremities, chronic 10/19/2015  . Bilateral lower extremity edema 05/24/2015  . Type 2 diabetes mellitus, controlled ([place]) 05/24/2015  . Malnutrition of moderate degree ([place]) 06/07/2014  . Acute pericarditis, unspecified 06/07/2014  . Hypoglycemia 06/06/2014  . Fever 06/06/2014  . Knee pain, chronic  06/06/2014  . Mass of right side of neck 06/06/2014  . Severe protein-calorie malnutrition ([place]) 06/06/2014  . Microcytic anemia 06/06/2014    Orientation RESPIRATION BLADDER Height & Weight     Self  Normal Incontinent, External catheter Weight: 191 lb 5.8 oz (86.8 kg) Height:  5' 7.5" (171.5 cm)  BEHAVIORAL SYMPTOMS/MOOD NEUROLOGICAL BOWEL NUTRITION STATUS      Continent Diet(See discharge summary)  AMBULATORY STATUS COMMUNICATION OF NEEDS Skin   Extensive Assist Verbally Normal                       Personal Care Assistance Level of Assistance  Bathing, Feeding, Dressing Bathing Assistance: Maximum assistance Feeding assistance: Maximum assistance Dressing Assistance: Maximum assistance     Functional Limitations Info  Sight, Speech, Hearing Sight Info: Adequate Hearing Info: Adequate Speech Info: Adequate    SPECIAL CARE FACTORS FREQUENCY  PT (By licensed PT), OT (By licensed OT)     PT Frequency: 5x a week OT Frequency: 5x a week            Contractures Contractures Info: Not present    Additional Factors Info  Code Status, Allergies Code Status Info: Full Allergies Info: Orange Concentrate (Flavoring Agent), Peach (Prunus Persica), Penicillins, Strawberry Extract, Tylenol (Acetaeminophn), Adhesive (Tape), Aspirin, Latex, Other, Pineapple, Strawberry Flavor           Current Medications (01/23/2020):  This is the current hospital active medication list Current Facility-Administered Medications  Medication Dose Route Frequency Provider Last Rate Last Admin  . enoxaparin (LOVENOX) injection 40 mg  40 mg Subcutaneous Q24H  Etta Quill, DO   40 mg at 01/23/20 1058  . ondansetron (ZOFRAN) tablet 4 mg  4 mg Oral Q6H PRN Etta Quill, DO       Or  . ondansetron [facility]) injection 4 mg  4 mg Intravenous Q6H PRN Etta Quill, DO      . sodium chloride flush (NS) 0.9 % injection 3 mL  3 mL Intravenous Q12H Jennette Kettle M, DO   3 mL at 01/22/20  2206     Discharge Medications: Please see discharge summary for a list of discharge medications.  Relevant Imaging Results:  Relevant Lab Results:   Additional Information [id number]  Neysa Hotter [place], [place]

## 2020-01-24 DIAGNOSIS — E43 Unspecified severe protein-calorie malnutrition: Secondary | ICD-10-CM

## 2020-01-24 DIAGNOSIS — R531 Weakness: Secondary | ICD-10-CM

## 2020-01-24 LAB — GLUCOSE, CAPILLARY
Glucose-Capillary: 62 mg/dL — ABNORMAL LOW (ref 70–99)
Glucose-Capillary: 65 mg/dL — ABNORMAL LOW (ref 70–99)
Glucose-Capillary: 98 mg/dL (ref 70–99)
Glucose-Capillary: 124 mg/dL — ABNORMAL HIGH (ref 70–99)
Glucose-Capillary: 167 mg/dL — ABNORMAL HIGH (ref 70–99)
Glucose-Capillary: 119 mg/dL — ABNORMAL HIGH (ref 70–99)

## 2020-01-24 MED ORDER — ADULT MULTIVITAMIN W/MINERALS CH
1.0000 | ORAL_TABLET | Freq: Every day | ORAL | Status: DC
  Administered 2020-01-24 – 2020-01-27 (×4): 1 via ORAL
  Filled 2020-01-24 (×4): qty 1

## 2020-01-24 MED ORDER — ENSURE ENLIVE PO LIQD
237.0000 mL | Freq: Three times a day (TID) | ORAL | Status: DC
  Administered 2020-01-24 – 2020-01-27 (×10): 237 mL via ORAL

## 2020-01-24 NOTE — Progress Notes (Signed)
The chaplain stopped by after receiving a consult for an AD. The patient was sleeping. The chaplain left the AD brochure at the bedside and alerted the nurse. The chaplain is available to follow-up if needed.  Brion Aliment Chaplain Resident For questions concerning this note please contact me by pager [phone]

## 2020-01-24 NOTE — Plan of Care (Signed)
  Problem: Activity: Goal: Risk for activity intolerance will decrease Outcome: Progressing   Problem: Nutrition: Goal: Adequate nutrition will be maintained Outcome: Progressing   

## 2020-01-24 NOTE — Progress Notes (Signed)
CBG @ 0652 is 62. Pt asymptomatic. 4 oz juice given. CBG to be rechecked.

## 2020-01-24 NOTE — Progress Notes (Signed)
Initial Nutrition Assessment  DOCUMENTATION CODES:   Severe malnutrition in context of chronic illness  INTERVENTION:  Ensure Enlive po TID, each supplement provides 350 kcal and 20 grams of protein  MVI daily   NUTRITION DIAGNOSIS:   Severe Malnutrition related to chronic illness(dementia) as evidenced by severe muscle depletion, severe fat depletion.    GOAL:   Patient will meet greater than or equal to 90% of their needs    MONITOR:      REASON FOR ASSESSMENT:   Malnutrition Screening Tool    ASSESSMENT:   Pt with a PMH significant for DM, HTN, and dementia who presented with syncope  Pt appears pleasantly confused at time of RD assessment. Pt difficult to obtain information from as she becomes easily distracted and talking with me as if we had extensive interactions in the past. Pt reports eating salads and sandwiches at home, but unsure of how accurate this information is. Unable to obtain any other relevant history at this time.   PO Intake: 60-90% x 3 recorded meals   Labs and medications reviewed.  CBGs [phone]  NUTRITION - FOCUSED PHYSICAL EXAM:    Most Recent Value  Orbital Region  Moderate depletion  Upper Arm Region  Severe depletion  Thoracic and Lumbar Region  Moderate depletion  Buccal Region  Severe depletion  Temple Region  Severe depletion  Clavicle Bone Region  Severe depletion  Clavicle and Acromion Bone Region  Severe depletion  Scapular Bone Region  Severe depletion  Dorsal Hand  Severe depletion  Patellar Region  No depletion  Anterior Thigh Region  No depletion  Posterior Calf Region  No depletion  Edema (RD Assessment)  Moderate [BLE]  Hair  Reviewed  Eyes  Reviewed  Mouth  Reviewed  Skin  Other (Comment) [dry and cracking]  Nails  Reviewed       Diet Order:   Diet Order            Diet regular Room service appropriate? Yes; Fluid consistency: Thin  Diet effective now              EDUCATION NEEDS:   Not  appropriate for education at this time  Skin:  Skin Assessment: Skin Integrity Issues: Skin Integrity Issues:: DTI, Stage II DTI: R buttocks Stage II: L buttocks  Last BM:  unknown  Height:   Ht Readings from Last 1 Encounters:  01/21/20 5' 7.5" (1.715 m)    Weight:   Wt Readings from Last 5 Encounters:  01/22/20 86.8 kg  10/18/19 83.9 kg  07/02/19 84.2 kg  01/01/19 88.1 kg  07/31/18 90.7 kg    BMI:  Body mass index is 29.53 kg/m.  Estimated Nutritional Needs:   Kcal:  1900-2100  Protein:  95-105 grams  Fluid:  >1.9L/d    Larkin Ina, MS, RD, LDN RD pager number and weekend/on-call pager number located in [place].

## 2020-01-24 NOTE — Progress Notes (Signed)
PROGRESS NOTE    [name]  [id number] DOB: March 26, 1946 DOA: 01/21/2020 PCP: Forrest Moron, MD    Brief Narrative:  [name] is a 74 year old female with past medical history remarkable for dementia who presented to the ED following a syncopal episode at home during a bath.  Patient was reportedly complaining of some shortness of breath while getting in the bath and had a short moment of loss of consciousness; upon awaking, she was less responsive than her normal baseline.  Patient with previous history of hypertension and diabetes no longer on any outpatient treatment.  ED, afebrile without leukocytosis.  Hemoglobin 10.3, sodium 140, potassium 3.7, creatinine 0.65, procalcitonin less than 0.10, troponin V, 7.  Urinalysis unrevealing.  Covid-19 PCR negative.  Chest x-ray with left lower base haziness consistent with atelectasis versus early infection.   Assessment & Plan:   Principal Problem:   Syncope Active Problems:   Type 2 diabetes mellitus, controlled ([place])   Dementia with behavioral problem ([place])  Syncope Patient presented following syncopal episode while having a bed bath.  Daughter denies any shaking moments, loss of bowel or bladder or tongue biting during the event.  Daughter reports that mother has been complaining of some leg pain, uncertain laterality.  Patient is afebrile without leukocytosis.  Urinalysis, checks x-ray unrevealing.  TTE with preserved EF, no LV wall motion abnormalities, grade 1 diastolic dysfunction, normal IVC, normal RV function no aortic stenosis. --Suspect etiology vasovagal while taking a bath --Continue to monitor on telemetry, no arrhythmias noted such far during hospitalization --PT recommends SNF, case management for coordination  Right lower extremity edema with elevated D-dimer Right lower extremity slightly asymmetric versus left.  D-dimer elevated 1.66. Vascular duplex ultrasound bilateral lower extremities  negative for DVT.  History of type 2 diabetes mellitus Diet controlled.  Hemoglobin A1c 5.2 01/01/2020.  Patient with mild hypoglycemic event this morning, MI: Improved with orange juice. --Liberalized diet to regular  --Continue monitor CBGs before every meal/at bedtime --Discontinue insulin sliding scale  History of essential hypertension Currently not on any home antihypertensive regimen.  BP 124/58.  --Continue to monitor blood pressure closely  Microcytic anemia Hemoglobin 10.3, MCV 75.7.  Iron level 74, TIBC 186, ferritin 137, B12 204. --Stable  Dementia --Continue supportive care  Weakness, debility: Daughter reports ambulatory with assistance at baseline. --PT recommends SNF, case management for coordination.  Severe protein calorie malnutrition Body mass index is 29.53 kg/m. Nutrition Status: Nutrition Problem: Severe Malnutrition Etiology: chronic illness(dementia) Signs/Symptoms: severe muscle depletion, severe fat depletion Interventions: Ensure Enlive (each supplement provides 350kcal and 20 grams of protein), MVI     DVT prophylaxis: Lovenox Code Status: Full code Family Communication: Updated patient's daughter, [name] via telephone this afternoon  Disposition Plan:  Status is: Observation  The patient will require care spanning > 2 midnights and should be moved to inpatient because: Ongoing diagnostic testing needed not appropriate for outpatient work up and Unsafe d/c plan Barriers to discharge include still awaiting PT/OT evaluation  Dispo: The patient is from: Home              Anticipated d/c is to: SNF              Anticipated d/c date is: 1 day              Patient currently is not medically stable to d/c.  Patient is currently unsafe discharge as she was previously ambulatory with minimal assistance per daughter's report.  Suspect  worsening dementia with associated progressive weakness.  Consultants:   None  Procedures:  TTE IMPRESSIONS     1. Left ventricular ejection fraction, by estimation, is 60 to 65%. The  left ventricle has normal function. The left ventricle has no regional  wall motion abnormalities. There is moderate left ventricular hypertrophy.  Left ventricular diastolic  parameters are consistent with Grade I diastolic dysfunction (impaired  relaxation).  2. Right ventricular systolic function is normal. The right ventricular  size is normal.  3. Nodular calcification of anterior leaflet tip. The mitral valve is  normal in structure. Mild to moderate mitral valve regurgitation. No  evidence of mitral stenosis.  4. Tricuspid valve regurgitation is moderate.  5. The aortic valve is tricuspid. Aortic valve regurgitation is trivial.  Mild to moderate aortic valve sclerosis/calcification is present, without  any evidence of aortic stenosis.  6. The inferior vena cava is normal in size with greater than 50%  respiratory variability, suggesting right atrial pressure of 3 mmHg.   Vascular duplex venous ultrasound bilateral lower extremities: Pending  Antimicrobials:   None   Subjective: Patient seen and examined bedside, resting comfortably.  Pleasantly confused.  No family present.  Updated family by telephone.  No specific complaints at this time.  Denies headache, no chest pain, no palpitations, no current shortness of breath, no nausea cefonicid/diarrhea, no fever/chills/night sweats, no abdominal pain.  No acute events overnight per nursing staff.  Objective: Vitals:   01/23/20 1630 01/23/20 2137 01/24/20 0533 01/24/20 0930  BP: (!) 113/59 127/83 (!) 141/83 (!) 143/72  Pulse: 100 100 89 (!) 107  Resp: 18 15 15 18   Temp:  98.4 F (36.9 C) 97.8 F (36.6 C) 98 F (36.7 C)  TempSrc:    Oral  SpO2: 100% 98% 98% 97%  Weight:      Height:        Intake/Output Summary (Last 24 hours) at 01/24/2020 1219 Last data filed at 01/24/2020 0900 Gross per 24 hour  Intake 420 ml  Output 700 ml  Net  -280 ml   Filed Weights   01/21/20 2029 01/22/20 1535 01/22/20 2125  Weight: 84 kg 86.8 kg 86.8 kg    Examination:  General exam: Appears calm and comfortable, pleasantly confused Respiratory system: Clear to auscultation. Respiratory effort normal.  Oxygenating well on room air Cardiovascular system: S1 & S2 heard, RRR. No JVD, murmurs, rubs, gallops or clicks.  Right lower extremity asymmetric in terms of edema versus left Gastrointestinal system: Abdomen is nondistended, soft and nontender. No organomegaly or masses felt. Normal bowel sounds heard. Central nervous system: Alert.  Not oriented to person/place/time/situation, no focal neurological deficits. Extremities: Symmetric 5 x 5 power. Skin: No rashes, lesions or ulcers Psychiatry: Judgement and insight appear poor. Mood & affect appropriate.     Data Reviewed: I have personally reviewed following labs and imaging studies  CBC: Recent Labs  Lab 01/21/20 2136 01/22/20 0210  WBC 5.9 5.8  HGB 11.5* 10.3*  HCT 39.3 34.8*  MCV 76.2* 75.7*  PLT 190 [id number]   Basic Metabolic Panel: Recent Labs  Lab 01/21/20 2136 01/22/20 0210  NA 135 140  K 4.0 3.7  CL 101 106  CO2 22 29  GLUCOSE 78 85  BUN 12 10  CREATININE 0.59 0.65  CALCIUM 8.7* 8.9   GFR: Estimated Creatinine Clearance: 68.4 mL/min (by C-G formula based on SCr of 0.65 mg/dL). Liver Function Tests: No results for input(s): AST, ALT, ALKPHOS, BILITOT, PROT, ALBUMIN in the  last 168 hours. No results for input(s): LIPASE, AMYLASE in the last 168 hours. No results for input(s): AMMONIA in the last 168 hours. Coagulation Profile: No results for input(s): INR, PROTIME in the last 168 hours. Cardiac Enzymes: No results for input(s): CKTOTAL, CKMB, CKMBINDEX, TROPONINI in the last 168 hours. BNP (last 3 results) No results for input(s): PROBNP in the last 8760 hours. HbA1C: No results for input(s): HGBA1C in the last 72 hours. CBG: Recent Labs  Lab  01/23/20 2137 01/24/20 0652 01/24/20 0722 01/24/20 0811 01/24/20 1110  GLUCAP 147* 62* 65* 98 124*   Lipid Profile: No results for input(s): CHOL, HDL, LDLCALC, TRIG, CHOLHDL, LDLDIRECT in the last 72 hours. Thyroid Function Tests: No results for input(s): TSH, T4TOTAL, FREET4, T3FREE, THYROIDAB in the last 72 hours. Anemia Panel: Recent Labs    01/22/20 0920  VITAMINB12 204  FOLATE 8.8  FERRITIN 137  TIBC 186*  IRON 74   Sepsis Labs: Recent Labs  Lab 01/22/20 0116  PROCALCITON <0.10    Recent Results (from the past 240 hour(s))  Urine culture     Status: None   Collection Time: 01/21/20 10:11 PM   Specimen: Urine, Catheterized  Result Value Ref Range Status   Specimen Description URINE, CATHETERIZED  Final   Special Requests NONE  Final   Culture   Final    NO GROWTH Performed at [facility], [address], [place], [place] [address]    Report Status 01/23/2020 FINAL  Final  SARS Coronavirus 2 by RT PCR (hospital order, performed in [facility] hospital lab) Nasopharyngeal Nasopharyngeal Swab     Status: None   Collection Time: 01/22/20  7:42 AM   Specimen: Nasopharyngeal Swab  Result Value Ref Range Status   SARS Coronavirus 2 NEGATIVE NEGATIVE Final    Comment: (NOTE) SARS-CoV-2 target nucleic acids are NOT DETECTED. The SARS-CoV-2 RNA is generally detectable in upper and lower respiratory specimens during the acute phase of infection. The lowest concentration of SARS-CoV-2 viral copies this assay can detect is 250 copies / mL. A negative result does not preclude SARS-CoV-2 infection and should not be used as the sole basis for treatment or other patient management decisions.  A negative result may occur with improper specimen collection / handling, submission of specimen other than nasopharyngeal swab, presence of viral mutation(s) within the areas targeted by this assay, and inadequate number of viral copies (<250 copies / mL). A negative result  must be combined with clinical observations, patient history, and epidemiological information. Fact Sheet for Patients:   StrictlyIdeas.no Fact Sheet for Healthcare Providers: BankingDealers.co.za This test is not yet approved or cleared  by the [place] FDA and has been authorized for detection and/or diagnosis of SARS-CoV-2 by FDA under an Emergency Use Authorization (EUA).  This EUA will remain in effect (meaning this test can be used) for the duration of the COVID-19 declaration under Section 564(b)(1) of the Act, 21 U.S.C. section 360bbb-3(b)(1), unless the authorization is terminated or revoked sooner. Performed at [facility], [place] [address] [place], [place] [address]   MRSA PCR Screening     Status: None   Collection Time: 01/22/20  6:43 PM   Specimen: Nasal Mucosa; Nasopharyngeal  Result Value Ref Range Status   MRSA by PCR NEGATIVE NEGATIVE Final    Comment:        The GeneXpert MRSA Assay (FDA approved for NASAL specimens only), is one component of a comprehensive MRSA colonization surveillance program.  It is not intended to diagnose MRSA infection nor to guide or monitor treatment for MRSA infections. Performed at [facility], [place] [address], [place], [place] [address]          Radiology Studies: VAS [place] LOWER EXTREMITY VENOUS (DVT)  Result Date: 01/23/2020  Lower Venous DVTStudy Indications: Edema. Other Indications: Syncope. Limitations: Positioning and woody edema. Comparison Study: Prior study from 02/16/18 is available for comparison Performing Technologist: Sharion Dove RVS  Examination Guidelines: A complete evaluation includes B-mode imaging, spectral Doppler, color Doppler, and power Doppler as needed of all accessible portions of each vessel. Bilateral testing is considered an integral part of a complete examination. Limited examinations for reoccurring indications may be performed as  noted. The reflux portion of the exam is performed with the patient in reverse Trendelenburg.  +---------+---------------+---------+-----------+----------+-------------------+ RIGHT    CompressibilityPhasicitySpontaneityPropertiesThrombus Aging      +---------+---------------+---------+-----------+----------+-------------------+ CFV      Full           Yes      Yes                                      +---------+---------------+---------+-----------+----------+-------------------+ SFJ      Full                                                             +---------+---------------+---------+-----------+----------+-------------------+ FV Prox  Full                                                             +---------+---------------+---------+-----------+----------+-------------------+ FV Mid                                                Not visualized      +---------+---------------+---------+-----------+----------+-------------------+ FV Distal                                             Not visualized      +---------+---------------+---------+-----------+----------+-------------------+ PFV                                                   Not visualized      +---------+---------------+---------+-----------+----------+-------------------+ POP                     Yes      Yes                  patent by color and  Doppler             +---------+---------------+---------+-----------+----------+-------------------+ PTV                                                   Not visualized      +---------+---------------+---------+-----------+----------+-------------------+ PERO                                                  Not visualized      +---------+---------------+---------+-----------+----------+-------------------+    +---------+---------------+---------+-----------+----------+--------------+ LEFT     CompressibilityPhasicitySpontaneityPropertiesThrombus Aging +---------+---------------+---------+-----------+----------+--------------+ CFV      Full           Yes      Yes                                 +---------+---------------+---------+-----------+----------+--------------+ SFJ      Full                                                        +---------+---------------+---------+-----------+----------+--------------+ FV Prox  Full           Yes      Yes                                 +---------+---------------+---------+-----------+----------+--------------+ FV Mid   Full           Yes      Yes                                 +---------+---------------+---------+-----------+----------+--------------+ FV DistalFull           Yes      Yes                                 +---------+---------------+---------+-----------+----------+--------------+ PFV      Full                                                        +---------+---------------+---------+-----------+----------+--------------+ POP      Full                                                        +---------+---------------+---------+-----------+----------+--------------+ PTV  Not visualized +---------+---------------+---------+-----------+----------+--------------+ PERO                                                  Not visualized +---------+---------------+---------+-----------+----------+--------------+     Summary: RIGHT: - There is no evidence of deep vein thrombosis in the lower extremity. However, portions of this examination were limited- see technologist comments above.  LEFT: - Findings appear essentially unchanged compared to previous examination. - There is no evidence of deep vein thrombosis in the lower extremity. However, portions of this  examination were limited- see technologist comments above.  *See table(s) above for measurements and observations. Electronically signed by Ruta Hinds MD on 01/23/2020 at 8:50:47 AM.    Final         Scheduled Meds: . enoxaparin (LOVENOX) injection  40 mg Subcutaneous Q24H  . feeding supplement (ENSURE ENLIVE)  237 mL Oral TID BM  . multivitamin with minerals  1 tablet Oral Daily  . sodium chloride flush  3 mL Intravenous Q12H   Continuous Infusions:   LOS: 1 day    Time spent: 34 minutes spent on chart review, discussion with nursing staff, consultants, updating family and interview/physical exam; more than 50% of that time was spent in counseling and/or coordination of care.    Shivaay Stormont J [place] DO [facility] Available via Epic secure chat 7am-7pm After these hours, please refer to coverage provider listed on [email or web address] 01/24/2020, 12:19 PM

## 2020-01-24 NOTE — Progress Notes (Signed)
Chaplain followed up after family arrived. The chaplain led a conversation about Advanced Directives. The chaplain will be present to help complete and notorize the AD tomorrow at 11:00am.  [facility] [employer] Chaplain Resident For questions concerning this note please contact me by pager [phone]

## 2020-01-25 LAB — BASIC METABOLIC PANEL
Anion gap: 7 (ref 5–15)
BUN: 17 mg/dL (ref 8–23)
CO2: 27 mmol/L (ref 22–32)
Calcium: 8.4 mg/dL — ABNORMAL LOW (ref 8.9–10.3)
Chloride: 106 mmol/L (ref 98–111)
Creatinine, Ser: 0.78 mg/dL (ref 0.44–1.00)
GFR calc Af Amer: >60 mL/min (ref >60)
GFR calc non Af Amer: >60 mL/min (ref >60)
Glucose, Bld: 89 mg/dL (ref 70–99)
Potassium: 3.9 mmol/L (ref 3.5–5.1)
Sodium: 140 mmol/L (ref 135–145)

## 2020-01-25 LAB — GLUCOSE, CAPILLARY
Glucose-Capillary: 75 mg/dL (ref 70–99)
Glucose-Capillary: 93 mg/dL (ref 70–99)
Glucose-Capillary: 183 mg/dL — ABNORMAL HIGH (ref 70–99)
Glucose-Capillary: 187 mg/dL — ABNORMAL HIGH (ref 70–99)

## 2020-01-25 LAB — SARS CORONAVIRUS 2 (TAT 6-24 HRS): SARS Coronavirus 2: NEGATIVE

## 2020-01-25 NOTE — Progress Notes (Signed)
PROGRESS NOTE    [name]  [id number] DOB: 05/20/1946 DOA: 01/21/2020 PCP: Forrest Moron, MD    Brief Narrative:  [name] is a 74 year old female with past medical history remarkable for dementia who presented to the ED following a syncopal episode at home during a bath.  Patient was reportedly complaining of some shortness of breath while getting in the bath and had a short moment of loss of consciousness; upon awaking, she was less responsive than her normal baseline.  Patient with previous history of hypertension and diabetes no longer on any outpatient treatment.  ED, afebrile without leukocytosis.  Hemoglobin 10.3, sodium 140, potassium 3.7, creatinine 0.65, procalcitonin less than 0.10, troponin V, 7.  Urinalysis unrevealing.  Covid-19 PCR negative.  Chest x-ray with left lower base haziness consistent with atelectasis versus early infection.   Assessment & Plan:   Principal Problem:   Syncope Active Problems:   Type 2 diabetes mellitus, controlled ([place])   Dementia with behavioral problem ([place])  Syncope Patient presented following syncopal episode while having a bed bath.  Daughter denies any shaking moments, loss of bowel or bladder or tongue biting during the event.  Daughter reports that mother has been complaining of some leg pain, uncertain laterality.  Patient is afebrile without leukocytosis.  Urinalysis, checks x-ray unrevealing.  TTE with preserved EF, no LV wall motion abnormalities, grade 1 diastolic dysfunction, normal IVC, normal RV function no aortic stenosis. --Suspect etiology vasovagal while taking a bath --Continue to monitor on telemetry, no arrhythmias noted such far during hospitalization --PT recommends SNF, case management for coordination  Right lower extremity edema with elevated D-dimer Right lower extremity slightly asymmetric versus left.  D-dimer elevated 1.66. Vascular duplex ultrasound bilateral lower extremities  negative for DVT.  History of type 2 diabetes mellitus Diet controlled.  Hemoglobin A1c 5.2 01/01/2020.  Patient with mild hypoglycemic event this morning, MI: Improved with orange juice. --Liberalized diet to regular  --Continue monitor CBGs before every meal/at bedtime --Discontinued insulin sliding scale  History of essential hypertension Currently not on any home antihypertensive regimen.  BP 139/87.  --Continue to monitor blood pressure closely  Microcytic anemia Hemoglobin 10.3, MCV 75.7.  Iron level 74, TIBC 186, ferritin 137, B12 204. --Stable  Dementia --Continue supportive care  Weakness, debility: Daughter reports ambulatory with assistance at baseline. --PT recommends SNF, case management for coordination.  Severe protein calorie malnutrition Body mass index is 25.68 kg/m. Nutrition Status: Nutrition Problem: Severe Malnutrition Etiology: chronic illness(dementia) Signs/Symptoms: severe muscle depletion, severe fat depletion Interventions: Ensure Enlive (each supplement provides 350kcal and 20 grams of protein), MVI     DVT prophylaxis: Lovenox Code Status: Full code Family Communication: Updated patient's daughter, [name] via telephone yesterday afternoon  Disposition Plan:  Status is: Inpatient  The patient will require care spanning > 2 midnights and should be moved to inpatient because: Ongoing diagnostic testing needed not appropriate for outpatient work up and Unsafe d/c plan Barriers to discharge include still awaiting PT/OT evaluation  Dispo: The patient is from: Home              Anticipated d/c is to: SNF              Anticipated d/c date is: 1 day              Patient currently is medically stable to d/c.  Patient is currently unsafe discharge as she was previously ambulatory with minimal assistance per daughter's report.  Suspect worsening  dementia with associated progressive weakness.  Consultants:   None  Procedures:  TTE IMPRESSIONS     1. Left ventricular ejection fraction, by estimation, is 60 to 65%. The  left ventricle has normal function. The left ventricle has no regional  wall motion abnormalities. There is moderate left ventricular hypertrophy.  Left ventricular diastolic  parameters are consistent with Grade I diastolic dysfunction (impaired  relaxation).  2. Right ventricular systolic function is normal. The right ventricular  size is normal.  3. Nodular calcification of anterior leaflet tip. The mitral valve is  normal in structure. Mild to moderate mitral valve regurgitation. No  evidence of mitral stenosis.  4. Tricuspid valve regurgitation is moderate.  5. The aortic valve is tricuspid. Aortic valve regurgitation is trivial.  Mild to moderate aortic valve sclerosis/calcification is present, without  any evidence of aortic stenosis.  6. The inferior vena cava is normal in size with greater than 50%  respiratory variability, suggesting right atrial pressure of 3 mmHg.   Vascular duplex venous ultrasound bilateral lower extremities: Pending  Antimicrobials:   None   Subjective: Patient seen and examined bedside, resting comfortably.  Pleasantly confused.  No family present.  No specific complaints at this time.  Denies headache, no chest pain, no palpitations, no current shortness of breath, no nausea cefonicid/diarrhea, no fever/chills/night sweats, no abdominal pain.  No acute events overnight per nursing staff.  Objective: Vitals:   01/24/20 0930 01/24/20 1708 01/24/20 2100 01/25/20 0426  BP: (!) 143/72 (!) 107/53 116/62 139/87  Pulse: (!) 107 (!) 103 (!) 103 98  Resp: 18 18 16 16   Temp: 98 F (36.7 C) 99.1 F (37.3 C) 98.1 F (36.7 C) 99.1 F (37.3 C)  TempSrc: Oral Oral Oral Oral  SpO2: 97% 99% 100% 98%  Weight:    75.5 kg  Height:        Intake/Output Summary (Last 24 hours) at 01/25/2020 1010 Last data filed at 01/25/2020 1000 Gross per 24 hour  Intake 958 ml  Output 1000  ml  Net -42 ml   Filed Weights   01/22/20 1535 01/22/20 2125 01/25/20 0426  Weight: 86.8 kg 86.8 kg 75.5 kg    Examination:  General exam: Appears calm and comfortable, pleasantly confused Respiratory system: Clear to auscultation. Respiratory effort normal.  Oxygenating well on room air Cardiovascular system: S1 & S2 heard, RRR. No JVD, murmurs, rubs, gallops or clicks.  Right lower extremity asymmetric in terms of edema versus left Gastrointestinal system: Abdomen is nondistended, soft and nontender. No organomegaly or masses felt. Normal bowel sounds heard. Central nervous system: Alert.  Not oriented to person/place/time/situation, no focal neurological deficits. Extremities: Symmetric 5 x 5 power. Skin: No rashes, lesions or ulcers Psychiatry: Judgement and insight appear poor. Mood & affect appropriate.     Data Reviewed: I have personally reviewed following labs and imaging studies  CBC: Recent Labs  Lab 01/21/20 2136 01/22/20 0210  WBC 5.9 5.8  HGB 11.5* 10.3*  HCT 39.3 34.8*  MCV 76.2* 75.7*  PLT 190 123456   Basic Metabolic Panel: Recent Labs  Lab 01/21/20 2136 01/22/20 0210 01/25/20 0456  NA 135 140 140  K 4.0 3.7 3.9  CL 101 106 106  CO2 22 29 27   GLUCOSE 78 85 89  BUN 12 10 17   CREATININE 0.59 0.65 0.78  CALCIUM 8.7* 8.9 8.4*   GFR: Estimated Creatinine Clearance: 64.1 mL/min (by C-G formula based on SCr of 0.78 mg/dL). Liver Function Tests: No results for  input(s): AST, ALT, ALKPHOS, BILITOT, PROT, ALBUMIN in the last 168 hours. No results for input(s): LIPASE, AMYLASE in the last 168 hours. No results for input(s): AMMONIA in the last 168 hours. Coagulation Profile: No results for input(s): INR, PROTIME in the last 168 hours. Cardiac Enzymes: No results for input(s): CKTOTAL, CKMB, CKMBINDEX, TROPONINI in the last 168 hours. BNP (last 3 results) No results for input(s): PROBNP in the last 8760 hours. HbA1C: No results for input(s): HGBA1C in  the last 72 hours. CBG: Recent Labs  Lab 01/24/20 0811 01/24/20 1110 01/24/20 1611 01/24/20 2059 01/25/20 0633  GLUCAP 98 124* 167* 119* 75   Lipid Profile: No results for input(s): CHOL, HDL, LDLCALC, TRIG, CHOLHDL, LDLDIRECT in the last 72 hours. Thyroid Function Tests: No results for input(s): TSH, T4TOTAL, FREET4, T3FREE, THYROIDAB in the last 72 hours. Anemia Panel: No results for input(s): VITAMINB12, FOLATE, FERRITIN, TIBC, IRON, RETICCTPCT in the last 72 hours. Sepsis Labs: Recent Labs  Lab 01/22/20 0116  PROCALCITON <0.10    Recent Results (from the past 240 hour(s))  Urine culture     Status: None   Collection Time: 01/21/20 10:11 PM   Specimen: Urine, Catheterized  Result Value Ref Range Status   Specimen Description URINE, CATHETERIZED  Final   Special Requests NONE  Final   Culture   Final    NO GROWTH Performed at [facility], [address] [place], [place] [address]    Report Status 01/23/2020 FINAL  Final  SARS Coronavirus 2 by RT PCR (hospital order, performed in [facility] hospital lab) Nasopharyngeal Nasopharyngeal Swab     Status: None   Collection Time: 01/22/20  7:42 AM   Specimen: Nasopharyngeal Swab  Result Value Ref Range Status   SARS Coronavirus 2 NEGATIVE NEGATIVE Final    Comment: (NOTE) SARS-CoV-2 target nucleic acids are NOT DETECTED. The SARS-CoV-2 RNA is generally detectable in upper and lower respiratory specimens during the acute phase of infection. The lowest concentration of SARS-CoV-2 viral copies this assay can detect is 250 copies / mL. A negative result does not preclude SARS-CoV-2 infection and should not be used as the sole basis for treatment or other patient management decisions.  A negative result may occur with improper specimen collection / handling, submission of specimen other than nasopharyngeal swab, presence of viral mutation(s) within the areas targeted by this assay, and inadequate number of viral  copies (<250 copies / mL). A negative result must be combined with clinical observations, patient history, and epidemiological information. Fact Sheet for Patients:   StrictlyIdeas.no Fact Sheet for Healthcare Providers: BankingDealers.co.za This test is not yet approved or cleared  by the [place] FDA and has been authorized for detection and/or diagnosis of SARS-CoV-2 by FDA under an Emergency Use Authorization (EUA).  This EUA will remain in effect (meaning this test can be used) for the duration of the COVID-19 declaration under Section 564(b)(1) of the Act, 21 U.S.C. section 360bbb-3(b)(1), unless the authorization is terminated or revoked sooner. Performed at [facility], [place] [address], [place], [place] [address]   MRSA PCR Screening     Status: None   Collection Time: 01/22/20  6:43 PM   Specimen: Nasal Mucosa; Nasopharyngeal  Result Value Ref Range Status   MRSA by PCR NEGATIVE NEGATIVE Final    Comment:        The GeneXpert MRSA Assay (FDA approved for NASAL specimens only), is one component of a comprehensive MRSA colonization surveillance program.  It is not intended to diagnose MRSA infection nor to guide or monitor treatment for MRSA infections. Performed at [facility], [place] [address], [place], [place] [address]          Radiology Studies: No results found.      Scheduled Meds: . enoxaparin (LOVENOX) injection  40 mg Subcutaneous Q24H  . feeding supplement (ENSURE ENLIVE)  237 mL Oral TID BM  . multivitamin with minerals  1 tablet Oral Daily  . sodium chloride flush  3 mL Intravenous Q12H   Continuous Infusions:   LOS: 2 days    Time spent: 34 minutes spent on chart review, discussion with nursing staff, consultants, updating family and interview/physical exam; more than 50% of that time was spent in counseling and/or coordination of care.    Anayelli Lai J [place] DO [facility] Available via Epic secure chat 7am-7pm After these hours, please refer to coverage provider listed on [email or web address] 01/25/2020, 10:10 AM

## 2020-01-25 NOTE — TOC Progression Note (Signed)
Transition of Care (TOC) - Progression Note  *Will discharge to [facility] once authorization received   Patient Details  Name: [name] MRN: [id number] Date of Birth: [birth year]  Transition of Care [facility]) CM/SW Contact  Sharlet Salina Mila Homer, LCSW Phone Number: 01/25/2020, 5:01 PM  Clinical Narrative:  Talked with daughter [name] initially in person and by phone regarding SNF placement for her mother. Bed offers were provided and [facility] chosen. Irine Seal, admissions director at [facility] contacted and informed of daughter's choice and that insurance authorization would be initiated with [employer]. CSW contacted [employer] and spoke with [facility] regarding patient and facility placement. CSW provided with reference number: [id number] and fax number to send clinicals.  MD updated and COVID test requested.       Expected Discharge Plan: Skilled Nursing Facility Barriers to Discharge: Ship broker, Continued Medical Work up  Expected Discharge Plan and Services Expected Discharge Plan: [place] arrangements for the past 2 months: Single Family Home                                       Social Determinants of Health (SDOH) Interventions  No SDOH interventions requested or needed at this time.  Readmission Risk Interventions No flowsheet data found.

## 2020-01-25 NOTE — Progress Notes (Signed)
Physical Therapy Treatment Patient Details Name: [name] MRN: [id number] DOB: December 15, 1945 Today's Date: 01/25/2020    History of Present Illness [name] is a 74 year old female with past medical history remarkable for dementia who presented to the ED following a syncopal episode at home during a bath.    PT Comments    Continuing work on functional mobility and activity tolerance;  Session focused on assessing tolerance of mechanical lift for safe OOB transfers; Used Maximove for dependent transfer OOB to recliner; limited knee flexion limits how much the sling carriage can turn -- recommend trying to approach the recliner from the side; Ultimately tolerated the transfer well, and positioned comfortably in recliner.  Follow Up Recommendations  SNF     Equipment Recommendations  Other (comment)(Hoyer Lift; ambulance transport)    Recommendations for Other Services       Precautions / Restrictions Precautions Precautions: Fall Restrictions Weight Bearing Restrictions: No    Mobility  Bed Mobility Overal bed mobility: Needs Assistance Bed Mobility: Rolling Rolling: Mod assist         General bed mobility comments: Heavy mod assist to roll R and L for Maximove pad placement  Transfers Overall transfer level: Needs assistance               General transfer comment: Performed dependent lift transfer using mechanical lift; Took time to problem sove positioning as her legs are contracted into knee extension, and we can't rotate the carriage  because the pole blocked her LEs; consider lowering her to th erecliner sideways next time  Ambulation/Gait                 Stairs             Wheelchair Mobility    Modified Rankin (Stroke Patients Only)       Balance                                            Cognition Arousal/Alertness: Awake/alert Behavior During Therapy: WFL for tasks  assessed/performed Overall Cognitive Status: No family/caregiver present to determine baseline cognitive functioning Area of Impairment: Memory;Safety/judgement                               General Comments: Seems more aware of circumstances and goals of session than last PT session      Exercises      General Comments        Pertinent Vitals/Pain Pain Assessment: Faces Faces Pain Scale: Hurts even more Pain Location: Grimace with attempts at moving RLE Pain Descriptors / Indicators: Grimacing;Guarding Pain Intervention(s): Repositioned    Home Living                      Prior Function            PT Goals (current goals can now be found in the care plan section) Acute Rehab PT Goals Patient Stated Goal: pt did not state PT Goal Formulation: Patient unable to participate in goal setting Time For Goal Achievement: 02/06/20 Progress towards PT goals: Progressing toward goals    Frequency    Min 2X/week      PT Plan Current plan remains appropriate    Co-evaluation  AM-PAC PT "6 Clicks" Mobility   Outcome Measure  Help needed turning from your back to your side while in a flat bed without using bedrails?: A Lot Help needed moving from lying on your back to sitting on the side of a flat bed without using bedrails?: Total Help needed moving to and from a bed to a chair (including a wheelchair)?: Total Help needed standing up from a chair using your arms (e.g., wheelchair or bedside chair)?: Total Help needed to walk in hospital room?: Total Help needed climbing 3-5 steps with a railing? : Total 6 Click Score: 7    End of Session Equipment Utilized During Treatment: Other (comment)(Maximove) Activity Tolerance: Patient tolerated treatment well Patient left: in chair;with call bell/phone within reach;with chair alarm set Nurse Communication: Mobility status;Need for lift equipment PT Visit Diagnosis: Other abnormalities  of gait and mobility (R26.89);History of falling (Z91.81);Muscle weakness (generalized) (M62.81);Difficulty in walking, not elsewhere classified (R26.2);Pain;Other symptoms and signs involving the nervous system (R29.898) Pain - Right/Left: (bilateral) Pain - part of body: Leg     Time: [id number] PT Time Calculation (min) (ACUTE ONLY): 25 min  Charges:  $Therapeutic Activity: 23-37 mins                     Roney Marion, PT  Acute Rehabilitation Services Pager [phone] Office [place] 01/25/2020, 3:14 PM

## 2020-01-25 NOTE — Plan of Care (Signed)
  Problem: Activity: Goal: Risk for activity intolerance will decrease Outcome: Progressing   Problem: Nutrition: Goal: Adequate nutrition will be maintained Outcome: Progressing   Problem: Safety: Goal: Ability to remain free from injury will improve Outcome: Progressing   Problem: Skin Integrity: Goal: Risk for impaired skin integrity will decrease Outcome: Progressing   

## 2020-01-26 LAB — GLUCOSE, CAPILLARY
Glucose-Capillary: 63 mg/dL — ABNORMAL LOW (ref 70–99)
Glucose-Capillary: 63 mg/dL — ABNORMAL LOW (ref 70–99)
Glucose-Capillary: 87 mg/dL (ref 70–99)
Glucose-Capillary: 97 mg/dL (ref 70–99)
Glucose-Capillary: 139 mg/dL — ABNORMAL HIGH (ref 70–99)
Glucose-Capillary: 120 mg/dL — ABNORMAL HIGH (ref 70–99)

## 2020-01-26 MED ORDER — QUETIAPINE FUMARATE 50 MG PO TABS: 25.0000 mg | ORAL_TABLET | Freq: Every day | ORAL | 3 refills | Status: AC

## 2020-01-26 NOTE — Plan of Care (Signed)
°  Problem: Coping: °Goal: Level of anxiety will decrease °Outcome: Progressing °  °

## 2020-01-26 NOTE — Discharge Summary (Signed)
Physician Discharge Summary  [name] [id number] DOB: 03/16/1946 DOA: 01/21/2020  PCP: Forrest Moron, MD  Admit date: 01/21/2020 Discharge date: 01/26/2020  Time spent: 37 minutes  Recommendations for Outpatient Follow-up:  1. I have discontinued this admission Lasix 2. Please give a snack at 10 PM every night with Ensure and check sugars in the morning for the next 3 mornings confirm there is no hypoglycemia 3. Prescription given for quetiapine on discharge 4. Will need goals of care as an outpatient- 5. recommends daily weights and emollient cream to lower extremities which have stasis dermatitis 6. Skilled facility MD to recommend labs in about a week and again determine need for Lasix  Discharge Diagnoses:  Principal Problem:   Syncope Active Problems:   Type 2 diabetes mellitus, controlled ([place])   Dementia with behavioral problem [facility])   Discharge Condition: Overall fair but guarded  Diet recommendation: Heart healthy  Filed Weights   01/22/20 2125 01/25/20 0426 01/25/20 2100  Weight: 86.8 kg 75.5 kg 74 kg    History of present illness:  74 black female dementia having syncope coming in on 5/14 with syncopal episode-she was getting out of the bath seems to hit her head had a short period of LOC and was less responsive ED work-up hemoglobin 10 sodium 140 PCT 0.1 CXR?  Atelectasis >PNA   Hospital Course:  Vasovagal syncope TTE preserved EF grade 1 dysfunction no aortic stenosis-historically daughter reports no shaking episodes loss of continence and given she was afebrile UA was negative and no other findings It was felt that as monitors did not show any arrhythmia this was probably a vasovagal syncope  R LE edema + elevated dimer Dimer 1.66 ultrasound negative no further work-up  DM TY 2 with complication hypoglycemia Will need extra shakes and regular diet on discharge no sliding scale  HTN-not on meds  Microcytic anemia  iron studies did  not show any overt iron deficiency  Weakness and debility Moderate protein energy malnutrition BMI 25 Shakes as an outpatient   Discharge Exam: Vitals:   01/25/20 2100 01/26/20 0537  BP: 113/75 127/67  Pulse: (!) 109 93  Resp: 16 16  Temp: 98.4 F (36.9 C) 98.7 F (37.1 C)  SpO2: 100% 100%    Awake coherent to person only cannot tell me the hospital or place does remember her daughter Poor recollection of what happened when she fell  General: Awake pleasant sitting up in bed Cardiovascular: S1-S2 no murmur rub or gallop patient is not on telemetry Respiratory: Clear no added sound Abdomen soft no rebound no guarding Neurologically intact  Discharge Instructions   Discharge Instructions    Diet - low sodium heart healthy   Complete by: As directed    Increase activity slowly   Complete by: As directed      Allergies as of 01/26/2020      Reactions   Orange Concentrate [flavoring Agent] Shortness Of Breath, Itching, Swelling   Peach [prunus Persica] Shortness Of Breath, Itching, Swelling   Penicillins Anaphylaxis, Hives   All 'cillins' Has patient had a PCN reaction causing immediate rash, facial/tongue/throat swelling, SOB or lightheadedness with hypotension: Yes Has patient had a PCN reaction causing severe rash involving mucus membranes or skin necrosis: No Has patient had a PCN reaction that required hospitalization No Has patient had a PCN reaction occurring within the last 10 years: No If all of the above answers are "NO", then may proceed with Cephalosporin use.   Strawberry Extract Shortness  Of Breath, Itching, Swelling   Tylenol [acetaminophen] Itching   Per patient makes her itch   Adhesive [tape] Itching, Rash   Please use "paper" tape   Aspirin Itching   Latex Itching, Rash   Other Hives, Itching, Rash   NO "-CILLINS"   Pineapple Itching, Rash   Strawberry Flavor Itching, Rash      Medication List    STOP taking these medications   cetirizine  10 MG tablet Commonly known as: ZYRTEC   furosemide 40 MG tablet Commonly known as: LASIX   gabapentin 300 MG capsule Commonly known as: NEURONTIN   potassium chloride 10 MEQ tablet Commonly known as: KLOR-CON   traMADol 50 MG tablet Commonly known as: ULTRAM   triamcinolone cream 0.1 % Commonly known as: KENALOG     TAKE these medications   acetaminophen 325 MG tablet Commonly known as: TYLENOL Take 2 tablets (650 mg total) by mouth every 6 (six) hours as needed for mild pain (or Fever >/= 101).   QUEtiapine 50 MG tablet Commonly known as: SEROquel Take 0.5-1 tablets (25-50 mg total) by mouth at bedtime.      Allergies  Allergen Reactions  . Orange Concentrate [Flavoring Agent] Shortness Of Breath, Itching and Swelling  . Peach [Prunus Persica] Shortness Of Breath, Itching and Swelling  . Penicillins Anaphylaxis and Hives    All 'cillins' Has patient had a PCN reaction causing immediate rash, facial/tongue/throat swelling, SOB or lightheadedness with hypotension: Yes Has patient had a PCN reaction causing severe rash involving mucus membranes or skin necrosis: No Has patient had a PCN reaction that required hospitalization No Has patient had a PCN reaction occurring within the last 10 years: No If all of the above answers are "NO", then may proceed with Cephalosporin use.   . Strawberry Extract Shortness Of Breath, Itching and Swelling  . Tylenol [Acetaminophen] Itching    Per patient makes her itch  . Adhesive [Tape] Itching and Rash    Please use "paper" tape  . Aspirin Itching  . Latex Itching and Rash  . Other Hives, Itching and Rash    NO "-CILLINS"  . Pineapple Itching and Rash  . Strawberry Flavor Itching and Rash      The results of significant diagnostics from this hospitalization (including imaging, microbiology, ancillary and laboratory) are listed below for reference.    Significant Diagnostic Studies: DG Chest Portable 1 View  Result Date:  01/21/2020 CLINICAL DATA:  Shortness of breath EXAM: PORTABLE CHEST 1 VIEW COMPARISON:  September 18, 2017 FINDINGS: The heart size and mediastinal contours are within normal limits. Aortic knob calcifications. Hazy rounded patchy airspace opacity seen at the left lung base. The right lung is clear. No acute osseous abnormality. IMPRESSION: Hazy airspace opacity at the left lung base which may be due to atelectasis and/or early infectious etiology. Electronically Signed   By: Prudencio Pair M.D.   On: 01/21/2020 22:34   ECHOCARDIOGRAM COMPLETE  Result Date: 01/22/2020    ECHOCARDIOGRAM REPORT   Patient Name:   [name] Date of Exam: 01/22/2020 Medical Rec #:  [id number]         Height:       67.5 in Accession #:    [id number]        Weight:       185.2 lb Date of Birth:  1946-01-10         BSA:          1.968 m Patient Age:  74 years          BP:           134/58 mmHg Patient Gender: F                 HR:           78 bpm. Exam Location:  Inpatient Procedure: 2D Echo, Color Doppler and Cardiac Doppler Indications:    R55 Syncope  History:        Patient has prior history of Echocardiogram examinations, most                 recent 02/16/2018.  Sonographer:    Merrie Roof RDCS Referring Phys: [place]  1. Left ventricular ejection fraction, by estimation, is 60 to 65%. The left ventricle has normal function. The left ventricle has no regional wall motion abnormalities. There is moderate left ventricular hypertrophy. Left ventricular diastolic parameters are consistent with Grade I diastolic dysfunction (impaired relaxation).  2. Right ventricular systolic function is normal. The right ventricular size is normal.  3. Nodular calcification of anterior leaflet tip. The mitral valve is normal in structure. Mild to moderate mitral valve regurgitation. No evidence of mitral stenosis.  4. Tricuspid valve regurgitation is moderate.  5. The aortic valve is tricuspid. Aortic valve regurgitation  is trivial. Mild to moderate aortic valve sclerosis/calcification is present, without any evidence of aortic stenosis.  6. The inferior vena cava is normal in size with greater than 50% respiratory variability, suggesting right atrial pressure of 3 mmHg. FINDINGS  Left Ventricle: Left ventricular ejection fraction, by estimation, is 60 to 65%. The left ventricle has normal function. The left ventricle has no regional wall motion abnormalities. The left ventricular internal cavity size was normal in size. There is  moderate left ventricular hypertrophy. Left ventricular diastolic parameters are consistent with Grade I diastolic dysfunction (impaired relaxation). Right Ventricle: The right ventricular size is normal. No increase in right ventricular wall thickness. Right ventricular systolic function is normal. Left Atrium: Left atrial size was normal in size. Right Atrium: Right atrial size was normal in size. Pericardium: There is no evidence of pericardial effusion. Mitral Valve: Nodular calcification of anterior leaflet tip. The mitral valve is normal in structure. There is moderate thickening of the mitral valve leaflet(s). There is moderate calcification of the mitral valve leaflet(s). Normal mobility of the mitral valve leaflets. Mild to moderate mitral valve regurgitation. No evidence of mitral valve stenosis. Tricuspid Valve: The tricuspid valve is normal in structure. Tricuspid valve regurgitation is moderate . No evidence of tricuspid stenosis. Aortic Valve: The aortic valve is tricuspid. Aortic valve regurgitation is trivial. Mild to moderate aortic valve sclerosis/calcification is present, without any evidence of aortic stenosis. Pulmonic Valve: The pulmonic valve was normal in structure. Pulmonic valve regurgitation is mild. No evidence of pulmonic stenosis. Aorta: The aortic root is normal in size and structure. Venous: The inferior vena cava is normal in size with greater than 50% respiratory  variability, suggesting right atrial pressure of 3 mmHg. IAS/Shunts: No atrial level shunt detected by color flow Doppler.  LEFT VENTRICLE PLAX 2D LVIDd:         4.50 cm     Diastology LVIDs:         2.40 cm     LV e' lateral:   7.62 cm/s LV PW:         1.40 cm     LV E/e' lateral: 10.6 LV IVS:  1.40 cm     LV e' medial:    6.96 cm/s LVOT diam:     2.00 cm     LV E/e' medial:  11.6 LV SV:         67 LV SV Index:   34 LVOT Area:     3.14 cm  LV Volumes (MOD) LV vol d, MOD A4C: 72.3 ml LV vol s, MOD A4C: 23.3 ml LV SV MOD A4C:     72.3 ml RIGHT VENTRICLE RV Basal diam:  4.90 cm RV Mid diam:    3.80 cm LEFT ATRIUM             Index       RIGHT ATRIUM           Index LA diam:        3.30 cm 1.68 cm/m  RA Area:     18.10 cm LA Vol (A2C):   85.2 ml 43.29 ml/m RA Volume:   56.80 ml  28.86 ml/m LA Vol (A4C):   39.7 ml 20.17 ml/m LA Biplane Vol: 62.0 ml 31.50 ml/m  AORTIC VALVE LVOT Vmax:   106.00 cm/s LVOT Vmean:  72.600 cm/s LVOT VTI:    0.214 m  AORTA Ao Root diam: 3.30 cm Ao Asc diam:  3.70 cm MITRAL VALVE MV Area (PHT): 4.63 cm     SHUNTS MV Decel Time: 164 msec     Systemic VTI:  0.21 m MV E velocity: 80.60 cm/s   Systemic Diam: 2.00 cm MV A velocity: 105.00 cm/s MV E/A ratio:  0.77 Jenkins Rouge MD Electronically signed by Jenkins Rouge MD Signature Date/Time: 01/22/2020/10:14:38 AM    Final    VAS [place] LOWER EXTREMITY VENOUS (DVT)  Result Date: 01/23/2020  Lower Venous DVTStudy Indications: Edema. Other Indications: Syncope. Limitations: Positioning and woody edema. Comparison Study: Prior study from 02/16/18 is available for comparison Performing Technologist: Sharion Dove RVS  Examination Guidelines: A complete evaluation includes B-mode imaging, spectral Doppler, color Doppler, and power Doppler as needed of all accessible portions of each vessel. Bilateral testing is considered an integral part of a complete examination. Limited examinations for reoccurring indications may be performed as noted. The  reflux portion of the exam is performed with the patient in reverse Trendelenburg.  +---------+---------------+---------+-----------+----------+-------------------+ RIGHT    CompressibilityPhasicitySpontaneityPropertiesThrombus Aging      +---------+---------------+---------+-----------+----------+-------------------+ CFV      Full           Yes      Yes                                      +---------+---------------+---------+-----------+----------+-------------------+ SFJ      Full                                                             +---------+---------------+---------+-----------+----------+-------------------+ FV Prox  Full                                                             +---------+---------------+---------+-----------+----------+-------------------+  FV Mid                                                Not visualized      +---------+---------------+---------+-----------+----------+-------------------+ FV Distal                                             Not visualized      +---------+---------------+---------+-----------+----------+-------------------+ PFV                                                   Not visualized      +---------+---------------+---------+-----------+----------+-------------------+ POP                     Yes      Yes                  patent by color and                                                       Doppler             +---------+---------------+---------+-----------+----------+-------------------+ PTV                                                   Not visualized      +---------+---------------+---------+-----------+----------+-------------------+ PERO                                                  Not visualized      +---------+---------------+---------+-----------+----------+-------------------+   +---------+---------------+---------+-----------+----------+--------------+ LEFT      CompressibilityPhasicitySpontaneityPropertiesThrombus Aging +---------+---------------+---------+-----------+----------+--------------+ CFV      Full           Yes      Yes                                 +---------+---------------+---------+-----------+----------+--------------+ SFJ      Full                                                        +---------+---------------+---------+-----------+----------+--------------+ FV Prox  Full           Yes      Yes                                 +---------+---------------+---------+-----------+----------+--------------+ FV Mid   Full  Yes      Yes                                 +---------+---------------+---------+-----------+----------+--------------+ FV DistalFull           Yes      Yes                                 +---------+---------------+---------+-----------+----------+--------------+ PFV      Full                                                        +---------+---------------+---------+-----------+----------+--------------+ POP      Full                                                        +---------+---------------+---------+-----------+----------+--------------+ PTV                                                   Not visualized +---------+---------------+---------+-----------+----------+--------------+ PERO                                                  Not visualized +---------+---------------+---------+-----------+----------+--------------+     Summary: RIGHT: - There is no evidence of deep vein thrombosis in the lower extremity. However, portions of this examination were limited- see technologist comments above.  LEFT: - Findings appear essentially unchanged compared to previous examination. - There is no evidence of deep vein thrombosis in the lower extremity. However, portions of this examination were limited- see technologist comments above.  *See table(s) above  for measurements and observations. Electronically signed by Ruta Hinds MD on 01/23/2020 at 8:50:47 AM.    Final     Microbiology: Recent Results (from the past 240 hour(s))  Urine culture     Status: None   Collection Time: 01/21/20 10:11 PM   Specimen: Urine, Catheterized  Result Value Ref Range Status   Specimen Description URINE, CATHETERIZED  Final   Special Requests NONE  Final   Culture   Final    NO GROWTH Performed at [facility], [address], [place], [place] [address]    Report Status 01/23/2020 FINAL  Final  SARS Coronavirus 2 by RT PCR (hospital order, performed in [facility] hospital lab) Nasopharyngeal Nasopharyngeal Swab     Status: None   Collection Time: 01/22/20  7:42 AM   Specimen: Nasopharyngeal Swab  Result Value Ref Range Status   SARS Coronavirus 2 NEGATIVE NEGATIVE Final    Comment: (NOTE) SARS-CoV-2 target nucleic acids are NOT DETECTED. The SARS-CoV-2 RNA is generally detectable in upper and lower respiratory specimens during the acute phase of infection. The lowest concentration of SARS-CoV-2 viral copies this assay can detect is 250 copies / mL. A negative  result does not preclude SARS-CoV-2 infection and should not be used as the sole basis for treatment or other patient management decisions.  A negative result may occur with improper specimen collection / handling, submission of specimen other than nasopharyngeal swab, presence of viral mutation(s) within the areas targeted by this assay, and inadequate number of viral copies (<250 copies / mL). A negative result must be combined with clinical observations, patient history, and epidemiological information. Fact Sheet for Patients:   StrictlyIdeas.no Fact Sheet for Healthcare Providers: BankingDealers.co.za This test is not yet approved or cleared  by the [place] FDA and has been authorized for detection and/or diagnosis of SARS-CoV-2  by FDA under an Emergency Use Authorization (EUA).  This EUA will remain in effect (meaning this test can be used) for the duration of the COVID-19 declaration under Section 564(b)(1) of the Act, 21 U.S.C. section 360bbb-3(b)(1), unless the authorization is terminated or revoked sooner. Performed at [facility], [place] [address], [place], [place] [address]   MRSA PCR Screening     Status: None   Collection Time: 01/22/20  6:43 PM   Specimen: Nasal Mucosa; Nasopharyngeal  Result Value Ref Range Status   MRSA by PCR NEGATIVE NEGATIVE Final    Comment:        The GeneXpert MRSA Assay (FDA approved for NASAL specimens only), is one component of a comprehensive MRSA colonization surveillance program. It is not intended to diagnose MRSA infection nor to guide or monitor treatment for MRSA infections. Performed at [facility], [place] [address] [place], [place] [address]   SARS CORONAVIRUS 2 (TAT 6-24 HRS) Nasopharyngeal Nasopharyngeal Swab     Status: None   Collection Time: 01/25/20  4:37 PM   Specimen: Nasopharyngeal Swab  Result Value Ref Range Status   SARS Coronavirus 2 NEGATIVE NEGATIVE Final    Comment: (NOTE) SARS-CoV-2 target nucleic acids are NOT DETECTED. The SARS-CoV-2 RNA is generally detectable in upper and lower respiratory specimens during the acute phase of infection. Negative results do not preclude SARS-CoV-2 infection, do not rule out co-infections with other pathogens, and should not be used as the sole basis for treatment or other patient management decisions. Negative results must be combined with clinical observations, patient history, and epidemiological information. The expected result is Negative. Fact Sheet for Patients: SugarRoll.be Fact Sheet for Healthcare Providers: https://www.woods-mathews.com/ This test is not yet approved or cleared by the [place] FDA and  has been authorized for  detection and/or diagnosis of SARS-CoV-2 by FDA under an Emergency Use Authorization (EUA). This EUA will remain  in effect (meaning this test can be used) for the duration of the COVID-19 declaration under Section 56 4(b)(1) of the Act, 21 U.S.C. section 360bbb-3(b)(1), unless the authorization is terminated or revoked sooner. Performed at [facility], [place] [address], [place], [place] [address]      Labs: Basic Metabolic Panel: Recent Labs  Lab 01/21/20 2136 01/22/20 0210 01/25/20 0456  NA 135 140 140  K 4.0 3.7 3.9  CL 101 106 106  CO2 22 29 27   GLUCOSE 78 85 89  BUN 12 10 17   CREATININE 0.59 0.65 0.78  CALCIUM 8.7* 8.9 8.4*   Liver Function Tests: No results for input(s): AST, ALT, ALKPHOS, BILITOT, PROT, ALBUMIN in the last 168 hours. No results for input(s): LIPASE, AMYLASE in the last 168 hours. No results for input(s): AMMONIA in the last 168 hours. CBC: Recent Labs  Lab 01/21/20 2136 01/22/20  0210  WBC 5.9 5.8  HGB 11.5* 10.3*  HCT 39.3 34.8*  MCV 76.2* 75.7*  PLT 190 215   Cardiac Enzymes: No results for input(s): CKTOTAL, CKMB, CKMBINDEX, TROPONINI in the last 168 hours. BNP: BNP (last 3 results) No results for input(s): BNP in the last 8760 hours.  ProBNP (last 3 results) No results for input(s): PROBNP in the last 8760 hours.  CBG: Recent Labs  Lab 01/25/20 1629 01/25/20 2101 01/26/20 0633 01/26/20 0649 01/26/20 0710  GLUCAP 183* 187* 63* 63* 87       Signed:  Nita Sells MD   [facility] 01/26/2020, 10:20 AM

## 2020-01-26 NOTE — Progress Notes (Signed)
Hypoglycemic Event  CBG: 63  Treatment: 8 oz juice/soda  Symptoms: None  Follow-up CBG: Time: @ 0710 CBG Result: 87  Possible Reasons for Event: Inadequate meal intake      Deseray Daponte Nghus  Leigh Blas

## 2020-01-26 NOTE — Progress Notes (Signed)
Occupational Therapy Treatment Patient Details Name: [name] MRN: [id number] DOB: [birth year] Today's Date: 01/26/2020    History of present illness [name] is a 74 year old female with past medical history remarkable for dementia who presented to the ED following a syncopal episode at home during a bath.   OT comments  Patient is pleasantly confused, agreeable to bed exercises. Patient is very distractible requiring cues to redirect. Patient participate in exercises listed below. Will continue to follow.   Follow Up Recommendations  SNF;Supervision/Assistance - 24 hour    Equipment Recommendations  3 in 1 bedside commode;Hospital bed;Other (comment)(hoyer lift)       Precautions / Restrictions Precautions Precautions: Fall Restrictions Weight Bearing Restrictions: No              ADL either performed or assessed with clinical judgement   ADL Overall ADL's : Needs assistance/impaired Eating/Feeding: Set up;Bed level Eating/Feeding Details (indicate cue type and reason): to drink from cup                                                   Cognition Arousal/Alertness: Awake/alert Behavior During Therapy: WFL for tasks assessed/performed Overall Cognitive Status: No family/caregiver present to determine baseline cognitive functioning                                 General Comments: per chart patient has history of dementia. patient thinks shes at church this morning, has difficulty remembering son in law stopped by after 5 min time lapse. very pleasantly confused        Exercises Exercises: General Upper Extremity;General Lower Extremity General Exercises - Upper Extremity Shoulder Flexion: AROM;Both;20 reps;Supine Elbow Flexion: AROM;Both;20 reps;Supine Elbow Extension: AROM;Both;20 reps;Supine Digit Composite Flexion: AROM;Both;20 reps;Supine Composite Extension: AROM;Both;Supine(20) General  Exercises - Lower Extremity Ankle Circles/Pumps: AAROM;Both;10 reps;Supine      General Comments attempt to reposition LEs with pillow to minimize skin break down, pt returns to adducted posture     Pertinent Vitals/ Pain       Pain Assessment: Faces Faces Pain Scale: No hurt         Frequency  Min 2X/week        Progress Toward Goals  OT Goals(current goals can now be found in the care plan section)  Progress towards OT goals: Progressing toward goals  Acute Rehab OT Goals Patient Stated Goal: pt did not state OT Goal Formulation: With patient Time For Goal Achievement: 02/06/20 Potential to Achieve Goals: Fair ADL Goals Pt Will Perform Eating: with supervision;sitting Pt Will Perform Grooming: with set-up;sitting  Plan Discharge plan remains appropriate       AM-PAC OT "6 Clicks" Daily Activity     Outcome Measure   Help from another person eating meals?: A Lot Help from another person taking care of personal grooming?: A Lot Help from another person toileting, which includes using toliet, bedpan, or urinal?: Total Help from another person bathing (including washing, rinsing, drying)?: A Lot Help from another person to put on and taking off regular upper body clothing?: A Lot Help from another person to put on and taking off regular lower body clothing?: Total 6 Click Score: 10    End of Session  OT Visit Diagnosis: Unsteadiness on feet (R26.81);Other abnormalities of gait  and mobility (R26.89);Muscle weakness (generalized) (M62.81);Other symptoms and signs involving cognitive function;Pain Pain - Right/Left: Left Pain - part of body: Leg   Activity Tolerance Patient tolerated treatment well   Patient Left in bed;with call bell/phone within reach;with bed alarm set           Time: [id number] OT Time Calculation (min): 21 min  Charges: OT General Charges $OT Visit: 1 Visit OT Treatments $Therapeutic Exercise: 8-22 mins  Delbert Phenix OT OT office:  [place] 01/26/2020, 11:55 AM

## 2020-01-27 LAB — GLUCOSE, CAPILLARY
Glucose-Capillary: 75 mg/dL (ref 70–99)
Glucose-Capillary: 102 mg/dL — ABNORMAL HIGH (ref 70–99)
Glucose-Capillary: 166 mg/dL — ABNORMAL HIGH (ref 70–99)
Glucose-Capillary: 191 mg/dL — ABNORMAL HIGH (ref 70–99)

## 2020-01-27 NOTE — TOC Transition Note (Signed)
Transition of Care [facility]) - CM/SW Discharge Note *Discharged to [facility] and [place] Number for Report - [phone]   Patient Details  Name: [name] MRN: [id number] Date of Birth: Apr 25, 1946  Transition of Care [facility]) CM/SW Contact:  Sable Feil, LCSW Phone Number: 01/27/2020, 2:38 PM   Clinical Narrative:  Patient medically stable for discharge and insurance authorization received today from [place]: [place] #[id number]; Reference [id number], Authorized for 5 days, effective 5/20, next review date 5/24; Care coordinator - Cristobal Goldmann, fax number for clinicals [phone].  MD advised that insurance auth received and patient can d/c today to [facility] Facility admissions director Irine Seal contacted regarding receipt of authorization and information provided. Discharge clinicals transmitted to [facility]. Daughter [name] contacted [phone]) and advised regarding discharge.      Final next level of care: Skilled Nursing Facility([facility]) Barriers to Discharge: Barriers Resolved   Patient Goals and CMS Choice Patient states their goals for this hospitalization and ongoing recovery are:: Daughter wants patient to get better/stronger and return home CMS Medicare.gov Compare Post Acute Care list provided to:: Patient Represenative (must comment)(Daughter [name] advised about [email or web address]) Choice offered to / list presented to : Adult Children  Discharge Placement   Existing PASRR number confirmed : 01/23/20          Patient chooses bed at: [facility] Patient to be transferred to facility by: Non--emergency ambulance Name of family member notified: Queen Blossom - daughter; [phone] Patient and family notified of of transfer: 01/27/20(Daughter contacted by phone)  Discharge Plan and Services                                    Social Determinants of Health (SDOH) Interventions  No SDOH  interventions requested or needed prior to discharge.   Readmission Risk Interventions No flowsheet data found.

## 2020-01-27 NOTE — Plan of Care (Signed)
  Problem: Nutrition: Goal: Adequate nutrition will be maintained Outcome: Progressing   

## 2020-01-27 NOTE — Care Management Important Message (Signed)
Important Message  Patient Details  Name: [name] MRN: [id number] Date of Birth: 08-Mar-1946   Medicare Important Message Given:  Yes     Orbie Pyo 01/27/2020, 8:35 AM

## 2020-01-27 NOTE — Progress Notes (Signed)
Report given to receiving facility ([facility]), all questions and concerns answered. Waiting for [facility] to transport patient.

## 2020-01-27 NOTE — Progress Notes (Signed)
[name] to be discharged Skilled nursing facility per MD order. Discussed prescriptions and follow up appointments with the patient. Prescriptions given to patient; medication list explained in detail. Patient verbalized understanding.  Skin clean, dry and intact without evidence of skin break down, no evidence of skin tears noted. IV catheter discontinued intact. Site without signs and symptoms of complications. Dressing and pressure applied. Pt denies pain at the site currently. No complaints noted.  Patient free of lines, drains, and wounds.   An After Visit Summary (AVS) was printed and given to the patient. Patient escorted via stretcher, and discharged to [facility] via [place], RN

## 2020-01-27 NOTE — Discharge Summary (Signed)
Physician Discharge Summary  [name] [id number] DOB: Jan 23, 1946 DOA: 01/21/2020  No significant change in patient condition---patient sitting and eating and drinking She is safe and optimized for d/c to SNF and Covid test is neg  PCP: Forrest Moron, MD  Admit date: 01/21/2020 Discharge date: 01/27/2020  Time spent: 37 minutes  Recommendations for Outpatient Follow-up:  1. I have discontinued this admission Lasix 2. Please give a snack at 10 PM every night with Ensure and check sugars in the morning for the next 3 mornings confirm there is no hypoglycemia 3. Prescription given for quetiapine on discharge 4. Will need goals of care as an outpatient- 5. recommends daily weights and emollient cream to lower extremities which have stasis dermatitis 6. Skilled facility MD to recommend labs in about a week and again determine need for Lasix  Discharge Diagnoses:  Principal Problem:   Syncope Active Problems:   Type 2 diabetes mellitus, controlled ([place])   Dementia with behavioral problem [facility])   Discharge Condition: Overall fair but guarded  Diet recommendation: Heart healthy  Filed Weights   01/25/20 0426 01/25/20 2100 01/27/20 0354  Weight: 75.5 kg 74 kg 73.8 kg    History of present illness:  58 black female dementia having syncope coming in on 5/14 with syncopal episode-she was getting out of the bath seems to hit her head had a short period of LOC and was less responsive ED work-up hemoglobin 10 sodium 140 PCT 0.1 CXR?  Atelectasis >PNA   Hospital Course:  Vasovagal syncope TTE preserved EF grade 1 dysfunction no aortic stenosis-historically daughter reports no shaking episodes loss of continence and given she was afebrile UA was negative and no other findings It was felt that as monitors did not show any arrhythmia this was probably a vasovagal syncope  R LE edema + elevated dimer Dimer 1.66 ultrasound negative no further work-up  DM TY 2 with  complication hypoglycemia Will need extra shakes and regular diet on discharge no sliding scale  HTN-not on meds  Microcytic anemia  iron studies did not show any overt iron deficiency  Weakness and debility Moderate protein energy malnutrition BMI 25 Shakes as an outpatient   Discharge Exam: Vitals:   01/27/20 0354 01/27/20 0835  BP: 124/76 116/68  Pulse: 98 93  Resp: 16 16  Temp: 98.2 F (36.8 C) 98.2 F (36.8 C)  SpO2: 100% 100%    Awake coherent to person only cannot tell me the hospital or place does remember her daughter Poor recollection of what happened when she fell  General: Awake pleasant sitting up in bed Cardiovascular: S1-S2 no murmur rub or gallop patient is not on telemetry Respiratory: Clear no added sound Abdomen soft no rebound no guarding Neurologically intact  Discharge Instructions   Discharge Instructions    Diet - low sodium heart healthy   Complete by: As directed    Increase activity slowly   Complete by: As directed      Allergies as of 01/27/2020      Reactions   Orange Concentrate [flavoring Agent] Shortness Of Breath, Itching, Swelling   Peach [prunus Persica] Shortness Of Breath, Itching, Swelling   Penicillins Anaphylaxis, Hives   All 'cillins' Has patient had a PCN reaction causing immediate rash, facial/tongue/throat swelling, SOB or lightheadedness with hypotension: Yes Has patient had a PCN reaction causing severe rash involving mucus membranes or skin necrosis: No Has patient had a PCN reaction that required hospitalization No Has patient had a PCN reaction occurring  within the last 10 years: No If all of the above answers are "NO", then may proceed with Cephalosporin use.   Strawberry Extract Shortness Of Breath, Itching, Swelling   Tylenol [acetaminophen] Itching   Per patient makes her itch   Adhesive [tape] Itching, Rash   Please use "paper" tape   Aspirin Itching   Latex Itching, Rash   Other Hives, Itching, Rash    NO "-CILLINS"   Pineapple Itching, Rash   Strawberry Flavor Itching, Rash      Medication List    STOP taking these medications   cetirizine 10 MG tablet Commonly known as: ZYRTEC   furosemide 40 MG tablet Commonly known as: LASIX   gabapentin 300 MG capsule Commonly known as: NEURONTIN   potassium chloride 10 MEQ tablet Commonly known as: KLOR-CON   traMADol 50 MG tablet Commonly known as: ULTRAM   triamcinolone cream 0.1 % Commonly known as: KENALOG     TAKE these medications   acetaminophen 325 MG tablet Commonly known as: TYLENOL Take 2 tablets (650 mg total) by mouth every 6 (six) hours as needed for mild pain (or Fever >/= 101).   QUEtiapine 50 MG tablet Commonly known as: SEROquel Take 0.5-1 tablets (25-50 mg total) by mouth at bedtime.      Allergies  Allergen Reactions  . Orange Concentrate [Flavoring Agent] Shortness Of Breath, Itching and Swelling  . Peach [Prunus Persica] Shortness Of Breath, Itching and Swelling  . Penicillins Anaphylaxis and Hives    All 'cillins' Has patient had a PCN reaction causing immediate rash, facial/tongue/throat swelling, SOB or lightheadedness with hypotension: Yes Has patient had a PCN reaction causing severe rash involving mucus membranes or skin necrosis: No Has patient had a PCN reaction that required hospitalization No Has patient had a PCN reaction occurring within the last 10 years: No If all of the above answers are "NO", then may proceed with Cephalosporin use.   . Strawberry Extract Shortness Of Breath, Itching and Swelling  . Tylenol [Acetaminophen] Itching    Per patient makes her itch  . Adhesive [Tape] Itching and Rash    Please use "paper" tape  . Aspirin Itching  . Latex Itching and Rash  . Other Hives, Itching and Rash    NO "-CILLINS"  . Pineapple Itching and Rash  . Strawberry Flavor Itching and Rash      The results of significant diagnostics from this hospitalization (including imaging,  microbiology, ancillary and laboratory) are listed below for reference.    Significant Diagnostic Studies: DG Chest Portable 1 View  Result Date: 01/21/2020 CLINICAL DATA:  Shortness of breath EXAM: PORTABLE CHEST 1 VIEW COMPARISON:  September 18, 2017 FINDINGS: The heart size and mediastinal contours are within normal limits. Aortic knob calcifications. Hazy rounded patchy airspace opacity seen at the left lung base. The right lung is clear. No acute osseous abnormality. IMPRESSION: Hazy airspace opacity at the left lung base which may be due to atelectasis and/or early infectious etiology. Electronically Signed   By: Prudencio Pair M.D.   On: 01/21/2020 22:34   ECHOCARDIOGRAM COMPLETE  Result Date: 01/22/2020    ECHOCARDIOGRAM REPORT   Patient Name:   DARETH ABRESCH Date of Exam: 01/22/2020 Medical Rec #:  [id number]         Height:       67.5 in Accession #:    [id number]        Weight:       185.2 lb Date of Birth:  [birth year]         BSA:          1.968 m Patient Age:    74 years          BP:           134/58 mmHg Patient Gender: F                 HR:           78 bpm. Exam Location:  Inpatient Procedure: 2D Echo, Color Doppler and Cardiac Doppler Indications:    R55 Syncope  History:        Patient has prior history of Echocardiogram examinations, most                 recent 02/16/2018.  Sonographer:    Merrie Roof RDCS Referring Phys: [place]  1. Left ventricular ejection fraction, by estimation, is 60 to 65%. The left ventricle has normal function. The left ventricle has no regional wall motion abnormalities. There is moderate left ventricular hypertrophy. Left ventricular diastolic parameters are consistent with Grade I diastolic dysfunction (impaired relaxation).  2. Right ventricular systolic function is normal. The right ventricular size is normal.  3. Nodular calcification of anterior leaflet tip. The mitral valve is normal in structure. Mild to moderate mitral valve  regurgitation. No evidence of mitral stenosis.  4. Tricuspid valve regurgitation is moderate.  5. The aortic valve is tricuspid. Aortic valve regurgitation is trivial. Mild to moderate aortic valve sclerosis/calcification is present, without any evidence of aortic stenosis.  6. The inferior vena cava is normal in size with greater than 50% respiratory variability, suggesting right atrial pressure of 3 mmHg. FINDINGS  Left Ventricle: Left ventricular ejection fraction, by estimation, is 60 to 65%. The left ventricle has normal function. The left ventricle has no regional wall motion abnormalities. The left ventricular internal cavity size was normal in size. There is  moderate left ventricular hypertrophy. Left ventricular diastolic parameters are consistent with Grade I diastolic dysfunction (impaired relaxation). Right Ventricle: The right ventricular size is normal. No increase in right ventricular wall thickness. Right ventricular systolic function is normal. Left Atrium: Left atrial size was normal in size. Right Atrium: Right atrial size was normal in size. Pericardium: There is no evidence of pericardial effusion. Mitral Valve: Nodular calcification of anterior leaflet tip. The mitral valve is normal in structure. There is moderate thickening of the mitral valve leaflet(s). There is moderate calcification of the mitral valve leaflet(s). Normal mobility of the mitral valve leaflets. Mild to moderate mitral valve regurgitation. No evidence of mitral valve stenosis. Tricuspid Valve: The tricuspid valve is normal in structure. Tricuspid valve regurgitation is moderate . No evidence of tricuspid stenosis. Aortic Valve: The aortic valve is tricuspid. Aortic valve regurgitation is trivial. Mild to moderate aortic valve sclerosis/calcification is present, without any evidence of aortic stenosis. Pulmonic Valve: The pulmonic valve was normal in structure. Pulmonic valve regurgitation is mild. No evidence of pulmonic  stenosis. Aorta: The aortic root is normal in size and structure. Venous: The inferior vena cava is normal in size with greater than 50% respiratory variability, suggesting right atrial pressure of 3 mmHg. IAS/Shunts: No atrial level shunt detected by color flow Doppler.  LEFT VENTRICLE PLAX 2D LVIDd:         4.50 cm     Diastology LVIDs:         2.40 cm     LV e' lateral:  7.62 cm/s LV PW:         1.40 cm     LV E/e' lateral: 10.6 LV IVS:        1.40 cm     LV e' medial:    6.96 cm/s LVOT diam:     2.00 cm     LV E/e' medial:  11.6 LV SV:         67 LV SV Index:   34 LVOT Area:     3.14 cm  LV Volumes (MOD) LV vol d, MOD A4C: 72.3 ml LV vol s, MOD A4C: 23.3 ml LV SV MOD A4C:     72.3 ml RIGHT VENTRICLE RV Basal diam:  4.90 cm RV Mid diam:    3.80 cm LEFT ATRIUM             Index       RIGHT ATRIUM           Index LA diam:        3.30 cm 1.68 cm/m  RA Area:     18.10 cm LA Vol (A2C):   85.2 ml 43.29 ml/m RA Volume:   56.80 ml  28.86 ml/m LA Vol (A4C):   39.7 ml 20.17 ml/m LA Biplane Vol: 62.0 ml 31.50 ml/m  AORTIC VALVE LVOT Vmax:   106.00 cm/s LVOT Vmean:  72.600 cm/s LVOT VTI:    0.214 m  AORTA Ao Root diam: 3.30 cm Ao Asc diam:  3.70 cm MITRAL VALVE MV Area (PHT): 4.63 cm     SHUNTS MV Decel Time: 164 msec     Systemic VTI:  0.21 m MV E velocity: 80.60 cm/s   Systemic Diam: 2.00 cm MV A velocity: 105.00 cm/s MV E/A ratio:  0.77 Jenkins Rouge MD Electronically signed by Jenkins Rouge MD Signature Date/Time: 01/22/2020/10:14:38 AM    Final    VAS [place] LOWER EXTREMITY VENOUS (DVT)  Result Date: 01/23/2020  Lower Venous DVTStudy Indications: Edema. Other Indications: Syncope. Limitations: Positioning and woody edema. Comparison Study: Prior study from 02/16/18 is available for comparison Performing Technologist: Sharion Dove RVS  Examination Guidelines: A complete evaluation includes B-mode imaging, spectral Doppler, color Doppler, and power Doppler as needed of all accessible portions of each vessel.  Bilateral testing is considered an integral part of a complete examination. Limited examinations for reoccurring indications may be performed as noted. The reflux portion of the exam is performed with the patient in reverse Trendelenburg.  +---------+---------------+---------+-----------+----------+-------------------+ RIGHT    CompressibilityPhasicitySpontaneityPropertiesThrombus Aging      +---------+---------------+---------+-----------+----------+-------------------+ CFV      Full           Yes      Yes                                      +---------+---------------+---------+-----------+----------+-------------------+ SFJ      Full                                                             +---------+---------------+---------+-----------+----------+-------------------+ FV Prox  Full                                                             +---------+---------------+---------+-----------+----------+-------------------+  FV Mid                                                Not visualized      +---------+---------------+---------+-----------+----------+-------------------+ FV Distal                                             Not visualized      +---------+---------------+---------+-----------+----------+-------------------+ PFV                                                   Not visualized      +---------+---------------+---------+-----------+----------+-------------------+ POP                     Yes      Yes                  patent by color and                                                       Doppler             +---------+---------------+---------+-----------+----------+-------------------+ PTV                                                   Not visualized      +---------+---------------+---------+-----------+----------+-------------------+ PERO                                                  Not visualized       +---------+---------------+---------+-----------+----------+-------------------+   +---------+---------------+---------+-----------+----------+--------------+ LEFT     CompressibilityPhasicitySpontaneityPropertiesThrombus Aging +---------+---------------+---------+-----------+----------+--------------+ CFV      Full           Yes      Yes                                 +---------+---------------+---------+-----------+----------+--------------+ SFJ      Full                                                        +---------+---------------+---------+-----------+----------+--------------+ FV Prox  Full           Yes      Yes                                 +---------+---------------+---------+-----------+----------+--------------+ FV Mid   Full  Yes      Yes                                 +---------+---------------+---------+-----------+----------+--------------+ FV DistalFull           Yes      Yes                                 +---------+---------------+---------+-----------+----------+--------------+ PFV      Full                                                        +---------+---------------+---------+-----------+----------+--------------+ POP      Full                                                        +---------+---------------+---------+-----------+----------+--------------+ PTV                                                   Not visualized +---------+---------------+---------+-----------+----------+--------------+ PERO                                                  Not visualized +---------+---------------+---------+-----------+----------+--------------+     Summary: RIGHT: - There is no evidence of deep vein thrombosis in the lower extremity. However, portions of this examination were limited- see technologist comments above.  LEFT: - Findings appear essentially unchanged compared to previous examination. - There is no  evidence of deep vein thrombosis in the lower extremity. However, portions of this examination were limited- see technologist comments above.  *See table(s) above for measurements and observations. Electronically signed by Ruta Hinds MD on 01/23/2020 at 8:50:47 AM.    Final     Microbiology: Recent Results (from the past 240 hour(s))  Urine culture     Status: None   Collection Time: 01/21/20 10:11 PM   Specimen: Urine, Catheterized  Result Value Ref Range Status   Specimen Description URINE, CATHETERIZED  Final   Special Requests NONE  Final   Culture   Final    NO GROWTH Performed at [facility], [address], [place],  [address]    Report Status 01/23/2020 FINAL  Final  SARS Coronavirus 2 by RT PCR (hospital order, performed in [facility] hospital lab) Nasopharyngeal Nasopharyngeal Swab     Status: None   Collection Time: 01/22/20  7:42 AM   Specimen: Nasopharyngeal Swab  Result Value Ref Range Status   SARS Coronavirus 2 NEGATIVE NEGATIVE Final    Comment: (NOTE) SARS-CoV-2 target nucleic acids are NOT DETECTED. The SARS-CoV-2 RNA is generally detectable in upper and lower respiratory specimens during the acute phase of infection. The lowest concentration of SARS-CoV-2 viral copies this assay can detect is 250 copies / mL. A negative  result does not preclude SARS-CoV-2 infection and should not be used as the sole basis for treatment or other patient management decisions.  A negative result may occur with improper specimen collection / handling, submission of specimen other than nasopharyngeal swab, presence of viral mutation(s) within the areas targeted by this assay, and inadequate number of viral copies (<250 copies / mL). A negative result must be combined with clinical observations, patient history, and epidemiological information. Fact Sheet for Patients:   StrictlyIdeas.no Fact Sheet for Healthcare  Providers: BankingDealers.co.za This test is not yet approved or cleared  by the [place] FDA and has been authorized for detection and/or diagnosis of SARS-CoV-2 by FDA under an Emergency Use Authorization (EUA).  This EUA will remain in effect (meaning this test can be used) for the duration of the COVID-19 declaration under Section 564(b)(1) of the Act, 21 U.S.C. section 360bbb-3(b)(1), unless the authorization is terminated or revoked sooner. Performed at [facility], [place] [address] [place], [place] [address]   MRSA PCR Screening     Status: None   Collection Time: 01/22/20  6:43 PM   Specimen: Nasal Mucosa; Nasopharyngeal  Result Value Ref Range Status   MRSA by PCR NEGATIVE NEGATIVE Final    Comment:        The GeneXpert MRSA Assay (FDA approved for NASAL specimens only), is one component of a comprehensive MRSA colonization surveillance program. It is not intended to diagnose MRSA infection nor to guide or monitor treatment for MRSA infections. Performed at [facility], [place] [address], [place], [place] [address]   SARS CORONAVIRUS 2 (TAT 6-24 HRS) Nasopharyngeal Nasopharyngeal Swab     Status: None   Collection Time: 01/25/20  4:37 PM   Specimen: Nasopharyngeal Swab  Result Value Ref Range Status   SARS Coronavirus 2 NEGATIVE NEGATIVE Final    Comment: (NOTE) SARS-CoV-2 target nucleic acids are NOT DETECTED. The SARS-CoV-2 RNA is generally detectable in upper and lower respiratory specimens during the acute phase of infection. Negative results do not preclude SARS-CoV-2 infection, do not rule out co-infections with other pathogens, and should not be used as the sole basis for treatment or other patient management decisions. Negative results must be combined with clinical observations, patient history, and epidemiological information. The expected result is Negative. Fact Sheet for  Patients: SugarRoll.be Fact Sheet for Healthcare Providers: https://www.woods-mathews.com/ This test is not yet approved or cleared by the [place] FDA and  has been authorized for detection and/or diagnosis of SARS-CoV-2 by FDA under an Emergency Use Authorization (EUA). This EUA will remain  in effect (meaning this test can be used) for the duration of the COVID-19 declaration under Section 56 4(b)(1) of the Act, 21 U.S.C. section 360bbb-3(b)(1), unless the authorization is terminated or revoked sooner. Performed at [facility], [place] [address], [place], [place] [address]      Labs: Basic Metabolic Panel: Recent Labs  Lab 01/21/20 2136 01/22/20 0210 01/25/20 0456  NA 135 140 140  K 4.0 3.7 3.9  CL 101 106 106  CO2 22 29 27   GLUCOSE 78 85 89  BUN 12 10 17   CREATININE 0.59 0.65 0.78  CALCIUM 8.7* 8.9 8.4*   Liver Function Tests: No results for input(s): AST, ALT, ALKPHOS, BILITOT, PROT, ALBUMIN in the last 168 hours. No results for input(s): LIPASE, AMYLASE in the last 168 hours. No results for input(s): AMMONIA in the last 168 hours. CBC: Recent Labs  Lab 01/21/20 2136 01/22/20  0210  WBC 5.9 5.8  HGB 11.5* 10.3*  HCT 39.3 34.8*  MCV 76.2* 75.7*  PLT 190 215   Cardiac Enzymes: No results for input(s): CKTOTAL, CKMB, CKMBINDEX, TROPONINI in the last 168 hours. BNP: BNP (last 3 results) No results for input(s): BNP in the last 8760 hours.  ProBNP (last 3 results) No results for input(s): PROBNP in the last 8760 hours.  CBG: Recent Labs  Lab 01/26/20 1155 01/26/20 1755 01/26/20 2124 01/27/20 0631 01/27/20 1136  GLUCAP 97 139* 120* 75 102*       Signed:  Nita Sells MD   [facility] 01/27/2020, 1:36 PM

## 2020-01-27 NOTE — Progress Notes (Signed)
Performed peer-to-peer with Dr. Remo Lipps this morning at 1040.  Verneita Griffes, MD [facility] 10:46 AM

## 2020-01-27 NOTE — Progress Notes (Signed)
Physical Therapy Treatment Patient Details Name: [name] MRN: [id number] DOB: 1946/07/03 Today's Date: 01/27/2020    History of Present Illness [name] is a 74 year old female with past medical history remarkable for dementia who presented to the ED following a syncopal episode at home during a bath.    PT Comments    Pt happy and interactive.  Not aware of her deficits and usual level of function.  Transitioned pt to EOB to work on balance before returning to supine, rolling to place maximove lift pad and transferring to the chair.    Follow Up Recommendations  SNF     Equipment Recommendations  Other (comment)(TBA)    Recommendations for Other Services       Precautions / Restrictions Precautions Precautions: Fall    Mobility  Bed Mobility Overal bed mobility: Needs Assistance Bed Mobility: Rolling;Supine to Sit;Sit to Supine Rolling: Mod assist   Supine to sit: Max assist;Total assist;+2 for physical assistance(with head minimally elevated) Sit to supine: Total assist;+2 for physical assistance   General bed mobility comments: cues for direction and redirect.  Assist for roll, scoot and pivot with truncal assist to come up and forward.  Pt is mildly resistant overall ?due to pain or fears  Transfers Overall transfer level: Needs assistance               General transfer comment: Performed dependent lift transfer using mechanical lift; Took time to problem sove positioning as her legs are contracted into knee extension, and we can't rotate the carriage  because the pole blocked her LEs; consider lowering her to th erecliner sideways next time  Ambulation/Gait                 Stairs             Wheelchair Mobility    Modified Rankin (Stroke Patients Only)       Balance Overall balance assessment: Needs assistance Sitting-balance support: Bilateral upper extremity supported;Single extremity supported Sitting  balance-Leahy Scale: Poor Sitting balance - Comments: pt was brought into sitting and positioned to get L foot on the floor, but she was unable to attain fully upright position, needing mod to max to sit uprigh. Postural control: Posterior lean                                  Cognition Arousal/Alertness: Awake/alert Behavior During Therapy: WFL for tasks assessed/performed Overall Cognitive Status: No family/caregiver present to determine baseline cognitive functioning(pt's cognition is impaired)                                 General Comments: per chart pt has dementia.  She is adamant that she can walk, but just cant at this moment      Exercises      General Comments        Pertinent Vitals/Pain Pain Assessment: Faces Faces Pain Scale: Hurts little more Pain Location: Grimace with attempts at moving RLE Pain Descriptors / Indicators: Grimacing;Guarding Pain Intervention(s): Monitored during session;Limited activity within patient's tolerance    Home Living                      Prior Function            PT Goals (current goals can now be found in the care plan  section) Acute Rehab PT Goals PT Goal Formulation: Patient unable to participate in goal setting Time For Goal Achievement: 02/06/20    Frequency    Min 2X/week      PT Plan Current plan remains appropriate    Co-evaluation              AM-PAC PT "6 Clicks" Mobility   Outcome Measure  Help needed turning from your back to your side while in a flat bed without using bedrails?: A Lot Help needed moving from lying on your back to sitting on the side of a flat bed without using bedrails?: Total Help needed moving to and from a bed to a chair (including a wheelchair)?: Total Help needed standing up from a chair using your arms (e.g., wheelchair or bedside chair)?: Total Help needed to walk in hospital room?: Total Help needed climbing 3-5 steps with a railing?  : Total 6 Click Score: 7    End of Session   Activity Tolerance: Patient limited by pain;Patient tolerated treatment well Patient left: in chair;with call bell/phone within reach;with chair alarm set Nurse Communication: Mobility status;Need for lift equipment PT Visit Diagnosis: Other abnormalities of gait and mobility (R26.89);History of falling (Z91.81);Muscle weakness (generalized) (M62.81);Difficulty in walking, not elsewhere classified (R26.2);Pain;Other symptoms and signs involving the nervous system (R29.898) Pain - part of body: Leg(bil)     Time: 1430-1500 PT Time Calculation (min) (ACUTE ONLY): 30 min  Charges:  $Therapeutic Activity: 23-37 mins                     01/27/2020  Ginger Carne., PT Acute Rehabilitation Services [phone]  (pager) [phone]  (office)   [name] 01/27/2020, 5:26 PM

## 2020-01-28 ENCOUNTER — Ambulatory Visit: Payer: Medicare Other | Admitting: Neurology

## 2020-10-24 ENCOUNTER — Emergency Department (HOSPITAL_COMMUNITY)

## 2020-10-24 ENCOUNTER — Emergency Department (HOSPITAL_COMMUNITY)
Admission: EM | Admit: 2020-10-24 | Discharge: 2020-10-24 | Disposition: A | Discharge status: Home / Self Care | Attending: Emergency Medicine | Admitting: Emergency Medicine

## 2020-10-24 DIAGNOSIS — Z87891 Personal history of nicotine dependence: Secondary | ICD-10-CM | POA: Insufficient documentation

## 2020-10-24 DIAGNOSIS — F0391 Unspecified dementia with behavioral disturbance: Secondary | ICD-10-CM | POA: Insufficient documentation

## 2020-10-24 DIAGNOSIS — R0989 Other specified symptoms and signs involving the circulatory and respiratory systems: Secondary | ICD-10-CM | POA: Diagnosis not present

## 2020-10-24 DIAGNOSIS — I10 Essential (primary) hypertension: Secondary | ICD-10-CM | POA: Insufficient documentation

## 2020-10-24 DIAGNOSIS — Z91040 Latex allergy status: Secondary | ICD-10-CM | POA: Diagnosis not present

## 2020-10-24 DIAGNOSIS — R531 Weakness: Secondary | ICD-10-CM | POA: Diagnosis present

## 2020-10-24 DIAGNOSIS — R630 Anorexia: Secondary | ICD-10-CM | POA: Insufficient documentation

## 2020-10-24 DIAGNOSIS — N390 Urinary tract infection, site not specified: Secondary | ICD-10-CM | POA: Insufficient documentation

## 2020-10-24 DIAGNOSIS — E119 Type 2 diabetes mellitus without complications: Secondary | ICD-10-CM | POA: Diagnosis not present

## 2020-10-24 DIAGNOSIS — Z20822 Contact with and (suspected) exposure to covid-19: Secondary | ICD-10-CM | POA: Diagnosis not present

## 2020-10-24 LAB — URINALYSIS, ROUTINE W REFLEX MICROSCOPIC
Glucose, UA: NEGATIVE mg/dL
Ketones, ur: 20 mg/dL — AB
Nitrite: POSITIVE — AB
Protein, ur: 100 mg/dL — AB
RBC / HPF: >50 RBC/hpf — ABNORMAL HIGH (ref 0–5)
Specific Gravity, Urine: 1.015 (ref 1.005–1.030)
WBC, UA: >50 WBC/hpf — ABNORMAL HIGH (ref 0–5)
pH: 6 (ref 5.0–8.0)

## 2020-10-24 LAB — CBC WITH DIFFERENTIAL/PLATELET
Abs Immature Granulocytes: 0.04 10*3/uL (ref 0.00–0.07)
Basophils Absolute: 0 10*3/uL (ref 0.0–0.1)
Basophils Relative: 0 %
Eosinophils Absolute: 0.4 10*3/uL (ref 0.0–0.5)
Eosinophils Relative: 5 %
HCT: 32.8 % — ABNORMAL LOW (ref 36.0–46.0)
Hemoglobin: 10 g/dL — ABNORMAL LOW (ref 12.0–15.0)
Immature Granulocytes: 1 %
Lymphocytes Relative: 19 %
Lymphs Abs: 1.4 10*3/uL (ref 0.7–4.0)
MCH: 22.6 pg — ABNORMAL LOW (ref 26.0–34.0)
MCHC: 30.5 g/dL (ref 30.0–36.0)
MCV: 74.2 fL — ABNORMAL LOW (ref 80.0–100.0)
Monocytes Absolute: 0.7 10*3/uL (ref 0.1–1.0)
Monocytes Relative: 10 %
Neutro Abs: 4.7 10*3/uL (ref 1.7–7.7)
Neutrophils Relative %: 65 %
Platelets: 260 10*3/uL (ref 150–400)
RBC: 4.42 MIL/uL (ref 3.87–5.11)
RDW: 15.8 % — ABNORMAL HIGH (ref 11.5–15.5)
WBC: 7.1 10*3/uL (ref 4.0–10.5)
nRBC: 0 % (ref 0.0–0.2)

## 2020-10-24 LAB — COMPREHENSIVE METABOLIC PANEL
ALT: 8 U/L (ref 0–44)
AST: 13 U/L — ABNORMAL LOW (ref 15–41)
Albumin: 2.9 g/dL — ABNORMAL LOW (ref 3.5–5.0)
Alkaline Phosphatase: 68 U/L (ref 38–126)
Anion gap: 14 (ref 5–15)
BUN: 19 mg/dL (ref 8–23)
CO2: 24 mmol/L (ref 22–32)
Calcium: 8.9 mg/dL (ref 8.9–10.3)
Chloride: 99 mmol/L (ref 98–111)
Creatinine, Ser: 0.83 mg/dL (ref 0.44–1.00)
GFR, Estimated: >60 mL/min (ref >60)
Glucose, Bld: 65 mg/dL — ABNORMAL LOW (ref 70–99)
Potassium: 3.3 mmol/L — ABNORMAL LOW (ref 3.5–5.1)
Sodium: 137 mmol/L (ref 135–145)
Total Bilirubin: 1.4 mg/dL — ABNORMAL HIGH (ref 0.3–1.2)
Total Protein: 7.5 g/dL (ref 6.5–8.1)

## 2020-10-24 LAB — TROPONIN I (HIGH SENSITIVITY)
Troponin I (High Sensitivity): 5 ng/L (ref <18)
Troponin I (High Sensitivity): 4 ng/L (ref <18)

## 2020-10-24 MED ORDER — SODIUM CHLORIDE 0.9 % IV BOLUS
1000.0000 mL | Freq: Once | INTRAVENOUS | Status: AC
  Administered 2020-10-24: 1000 mL via INTRAVENOUS

## 2020-10-24 MED ORDER — LEVOFLOXACIN IN D5W 750 MG/150ML IV SOLN
750.0000 mg | Freq: Once | INTRAVENOUS | Status: AC
  Administered 2020-10-24: 750 mg via INTRAVENOUS
  Filled 2020-10-24: qty 150

## 2020-10-24 MED ORDER — CEPHALEXIN 500 MG PO CAPS: 500.0000 mg | ORAL_CAPSULE | Freq: Four times a day (QID) | ORAL | 0 refills | Status: AC

## 2020-10-24 NOTE — Consult Note (Addendum)
Family Medicine Teaching Service Consult Note Service Pager: [phone]  Patient name: [name] Medical record number: [id number] Date of birth: 1946-03-11 Age: 75 y.o. Gender: female  Primary Care Provider: Doristine Bosworth, MD  Assessment and Plan: [name] is a 75 y.o. female presenting with decreased PO intake. PMH is significant for advanced dementia, hypertension, T2DM.   Poor PO Intake  Advanced Dementia  Hospice status Patient has a history of advanced dementia and is currently on hospice. Family members brought her in today due to decreased p.o. intake over the past 4 weeks. Caretakers report she has not been eating solids well ever since being diagnosed with COVID approximately 4 weeks ago. She will still drink water and Ensure throughout the day.  Patient appears well-hydrated on exam. Labs do not show any significant dehydration or kidney dysfunction.   Family medicine was consulted for admission.  Spoke with patient's daughter and son-in-law via telephone and discussed all potential options including admission vs discharge home.  They do not want aggressive interventions at this time and wish to continue hospice services.  They would not want feeding tube at any point and voiced understanding that her PO intake will likely continue to decline as she nears end-of-life.  They are agreeable to bringing patient back home, but would like to treat her urinary tract infection. -Patient will be discharged home with ongoing hospice services  -Hospice RN will visit home tomorrow -Will treat UTI as this could be contributing factor  Urinary Tract Infection Patient has chronic foley catheter in place. Urinalysis shows large leuks, positive nitrites, many bacteria, >50 WBCs and >50 RBCs. Despite her hx of dementia, she does report some abdominal pain, and therefore is likely symptomatic.  Patient is afebrile without leukocytosis.  -s/p one dose of IV Levaquin in the  ED -Family is interested in treating UTI -Will treat with 5 days of PO Keflex 500mg  QID (of note, patient has penicillin allergy documented, however she received Rocephin in the past without adverse effects) -Routine foley catheter care per hospice RN   History of Present Illness:  [name] is a 75 y.o. female presenting with decreased p.o. intake.  History is provided via telephone by patient's daughter and son-in-law. They report patient has not been eating well over the last 4 weeks ever since being diagnosed with COVID. She will still drink water and Ensure throughout the day, but has no appetite for other foods. They have no other specific concerns at this time.   Review Of Systems: Per HPI with the following additions:   Review of Systems  Unable to perform ROS: Dementia     Patient Active Problem List   Diagnosis Date Noted  . Syncope 01/21/2020  . H/O parotidectomy 07/02/2019  . Dementia with behavioral problem (HCC) 03/09/2018  . Edema 02/16/2018  . Physical deconditioning 04/04/2017  . Eczema 03/24/2017  . Decreased independence with activities of daily living 03/24/2017  . Toenail deformity 03/24/2017  . Decreased mobility and endurance 02/07/2017  . Cellulitis   . [place] scabies 12/18/2016  . Community acquired pneumonia of right lower lobe of lung   . Dehydration   . Failure to thrive in adult   . Impetigo   . Rash of entire body 12/06/2016  . Localized swelling, mass, or lump of lower extremity, bilateral 12/06/2016  . Constipation 03/24/2016  . Fall 02/04/2016  . Chronic anemia 02/04/2016  . Cognitive decline 02/04/2016  . Cellulitis of right arm 01/19/2016  .  Swelling of joint, knee, right 10/19/2015  . Venous insufficiency of both lower extremities, chronic 10/19/2015  . Bilateral lower extremity edema 05/24/2015  . Type 2 diabetes mellitus, controlled (HCC) 05/24/2015  . Malnutrition of moderate degree (HCC) 06/07/2014  . Acute  pericarditis, unspecified 06/07/2014  . Hypoglycemia 06/06/2014  . Fever 06/06/2014  . Knee pain, chronic 06/06/2014  . Mass of right side of neck 06/06/2014  . Severe protein-calorie malnutrition (HCC) 06/06/2014  . Microcytic anemia 06/06/2014    Past Medical History: Past Medical History:  Diagnosis Date  . Acute pericarditis, unspecified   . Arthritis   . Bilateral swelling of feet   . Cellulitis   . Chronic anemia   . Chronic knee pain    right  . Closed fracture of styloid process of ulna   . Cognitive decline   . Decreased appetite   . Diabetes mellitus    A1C has been normal for past 2 years and takes no diabetic meds at present  . Distal radius fracture 02/04/2016  . ECG abnormal   . Fall   . Hypertension   . Hypoglycemia   . Knee pain, chronic   . Left scapholunate ligament tear   . Lower extremity edema   . Mass of right side of neck   . Microcytic anemia   . Polio   . Radius fracture   . Severe protein-calorie malnutrition (HCC)   . Swelling of joint, knee, right   . Type 2 diabetes mellitus, controlled (HCC)   . Venous insufficiency     Past Surgical History: Past Surgical History:  Procedure Laterality Date  . ABDOMINAL HYSTERECTOMY    . OPEN REDUCTION INTERNAL FIXATION (ORIF) DISTAL RADIAL FRACTURE Left 02/29/2016   Procedure: LEFT DISTAL RADIUS REPAIR OF MALUNION WITH OPEN REDUCTION INTERNAL FIXATION (ORIF);  Surgeon: Bradly Bienenstock, MD;  Location: [facility] OR;  Service: Orthopedics;  Laterality: Left;  . PAROTIDECTOMY Right 07/02/2019   Procedure: RIGHT TOTAL PAROTIDECTOMY;  Surgeon: Newman Pies, MD;  Location: [facility];  Service: ENT;  Laterality: Right;    Social History: Social History   Tobacco Use  . Smoking status: Former Smoker    Packs/day: 0.50    Types: Cigarettes    Quit date: 09/09/1968    Years since quitting: 52.1  . Smokeless tobacco: Never Used  Substance Use Topics  . Alcohol use: No  . Drug use: No    Family  History: Family History  Problem Relation Age of Onset  . Ovarian cancer Mother   . COPD Father   . Hypertension Other   . High Cholesterol Other   . Diabetes Mellitus II Neg Hx   . Lupus Neg Hx   . Sarcoidosis Neg Hx     Allergies and Medications: Allergies  Allergen Reactions  . Orange Concentrate [Flavoring Agent] Shortness Of Breath, Itching and Swelling  . Peach [Prunus Persica] Shortness Of Breath, Itching and Swelling  . Penicillins Anaphylaxis and Hives    All 'cillins' Has patient had a PCN reaction causing immediate rash, facial/tongue/throat swelling, SOB or lightheadedness with hypotension: Yes Has patient had a PCN reaction causing severe rash involving mucus membranes or skin necrosis: No Has patient had a PCN reaction that required hospitalization No Has patient had a PCN reaction occurring within the last 10 years: No If all of the above answers are "NO", then may proceed with Cephalosporin use.   . Strawberry Extract Shortness Of Breath, Itching and Swelling  . Tylenol [Acetaminophen] Itching  Per patient makes her itch  . Adhesive [Tape] Itching and Rash    Please use "paper" tape  . Aspirin Itching  . Latex Itching and Rash  . Other Hives, Itching and Rash    NO "-CILLINS"  . Pineapple Itching and Rash  . Strawberry Flavor Itching and Rash   No current facility-administered medications on file prior to encounter.   Current Outpatient Medications on File Prior to Encounter  Medication Sig Dispense Refill  . acetaminophen (TYLENOL) 325 MG tablet Take 2 tablets (650 mg total) by mouth every 6 (six) hours as needed for mild pain (or Fever >/= 101).    . QUEtiapine (SEROQUEL) 50 MG tablet Take 0.5-1 tablets (25-50 mg total) by mouth at bedtime. 6 tablet 3    Objective: BP 104/69 (BP Location: Right Arm)   Pulse (!) 106   Temp 98.2 F (36.8 C) (Oral)   Resp 17   SpO2 99%  Exam: General: alert, NAD ENTM: moist mucous membranes Cardiovascular: RRR,  III/VI systolic murmur Respiratory: normal WOB on room air Gastrointestinal: soft, mild diffuse tenderness Neuro: oriented to self only  Labs and Imaging: CBC BMET  Recent Labs  Lab 10/24/20 1124  WBC 7.1  HGB 10.0*  HCT 32.8*  PLT 260   Recent Labs  Lab 10/24/20 1124  NA 137  K 3.3*  CL 99  CO2 24  BUN 19  CREATININE 0.83  GLUCOSE 65*  CALCIUM 8.9      DG Chest Port 1 View Result Date: 10/24/2020 IMPRESSION: Possible rounded density seen in left lung base with associated subsegmental atelectasis or scarring. CT scan of the chest is recommended for further evaluation.    Maury Dus, MD 10/24/2020, 8:20 PM PGY-1, [facility] Family Medicine FPTS Intern pager: [phone], text pages welcome  FPTS Upper-Level Resident Addendum   I have independently interviewed and examined the patient. I have discussed the above with the original author and agree with their documentation. My edits for correction/addition/clarification are in blue. Please see also any attending notes.    Mirian Mo MD PGY-3, [facility] Family Medicine 10/25/2020 2:35 PM  FPTS Service pager: [phone] (text pages welcome through [facility])

## 2020-10-24 NOTE — ED Notes (Signed)
Pt d/c by MD and is provided w/ d/c instructions, Pt is d/c by [facility] and family has been notified of pt arrival back home

## 2020-10-24 NOTE — Progress Notes (Signed)
Manufacturing engineer [facility])  [name] is a current hospice patient with [facility]. She was transferred to the ED by EMS per daughter for decreased appetite and decreased urine output. 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   At this time unsure if patient will be admitted. I have spoken to daughter [name] who is aware. Patient is being treated for UTI per daughter and started on Levaquin IV for treatment.   Please call if patient will be admitted to hospital and or discharged so [facility] can follow appropriately.   Thank you, Clementeen Hoof, BSN, [facility] liaison [phone]

## 2020-10-24 NOTE — ED Triage Notes (Signed)
Patient BIB [facility] from home. Patient daughter stated patient has a decreased appetite and decrease urine output for the last month and wanted her mother checked out. Patient has a foley. Patient is non ambulatory, Patient denies any pain, Patient had COVID x1 month ago.  Patient has a history of dementia and kidney failure.  EMS VS- 100/64, 99% on RA, T-98.5

## 2020-10-24 NOTE — ED Provider Notes (Signed)
[place] EMERGENCY DEPARTMENT Provider Note   CSN: [id number] Arrival date & time:        History Chief Complaint  Patient presents with  . Weakness    [name] is a 75 y.o. female.  Patient is a 75 year old female with history of diabetes, chronic renal insufficiency, hypertension, indwelling Foley catheter.  Patient sent here for evaluation of weakness.  According to the daughter, patient has had a month long history of decreased appetite, decreased p.o. intake, and is now having decreased urine output.  Patient is nonambulatory and has indwelling Foley catheter.  She did have Covid 1 month ago.  Patient denies to me that she is experiencing any discomfort.  She denies any fevers or chills.  Her only complaint is some congestion in her chest.  The history is provided by the patient.       Past Medical History:  Diagnosis Date  . Acute pericarditis, unspecified   . Arthritis   . Bilateral swelling of feet   . Cellulitis   . Chronic anemia   . Chronic knee pain    right  . Closed fracture of styloid process of ulna   . Cognitive decline   . Decreased appetite   . Diabetes mellitus    A1C has been normal for past 2 years and takes no diabetic meds at present  . Distal radius fracture 02/04/2016  . ECG abnormal   . Fall   . Hypertension   . Hypoglycemia   . Knee pain, chronic   . Left scapholunate ligament tear   . Lower extremity edema   . Mass of right side of neck   . Microcytic anemia   . Polio   . Radius fracture   . Severe protein-calorie malnutrition ([place])   . Swelling of joint, knee, right   . Type 2 diabetes mellitus, controlled ([place])   . Venous insufficiency     Patient Active Problem List   Diagnosis Date Noted  . Syncope 01/21/2020  . H/O parotidectomy 07/02/2019  . Dementia with behavioral problem ([place]) 03/09/2018  . Edema 02/16/2018  . Physical deconditioning 04/04/2017  . Eczema 03/24/2017  . Decreased  independence with activities of daily living 03/24/2017  . Toenail deformity 03/24/2017  . Decreased mobility and endurance 02/07/2017  . Cellulitis   . [place] scabies 12/18/2016  . Community acquired pneumonia of right lower lobe of lung   . Dehydration   . Failure to thrive in adult   . Impetigo   . Rash of entire body 12/06/2016  . Localized swelling, mass, or lump of lower extremity, bilateral 12/06/2016  . Constipation 03/24/2016  . Fall 02/04/2016  . Chronic anemia 02/04/2016  . Cognitive decline 02/04/2016  . Cellulitis of right arm 01/19/2016  . Swelling of joint, knee, right 10/19/2015  . Venous insufficiency of both lower extremities, chronic 10/19/2015  . Bilateral lower extremity edema 05/24/2015  . Type 2 diabetes mellitus, controlled ([place]) 05/24/2015  . Malnutrition of moderate degree ([place]) 06/07/2014  . Acute pericarditis, unspecified 06/07/2014  . Hypoglycemia 06/06/2014  . Fever 06/06/2014  . Knee pain, chronic 06/06/2014  . Mass of right side of neck 06/06/2014  . Severe protein-calorie malnutrition ([place]) 06/06/2014  . Microcytic anemia 06/06/2014    Past Surgical History:  Procedure Laterality Date  . ABDOMINAL HYSTERECTOMY    . OPEN REDUCTION INTERNAL FIXATION (ORIF) DISTAL RADIAL FRACTURE Left 02/29/2016   Procedure: LEFT DISTAL RADIUS REPAIR OF MALUNION WITH OPEN REDUCTION INTERNAL  FIXATION (ORIF);  Surgeon: Iran Planas, MD;  Location: [place];  Service: Orthopedics;  Laterality: Left;  . PAROTIDECTOMY Right 07/02/2019   Procedure: RIGHT TOTAL PAROTIDECTOMY;  Surgeon: Leta Baptist, MD;  Location: [place];  Service: ENT;  Laterality: Right;     OB History   No obstetric history on file.     Family History  Problem Relation Age of Onset  . Ovarian cancer Mother   . COPD Father   . Hypertension Other   . High Cholesterol Other   . Diabetes Mellitus II Neg Hx   . Lupus Neg Hx   . Sarcoidosis Neg Hx     Social History   Tobacco  Use  . Smoking status: Former Smoker    Packs/day: 0.50    Types: Cigarettes    Quit date: 09/09/1968    Years since quitting: 52.1  . Smokeless tobacco: Never Used  Substance Use Topics  . Alcohol use: No  . Drug use: No    Home Medications Prior to Admission medications   Medication Sig Start Date End Date Taking? Authorizing Provider  acetaminophen (TYLENOL) 325 MG tablet Take 2 tablets (650 mg total) by mouth every 6 (six) hours as needed for mild pain (or Fever >/= 101). 02/18/18   Geradine Girt, DO  QUEtiapine (SEROQUEL) 50 MG tablet Take 0.5-1 tablets (25-50 mg total) by mouth at bedtime. 01/26/20   Nita Sells, MD    Allergies    Orange concentrate [flavoring agent], Peach [prunus persica], Penicillins, Strawberry extract, Tylenol [acetaminophen], Adhesive [tape], Aspirin, Latex, Other, Pineapple, and Strawberry flavor  Review of Systems   Review of Systems  All other systems reviewed and are negative.   Physical Exam Updated Vital Signs There were no vitals taken for this visit.  Physical Exam Vitals and nursing note reviewed.  Constitutional:      General: She is not in acute distress.    Appearance: She is well-developed and well-nourished. She is not diaphoretic.     Comments: Patient is a chronically ill-appearing female in no acute distress.  She is awake, alert, and answers questions appropriately.  HENT:     Head: Normocephalic and atraumatic.     Mouth/Throat:     Mouth: Mucous membranes are moist.  Cardiovascular:     Rate and Rhythm: Normal rate and regular rhythm.     Heart sounds: No murmur heard. No friction rub. No gallop.   Pulmonary:     Effort: Pulmonary effort is normal. No respiratory distress.     Breath sounds: Normal breath sounds. No wheezing.  Abdominal:     General: Bowel sounds are normal. There is no distension.     Palpations: Abdomen is soft.     Tenderness: There is no abdominal tenderness.  Genitourinary:    Comments:  Foley catheter is in place draining what appears to be purulent material with particulate matter in the Foley tubing and Foley bag. Musculoskeletal:        General: No swelling or tenderness. Normal range of motion.     Cervical back: Normal range of motion and neck supple.     Right lower leg: No edema.     Left lower leg: No edema.  Skin:    General: Skin is warm and dry.  Neurological:     Mental Status: She is alert and oriented to person, place, and time.     ED Results / Procedures / Treatments   Labs (all labs ordered are  listed, but only abnormal results are displayed) Labs Reviewed - No data to display  EKG EKG Interpretation  Date/Time:  Tuesday October 24 2020 10:41:54 EST Ventricular Rate:  98 PR Interval:    QRS Duration: 107 QT Interval:  360 QTC Calculation: 460 R Axis:   2 Text Interpretation: Sinus rhythm Normal ECG Confirmed by Veryl Speak [phone]) on 10/24/2020 10:51:21 AM   Radiology No results found.  Procedures Procedures   Medications Ordered in ED Medications  sodium chloride 0.9 % bolus 1,000 mL (has no administration in time range)    ED Course  I have reviewed the triage vital signs and the nursing notes.  Pertinent labs & imaging results that were available during my care of the patient were reviewed by me and considered in my medical decision making (see chart for details).    MDM Rules/Calculators/A&P  Patient with extensive past medical history as described in the HPI.  Patient is currently on hospice.  She presents today for evaluation of weakness, decreased appetite, and decreased p.o. intake.  According to the daughter she has not had anything to eat for the past month.  She has only taken in limited liquids.  Patient's vital signs and electrolytes are essentially unremarkable.  Patient does have an indwelling Foley catheter with purulent material draining through the tubing.  Urinalysis is consistent with a UTI.  Patient to be  given IV antibiotics.  The daughter describes worsening overall condition and progressive decline over the past month.  She is uncertain as to whether or not she can care for her at home.  For these reasons and for treatment of UTI, I have consulted family medicine who will see and determine the final disposition.  Final Clinical Impression(s) / ED Diagnoses Final diagnoses:  None    Rx / DC Orders ED Discharge Orders    None       Veryl Speak, MD 10/25/20 2337

## 2020-10-24 NOTE — ED Notes (Signed)
This RN updated pt sister on pt condition and treatment plan

## 2020-10-25 LAB — SARS CORONAVIRUS 2 (TAT 6-24 HRS): SARS Coronavirus 2: NEGATIVE

## 2020-10-26 LAB — URINE CULTURE: Culture: >=100000 — AB
